# Patient Record
Sex: Female | Born: 1970 | Race: White | Hispanic: No | Marital: Single | Smoking: Current every day smoker
Health system: Southern US, Community
[De-identification: ages and names within clinical notes are randomized; demographics above are authoritative.]

## PROBLEM LIST (undated history)

## (undated) DIAGNOSIS — R454 Irritability and anger: Secondary | ICD-10-CM

## (undated) DIAGNOSIS — I351 Nonrheumatic aortic (valve) insufficiency: Secondary | ICD-10-CM

## (undated) DIAGNOSIS — K589 Irritable bowel syndrome without diarrhea: Secondary | ICD-10-CM

## (undated) DIAGNOSIS — E785 Hyperlipidemia, unspecified: Secondary | ICD-10-CM

## (undated) DIAGNOSIS — N760 Acute vaginitis: Secondary | ICD-10-CM

## (undated) DIAGNOSIS — D250 Submucous leiomyoma of uterus: Principal | ICD-10-CM

## (undated) DIAGNOSIS — F329 Major depressive disorder, single episode, unspecified: Secondary | ICD-10-CM

## (undated) DIAGNOSIS — D229 Melanocytic nevi, unspecified: Secondary | ICD-10-CM

## (undated) DIAGNOSIS — R519 Headache, unspecified: Secondary | ICD-10-CM

## (undated) DIAGNOSIS — R569 Unspecified convulsions: Secondary | ICD-10-CM

## (undated) DIAGNOSIS — F419 Anxiety disorder, unspecified: Secondary | ICD-10-CM

## (undated) DIAGNOSIS — F32A Depression, unspecified: Secondary | ICD-10-CM

## (undated) DIAGNOSIS — Z9289 Personal history of other medical treatment: Secondary | ICD-10-CM

## (undated) DIAGNOSIS — M542 Cervicalgia: Secondary | ICD-10-CM

## (undated) DIAGNOSIS — B9689 Other specified bacterial agents as the cause of diseases classified elsewhere: Secondary | ICD-10-CM

## (undated) DIAGNOSIS — Z9889 Other specified postprocedural states: Secondary | ICD-10-CM

## (undated) DIAGNOSIS — R110 Nausea: Secondary | ICD-10-CM

## (undated) DIAGNOSIS — K219 Gastro-esophageal reflux disease without esophagitis: Secondary | ICD-10-CM

## (undated) DIAGNOSIS — K529 Noninfective gastroenteritis and colitis, unspecified: Secondary | ICD-10-CM

## (undated) DIAGNOSIS — Z87442 Personal history of urinary calculi: Secondary | ICD-10-CM

## (undated) DIAGNOSIS — G8929 Other chronic pain: Secondary | ICD-10-CM

## (undated) DIAGNOSIS — R1031 Right lower quadrant pain: Principal | ICD-10-CM

## (undated) DIAGNOSIS — R197 Diarrhea, unspecified: Secondary | ICD-10-CM

## (undated) DIAGNOSIS — M549 Dorsalgia, unspecified: Secondary | ICD-10-CM

## (undated) DIAGNOSIS — R635 Abnormal weight gain: Secondary | ICD-10-CM

## (undated) DIAGNOSIS — R51 Headache: Secondary | ICD-10-CM

## (undated) DIAGNOSIS — J45909 Unspecified asthma, uncomplicated: Secondary | ICD-10-CM

## (undated) DIAGNOSIS — M1990 Unspecified osteoarthritis, unspecified site: Secondary | ICD-10-CM

## (undated) DIAGNOSIS — N898 Other specified noninflammatory disorders of vagina: Secondary | ICD-10-CM

## (undated) HISTORY — DX: Cervicalgia: M54.2

## (undated) HISTORY — DX: Gastro-esophageal reflux disease without esophagitis: K21.9

## (undated) HISTORY — DX: Acute vaginitis: N76.0

## (undated) HISTORY — DX: Dorsalgia, unspecified: M54.9

## (undated) HISTORY — PX: TUBAL LIGATION: SHX77

## (undated) HISTORY — DX: Other specified bacterial agents as the cause of diseases classified elsewhere: B96.89

## (undated) HISTORY — DX: Major depressive disorder, single episode, unspecified: F32.9

## (undated) HISTORY — DX: Abnormal weight gain: R63.5

## (undated) HISTORY — DX: Anxiety disorder, unspecified: F41.9

## (undated) HISTORY — DX: Hyperlipidemia, unspecified: E78.5

## (undated) HISTORY — DX: Irritability and anger: R45.4

## (undated) HISTORY — PX: CARDIAC CATHETERIZATION: SHX172

## (undated) HISTORY — DX: Melanocytic nevi, unspecified: D22.9

## (undated) HISTORY — DX: Diarrhea, unspecified: R19.7

## (undated) HISTORY — PX: BREAST ENHANCEMENT SURGERY: SHX7

## (undated) HISTORY — DX: Right lower quadrant pain: R10.31

## (undated) HISTORY — DX: Personal history of other medical treatment: Z92.89

## (undated) HISTORY — DX: Nausea: R11.0

## (undated) HISTORY — PX: KNEE SURGERY: SHX244

## (undated) HISTORY — DX: Unspecified osteoarthritis, unspecified site: M19.90

## (undated) HISTORY — DX: Submucous leiomyoma of uterus: D25.0

## (undated) HISTORY — DX: Noninfective gastroenteritis and colitis, unspecified: K52.9

## (undated) HISTORY — DX: Depression, unspecified: F32.A

## (undated) HISTORY — DX: Irritable bowel syndrome, unspecified: K58.9

## (undated) HISTORY — DX: Other specified noninflammatory disorders of vagina: N89.8

---

## 2001-03-13 ENCOUNTER — Other Ambulatory Visit: Admission: RE | Admit: 2001-03-13 | Discharge: 2001-03-13 | Payer: Self-pay

## 2003-11-04 ENCOUNTER — Emergency Department (HOSPITAL_COMMUNITY): Admission: EM | Admit: 2003-11-04 | Discharge: 2003-11-04 | Payer: Self-pay | Admitting: Emergency Medicine

## 2003-12-04 ENCOUNTER — Emergency Department (HOSPITAL_COMMUNITY): Admission: EM | Admit: 2003-12-04 | Discharge: 2003-12-04 | Payer: Self-pay | Admitting: Emergency Medicine

## 2004-04-23 ENCOUNTER — Emergency Department (HOSPITAL_COMMUNITY): Admission: EM | Admit: 2004-04-23 | Discharge: 2004-04-23 | Payer: Self-pay | Admitting: Emergency Medicine

## 2004-12-05 DIAGNOSIS — Z9889 Other specified postprocedural states: Secondary | ICD-10-CM

## 2004-12-05 DIAGNOSIS — I351 Nonrheumatic aortic (valve) insufficiency: Secondary | ICD-10-CM

## 2004-12-05 HISTORY — DX: Nonrheumatic aortic (valve) insufficiency: I35.1

## 2004-12-05 HISTORY — DX: Other specified postprocedural states: Z98.890

## 2005-05-23 ENCOUNTER — Emergency Department (HOSPITAL_COMMUNITY): Admission: EM | Admit: 2005-05-23 | Discharge: 2005-05-23 | Payer: Self-pay | Admitting: Emergency Medicine

## 2005-07-26 ENCOUNTER — Ambulatory Visit (HOSPITAL_COMMUNITY): Admission: RE | Admit: 2005-07-26 | Discharge: 2005-07-26 | Payer: Self-pay | Admitting: Preventative Medicine

## 2005-08-29 ENCOUNTER — Observation Stay (HOSPITAL_COMMUNITY): Admission: EM | Admit: 2005-08-29 | Discharge: 2005-08-30 | Payer: Self-pay | Admitting: Emergency Medicine

## 2005-09-12 ENCOUNTER — Ambulatory Visit: Payer: Self-pay | Admitting: Family Medicine

## 2005-09-28 ENCOUNTER — Ambulatory Visit (HOSPITAL_COMMUNITY): Admission: RE | Admit: 2005-09-28 | Discharge: 2005-09-28 | Payer: Self-pay | Admitting: *Deleted

## 2005-09-30 ENCOUNTER — Ambulatory Visit: Payer: Self-pay | Admitting: Family Medicine

## 2005-12-02 ENCOUNTER — Ambulatory Visit: Payer: Self-pay | Admitting: Family Medicine

## 2006-01-01 ENCOUNTER — Emergency Department (HOSPITAL_COMMUNITY): Admission: EM | Admit: 2006-01-01 | Discharge: 2006-01-01 | Payer: Self-pay | Admitting: Emergency Medicine

## 2006-01-11 ENCOUNTER — Ambulatory Visit (HOSPITAL_COMMUNITY): Admission: AD | Admit: 2006-01-11 | Discharge: 2006-01-12 | Payer: Self-pay | Admitting: Obstetrics and Gynecology

## 2006-02-26 ENCOUNTER — Ambulatory Visit (HOSPITAL_COMMUNITY): Admission: AD | Admit: 2006-02-26 | Discharge: 2006-02-26 | Payer: Self-pay | Admitting: Obstetrics and Gynecology

## 2006-05-17 ENCOUNTER — Ambulatory Visit (HOSPITAL_COMMUNITY): Admission: AD | Admit: 2006-05-17 | Discharge: 2006-05-17 | Payer: Self-pay | Admitting: Obstetrics and Gynecology

## 2006-06-06 ENCOUNTER — Ambulatory Visit (HOSPITAL_COMMUNITY): Admission: RE | Admit: 2006-06-06 | Discharge: 2006-06-06 | Payer: Self-pay | Admitting: Obstetrics and Gynecology

## 2006-06-30 ENCOUNTER — Observation Stay (HOSPITAL_COMMUNITY): Admission: AD | Admit: 2006-06-30 | Discharge: 2006-07-01 | Payer: Self-pay | Admitting: Obstetrics and Gynecology

## 2006-07-04 ENCOUNTER — Ambulatory Visit (HOSPITAL_COMMUNITY): Admission: AD | Admit: 2006-07-04 | Discharge: 2006-07-04 | Payer: Self-pay | Admitting: Obstetrics and Gynecology

## 2006-07-07 ENCOUNTER — Ambulatory Visit (HOSPITAL_COMMUNITY): Admission: AD | Admit: 2006-07-07 | Discharge: 2006-07-07 | Payer: Self-pay | Admitting: Obstetrics and Gynecology

## 2006-07-27 ENCOUNTER — Inpatient Hospital Stay (HOSPITAL_COMMUNITY): Admission: RE | Admit: 2006-07-27 | Discharge: 2006-07-29 | Payer: Self-pay | Admitting: Obstetrics and Gynecology

## 2007-04-23 ENCOUNTER — Ambulatory Visit: Payer: Self-pay | Admitting: Family Medicine

## 2007-07-31 ENCOUNTER — Ambulatory Visit (HOSPITAL_COMMUNITY): Admission: RE | Admit: 2007-07-31 | Discharge: 2007-07-31 | Payer: Self-pay | Admitting: Obstetrics and Gynecology

## 2007-08-23 ENCOUNTER — Ambulatory Visit (HOSPITAL_COMMUNITY): Admission: RE | Admit: 2007-08-23 | Discharge: 2007-08-23 | Payer: Self-pay | Admitting: Obstetrics and Gynecology

## 2007-10-24 ENCOUNTER — Emergency Department (HOSPITAL_COMMUNITY): Admission: EM | Admit: 2007-10-24 | Discharge: 2007-10-24 | Payer: Self-pay | Admitting: Emergency Medicine

## 2008-07-05 HISTORY — PX: TRANSTHORACIC ECHOCARDIOGRAM: SHX275

## 2008-07-15 DIAGNOSIS — Z9289 Personal history of other medical treatment: Secondary | ICD-10-CM

## 2008-07-15 HISTORY — DX: Personal history of other medical treatment: Z92.89

## 2009-04-08 ENCOUNTER — Other Ambulatory Visit: Admission: RE | Admit: 2009-04-08 | Discharge: 2009-04-08 | Payer: Self-pay | Admitting: Obstetrics and Gynecology

## 2009-05-06 ENCOUNTER — Ambulatory Visit (HOSPITAL_COMMUNITY): Admission: RE | Admit: 2009-05-06 | Discharge: 2009-05-06 | Payer: Self-pay | Admitting: Family Medicine

## 2009-06-05 ENCOUNTER — Ambulatory Visit (HOSPITAL_BASED_OUTPATIENT_CLINIC_OR_DEPARTMENT_OTHER): Admission: RE | Admit: 2009-06-05 | Discharge: 2009-06-05 | Payer: Self-pay | Admitting: Urology

## 2009-06-05 ENCOUNTER — Encounter (INDEPENDENT_AMBULATORY_CARE_PROVIDER_SITE_OTHER): Payer: Self-pay | Admitting: Urology

## 2011-03-05 ENCOUNTER — Emergency Department (HOSPITAL_COMMUNITY): Payer: Medicaid Other

## 2011-03-05 ENCOUNTER — Encounter (HOSPITAL_COMMUNITY): Payer: Self-pay | Admitting: Radiology

## 2011-03-05 ENCOUNTER — Emergency Department (HOSPITAL_COMMUNITY)
Admission: EM | Admit: 2011-03-05 | Discharge: 2011-03-06 | Disposition: A | Discharge status: Home / Self Care | Payer: Medicaid Other | Attending: Emergency Medicine | Admitting: Emergency Medicine

## 2011-03-05 DIAGNOSIS — N83209 Unspecified ovarian cyst, unspecified side: Secondary | ICD-10-CM | POA: Insufficient documentation

## 2011-03-05 DIAGNOSIS — K625 Hemorrhage of anus and rectum: Secondary | ICD-10-CM | POA: Insufficient documentation

## 2011-03-05 DIAGNOSIS — K219 Gastro-esophageal reflux disease without esophagitis: Secondary | ICD-10-CM | POA: Insufficient documentation

## 2011-03-05 DIAGNOSIS — R110 Nausea: Secondary | ICD-10-CM | POA: Insufficient documentation

## 2011-03-05 DIAGNOSIS — Z79899 Other long term (current) drug therapy: Secondary | ICD-10-CM | POA: Insufficient documentation

## 2011-03-05 DIAGNOSIS — R109 Unspecified abdominal pain: Secondary | ICD-10-CM | POA: Insufficient documentation

## 2011-03-05 LAB — PROTIME-INR: Prothrombin Time: 13.9 seconds (ref 11.6–15.2)

## 2011-03-05 LAB — URINALYSIS, ROUTINE W REFLEX MICROSCOPIC
Bilirubin Urine: NEGATIVE
Nitrite: NEGATIVE
Specific Gravity, Urine: 1.017 (ref 1.005–1.030)
pH: 6 (ref 5.0–8.0)

## 2011-03-05 LAB — CBC
HCT: 34.4 % — ABNORMAL LOW (ref 36.0–46.0)
MCH: 32.2 pg (ref 26.0–34.0)
MCHC: 34.3 g/dL (ref 30.0–36.0)
MCV: 93.7 fL (ref 78.0–100.0)
Platelets: 275 10*3/uL (ref 150–400)
RBC: 3.67 MIL/uL — ABNORMAL LOW (ref 3.87–5.11)
RDW: 12.6 % (ref 11.5–15.5)
WBC: 13.5 10*3/uL — ABNORMAL HIGH (ref 4.0–10.5)

## 2011-03-05 LAB — DIFFERENTIAL
Basophils Relative: 0 % (ref 0–1)
Lymphocytes Relative: 31 % (ref 12–46)
Lymphs Abs: 4.1 10*3/uL — ABNORMAL HIGH (ref 0.7–4.0)
Monocytes Absolute: 0.9 10*3/uL (ref 0.1–1.0)
Monocytes Relative: 7 % (ref 3–12)
Neutrophils Relative %: 60 % (ref 43–77)

## 2011-03-05 LAB — BASIC METABOLIC PANEL
BUN: 8 mg/dL (ref 6–23)
CO2: 21 mEq/L (ref 19–32)
Calcium: 8.3 mg/dL — ABNORMAL LOW (ref 8.4–10.5)
Creatinine, Ser: 0.75 mg/dL (ref 0.4–1.2)
GFR calc non Af Amer: >60 mL/min (ref >60)
Glucose, Bld: 92 mg/dL (ref 70–99)
Sodium: 134 mEq/L — ABNORMAL LOW (ref 135–145)

## 2011-03-05 LAB — HEPATIC FUNCTION PANEL
Indirect Bilirubin: 0.3 mg/dL (ref 0.3–0.9)
Total Bilirubin: 0.5 mg/dL (ref 0.3–1.2)

## 2011-03-05 LAB — APTT: aPTT: 29 seconds (ref 24–37)

## 2011-03-05 MED ORDER — IOHEXOL 300 MG/ML  SOLN
80.0000 mL | Freq: Once | INTRAMUSCULAR | Status: AC | PRN
  Administered 2011-03-05: 80 mL via INTRAVENOUS

## 2011-03-07 ENCOUNTER — Emergency Department (HOSPITAL_COMMUNITY)
Admission: EM | Admit: 2011-03-07 | Discharge: 2011-03-07 | Disposition: A | Discharge status: Home / Self Care | Payer: Medicaid Other | Attending: Emergency Medicine | Admitting: Emergency Medicine

## 2011-03-07 ENCOUNTER — Emergency Department (HOSPITAL_COMMUNITY): Payer: Medicaid Other

## 2011-03-07 DIAGNOSIS — K219 Gastro-esophageal reflux disease without esophagitis: Secondary | ICD-10-CM | POA: Insufficient documentation

## 2011-03-07 DIAGNOSIS — Z79899 Other long term (current) drug therapy: Secondary | ICD-10-CM | POA: Insufficient documentation

## 2011-03-07 DIAGNOSIS — R143 Flatulence: Secondary | ICD-10-CM | POA: Insufficient documentation

## 2011-03-07 DIAGNOSIS — R142 Eructation: Secondary | ICD-10-CM | POA: Insufficient documentation

## 2011-03-07 DIAGNOSIS — R141 Gas pain: Secondary | ICD-10-CM | POA: Insufficient documentation

## 2011-03-07 DIAGNOSIS — K602 Anal fissure, unspecified: Secondary | ICD-10-CM | POA: Insufficient documentation

## 2011-03-07 DIAGNOSIS — K625 Hemorrhage of anus and rectum: Secondary | ICD-10-CM | POA: Insufficient documentation

## 2011-03-07 DIAGNOSIS — R1032 Left lower quadrant pain: Secondary | ICD-10-CM | POA: Insufficient documentation

## 2011-03-07 DIAGNOSIS — F411 Generalized anxiety disorder: Secondary | ICD-10-CM | POA: Insufficient documentation

## 2011-03-07 DIAGNOSIS — K921 Melena: Secondary | ICD-10-CM | POA: Insufficient documentation

## 2011-03-07 DIAGNOSIS — D649 Anemia, unspecified: Secondary | ICD-10-CM | POA: Insufficient documentation

## 2011-03-07 DIAGNOSIS — K644 Residual hemorrhoidal skin tags: Secondary | ICD-10-CM | POA: Insufficient documentation

## 2011-03-07 LAB — CBC
HCT: 35.2 % — ABNORMAL LOW (ref 36.0–46.0)
Hemoglobin: 11.8 g/dL — ABNORMAL LOW (ref 12.0–15.0)
MCH: 32.1 pg (ref 26.0–34.0)
MCHC: 33.5 g/dL (ref 30.0–36.0)
MCV: 95.7 fL (ref 78.0–100.0)
RBC: 3.68 MIL/uL — ABNORMAL LOW (ref 3.87–5.11)

## 2011-03-07 LAB — COMPREHENSIVE METABOLIC PANEL
ALT: 17 U/L (ref 0–35)
Albumin: 3.4 g/dL — ABNORMAL LOW (ref 3.5–5.2)
Alkaline Phosphatase: 51 U/L (ref 39–117)
Calcium: 8.4 mg/dL (ref 8.4–10.5)
GFR calc Af Amer: >60 mL/min (ref >60)
Glucose, Bld: 100 mg/dL — ABNORMAL HIGH (ref 70–99)
Potassium: 4.4 mEq/L (ref 3.5–5.1)
Sodium: 140 mEq/L (ref 135–145)
Total Protein: 6.2 g/dL (ref 6.0–8.3)

## 2011-03-07 LAB — APTT: aPTT: 28 seconds (ref 24–37)

## 2011-03-07 LAB — DIFFERENTIAL
Basophils Relative: 0 % (ref 0–1)
Lymphs Abs: 3.5 10*3/uL (ref 0.7–4.0)
Monocytes Absolute: 0.8 10*3/uL (ref 0.1–1.0)
Monocytes Relative: 7 % (ref 3–12)
Neutro Abs: 7.4 10*3/uL (ref 1.7–7.7)
Neutrophils Relative %: 59 % (ref 43–77)

## 2011-03-07 LAB — PROTIME-INR
INR: 0.89 (ref 0.00–1.49)
Prothrombin Time: 12.2 seconds (ref 11.6–15.2)

## 2011-03-07 LAB — OCCULT BLOOD, POC DEVICE: Fecal Occult Bld: NEGATIVE

## 2011-03-07 LAB — TYPE AND SCREEN: ABO/RH(D): O POS

## 2011-03-22 ENCOUNTER — Ambulatory Visit: Payer: Medicaid Other | Admitting: Physical Therapy

## 2011-03-29 ENCOUNTER — Ambulatory Visit: Payer: Medicaid Other | Attending: Neurosurgery | Admitting: Physical Therapy

## 2011-03-29 DIAGNOSIS — R5381 Other malaise: Secondary | ICD-10-CM | POA: Insufficient documentation

## 2011-03-29 DIAGNOSIS — M545 Low back pain, unspecified: Secondary | ICD-10-CM | POA: Insufficient documentation

## 2011-03-29 DIAGNOSIS — IMO0001 Reserved for inherently not codable concepts without codable children: Secondary | ICD-10-CM | POA: Insufficient documentation

## 2011-03-29 DIAGNOSIS — R293 Abnormal posture: Secondary | ICD-10-CM | POA: Insufficient documentation

## 2011-03-31 ENCOUNTER — Ambulatory Visit: Payer: Medicaid Other | Admitting: Physical Therapy

## 2011-04-14 ENCOUNTER — Ambulatory Visit: Payer: Medicaid Other | Attending: Neurosurgery | Admitting: Physical Therapy

## 2011-04-14 DIAGNOSIS — M545 Low back pain, unspecified: Secondary | ICD-10-CM | POA: Insufficient documentation

## 2011-04-14 DIAGNOSIS — IMO0001 Reserved for inherently not codable concepts without codable children: Secondary | ICD-10-CM | POA: Insufficient documentation

## 2011-04-14 DIAGNOSIS — R293 Abnormal posture: Secondary | ICD-10-CM | POA: Insufficient documentation

## 2011-04-14 DIAGNOSIS — R5381 Other malaise: Secondary | ICD-10-CM | POA: Insufficient documentation

## 2011-04-19 ENCOUNTER — Ambulatory Visit: Payer: Medicaid Other | Admitting: Physical Therapy

## 2011-04-19 NOTE — Op Note (Signed)
Julie [name], Julie [name]               ACCOUNT NO.:  [id number]   MEDICAL RECORD NO.:  [id number]          PATIENT TYPE:  AMB   LOCATION:  DAY                           FACILITY:  [facility]   PHYSICIAN:  Tilda Burrow, M.D. DATE OF BIRTH:  09-May-1971   DATE OF PROCEDURE:  08/23/2007  DATE OF DISCHARGE:                               OPERATIVE REPORT   PROCEDURE:  Laparoscopic tubal sterilization with Falope rings.   SURGEON:  Tilda Burrow, M.D.   ASSISTANTAmie Critchley, CST.   ANESTHESIA:  General.   COMPLICATIONS:  None.   FINDINGS:  Thin fibrinous material around each tube and ovary suggestive  of old inflammatory process, not currently active.  Retraction of the  uterus on the left side from old surgical scars from her C-section.  Otherwise normal.   DETAILS OF PROCEDURE:  The patient was taken to the operating room,  prepped and draped in the usual standard fashion with legs in low  lithotomy leg supports after general anesthesia was introduced without  difficulty.  The bladder was in-and-out catheterized and Hulka tenaculum  attached to the cervix for uterine manipulation.  An infraumbilical,  vertical, 1-cm skin incision was made as well as a transverse suprapubic  1-cm incision.  A Veress needle was used to achieve pneumoperitoneum  through the umbilical incision while being careful to orient the needle  toward the pelvis while elevating the abdominal wall by manual  elevation.  Water droplet test was used to confirm intraperitoneal  placement.   Pneumoperitoneum was achieved easily under 8-to-10 mm of intra-abdominal  pressure; and the laparoscopic trocar was introduced, a 5-mm blunt  tipped trocar, under direct visualization using the video camera.  Peritoneal cavity was entered without difficulty.  Inspection of the  anterior surfaces of the abdominal contents showed no evidence of injury  or bleeding.  Attention was directed to the pelvis.  Findings were as  described above.   Attention was first directed to the left fallopian tube which was  elevated, identified to its fimbriated end and grasped in its midportion  with Falope ring applier.  Falope ring applied and then the tube  infiltrated with Marcaine solution 0.25% using a 22-gauge spinal needle  percutaneously applied.   Attention was then directed to the right fallopian tube where a similar  procedure was performed.  Photo documentation of the ring placements was  performed; 120 cc of saline was instilled into the abdomen; deflation of  CO2 performed; instruments removed and subcuticular 4-0 Dexon closure of  skin incisions performed.  The rest of the surgical instruments were  removed; Steri-Strips placed.  The patient allowed to awaken and go to  recovery room in standard fashion.      Tilda Burrow, M.D.  Electronically Signed     JVF/MEDQ  D:  08/23/2007  T:  08/23/2007  Job:  [id number]

## 2011-04-19 NOTE — Op Note (Signed)
[name], Julie               ACCOUNT NO.:  [id number]   MEDICAL RECORD NO.:  [id number]          PATIENT TYPE:  AMB   LOCATION:  NESC                         FACILITY:  [facility]   PHYSICIAN:  Sigmund I. Patsi Sears, M.D.DATE OF BIRTH:  1971/09/14   DATE OF PROCEDURE:  06/05/2009  DATE OF DISCHARGE:                               OPERATIVE REPORT   PREOPERATIVE DIAGNOSIS:  IC (interstitial cystitis).   POSTOPERATIVE DIAGNOSIS:  IC (interstitial cystitis).   PROCEDURE:  Cystourethroscopy, hydrodistention of bladder (950 mL), cold  cup bladder biopsy with cauterization of biopsy sites and installation  of Pyridium/Marcaine in the bladder with injection of Marcaine/Kenalog  in the subtrigonal space.   SURGEON:  Sigmund I. Patsi Sears, M.D.   ANESTHESIA:  General LMA, plus IV Toradol, plus B and O suppository.   PREPARATION:  After appropriate preanesthesia the patient was brought to  the operating room and placed on the operating room in the dorsal supine  position where general LMA anesthesia was induced.  She was then  replaced in dorsal lithotomy position when the pubis was prepped with  Betadine solution and draped in the usual fashion.   REVIEW OF HISTORY:  Julie [name] is a 40 year old female, referred for  evaluation of continued right lower quadrant pain despite negative  evaluation.  She has had pain since 2008.  In the past, she has had 2 to  3 mm stone with mild hydronephrosis and right ovarian cyst, but recent  CT shows no stone and minimal cyst.  There is no hydronephrosis noted.  She complains of urinary urgency, occasional frequency.  The patient has  a daughter with Crohn's disease and she herself has irritable bowel  syndrome (? Crohn's).  She is now for bladder evaluation for IC.   PROCEDURE IN DETAIL:  Cystourethroscopy shows a 950 mL bladder.  I do  not see ulcerations within the bladder.  Photo documentation was  accomplished.  Cold cup bladder biopsies were taken  and the biopsy sites  are cauterized.  Marcaine/ Pyridium solution was inserted in the  bladder, and Marcaine/Kenalog solution was injected into the subtrigonal  space.  The patient was given IV Toradol, B and O suppository, awakened  and taken to the recovery room in good condition.      Sigmund I. Patsi Sears, M.D.  Electronically Signed     SIT/MEDQ  D:  06/05/2009  T:  06/05/2009  Job:  [id number]

## 2011-04-19 NOTE — H&P (Signed)
Julie [name], Julie [name]               ACCOUNT NO.:  [id number]   MEDICAL RECORD NO.:  [id number]          PATIENT TYPE:  AMB   LOCATION:  DAY                           FACILITY:  [facility]   PHYSICIAN:  Tilda Burrow, M.D. DATE OF BIRTH:  [birth year]   DATE OF ADMISSION:  07/31/2007  DATE OF DISCHARGE:  08/26/2008LH                              HISTORY & PHYSICAL   ADMISSION DIAGNOSIS:  Desire for elective permanent sterilization.   HISTORY OF PRESENT ILLNESS:  This 40 year old female gravida 6, para 4,  status post C-section 2007, desires permanent sterilization.  She signed  Medicaid tubal sterilization forms in July, and returns for scheduling  of procedure.  It was previously scheduled and had to be rescheduled due  to patient's family concerns.  She  could not _keep scheduled surgery  date due to  __  the illness of her 40-year-old.  She was admitted for  __same-__ day surgery for laparoscopic tubal sterilization which has  been reviewed and questions answered.  Technical failure rate, technical  aspects of the procedure have been reviewed, and success rate of 99 out  of 100 reviewed.   PAST MEDICAL HISTORY:  Notable for occasional irregular heart rate.  She  has allergies to PENICILLIN which is a childhood allergy.  She has  bilateral breast complaints.  She has had D&C and C-section x2.   SOCIAL HISTORY:  Cigarettes one pack per day.  Alcohol denied.   PHYSICAL EXAMINATION:  GENERAL:  Exam shows a slim, petite athletic  Caucasian female.  VITAL SIGNS:  Weight 120, height 5 feet even.  Blood pressure 110/60,  pulse 70s.  HEENT:  Pupils equal, round and reactive.  NECK:  Supple.  Trachea is midline.  CHEST: Clear to auscultation.  ABDOMEN:  Nontender with well-healed surgical scar.  EXTERNAL GENITALIA: Multiparous.   PLAN:  LPS, fallopian rings, August 23, 2007.      Tilda Burrow, M.D.  Electronically Signed     JVF/MEDQ  D:  08/16/2007  T:  08/17/2007  Job:   [id number]

## 2011-04-22 NOTE — H&P (Signed)
NAMESRI, CLEGG               ACCOUNT NO.:  [id number]   MEDICAL RECORD NO.:  [id number]          PATIENT TYPE:  INP   LOCATION:  A206                          FACILITY:  [facility]   PHYSICIAN:  Calvert Cantor, M.D.     DATE OF BIRTH:  06/15/1971   DATE OF ADMISSION:  08/29/2005  DATE OF DISCHARGE:  LH                                HISTORY & PHYSICAL   ADMISSION DIAGNOSIS:  Chest pain and shortness of breath.   HISTORY OF PRESENT ILLNESS:  This is a 40 year old white female with a past  medical history of anxiety disorder and GERD who is complaining of pain in  the left side of her chest and shortness of breath on exertion.  In  addition, she states that she has had a cough for the past two days and this  morning she had an episode of hemoptysis.  The patient states that her chest  pain has been continuous since this morning.  It is an ache which is about  8/10.  It is not producible with pressure.  It does not radiate to her arms  or her shoulders and it is not associated with any diaphoresis or  palpitations.  There are no aggravating or relieving factors.  In addition,  she states she becomes mildly short of breath when she gets up and walks  around.  She does not feel short of breath at rest.  Her cough again has  been for the past two days.  She has not been coughing up any sputum.  It is  worse at night sometimes and she has never had any other episodes of  hemoptysis in the past.   REVIEW OF SYSTEMS:  Negative for fever, sore throat, orthopnea, paroxysmal  nocturnal dyspnea, abdominal pain, dysuria, diarrhea, night sweats,  weakness, weight loss or poor appetite, in fact the patient states that she  has a very good appetite.   PAST MEDICAL HISTORY:  1.  Gastroesophageal reflux disease.  2.  Anxiety.   CURRENT MEDICATIONS:  1.  Prevacid one tab daily.  2.  BuSpar 15 mg b.i.d., sometimes t.i.d.   ALLERGIES:  PENICILLIN HAS CAUSED HER TO PASS OUT.   SOCIAL HISTORY:  She  smokes one pack per day since she was 40 years old.  She drinks 5-6 beers on the weekend.  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  She has three children who are alive and healthy.   PAST SURGICAL HISTORY:  She has had breast implants.   FAMILY HISTORY:  Her mother is alive with no significant medical disease.  Her father is also alive with a history of diverticulitis.   PHYSICAL EXAMINATION:  VITAL SIGNS:  Temperature 97.4, blood pressure  117/60, pulse 60, respiratory rate 18.  Pulse oximetry is 100% on two liters  of O2.  HEENT:  Atraumatic, normocephalic.  Pupils equal, round, and reactive to  light.  Oral mucosa is moist.  There is no erythema.  There are no lesions.  NECK:  Supple.  No lymphadenopathy.  HEART:  Regular rate and rhythm.  No murmurs.  LUNGS:  Clear bilaterally.  ABDOMEN:  Soft, nontender, nondistended, bowel sounds are positive.  EXTREMITIES:  Show no clubbing, cyanosis, or edema.   LABORATORY DATA:  WBC count is 11.1, hemoglobin 12.8, hematocrit 37.3, MCV  92.9, platelets 368.  CK-MB is less than 1.0, troponin's are less than 0.05,  myoglobin is 31.8.  Sodium 133, potassium 3.6, chloride 104, bicarbonate 24,  glucose 98, BUN 5, creatinine 0.8, AST 26, ALT 18, alkaline phosphatase 64,  total bilirubin 1.3, total protein 6.7, calcium 8.8.   Chest x-ray:  Shows no cardiopulmonary process.  CT of the chest to rule out PE done in the ER: no evidence of a PE of any  other acute thoracic process.  There is a tiny nodular density in the  superior aspect of the left major fissure which is nonspecific.  There are  also minimal post inflammatory changes of the upper lobes.   EKG is showing normal sinus rhythm at 60 beats per minute.  There are no ST  or T-wave changes.  There are no Q-waves.   SUMMARY:  This is a 40 year old white female with a history of shortness of  breath and chest pain.  Cardiac enzymes will be done to rule out an MI.  Most likely  this is secondary to anxiety.  The patient will be placed on her  home medications.  In addition she can have Xanax p.r.n.   The patient has a history of hemoptysis.  Currently she does not appear to  have a PE, pneumonia or any masses in her lungs.  She seems to have post  inflammatory changes of the upper lobes.  Although she does not complain of  any fevers or weight loss, I will place a PPD to rule out TB.  I have told  the patient that if the hemoptysis continues she will probably need further  workup as an outpatient.      Calvert Cantor, M.D.  Electronically Signed     SR/MEDQ  D:  08/30/2005  T:  08/30/2005  Job:  [id number]

## 2011-04-22 NOTE — Consult Note (Signed)
[name], [name] NO.:  [id number]   MEDICAL RECORD NO.:  [id number]          PATIENT TYPE:  OIB   LOCATION:  LDR2                          FACILITY:  [facility]   PHYSICIAN:  Lazaro Arms, M.D.   DATE OF BIRTH:  18-Oct-1971   DATE OF CONSULTATION:  DATE OF DISCHARGE:  07/01/2006                                   CONSULTATION   HISTORY OF PRESENT ILLNESS:  Julie [name] is a 40 year old white female, gravida 6,  para 3, abortus 2, estimated date of delivery of August 11, 2006,  currently at [redacted] weeks gestation, who has had an unremarkable pregnancy.  Of  note, she does have a history of a 40 to 50% ejection fraction with mild  aortic insufficiency back in 2004, felt to be of no significant clinical  consequence except for prophylaxis during delivery.  She has a previous  cesarean section and we will proceed with a repeat cesarean section.  She  came into labor and delivery complaining of regular uterine contractions.  She mostly had uterine irritability with occasional stronger contractions.  Her cervix was basically long, thick, and closed from the office, and she  has been observed now for 14 to 15 hours with no change in the cervix.  She  had one dose of subcutaneous terbutaline, and she had significant __________  response to that, and then subsequently has had nothing else.  She had  Ambien for sleep.  This morning, I am going to give her a 2.5 mg of  terbutaline and send her home, and an as needed terbutaline of 2.5 mg, note  to be no more frequent than six hours.  She is given other instructions and  precautions to return, and she will be seen in the office per routine.  She  had a reactive NST.      Lazaro Arms, M.D.  Electronically Signed     LHE/MEDQ  D:  07/01/2006  T:  07/01/2006  Job:  [id number]

## 2011-04-22 NOTE — Procedures (Signed)
NAMEMALISSA, Julie [name]               ACCOUNT NO.:  [id number]   MEDICAL RECORD NO.:  [id number]          PATIENT TYPE:  OBV   LOCATION:  A206                          FACILITY:  [facility]   PHYSICIAN:  Dani Gobble, MD       DATE OF BIRTH:  October 14, 1971   DATE OF PROCEDURE:  08/30/2005  DATE OF DISCHARGE:  08/30/2005                                  ECHOCARDIOGRAM   REFERRING:  Dr. Butler Denmark and Dr. Domingo Sep.   INDICATIONS:  Chest pain.   Technical quality of this study is somewhat limited secondary to patient  body habitus and breast implants.   The aorta is measured within normal limits at 2.3 cm.   The left atrium is also within normal limits measured at 3.1 cm. The patient  appeared in sinus rhythm during this procedure. No obvious clots or masses  were appreciated.   The interventricular septum and posterior wall were within normal limits of  thickness at 1.0 cm for each.   The aortic valve was not well visualized but appeared to be grossly  structurally normal with normal leaflet excursion. In one view only (April  5), there was a subtle suggestion of either trivial eccentric jet of  __________ versus a small shunt. This was not well visualized. Doppler  interrogation of the aortic valve was within normal limits.   The mitral valve appears structurally normal. No mitral valve prolapse is  noted. Mild mitral regurgitation was noted. Doppler interrogation of the  mitral valve was within normal limits.   Pulmonic valve was not well visualized, but mild pulmonic insufficiency was  noted.   Tricuspid valve appeared grossly structurally normal with trivial tricuspid  regurgitation noted.   Left ventricle is normal in size. Overall left systolic function appeared to  be mildly diminished with an estimated ejection fraction of 45-50%. There  appeared to be mild global hypokinesis, although at times the anterior  septal wall appeared to be more hypokinetic than the remaining walls;  this  was not appreciated in all views.   There was no evidence for diastolic dysfunction on the inflow signal.   The right atrium and right ventricle normal in size and right ventricle  systolic function is normal.   IMPRESSION:  1.  Technically limited study secondary to patient body habitus and the      presence of breast implants.  2.  The aortic valve was not well visualized but appeared to be grossly      structurally normal.  3.  In one view only (apical five), there appeared to be a trivial eccentric      jet of aortic insufficiency, but the possibility that this was an aortic      fistula or shunt cannot be entirely excluded on this study.  4.  Mild mitral regurgitation.  5.  Trivial tricuspid regurgitation.  6.  Mild mild pulmonic insufficiency.  7.  Normal left ventricular size with mildly diminished ejection fraction      estimated 45-50%. There was mild global hypokinesis, although at times      the anterior septal wall  appeared to be more hypokinetic than the      remaining walls.  8.  Consider transesophageal echocardiogram if clinically indicated.           ______________________________  Dani Gobble, MD     AB/MEDQ  D:  08/30/2005  T:  08/31/2005  Job:  [id number]

## 2011-04-22 NOTE — Cardiovascular Report (Signed)
NAMESHANIKIA, KERNODLE NO.:  [id number]   MEDICAL RECORD NO.:  [id number]          PATIENT TYPE:  OIB   LOCATION:  2899                         FACILITY:  [facility]   PHYSICIAN:  Darlin Priestly, MD  DATE OF BIRTH:  1971/11/23   DATE OF PROCEDURE:  09/28/2005  DATE OF DISCHARGE:  09/28/2005                              CARDIAC CATHETERIZATION   PROCEDURE:  1.  Left heart catheterization.  2.  Coronary angiography.  3.  Left ventriculogram.  4.  Ascending aortography.   ATTENDING PHYSICIAN:  Darlin Priestly, M.D.   COMPLICATIONS:  None.   INDICATIONS FOR PROCEDURE:  Ms. Bixler is a 40 year old female patient of  Dr. Early Chars, Dr. Wende Crease, and Dr. Domingo Sep with a history of anxiety disorder,  history of gastroesophageal reflux disease, and ongoing chest pain.  She did  have a Cardiolite scan revealing increased good uptake as well as anterior  wall attenuation secondary to breast implants, but no significant ischemia.  She also had an echocardiogram revealing mild depressed EF with probable  aortic insufficiency.  Because of her ongoing symptoms, she is now referred  for cardiac catheterization to rule out significant CAD.   DESCRIPTION OF PROCEDURE:  After giving informed written consent, the  patient was brought to the cardiac cath lab were the right and left groin  were shaved, prepped and draped in a sterile fashion.  ECG monitoring was  established.  Using modified Seldinger technique, a #6 French arterial  sheath was inserted into the right femoral artery.  A 6 French diagnostic  catheter was used to perform diagnostic angiography.   The left main is a large vessel with no significant disease.   The LAD is a large vessel coursing to the apex and gives rise two diagonal  branches.  The LAD has no significant disease.  The first and second  diagonals are medium size vessels with no significant disease.   The left circumflex courses to the AV groove and  gives rise to one obtuse  marginal branch.  The circumflex has no significant disease.   The first obtuse marginal is a medium vessel that bifurcates distally with  no significant disease.   The right coronary artery is a large vessel and dominant and gives rise to a  PDA as well as posterolateral branch.  There is mild spasm noted in the  proximal portion of the RCA but no significant disease.  The PDA and  posterolateral branch have no significant disease.   Left ventriculogram reveals mild to moderate depressed EF of 35-40% with  global hypokinesis.   The ascending aortography reveals normal ascending aorta, though she does  appear to have mild to moderate aortic insufficiency.   HEMODYNAMICS:  Systemic arterial pressure 102/68, LV systemic pressure 98/3,  LVEDP 7.   CONCLUSION:  1.  No significant CAD.  2.  Mild to moderately depressed LV systolic function.  3.  Mild to moderate aortic insufficiency.      Darlin Priestly, MD  Electronically Signed     RHM/MEDQ  D:  09/28/2005  T:  09/28/2005  Job:  [id number]   cc:   Dorthula Rue. Early Chars, MD  Fax: [phone]   Laverle Hobby, M.D.  [address]  [place], [place] [address]   Dani Gobble, MD  Fax: [phone]

## 2011-04-22 NOTE — H&P (Signed)
Julie [name], Julie [name]               ACCOUNT NO.:  [id number]   MEDICAL RECORD NO.:  [id number]          PATIENT TYPE:  AMB   LOCATION:  DAY                           FACILITY:  [facility]   PHYSICIAN:  Tilda Burrow, M.D. DATE OF BIRTH:  12/27/1970   DATE OF ADMISSION:  07/27/2006  DATE OF DISCHARGE:  LH                                HISTORY & PHYSICAL   ADMISSION DIAGNOSES:  1. Pregnancy, [redacted] weeks gestation, repeat cesarean section, not for trial      of labor.  2. History of mild tricuspid regurgitation, for IV antibiotic prophylaxis      at surgery.   HISTORY OF PRESENT ILLNESS:  This pleasant 40 year old female, gravida 6,  para 3, AB 2, with three living children, who is admitted at this time for a  repeat cesarean section.  Julie [name] has been followed through the pregnancy with  lots of discomfort and pelvic pressure, tolerating poorly by the patient.  She was originally scheduled for surgery on July 31, 2006 but due to the  patient's emphatic concerns over discomfort, pressure, and family, she is  going to be going into labor at any point.  She has been moved up to [redacted]  weeks gestation.  She understands that not waiting until 39 weeks increases,  essentially doubling the small risk of the baby not breathing well at  delivery.  She acknowledges this, with this having been explained in detail  but emphatically wishes to proceed as soon as possible for cesarean  delivery.  The pregnancy course has been notable for past medical history of  GERD, anxiety and panic disorder.  She has had some chest discomfort which  has been evaluated in the past as well as during the pregnancy.  In October,  2006, she had angiography, revealing mild-to-moderate aortic insufficiency  with an ejection fraction at 35-40% with what was termed global  hypokinesis.  Six months later, echo was scheduled, at which time she was  pregnant.  This evaluation did not include an echocardiogram.  The plans are  for  echo to be repeated six months or so after delivery.  She was not  perceived to be a cardiac risk during the pregnancy.  She will receive IV  antibiotic prophylaxis.   PAST MEDICAL HISTORY:  Positive for irregular heartbeat, previous, as  evaluated and seen in the HPI.  Marland Kitchen   PAST SURGICAL HISTORY:  D&C, C-section, breast implants bilaterally.   ALLERGIES:  PENICILLIN.   SOCIAL HISTORY:  Single.  Works at [employer].   Physical exam shows a healthy Caucasian female, tolerating labor adequately.   PRENATAL LABS:  Blood type O+.  Rubella immune.  Hemoglobin stable.  Hepatitis, HIV, RPR, GC and Chlamydia all negative.  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  No current lesions noted.   PLAN:  Repeat cesarean section on July 27, 2006.  Will use antibiotic  prophylaxis after clamping of the cord.      Tilda Burrow, M.D.  Electronically Signed     JVF/MEDQ  D:  07/26/2006  T:  07/26/2006  Job:  [id number]  cc:   Francoise Schaumann. Milford Cage DO, FAAP  Fax: [phone]

## 2011-04-22 NOTE — Discharge Summary (Signed)
NAMEGWYNETH, Julie [name]               ACCOUNT NO.:  [id number]   MEDICAL RECORD NO.:  [id number]          PATIENT TYPE:  OIB   LOCATION:  A428                          FACILITY:  [facility]   PHYSICIAN:  Tilda Burrow, M.D. DATE OF BIRTH:  1971/08/23   DATE OF ADMISSION:  01/11/2006  DATE OF DISCHARGE:  02/08/2007LH                                 DISCHARGE SUMMARY   ADMISSION DIAGNOSES:  Pregnancy 9 weeks, 5 days gestation, right upper  quadrant pain x2 days.  Rule out appendicitis.   DISCHARGE DIAGNOSES:  Pregnancy 9 weeks, 5 days, stable.  No evidence of  appendicitis.  Possible mesenteric adenitis versus gastroenteritis.   PROCEDURES:  Consult with Elpidio Anis January 12, 2006.   DISCHARGE INSTRUCTIONS:  1.  Full liquids x24 hours.  2.  Tylenol #3 p.r.n. pain.  3.  Return call to [facility] for fever, return of abdominal pain,      change in status.   HOSPITAL SUMMARY:  This 40 year old female, gravida 6, para 3, at 9 weeks 5  days, admitted after being evaluated in the office with right lower quadrant  pain, nausea, loose stools but without diarrhea.  She had some right lower  quadrant pain, possibly representing McBurney's point.   HOSPITAL COURSE:  The patient was admitted with laboratory data initially  including white count 13,600, 66 neutrophils, 23 lymphs, hemoglobin 12,  hematocrit 34.  Ultrasound was performed which showed no ultrasound evidence  of appendicitis.  MRI was ordered by Dr. Katrinka [name] after consultation and  discussion with Julie [name] and Dr. Despina Hidden.  MRI was interpreted as  negative for any suggestion of appendicitis.  The patient was tolerating  liquids, requesting to go home by the evening of January 12, 2006, and was  discharged at that time.  Follow up four days [facility].      Tilda Burrow, M.D.  Electronically Signed    JVF/MEDQ  D:  01/12/2006  T:  01/13/2006  Job:  [id number]   cc:   Julie Dress. Julie [name], M.D.  Fax: [phone]   [facility]

## 2011-04-22 NOTE — Group Therapy Note (Signed)
Julie [name], Julie [name]               ACCOUNT NO.:  [id number]   MEDICAL RECORD NO.:  [id number]          PATIENT TYPE:  OIB   LOCATION:  A415                          FACILITY:  [facility]   PHYSICIAN:  Richardean Canal, M.D.  DATE OF BIRTH:  1971/10/22   DATE OF PROCEDURE:  07/07/2006  DATE OF DISCHARGE:  07/07/2006                                   PROGRESS NOTE   This patient was sent to labor and deliver from the office at [redacted] weeks  gestation with uterine contractions.  The patient has a history of a prior  cesarean section.  The patient was placed on a monitor in labor and delivery  where she was noted to have a reactive NST.  Contractions were occurring  ever 1.5 minutes.  Procardia 20 mg orally was prescribed and the  contractions abated.  The patient was seen following this and found to be  comfortable and ready for discharge home.   DIAGNOSES:  1.  Pregnancy 34 weeks.  2.  History of prior cesarean section.  3.  False labor.   DISPOSITION:  1.  Discharged home.  2.  Procardia 10 mg, number 28, to be taken 1 p.r.n. contractions not to      exceed four per day.           ______________________________  Richardean Canal, M.D.     RW/MEDQ  D:  07/07/2006  T:  07/08/2006  Job:  [id number]

## 2011-04-22 NOTE — Group Therapy Note (Signed)
NAMEIRINE, HEMINGER               ACCOUNT NO.:  [id number]   MEDICAL RECORD NO.:  [id number]          PATIENT TYPE:  OIB   LOCATION:  A415                          FACILITY:  [facility]   PHYSICIAN:  Tilda Burrow, M.D. DATE OF BIRTH:  1971/06/16   DATE OF PROCEDURE:  07/04/2006  DATE OF DISCHARGE:  07/04/2006                                   PROGRESS NOTE   Arlesia rested quite well after her morphine injection and quit having  contractions.  Her cervix is unchanged.  She has been observed for about six  hours now and is being discharged home.      Jacklyn Shell, C.N.M.      Tilda Burrow, M.D.  Electronically Signed    FC/MEDQ  D:  07/04/2006  T:  07/04/2006  Job:  [id number]   cc:   [facility]

## 2011-04-22 NOTE — Op Note (Signed)
Julie [name], Julie [name]               ACCOUNT NO.:  [id number]   MEDICAL RECORD NO.:  [id number]          PATIENT TYPE:  INP   LOCATION:  A410                          FACILITY:  [facility]   PHYSICIAN:  Tilda Burrow, M.D. DATE OF BIRTH:  1971-08-28   DATE OF PROCEDURE:  DATE OF DISCHARGE:                                 OPERATIVE REPORT   PREOPERATIVE DIAGNOSIS:  Pregnancy at 69 weeks' gestation, for repeat  cesarean section, not for trial of labor.   POSTOPERATIVE DIAGNOSIS:  Pregnancy at 58 weeks' gestation, for repeat  cesarean section, not for trial of labor.   PROCEDURE:  Repeat low transverse cervical cesarean section.   SURGEON:  Tilda Burrow, M.D.   ASSISTANT:  None.   ANESTHESIA:  Spinal.   COMPLICATIONS:  None.   FINDINGS:  Healthy female infant, Apgars 9/9.   ESTIMATED BLOOD LOSS:  750 mL.   DETAILS OF PROCEDURE:  The patient was taken to the operating room, prepped  and draped.  Lower abdominal incision was repeated with excision of old  cicatrix.  Bladder flap was developed on the lower uterine segment without  difficulty using sterile technique.  A transverse uterine incision was made  via knife opening of the lower uterine segment, index finger traction  transversely to extend the incision and then the fetal vertex rotated into  the incision and delivered with fundal pressure.  Cord bloods samples were  obtained after the cord was clamped and the infant placed in the care of Dr.  Ara Kussmaul; see his notes for further details on the infant.  Cord blood  gases are documented elsewhere.   The placenta delivered intact, Schultze presentation, and then the patient  had the uterine irrigation with saline solution, closure of the uterine  cavity with running-locking 0 chromic in a single layer, 2-0 chromic closure  of the bladder flap.  The abdomen was irrigated with saline solution.  Anterior peritoneum closed with 2-0 chromic, fascia  closed with 0 Vicryl,  subcutaneous tissue were reapproximated with  interrupted 2-0 plain and staple closure of the skin completed the  procedure.   ESTIMATED BLOOD LOSS:  750      Tilda Burrow, M.D.  Electronically Signed     JVF/MEDQ  D:  07/28/2006  T:  07/28/2006  Job:  [id number]   cc:   [facility]   Francoise Schaumann. Milford Cage DO, FAAP  Fax: [phone]

## 2011-04-22 NOTE — H&P (Signed)
NAMEDIVINITY, KYLER NO.:  [id number]   MEDICAL RECORD NO.:  [id number]          PATIENT TYPE:  OIB   LOCATION:  A415                          FACILITY:  [facility]   PHYSICIAN:  Richardean Canal, M.D.  DATE OF BIRTH:  07/04/1971   DATE OF ADMISSION:  07/07/2006  DATE OF DISCHARGE:  08/03/2007LH                                HISTORY & PHYSICAL   This is a 40 year old white female gravida 7, para 3, C-section 1, with  consensus EDC August 11, 2006.  Admitted at [redacted] weeks gestation with  preterm contractions.   On admission, the patient was placed on the monitor and noted to be having  irregular uterine contractions.  The fetal heart showed good variability and  was a reactive NST.  There were no decelerations.   The patient was examined, and further history taken.  The patient stated  that terbutaline gave her an unpleasant reaction, and it was agreed that she  would not receive terbutaline.  The patient received Procardia 20 mg p.o.  and contractions subsided with one dose of Procardia.  The patient was  discharged home in satisfactory condition.   FINAL DIAGNOSIS:  Intrauterine gestation, 35 weeks, history of prior  cesarean section.  False labor.   DISPOSITION:  Discharge home.  Procardia 10 mg #20, to be taken one as  needed, not to exceed four per day.  Follow up with prenatal care with  [facility].                                            ______________________________  Richardean Canal, M.D.     RW/MEDQ  D:  07/09/2006  T:  07/09/2006  Job:  [id number]

## 2011-04-22 NOTE — Group Therapy Note (Signed)
[name], [name]               ACCOUNT NO.:  [id number]   MEDICAL RECORD NO.:  [id number]          PATIENT TYPE:  OIB   LOCATION:  A415                          FACILITY:  [facility]   PHYSICIAN:  Lazaro Arms, M.D.   DATE OF BIRTH:  02/09/1971   DATE OF PROCEDURE:  07/04/2006  DATE OF DISCHARGE:                                   PROGRESS NOTE   Julie [name] is 34 weeks 4 days gestation who came to my office today complaining  of increased contractions.  She was seen this weekend for contractions and  received Brethine to which she responded to; however, she does have a  history of anxiety and some heart issues where her ejection fraction is 45-  50% so the side effect of increased heart rate and anxiety was just too much  for her to handle.  Her cervix is essentially unchanged, 1 cm, 50% effaced,  -1 station.  She is having contractions that are palpable about every two to  four minutes or so.  Fetal heart rate is reactive without decelerations.  At  this point we will try conservative measures to quiet her uterus.  She just  received an IM injection of 14 mg of morphine sulfate.  If this does not  quiet her down will try some IV hydration.      Julie [name], C.N.M.      Lazaro Arms, M.D.  Electronically Signed    FC/MEDQ  D:  07/04/2006  T:  07/04/2006  Job:  [id number]   cc:   [facility]

## 2011-04-22 NOTE — Consult Note (Signed)
[name], [name]               ACCOUNT NO.:  [id number]   MEDICAL RECORD NO.:  [id number]          PATIENT TYPE:  OIB   LOCATION:  LDR3                          FACILITY:  [facility]   PHYSICIAN:  Tilda Burrow, M.D. DATE OF BIRTH:  04/28/1971   DATE OF CONSULTATION:  02/26/2005  DATE OF DISCHARGE:  02/26/2006                                   CONSULTATION   CHIEF COMPLAINT:  Lower abdominal pain at [redacted] weeks gestation.   HISTORY OF PRESENT ILLNESS:  This 40 year old female G4 P3 at [redacted] weeks  gestation presents with lower abdominal discomfort without bleeding or  gastric fluid.  The patient is concerned with the suprapubic discomfort with  no regular contractions noted.  She has had no recent trauma or injury.  The  pregnancy course has been notable for slightly increased Down's syndrome  risk with scheduled referral this week to [facility] for a targeted  ultrasound for Down's Syndrome Risk Increase.  The patient denies nausea or  vomiting, diarrhea.   PHYSICAL EXAM:  Abdominal exam shows vague discomfort over the uterine area  with no guarding noted, upper abdomen is completely nontender.  Internal  exam shows a firm cervix as consistent with a prior conization of the  cervix.  There is no cervical dilation.  The uterus is mildly sensitive to  touch 1+ out 1/4, plus tender with bladder specifically less tender than the  uterus.   IMPRESSION:  Nonspecific discomforts of pregnancy.  No evidence of acute  problem.  Check CBC with white count 14,700, 69 neutrophils noted.  Urinalysis performed completely negative for any signs of infection.   PLAN:  Symptomatic analgesics with Vicodin 5/500 x20 tablets, refill x1  written.  Follow up p.r.n. our office.      Tilda Burrow, M.D.  Electronically Signed     JVF/MEDQ  D:  02/26/2006  T:  03/01/2006  Job:  [id number]

## 2011-04-22 NOTE — H&P (Signed)
Julie [name], Julie [name]               ACCOUNT NO.:  [id number]   MEDICAL RECORD NO.:  [id number]          PATIENT TYPE:  OIB   LOCATION:  A418                          FACILITY:  [facility]   PHYSICIAN:  Tilda Burrow, M.D. DATE OF BIRTH:  January 20, 1971   DATE OF ADMISSION:  01/11/2006  DATE OF DISCHARGE:  LH                                HISTORY & PHYSICAL   HISTORY OF PRESENT ILLNESS:  Julie [name] is a 40 year old, gravida 6, para 3, who  is 9 weeks and 5 days gestation who presents to the office with lower right  quadrant pain yesterday.  She states that she was nauseated all day, could  not eat anything and had loose stools but no diarrhea stools.  On exam, she  does have some right lower quadrant tenderness which is too high for ovarian  pain.   PAST MEDICAL HISTORY:  Positive for occasional arrhythmia.   PAST SURGICAL HISTORY:  1.  Breast implants.  2.  Dilatation and curettage.  3.  Cesarean section.   ALLERGIES:  PENICILLIN.   PRENATAL COURSE:  Uneventful up to this point.   PHYSICAL EXAMINATION:  VITAL SIGNS: Stable.  ABDOMEN:  She does have some rebound tenderness and with standing on her  tiptoes and relaxing back down, she does have pain in the right lower  quadrant.   LABORATORY DATA:  A CBC with differential was obtained stat.  Will get an  ultrasound to rule out appendicitis and Hep-Lock her to manage. Give her IV  narcotics to manage her pain and discomfort and consult Dr. Michaelle Copas office  in regards to evaluation of this patient.      Zerita Boers, Lanier Clam      Tilda Burrow, M.D.  Electronically Signed    DL/MEDQ  D:  98/10/9146  T:  01/11/2006  Job:  [id number]   cc:   Dirk Dress. Katrinka Blazing, M.D.  Fax: [phone]

## 2011-04-22 NOTE — Discharge Summary (Signed)
NAMECRYSTEN, KAMAN               ACCOUNT NO.:  [id number]   MEDICAL RECORD NO.:  [id number]          PATIENT TYPE:  INP   LOCATION:  A410                          FACILITY:  [facility]   PHYSICIAN:  Lazaro Arms, M.D.   DATE OF BIRTH:  05/12/1971   DATE OF ADMISSION:  07/27/2006  DATE OF DISCHARGE:  08/25/2007LH                                 DISCHARGE SUMMARY   DISCHARGE DIAGNOSES:  1. Status post repeat cesarean section.  2. Unremarkable postoperative course.   PROCEDURE:  Repeat cesarean section.   Please refer to the history and physical on the antepartum chart for details  of admission to the hospital.   HOSPITAL COURSE:  The patient is admitted postoperatively.  She tolerated  clear liquids and a regular diet.  She voided without symptoms, was  extensively ambulatory going in to smoke several times a day.  Her  postoperative hemoglobin and hematocrit was 9.2 and 27.0 which were stable.  She remained afebrile throughout the postoperative course.   The incision was clean, dry and intact.  She was discharged to home on the  morning of postoperative day two in good stable condition to follow up in  the office on the 29th to have her staples removed.  She was discharged to  home on Tylox #40 and Motrin #20.  We will give her instruction precautions  for contacting the office prior to her scheduled visit.      Lazaro Arms, M.D.  Electronically Signed     LHE/MEDQ  D:  07/29/2006  T:  07/29/2006  Job:  [id number]

## 2011-04-22 NOTE — Group Therapy Note (Signed)
NAMETERESHA, HANKS NO.:  [id number]   MEDICAL RECORD NO.:  [id number]          PATIENT TYPE:  OIB   LOCATION:  LDR2                          FACILITY:  [facility]   PHYSICIAN:  Tilda Burrow, M.D. DATE OF BIRTH:  [birth year]   DATE OF PROCEDURE:  DATE OF DISCHARGE:  07/01/2006                                   PROGRESS NOTE   Julie [name] called the birthing center at approximately 1500.  She had been  observed overnight for preterm contractions without cervical change.  She  was 34 weeks 1 day gestation.  She was given a prescription for Brethine 2.5  mg to take p.o. for contractions.  She states that she simply cannot take  the medicine as the side-effect of the jitteriness and heart rate increase  is just too much for her as she already suffers from anxiety.  I advised her  to try other measures for which to keep her uterus quiet such as hydration,  warm bath and rest, however, to follow up with labor and delivery if she  starts having contractions that are more frequent or stronger in character.      Julie [name], C.N.M.      Tilda Burrow, M.D.  Electronically Signed    FC/MEDQ  D:  07/01/2006  T:  07/01/2006  Job:  [id number]   cc:   [facility]

## 2011-05-10 ENCOUNTER — Ambulatory Visit (INDEPENDENT_AMBULATORY_CARE_PROVIDER_SITE_OTHER): Payer: Medicaid Other | Admitting: Internal Medicine

## 2011-05-10 DIAGNOSIS — R197 Diarrhea, unspecified: Secondary | ICD-10-CM

## 2011-05-10 DIAGNOSIS — K625 Hemorrhage of anus and rectum: Secondary | ICD-10-CM

## 2011-05-20 ENCOUNTER — Encounter (INDEPENDENT_AMBULATORY_CARE_PROVIDER_SITE_OTHER): Payer: Medicaid Other | Admitting: Internal Medicine

## 2011-05-27 ENCOUNTER — Encounter (HOSPITAL_BASED_OUTPATIENT_CLINIC_OR_DEPARTMENT_OTHER): Payer: Medicaid Other | Admitting: Internal Medicine

## 2011-05-27 ENCOUNTER — Ambulatory Visit (HOSPITAL_COMMUNITY)
Admission: RE | Admit: 2011-05-27 | Discharge: 2011-05-27 | Disposition: A | Discharge status: Home / Self Care | Payer: Medicaid Other | Source: Ambulatory Visit | Attending: Internal Medicine | Admitting: Internal Medicine

## 2011-05-27 ENCOUNTER — Other Ambulatory Visit (INDEPENDENT_AMBULATORY_CARE_PROVIDER_SITE_OTHER): Payer: Self-pay | Admitting: Internal Medicine

## 2011-05-27 DIAGNOSIS — R197 Diarrhea, unspecified: Secondary | ICD-10-CM

## 2011-05-27 DIAGNOSIS — K644 Residual hemorrhoidal skin tags: Secondary | ICD-10-CM

## 2011-05-27 DIAGNOSIS — K625 Hemorrhage of anus and rectum: Secondary | ICD-10-CM

## 2011-05-27 DIAGNOSIS — D126 Benign neoplasm of colon, unspecified: Secondary | ICD-10-CM

## 2011-05-27 DIAGNOSIS — R109 Unspecified abdominal pain: Secondary | ICD-10-CM | POA: Insufficient documentation

## 2011-05-27 HISTORY — PX: COLONOSCOPY: SHX174

## 2011-06-05 NOTE — Op Note (Signed)
  NAMEJEANANN, BALINSKI               ACCOUNT NO.:  [id number]  MEDICAL RECORD NO.:  [id number]  LOCATION:  DAYP                          FACILITY:  [facility]  PHYSICIAN:  Lionel December, M.D.    DATE OF BIRTH:  June 22, 1971  DATE OF PROCEDURE:  05/27/2011 DATE OF DISCHARGE:                              OPERATIVE REPORT   PROCEDURE:  Colonoscopy with snare polypectomy.  INDICATION:  Julie [name] is a 40 year old Caucasian female who presents with diarrhea, abdominal cramps, and rectal bleeding for over 2 months.  She has been seen in emergency room on 2 occasions and her last hemoglobin in April 2012, was 11.8.  when I saw patient in our office on May 10, 2011, I gave her prescription for dicyclomine and she did not try this medicine.  She states she just forgot about it.  Procedures were reviewed with the patient.  Informed consent was obtained.  MEDS FOR CONSCIOUS SEDATION:  Demerol 25 mg IV, Versed 8 mg IV in divided dose, Benadryl 25 mg IV for itching at the site of IV.  FINDINGS:  Procedure performed in endoscopy suite.  The patient's vital signs and O2 sat were monitored during the procedure and remained stable.  The patient was placed in left lateral recumbent position and rectal examination performed.  No abnormality noted on external or digital exam.  Pentax videoscope was placed through rectum and advanced under vision into sigmoid colon and beyond.  Preparation was excellent. Scope was passed into cecum which was identified by appendiceal orifice, ileocecal valve.  GI was also examined for 10 cm, it was normal.  As the scope was withdrawn, colonic mucosa was carefully examined.  There was 6- 7 mm polyp at transverse colon which was snared and retrieved for sludge examination.  Rest of the mucosa was normal.  Random biopsies were taken from mucosa of sigmoid colon looking for microscopic colitis.  Rectal mucosa similarly was normal.  Scope was retroflexed to examine  anorectal junction and small hemorrhoids noted below the dentate line.  Endoscope was then withdrawn.  Withdrawal time was 11 minutes.  The patient tolerated the procedure well.  FINAL DIAGNOSES: 1. Normal terminal ileum.  No evidence of endoscopic colitis. 2. A 6-7 mm polyp snared from transverse colon. 3. External hemorrhoids. 4. Suspect she has irritable bowel syndrome and rectal bleeding     secondary to hemorrhoids.  RECOMMENDATIONS: 1. The patient advised to start dicyclomine at 10 mg for each meal. 2. High-fiber diet. 3. No aspirin for 1 week. 4. I will be contacting patient with results of biopsy and further     recommendations.          ______________________________ Lionel December, M.D.     NR/MEDQ  D:  05/27/2011  T:  05/27/2011  Job:  [id number]  cc:   [place], [place]  Electronically Signed by Lionel December M.D. on 06/05/2011 12:45:11 PM
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
## 2011-07-04 ENCOUNTER — Encounter (INDEPENDENT_AMBULATORY_CARE_PROVIDER_SITE_OTHER): Payer: Self-pay

## 2011-07-26 ENCOUNTER — Ambulatory Visit (INDEPENDENT_AMBULATORY_CARE_PROVIDER_SITE_OTHER): Payer: Medicaid Other | Admitting: Internal Medicine

## 2011-09-13 LAB — URINALYSIS, ROUTINE W REFLEX MICROSCOPIC
Bilirubin Urine: NEGATIVE
Ketones, ur: NEGATIVE
Protein, ur: NEGATIVE
Urobilinogen, UA: 0.2

## 2011-09-13 LAB — CBC
Hemoglobin: 13.6
MCV: 91.7
RBC: 4.42
WBC: 12.9 — ABNORMAL HIGH

## 2011-09-13 LAB — COMPREHENSIVE METABOLIC PANEL
ALT: 14
AST: 18
CO2: 25
Chloride: 102
Creatinine, Ser: 0.69
GFR calc Af Amer: >60
GFR calc non Af Amer: >60
Glucose, Bld: 89
Sodium: 134 — ABNORMAL LOW
Total Bilirubin: 0.9

## 2011-09-13 LAB — DIFFERENTIAL
Basophils Absolute: 0
Eosinophils Absolute: 0.3
Eosinophils Relative: 2
Neutrophils Relative %: 60

## 2011-09-13 LAB — LIPASE, BLOOD: Lipase: 19

## 2011-09-13 LAB — PREGNANCY, URINE: Preg Test, Ur: NEGATIVE

## 2011-09-15 LAB — CBC
HCT: 36.3
Hemoglobin: 12.4
MCHC: 34.3
MCV: 92.3
Platelets: 373
RBC: 3.93
RDW: 13.2
WBC: 11.1 — ABNORMAL HIGH

## 2011-09-15 LAB — HCG, QUANTITATIVE, PREGNANCY: hCG, Beta Chain, Quant, S: <2

## 2011-09-16 LAB — CBC
HCT: 36.7
Hemoglobin: 12.5
MCV: 93.4
RDW: 12.9

## 2011-09-16 LAB — HCG, QUANTITATIVE, PREGNANCY: hCG, Beta Chain, Quant, S: <2

## 2011-09-22 ENCOUNTER — Ambulatory Visit (INDEPENDENT_AMBULATORY_CARE_PROVIDER_SITE_OTHER): Payer: Medicaid Other | Admitting: Internal Medicine

## 2011-12-08 ENCOUNTER — Encounter (HOSPITAL_COMMUNITY): Payer: Self-pay

## 2011-12-08 ENCOUNTER — Emergency Department (HOSPITAL_COMMUNITY)
Admission: EM | Admit: 2011-12-08 | Discharge: 2011-12-08 | Disposition: A | Discharge status: Home / Self Care | Payer: Medicaid Other | Attending: Emergency Medicine | Admitting: Emergency Medicine

## 2011-12-08 DIAGNOSIS — Z79899 Other long term (current) drug therapy: Secondary | ICD-10-CM | POA: Insufficient documentation

## 2011-12-08 DIAGNOSIS — M545 Low back pain, unspecified: Secondary | ICD-10-CM | POA: Insufficient documentation

## 2011-12-08 DIAGNOSIS — M549 Dorsalgia, unspecified: Secondary | ICD-10-CM

## 2011-12-08 DIAGNOSIS — K219 Gastro-esophageal reflux disease without esophagitis: Secondary | ICD-10-CM | POA: Insufficient documentation

## 2011-12-08 DIAGNOSIS — R197 Diarrhea, unspecified: Secondary | ICD-10-CM | POA: Insufficient documentation

## 2011-12-08 DIAGNOSIS — F411 Generalized anxiety disorder: Secondary | ICD-10-CM | POA: Insufficient documentation

## 2011-12-08 DIAGNOSIS — M129 Arthropathy, unspecified: Secondary | ICD-10-CM | POA: Insufficient documentation

## 2011-12-08 MED ORDER — ONDANSETRON HCL 4 MG PO TABS
4.0000 mg | ORAL_TABLET | Freq: Once | ORAL | Status: AC
  Administered 2011-12-08: 4 mg via ORAL
  Filled 2011-12-08: qty 1

## 2011-12-08 MED ORDER — DEXAMETHASONE SODIUM PHOSPHATE 4 MG/ML IJ SOLN
8.0000 mg | Freq: Once | INTRAMUSCULAR | Status: AC
  Administered 2011-12-08: 8 mg via INTRAMUSCULAR
  Filled 2011-12-08: qty 2

## 2011-12-08 MED ORDER — METHOCARBAMOL 500 MG PO TABS: ORAL_TABLET | ORAL | Status: DC

## 2011-12-08 MED ORDER — METHOCARBAMOL 500 MG PO TABS
1000.0000 mg | ORAL_TABLET | Freq: Once | ORAL | Status: AC
  Administered 2011-12-08: 1000 mg via ORAL
  Filled 2011-12-08: qty 2

## 2011-12-08 MED ORDER — MORPHINE SULFATE 10 MG/ML IJ SOLN
8.0000 mg | Freq: Once | INTRAMUSCULAR | Status: AC
  Administered 2011-12-08: 8 mg via INTRAMUSCULAR
  Filled 2011-12-08: qty 1

## 2011-12-08 MED ORDER — DEXAMETHASONE 6 MG PO TABS: ORAL_TABLET | ORAL | Status: AC

## 2011-12-08 NOTE — ED Provider Notes (Signed)
History     CSN: [id number]  Arrival date & time 12/08/11  0959   None     Chief Complaint  Patient presents with  . Back Pain    (Consider location/radiation/quality/duration/timing/severity/associated sxs/prior treatment) Patient is a 41 y.o. female presenting with back pain. The history is provided by the patient.  Back Pain  This is a new problem. The current episode started 2 days ago. The problem has not changed since onset.Associated with: degenerative disc disease. The pain is present in the lumbar spine. The quality of the pain is described as shooting and aching. The pain is at a severity of 10/10. The pain is severe. The symptoms are aggravated by twisting and certain positions. The pain is the same all the time. Pertinent negatives include no chest pain, no abdominal pain, no bowel incontinence, no perianal numbness, no bladder incontinence, no dysuria and no weakness. She has tried NSAIDs and analgesics for the symptoms.    Past Medical History  Diagnosis Date  . GERD (gastroesophageal reflux disease)   . Anxiety   . Back pain   . Arthritis   . Chronic diarrhea   . Irritable bowel syndrome     Past Surgical History  Procedure Date  . Cesarean section   . Breast enhancement surgery   . Colonoscopy 05/27/2011    WITH SNARE    No family history on file.  History  Substance Use Topics  . Smoking status: Not on file  . Smokeless tobacco: Not on file  . Alcohol Use:     OB History    Grav Para Term Preterm Abortions TAB SAB Ect Mult Living                  Review of Systems  Constitutional: Negative for activity change.       All ROS Neg except as noted in HPI  HENT: Negative for nosebleeds and neck pain.   Eyes: Negative for photophobia and discharge.  Respiratory: Negative for cough, shortness of breath and wheezing.   Cardiovascular: Negative for chest pain and palpitations.  Gastrointestinal: Negative for abdominal pain, blood in stool and bowel  incontinence.  Genitourinary: Negative for bladder incontinence, dysuria, frequency and hematuria.  Musculoskeletal: Positive for back pain. Negative for arthralgias.  Skin: Negative.   Neurological: Negative for dizziness, seizures, speech difficulty and weakness.  Psychiatric/Behavioral: Negative for hallucinations and confusion.    Allergies  Flagyl and Penicillins  Home Medications   Current Outpatient Rx  Name Route Sig Dispense Refill  . ALPRAZOLAM 1 MG PO TABS Oral Take 1 mg by mouth 2 (two) times daily.      . ASPIRIN 81 MG PO TABS Oral Take 81 mg by mouth daily.      Marland Kitchen HYDROCODONE-ACETAMINOPHEN 5-500 MG PO CAPS Oral Take 1 capsule by mouth every 6 (six) hours as needed. For pain    . OMEPRAZOLE 40 MG PO CPDR Oral Take 40 mg by mouth daily.      Marland Kitchen SIMETHICONE 125 MG PO CHEW Oral Chew 125 mg by mouth every 6 (six) hours as needed.        BP 98/76  Pulse 107  Temp(Src) 98.2 F (36.8 C) (Oral)  Resp 16  Ht 5\' 1"  (1.549 m)  Wt 127 lb (57.607 kg)  BMI 24.00 kg/m2  SpO2 100%  LMP 11/28/2011  Physical Exam  Nursing note and vitals reviewed. Constitutional: She is oriented to person, place, and time. She appears well-developed and well-nourished.  Non-toxic appearance.  HENT:  Head: Normocephalic.  Right Ear: Tympanic membrane and external ear normal.  Left Ear: Tympanic membrane and external ear normal.  Eyes: EOM and lids are normal. Pupils are equal, round, and reactive to light.  Neck: Normal range of motion. Neck supple. Carotid bruit is not present.  Cardiovascular: Normal rate, regular rhythm, normal heart sounds, intact distal pulses and normal pulses.   Pulmonary/Chest: Breath sounds normal. No respiratory distress.  Abdominal: Soft. Bowel sounds are normal. There is no tenderness. There is no guarding.  Musculoskeletal: Normal range of motion.       Pain to the lower lumbar area to palpation an to attempted ROM  Lymphadenopathy:       Head (right side): No  submandibular adenopathy present.       Head (left side): No submandibular adenopathy present.    She has no cervical adenopathy.  Neurological: She is alert and oriented to person, place, and time. She has normal strength. No cranial nerve deficit or sensory deficit.  Skin: Skin is warm and dry.  Psychiatric: She has a normal mood and affect. Her speech is normal.    ED Course  Procedures (including critical care time)  Labs Reviewed - No data to display No results found.   Dx: Low back pain   MDM  I have reviewed nursing notes, vital signs, and all appropriate lab and imaging results for this patient.  Patient has history of chronic back pain. Patient has history of degenerative disc disease and arthritis related to the spine area. Her examination is negative for acute neurological deficit. The plan at this time is to add Decadron and Robaxin to the Lortab that the patient is currently taking. Patient is to see her primary physician and or specialist for continued management of his ongoing problem.     Kathie Dike, [place] 12/10/11 [phone]

## 2011-12-08 NOTE — ED Notes (Signed)
C/o lower back pain for 2 days. Denies injury. Ambulated to triage without difficulty.

## 2011-12-11 NOTE — ED Provider Notes (Signed)
Medical screening examination/treatment/procedure(s) were performed by non-physician practitioner and as supervising physician I was immediately available for consultation/collaboration.   Jasminemarie Sherrard L Kaori Jumper, MD 12/11/11 0741 

## 2012-07-24 ENCOUNTER — Encounter (HOSPITAL_COMMUNITY): Payer: Self-pay | Admitting: Emergency Medicine

## 2012-07-24 ENCOUNTER — Emergency Department (HOSPITAL_COMMUNITY)
Admission: EM | Admit: 2012-07-24 | Discharge: 2012-07-24 | Disposition: A | Discharge status: Home / Self Care | Payer: Medicaid Other | Attending: Emergency Medicine | Admitting: Emergency Medicine

## 2012-07-24 ENCOUNTER — Emergency Department (HOSPITAL_COMMUNITY): Payer: Medicaid Other

## 2012-07-24 DIAGNOSIS — R079 Chest pain, unspecified: Secondary | ICD-10-CM | POA: Insufficient documentation

## 2012-07-24 DIAGNOSIS — R51 Headache: Secondary | ICD-10-CM | POA: Insufficient documentation

## 2012-07-24 DIAGNOSIS — R5383 Other fatigue: Secondary | ICD-10-CM | POA: Insufficient documentation

## 2012-07-24 DIAGNOSIS — F411 Generalized anxiety disorder: Secondary | ICD-10-CM | POA: Insufficient documentation

## 2012-07-24 DIAGNOSIS — R5381 Other malaise: Secondary | ICD-10-CM | POA: Insufficient documentation

## 2012-07-24 DIAGNOSIS — R0602 Shortness of breath: Secondary | ICD-10-CM

## 2012-07-24 LAB — CBC
HCT: 37 % (ref 36.0–46.0)
Hemoglobin: 12.4 g/dL (ref 12.0–15.0)
MCH: 33.8 pg (ref 26.0–34.0)
MCHC: 33.5 g/dL (ref 30.0–36.0)
MCV: 100.8 fL — ABNORMAL HIGH (ref 78.0–100.0)
Platelets: 276 10*3/uL (ref 150–400)
RBC: 3.67 MIL/uL — ABNORMAL LOW (ref 3.87–5.11)
RDW: 12.9 % (ref 11.5–15.5)
WBC: 12.5 10*3/uL — ABNORMAL HIGH (ref 4.0–10.5)

## 2012-07-24 LAB — URINALYSIS, ROUTINE W REFLEX MICROSCOPIC
Leukocytes, UA: NEGATIVE
Nitrite: NEGATIVE
Specific Gravity, Urine: 1.006 (ref 1.005–1.030)
Urobilinogen, UA: 0.2 mg/dL (ref 0.0–1.0)
pH: 6.5 (ref 5.0–8.0)

## 2012-07-24 LAB — BASIC METABOLIC PANEL
BUN: 6 mg/dL (ref 6–23)
CO2: 21 mEq/L (ref 19–32)
Calcium: 9.2 mg/dL (ref 8.4–10.5)
Chloride: 105 mEq/L (ref 96–112)
Creatinine, Ser: 0.55 mg/dL (ref 0.50–1.10)
GFR calc Af Amer: >90 mL/min (ref >90)
GFR calc non Af Amer: >90 mL/min (ref >90)
Glucose, Bld: 98 mg/dL (ref 70–99)
Potassium: 3.3 mEq/L — ABNORMAL LOW (ref 3.5–5.1)
Sodium: 137 mEq/L (ref 135–145)

## 2012-07-24 LAB — POCT I-STAT TROPONIN I
Troponin i, poc: 0 ng/mL (ref 0.00–0.08)
Troponin i, poc: 0 ng/mL (ref 0.00–0.08)

## 2012-07-24 LAB — PRO B NATRIURETIC PEPTIDE: Pro B Natriuretic peptide (BNP): 137.8 pg/mL — ABNORMAL HIGH (ref 0–125)

## 2012-07-24 LAB — URINE MICROSCOPIC-ADD ON

## 2012-07-24 MED ORDER — LORAZEPAM 2 MG/ML IJ SOLN
1.0000 mg | Freq: Once | INTRAMUSCULAR | Status: AC
  Administered 2012-07-24: 1 mg via INTRAVENOUS
  Filled 2012-07-24: qty 1

## 2012-07-24 MED ORDER — SODIUM CHLORIDE 0.9 % IV SOLN
Freq: Once | INTRAVENOUS | Status: AC
  Administered 2012-07-24: 1000 mL via INTRAVENOUS

## 2012-07-24 MED ORDER — ALBUTEROL SULFATE (5 MG/ML) 0.5% IN NEBU
5.0000 mg | INHALATION_SOLUTION | Freq: Once | RESPIRATORY_TRACT | Status: AC
  Administered 2012-07-24: 5 mg via RESPIRATORY_TRACT
  Filled 2012-07-24: qty 1

## 2012-07-24 MED ORDER — MORPHINE SULFATE 4 MG/ML IJ SOLN
4.0000 mg | Freq: Once | INTRAMUSCULAR | Status: AC
  Administered 2012-07-24: 4 mg via INTRAVENOUS
  Filled 2012-07-24: qty 1

## 2012-07-24 MED ORDER — ONDANSETRON HCL 4 MG/2ML IJ SOLN
4.0000 mg | INTRAMUSCULAR | Status: AC
  Administered 2012-07-24: 4 mg via INTRAVENOUS
  Filled 2012-07-24: qty 2

## 2012-07-24 MED ORDER — POTASSIUM CHLORIDE CRYS ER 20 MEQ PO TBCR
40.0000 meq | EXTENDED_RELEASE_TABLET | Freq: Once | ORAL | Status: AC
  Administered 2012-07-24: 40 meq via ORAL
  Filled 2012-07-24: qty 2

## 2012-07-24 NOTE — ED Notes (Signed)
Pt c/o generalized CP and SOB starting this am; pt sts some pain in left arm

## 2012-07-24 NOTE — ED Provider Notes (Signed)
  EKG personally reviewed by myself.   EKG:  Rhythm: normal sinus Vent. rate 95 BPM PR interval 150 ms QRS duration 66 ms QT/QTc 356/447 ms Axis: normal ST segments: nonspecific ST changes anteriorly Comparison: None  Female with chest pain and shortness of breath. On exam mildy anxious. CP not reproducible. RRR w/o murmur. Lungs clear. Abd soft and NT. Lower extremities symmetric as compared to each other. No calf tenderness. Negative Homan's. No palpable cords.Atypical for for ACS given its constant duration for several hours. Normal troponin x2 with second more than 6 hours after the onset of symptoms. Doubt PE. CXR clear. Feel safe for Dc with outpt fu.  Raeford Razor, MD 07/29/12 1705

## 2012-07-24 NOTE — ED Provider Notes (Signed)
History     CSN: [id number]  Arrival date & time 07/24/12  1133   First MD Initiated Contact with Patient 07/24/12 1536      Chief Complaint  Patient presents with  . Chest Pain  . Shortness of Breath    (Consider location/radiation/quality/duration/timing/severity/associated sxs/prior treatment) The history is provided by the patient, a relative and medical records.   Julie [name] is a 41 y.o. female presents to the emergency department complaining of chest pain and shortness of breath.  The onset of the symptoms was  abrupt starting 8 hours ago.  The patient has associated swelling of the hands, feet, face and abd that resolved prior to hospital arrived;Marland Kitchen  The symptoms have been  persistent, gradually worsened.  nothing makes the symptoms worse and nothing makes symptoms better.  The patient denies nausea, vomiting, neck pain.  Patient states she awoke this morning with her feet, hands, face and abdomen very swollen along with chest pain or shortness of breath. Patient has a history of leaky valves and arrhythmia for which she has seen a cardiologist in the past. She called her cardiologist this morning the cardiologist recommended that she be seen here in the emergency room. Symptoms began around 6:15 this morning she presented to the emergency room around noon.  She states all of the swelling have resolved by that time but she continued to have chest pain shortness of breath. Chest states increasing fatigue for the last 3 weeks with multiple naps during the day which is unusual for her.  She has a generalized, severe headache at this time.  Her chest pain is rated at a 5/10, it is described as sharp, she states that it comes in waves with the shortness of breath, and it does not radiate.  The patient has medical history significant for:   Past Medical History  Diagnosis Date  . GERD (gastroesophageal reflux disease)   . Anxiety   . Back pain   . Arthritis   . Chronic diarrhea   .  Irritable bowel syndrome     Past Surgical History  Procedure Date  . Cesarean section   . Breast enhancement surgery   . Colonoscopy 05/27/2011    WITH SNARE    History reviewed. No pertinent family history.  History  Substance Use Topics  . Smoking status: Not on file  . Smokeless tobacco: Not on file  . Alcohol Use:     OB History    Grav Para Term Preterm Abortions TAB SAB Ect Mult Living                  Review of Systems  Constitutional: Positive for fatigue (x3 weeks). Negative for fever, diaphoresis, appetite change and unexpected weight change.  HENT: Negative for mouth sores.   Eyes: Negative for visual disturbance.  Respiratory: Positive for shortness of breath. Negative for cough, chest tightness and wheezing.   Cardiovascular: Positive for chest pain and leg swelling (resolved).  Gastrointestinal: Negative for nausea, vomiting, abdominal pain, diarrhea and constipation.  Genitourinary: Negative for dysuria, urgency, frequency and hematuria.  Musculoskeletal: Negative for back pain.  Skin: Negative for rash.  Neurological: Positive for headaches. Negative for syncope, speech difficulty, weakness and light-headedness.  Hematological: Does not bruise/bleed easily.  Psychiatric/Behavioral: Negative for disturbed wake/sleep cycle. The patient is nervous/anxious.     Allergies  Flagyl and Penicillins  Home Medications   Current Outpatient Rx  Name Route Sig Dispense Refill  . ALPRAZOLAM 1 MG PO TABS  Oral Take 1 mg by mouth 2 (two) times daily.      . ASPIRIN 81 MG PO TABS Oral Take 81 mg by mouth daily.      Marland Kitchen CETIRIZINE HCL 10 MG PO TABS Oral Take 10 mg by mouth daily.    Marland Kitchen HYDROCODONE-ACETAMINOPHEN 5-500 MG PO CAPS Oral Take 1 capsule by mouth every 6 (six) hours as needed. For pain    . OMEPRAZOLE 40 MG PO CPDR Oral Take 40 mg by mouth daily.      Marland Kitchen ALIGN 4 MG PO CAPS Oral Take 1 capsule by mouth daily.      BP 109/71  Pulse 69  Temp 98.5 F (36.9  C) (Oral)  Resp 16  SpO2 100%  LMP 06/18/2012  Physical Exam  Nursing note and vitals reviewed. Constitutional: She appears well-developed and well-nourished. No distress.  HENT:  Head: Normocephalic and atraumatic.  Mouth/Throat: Oropharynx is clear and moist. No oropharyngeal exudate.  Eyes: Conjunctivae are normal. Pupils are equal, round, and reactive to light. No scleral icterus.  Neck: Normal range of motion. Neck supple.  Cardiovascular: Normal rate, regular rhythm, normal heart sounds and intact distal pulses.  Exam reveals no gallop and no friction rub.   No murmur heard. Pulmonary/Chest: Effort normal and breath sounds normal. No respiratory distress. She has no wheezes.       Decreased air movement, inspiratory volume.  Abdominal: Soft. Bowel sounds are normal. She exhibits no mass. There is no tenderness. There is no rebound and no guarding.  Musculoskeletal: Normal range of motion. She exhibits no edema.       No edema noted in the hands feet abdomen or face at this time.  Lymphadenopathy:    She has no cervical adenopathy.  Neurological: She is alert.       Speech is clear and goal oriented Moves extremities without ataxia  Skin: Skin is warm and dry. No rash noted. She is not diaphoretic.  Psychiatric: She has a normal mood and affect.    ED Course  Procedures (including critical care time)  Labs Reviewed  CBC - Abnormal; Notable for the following:    WBC 12.5 (*)     RBC 3.67 (*)     MCV 100.8 (*)     All other components within normal limits  BASIC METABOLIC PANEL - Abnormal; Notable for the following:    Potassium 3.3 (*)     All other components within normal limits  PRO B NATRIURETIC PEPTIDE - Abnormal; Notable for the following:    Pro B Natriuretic peptide (BNP) 137.8 (*)     All other components within normal limits  URINALYSIS, ROUTINE W REFLEX MICROSCOPIC - Abnormal; Notable for the following:    Hgb urine dipstick TRACE (*)     All other  components within normal limits  URINE MICROSCOPIC-ADD ON - Abnormal; Notable for the following:    Squamous Epithelial / LPF FEW (*)     All other components within normal limits  POCT I-STAT TROPONIN I  POCT I-STAT TROPONIN I   Dg Chest 2 View  07/24/2012  *RADIOLOGY REPORT*  Clinical Data: Shortness of breath  CHEST - 2 VIEW  Comparison: August 29, 2005  Findings: The cardiac silhouette, mediastinum, pulmonary vasculature are within normal limits.  Both lungs are clear. There is no acute bony abnormality.  IMPRESSION: There is no evidence of acute cardiac or pulmonary process.   Original Report Authenticated By: Brandon Melnick, M.D.  RN states the patient is becoming agitated, demanding more medication stating she's not been treated well.  She is also demanding her night Xanax and food.  5:50 PM   Date: 07/24/2012  Rate: 95  Rhythm: normal sinus rhythm  QRS Axis: normal  Intervals: normal  ST/T Wave abnormalities: nonspecific ST changes  Conduction Disutrbances:none  Narrative Interpretation: Sinus rhythm, no old EKG to compare.  Old EKG Reviewed: none available    1. Chest pain   2. Shortness of breath       MDM  Arloa CELE MOTE because of chest pain and shortness of breath times almost 8 hours.  UA is unremarkable for urinary tract infection. BMP with hypokalemia at 3.3 I will replace orally. Troponin at 0 CBC with an elevated white count of 12.5 but this is baseline for the patient.  Chest x-ray without evidence of acute cardiac or pulmonary process.  I have ordered pain control for the patient as well as maintenance fluids.  I also reordered a troponin. Patient is a smoker and on exam is not wheezing but is not moving much air I will give albuterol treatment and reassess her breathing at that time.  She has seen [facility] in the past, but does not have a current cardiologist. She is breathing better after the breathing treatment.  No evidence of a DVT.  She is low  risk for PE on the Well's criteria.  She is PERC negative.  I think it is unlikely she has a PE.  Her pain is controlled after the morphine. I've given 1 mg of Ativan to help control her agitation. Repeat troponin is negative. Patient is stable, vitals are stable. There no concerning EKG changes in conjunction with a negative chest x-ray and atypical chest pain.  Currently the patient is safe to return home tonight and followup with her cardiologist tomorrow.  I have also discussed reasons to return immediately to the ER.  Patient states understanding.    1. Medications: Usual home medications 2. Treatment: Rest, hydration 3. Follow Up: With primary care physician and cardiologist as soon as possible.         Dahlia Client Meshell Abdulaziz, PA-C 07/24/12 1754

## 2012-07-29 NOTE — ED Provider Notes (Signed)
Medical screening examination/treatment/procedure(s) were conducted as a shared visit with non-physician practitioner(s) and myself.  I personally evaluated the patient during the encounter.  Please see completed note for this encounter.  Raeford Razor, MD 07/29/12 [phone]

## 2012-08-16 ENCOUNTER — Ambulatory Visit (INDEPENDENT_AMBULATORY_CARE_PROVIDER_SITE_OTHER): Payer: Medicaid Other | Admitting: Internal Medicine

## 2012-08-16 ENCOUNTER — Encounter (INDEPENDENT_AMBULATORY_CARE_PROVIDER_SITE_OTHER): Payer: Self-pay | Admitting: Internal Medicine

## 2012-08-16 VITALS — BP 90/58 | HR 76 | Temp 98.1°F | Ht 61.7 in | Wt 134.5 lb

## 2012-08-16 DIAGNOSIS — M1990 Unspecified osteoarthritis, unspecified site: Secondary | ICD-10-CM | POA: Insufficient documentation

## 2012-08-16 DIAGNOSIS — K219 Gastro-esophageal reflux disease without esophagitis: Secondary | ICD-10-CM | POA: Insufficient documentation

## 2012-08-16 DIAGNOSIS — K589 Irritable bowel syndrome without diarrhea: Secondary | ICD-10-CM | POA: Insufficient documentation

## 2012-08-16 MED ORDER — DICYCLOMINE HCL 10 MG PO CAPS: 10.0000 mg | ORAL_CAPSULE | Freq: Three times a day (TID) | ORAL | Status: DC

## 2012-08-16 NOTE — Progress Notes (Signed)
Subjective:     Patient ID: Julie [name], female   DOB: 05/05/1971, 41 y.o.   MRN: [id number]  HPIPresents today with c/o of lower abdominal pain. She says the abdominal pain has been occurring for years. Sometimes when she has a BM, she will break out in a sweat. She usually has a BM about three times in the am and 3 in the evening. Stool are usually usually loose. She feels bloated. Appetite is good. No weight loss.  Her last colonoscopy was in 2012: FINAL DIAGNOSES:  1. Normal terminal ileum. No evidence of endoscopic colitis.  2. A 6-7 mm polyp snared from transverse colon.  3. External hemorrhoids.  4. Suspect she has irritable bowel syndrome and rectal bleeding  secondary to hemorrhoids. Biopsy: Tubular adenoma. No high grade dysplasia.   Review of Systemssee hpi Current Outpatient Prescriptions  Medication Sig Dispense Refill  . ALPRAZolam (XANAX) 1 MG tablet Take 1 mg by mouth 2 (two) times daily.        Marland Kitchen aspirin 81 MG tablet Take 81 mg by mouth daily.        . cetirizine (ZYRTEC) 10 MG tablet Take 10 mg by mouth daily.      Marland Kitchen omeprazole (PRILOSEC) 40 MG capsule Take 40 mg by mouth daily.        . Probiotic Product (ALIGN) 4 MG CAPS Take 1 capsule by mouth daily.      Marland Kitchen dicyclomine (BENTYL) 10 MG capsule Take 1 capsule (10 mg total) by mouth 3 (three) times daily.  90 capsule  1  . hydrocodone-acetaminophen (LORCET-HD) 5-500 MG per capsule Take 1 capsule by mouth every 6 (six) hours as needed. For pain       Past Medical History  Diagnosis Date  . GERD (gastroesophageal reflux disease)   . Anxiety   . Back pain   . Arthritis   . Chronic diarrhea   . Irritable bowel syndrome    Past Surgical History  Procedure Date  . Cesarean section   . Breast enhancement surgery   . Colonoscopy 05/27/2011    WITH SNARE   History   Social History  . Marital Status: Single    Spouse Name: N/A    Number of Children: N/A  . Years of Education: N/A   Occupational History  .  Not on file.   Social History Main Topics  . Smoking status: Current Every Day Smoker  . Smokeless tobacco: Not on file  . Alcohol Use: Yes     occasionally  . Drug Use: No  . Sexually Active: Not on file   Other Topics Concern  . Not on file   Social History Narrative  . No narrative on file   Family Status  Relation Status Death Age  . Mother Alive     good health  . Father Alive     Diverticulitis  . Sister Alive     good health  . Brother Alive     good health   Allergies  Allergen Reactions  . Flagyl (Metronidazole Hcl)   . Penicillins         Objective:   Physical Exam Filed Vitals:   08/16/12 1044  Height: 5' 1.7" (1.567 m)  Weight: 134 lb 8 oz (61.009 kg)   Alert and oriented. Skin warm and dry. Oral mucosa is moist.   . Sclera anicteric, conjunctivae is pink. Thyroid not enlarged. No cervical lymphadenopathy. Lungs clear. Heart regular rate and rhythm.  Abdomen is  soft. Bowel sounds are positive. No hepatomegaly. No abdominal masses felt. No tenderness.  No edema to lower extremities.       Assessment:     Probably IBS given her symptoms. No new symptoms. Colonoscopy in 2012 revealed one polp (tubular adenoma.)   Plan:  Dicyclomine 10mg  TID # 90. 2 refills. OV in 2 months. PR in 2 weeks.

## 2012-08-16 NOTE — Patient Instructions (Addendum)
Dicyclomine 10mg  TID. #90.  OV 2 months. PR in 2 weeks.

## 2012-10-16 ENCOUNTER — Ambulatory Visit (INDEPENDENT_AMBULATORY_CARE_PROVIDER_SITE_OTHER): Payer: Medicaid Other | Admitting: Internal Medicine

## 2013-08-19 ENCOUNTER — Emergency Department (HOSPITAL_COMMUNITY): Payer: Medicaid Other

## 2013-08-19 ENCOUNTER — Emergency Department (HOSPITAL_COMMUNITY)
Admission: EM | Admit: 2013-08-19 | Discharge: 2013-08-19 | Disposition: A | Discharge status: Home / Self Care | Payer: Medicaid Other | Attending: Emergency Medicine | Admitting: Emergency Medicine

## 2013-08-19 ENCOUNTER — Encounter (HOSPITAL_COMMUNITY): Payer: Self-pay | Admitting: *Deleted

## 2013-08-19 DIAGNOSIS — F411 Generalized anxiety disorder: Secondary | ICD-10-CM | POA: Insufficient documentation

## 2013-08-19 DIAGNOSIS — Z880 Allergy status to penicillin: Secondary | ICD-10-CM | POA: Insufficient documentation

## 2013-08-19 DIAGNOSIS — F17200 Nicotine dependence, unspecified, uncomplicated: Secondary | ICD-10-CM | POA: Insufficient documentation

## 2013-08-19 DIAGNOSIS — Z792 Long term (current) use of antibiotics: Secondary | ICD-10-CM | POA: Insufficient documentation

## 2013-08-19 DIAGNOSIS — Z7982 Long term (current) use of aspirin: Secondary | ICD-10-CM | POA: Insufficient documentation

## 2013-08-19 DIAGNOSIS — J40 Bronchitis, not specified as acute or chronic: Secondary | ICD-10-CM

## 2013-08-19 DIAGNOSIS — Z79899 Other long term (current) drug therapy: Secondary | ICD-10-CM | POA: Insufficient documentation

## 2013-08-19 DIAGNOSIS — M129 Arthropathy, unspecified: Secondary | ICD-10-CM | POA: Insufficient documentation

## 2013-08-19 DIAGNOSIS — J9801 Acute bronchospasm: Secondary | ICD-10-CM | POA: Insufficient documentation

## 2013-08-19 DIAGNOSIS — K219 Gastro-esophageal reflux disease without esophagitis: Secondary | ICD-10-CM | POA: Insufficient documentation

## 2013-08-19 LAB — CBC WITH DIFFERENTIAL/PLATELET
Basophils Absolute: 0 10*3/uL (ref 0.0–0.1)
Eosinophils Absolute: 0.2 10*3/uL (ref 0.0–0.7)
Eosinophils Relative: 2 % (ref 0–5)
HCT: 40.3 % (ref 36.0–46.0)
Lymphocytes Relative: 35 % (ref 12–46)
MCH: 33.3 pg (ref 26.0–34.0)
MCHC: 33.7 g/dL (ref 30.0–36.0)
MCV: 98.8 fL (ref 78.0–100.0)
Monocytes Absolute: 0.8 10*3/uL (ref 0.1–1.0)
RDW: 12.6 % (ref 11.5–15.5)
WBC: 9.5 10*3/uL (ref 4.0–10.5)

## 2013-08-19 LAB — COMPREHENSIVE METABOLIC PANEL
ALT: 30 U/L (ref 0–35)
AST: 33 U/L (ref 0–37)
Albumin: 3.8 g/dL (ref 3.5–5.2)
Alkaline Phosphatase: 62 U/L (ref 39–117)
Potassium: 3.8 mEq/L (ref 3.5–5.1)
Sodium: 135 mEq/L (ref 135–145)
Total Protein: 7.2 g/dL (ref 6.0–8.3)

## 2013-08-19 MED ORDER — OXYCODONE-ACETAMINOPHEN 5-325 MG PO TABS: 1.0000 | ORAL_TABLET | Freq: Four times a day (QID) | ORAL | Status: DC | PRN

## 2013-08-19 MED ORDER — ALBUTEROL SULFATE (5 MG/ML) 0.5% IN NEBU: 5.0000 mg | INHALATION_SOLUTION | Freq: Once | RESPIRATORY_TRACT | Status: DC

## 2013-08-19 MED ORDER — HYDROCODONE-ACETAMINOPHEN 5-325 MG PO TABS
1.0000 | ORAL_TABLET | Freq: Once | ORAL | Status: AC
  Administered 2013-08-19: 1 via ORAL
  Filled 2013-08-19: qty 1

## 2013-08-19 MED ORDER — AMOXICILLIN 500 MG PO CAPS: 500.0000 mg | ORAL_CAPSULE | Freq: Three times a day (TID) | ORAL | Status: DC

## 2013-08-19 MED ORDER — ALBUTEROL SULFATE (5 MG/ML) 0.5% IN NEBU
5.0000 mg | INHALATION_SOLUTION | Freq: Once | RESPIRATORY_TRACT | Status: AC
  Administered 2013-08-19: 5 mg via RESPIRATORY_TRACT
  Filled 2013-08-19: qty 1

## 2013-08-19 MED ORDER — HYDROCODONE-ACETAMINOPHEN 5-325 MG PO TABS: 1.0000 | ORAL_TABLET | Freq: Once | ORAL | Status: DC

## 2013-08-19 MED ORDER — OXYCODONE-ACETAMINOPHEN 5-325 MG PO TABS
1.0000 | ORAL_TABLET | Freq: Once | ORAL | Status: AC
  Administered 2013-08-19: 1 via ORAL

## 2013-08-19 MED ORDER — OXYCODONE-ACETAMINOPHEN 5-325 MG PO TABS
ORAL_TABLET | ORAL | Status: AC
  Administered 2013-08-19: 1 via ORAL
  Filled 2013-08-19: qty 1

## 2013-08-19 MED ORDER — AZITHROMYCIN 250 MG PO TABS: ORAL_TABLET | ORAL | Status: DC

## 2013-08-19 MED ORDER — ALBUTEROL SULFATE HFA 108 (90 BASE) MCG/ACT IN AERS: 1.0000 | INHALATION_SPRAY | Freq: Four times a day (QID) | RESPIRATORY_TRACT | Status: DC | PRN

## 2013-08-19 NOTE — ED Notes (Signed)
Chest pain, with sob for 30 min , onset when cooking breakfast.  No N/V

## 2013-08-19 NOTE — ED Provider Notes (Signed)
CSN: [id number]     Arrival date & time 08/19/13  1202 History   First MD Initiated Contact with Patient 08/19/13 1323     Chief Complaint  Patient presents with  . Chest Pain   (Consider location/radiation/quality/duration/timing/severity/associated sxs/prior Treatment) Patient is a 42 y.o. female presenting with chest pain. The history is provided by the patient (the pt complains of a cough with some chest pain).  Chest Pain Pain location:  R chest Pain quality: aching   Pain radiates to:  Does not radiate Pain radiates to the back: no   Pain severity:  Moderate Timing:  Intermittent Progression:  Unchanged Context: movement   Associated symptoms: cough   Associated symptoms: no abdominal pain, no back pain, no fatigue and no headache     Past Medical History  Diagnosis Date  . GERD (gastroesophageal reflux disease)   . Anxiety   . Back pain   . Arthritis   . Chronic diarrhea   . Irritable bowel syndrome    Past Surgical History  Procedure Laterality Date  . Cesarean section    . Breast enhancement surgery    . Colonoscopy  05/27/2011    WITH SNARE  . Tubal ligation    . Heart valve problme     History reviewed. No pertinent family history. History  Substance Use Topics  . Smoking status: Current Every Day Smoker  . Smokeless tobacco: Not on file  . Alcohol Use: Yes     Comment: occasionally   OB History   Grav Para Term Preterm Abortions TAB SAB Ect Mult Living                 Review of Systems  Constitutional: Negative for appetite change and fatigue.  HENT: Negative for congestion, sinus pressure and ear discharge.   Eyes: Negative for discharge.  Respiratory: Positive for cough.   Cardiovascular: Positive for chest pain.  Gastrointestinal: Negative for abdominal pain and diarrhea.  Genitourinary: Negative for frequency and hematuria.  Musculoskeletal: Negative for back pain.  Skin: Negative for rash.  Neurological: Negative for seizures and  headaches.  Psychiatric/Behavioral: Negative for hallucinations.    Allergies  Flagyl and Penicillins  Home Medications   Current Outpatient Rx  Name  Route  Sig  Dispense  Refill  . ALPRAZolam (XANAX) 1 MG tablet   Oral   Take 1 mg by mouth 3 (three) times daily.          Marland Kitchen aspirin 81 MG tablet   Oral   Take 81 mg by mouth daily.           . Multiple Vitamin (MULTIVITAMIN WITH MINERALS) TABS tablet   Oral   Take 1 tablet by mouth daily.         Marland Kitchen omeprazole (PRILOSEC) 40 MG capsule   Oral   Take 40 mg by mouth daily.           Marland Kitchen albuterol (PROVENTIL HFA;VENTOLIN HFA) 108 (90 BASE) MCG/ACT inhaler   Inhalation   Inhale 1-2 puffs into the lungs every 6 (six) hours as needed for wheezing.   1 Inhaler   0   . amoxicillin (AMOXIL) 500 MG capsule   Oral   Take 1 capsule (500 mg total) by mouth 3 (three) times daily.   21 capsule   0   . oxyCODONE-acetaminophen (PERCOCET/ROXICET) 5-325 MG per tablet   Oral   Take 1 tablet by mouth every 6 (six) hours as needed for pain.   12  tablet   0    BP 132/88  Pulse 68  Temp(Src) 98 F (36.7 C) (Oral)  Resp 19  Ht 5' 1.75" (1.568 m)  Wt 119 lb (53.978 kg)  BMI 21.95 kg/m2  SpO2 97%  LMP 08/12/2013 Physical Exam  Constitutional: She is oriented to person, place, and time. She appears well-developed.  HENT:  Head: Normocephalic.  Eyes: Conjunctivae and EOM are normal. No scleral icterus.  Neck: Neck supple. No thyromegaly present.  Cardiovascular: Normal rate and regular rhythm.  Exam reveals no gallop and no friction rub.   No murmur heard. Pulmonary/Chest: No stridor. She has no wheezes. She has no rales. She exhibits no tenderness.  Abdominal: She exhibits no distension. There is no tenderness. There is no rebound.  Musculoskeletal: Normal range of motion. She exhibits no edema.  Lymphadenopathy:    She has no cervical adenopathy.  Neurological: She is oriented to person, place, and time. Coordination  normal.  Skin: No rash noted. No erythema.  Psychiatric: She has a normal mood and affect. Her behavior is normal.    ED Course  Procedures (including critical care time) Labs Review Labs Reviewed  CBC WITH DIFFERENTIAL  COMPREHENSIVE METABOLIC PANEL  TROPONIN I   Imaging Review Dg Chest 2 View  08/19/2013   CLINICAL DATA:  Chest pain, shortness of breast.  EXAM: CHEST  2 VIEW  COMPARISON:  07/24/2012  FINDINGS: The heart size and mediastinal contours are within normal limits. Both lungs are clear. The visualized skeletal structures are unremarkable.  IMPRESSION: No active cardiopulmonary disease.   Electronically Signed   By: Charlett Nose M.D.   On: 08/19/2013 12:49    MDM   1. Bronchitis   2. Bronchospasm       Benny Lennert, MD 08/19/13 [id number]

## 2013-10-04 ENCOUNTER — Emergency Department (HOSPITAL_COMMUNITY): Payer: Medicaid Other

## 2013-10-04 ENCOUNTER — Encounter (HOSPITAL_COMMUNITY): Payer: Self-pay | Admitting: Emergency Medicine

## 2013-10-04 ENCOUNTER — Emergency Department (HOSPITAL_COMMUNITY)
Admission: EM | Admit: 2013-10-04 | Discharge: 2013-10-04 | Disposition: A | Discharge status: Home / Self Care | Payer: Medicaid Other | Attending: Emergency Medicine | Admitting: Emergency Medicine

## 2013-10-04 DIAGNOSIS — F411 Generalized anxiety disorder: Secondary | ICD-10-CM | POA: Insufficient documentation

## 2013-10-04 DIAGNOSIS — Z79899 Other long term (current) drug therapy: Secondary | ICD-10-CM | POA: Insufficient documentation

## 2013-10-04 DIAGNOSIS — M129 Arthropathy, unspecified: Secondary | ICD-10-CM | POA: Insufficient documentation

## 2013-10-04 DIAGNOSIS — F17200 Nicotine dependence, unspecified, uncomplicated: Secondary | ICD-10-CM | POA: Insufficient documentation

## 2013-10-04 DIAGNOSIS — K921 Melena: Secondary | ICD-10-CM | POA: Insufficient documentation

## 2013-10-04 DIAGNOSIS — K219 Gastro-esophageal reflux disease without esophagitis: Secondary | ICD-10-CM | POA: Insufficient documentation

## 2013-10-04 DIAGNOSIS — R112 Nausea with vomiting, unspecified: Secondary | ICD-10-CM

## 2013-10-04 DIAGNOSIS — Z792 Long term (current) use of antibiotics: Secondary | ICD-10-CM | POA: Insufficient documentation

## 2013-10-04 DIAGNOSIS — Z860100 Personal history of colon polyps, unspecified: Secondary | ICD-10-CM | POA: Insufficient documentation

## 2013-10-04 DIAGNOSIS — Z7982 Long term (current) use of aspirin: Secondary | ICD-10-CM | POA: Insufficient documentation

## 2013-10-04 DIAGNOSIS — Z880 Allergy status to penicillin: Secondary | ICD-10-CM | POA: Insufficient documentation

## 2013-10-04 DIAGNOSIS — Z3202 Encounter for pregnancy test, result negative: Secondary | ICD-10-CM | POA: Insufficient documentation

## 2013-10-04 DIAGNOSIS — R109 Unspecified abdominal pain: Secondary | ICD-10-CM

## 2013-10-04 LAB — HEPATIC FUNCTION PANEL
ALT: 17 U/L (ref 0–35)
Albumin: 3.5 g/dL (ref 3.5–5.2)
Bilirubin, Direct: 0.1 mg/dL (ref 0.0–0.3)
Indirect Bilirubin: 0.3 mg/dL (ref 0.3–0.9)
Total Protein: 6.8 g/dL (ref 6.0–8.3)

## 2013-10-04 LAB — URINALYSIS, ROUTINE W REFLEX MICROSCOPIC
Glucose, UA: NEGATIVE mg/dL
Leukocytes, UA: NEGATIVE
Protein, ur: NEGATIVE mg/dL
Specific Gravity, Urine: 1.015 (ref 1.005–1.030)
Urobilinogen, UA: 0.2 mg/dL (ref 0.0–1.0)

## 2013-10-04 LAB — CBC WITH DIFFERENTIAL/PLATELET
Basophils Relative: 0 % (ref 0–1)
Eosinophils Absolute: 0.2 10*3/uL (ref 0.0–0.7)
HCT: 37 % (ref 36.0–46.0)
Hemoglobin: 12.3 g/dL (ref 12.0–15.0)
MCH: 33.1 pg (ref 26.0–34.0)
MCHC: 33.2 g/dL (ref 30.0–36.0)
Monocytes Absolute: 0.9 10*3/uL (ref 0.1–1.0)
Monocytes Relative: 9 % (ref 3–12)

## 2013-10-04 LAB — BASIC METABOLIC PANEL
CO2: 30 mEq/L (ref 19–32)
Calcium: 9 mg/dL (ref 8.4–10.5)
Potassium: 3.5 mEq/L (ref 3.5–5.1)
Sodium: 136 mEq/L (ref 135–145)

## 2013-10-04 LAB — URINE MICROSCOPIC-ADD ON

## 2013-10-04 LAB — LIPASE, BLOOD: Lipase: 19 U/L (ref 11–59)

## 2013-10-04 LAB — PROTIME-INR
INR: 1.03 (ref 0.00–1.49)
Prothrombin Time: 13.3 seconds (ref 11.6–15.2)

## 2013-10-04 LAB — TYPE AND SCREEN
ABO/RH(D): O POS
Antibody Screen: NEGATIVE

## 2013-10-04 LAB — PREGNANCY, URINE: Preg Test, Ur: NEGATIVE

## 2013-10-04 MED ORDER — HYDROMORPHONE HCL PF 1 MG/ML IJ SOLN
1.0000 mg | Freq: Once | INTRAMUSCULAR | Status: AC
  Administered 2013-10-04: 1 mg via INTRAVENOUS
  Filled 2013-10-04: qty 1

## 2013-10-04 MED ORDER — ONDANSETRON 8 MG PO TBDP: 8.0000 mg | ORAL_TABLET | Freq: Three times a day (TID) | ORAL | Status: DC | PRN

## 2013-10-04 MED ORDER — SODIUM CHLORIDE 0.9 % IV SOLN
1000.0000 mL | Freq: Once | INTRAVENOUS | Status: AC
  Administered 2013-10-04: 1000 mL via INTRAVENOUS

## 2013-10-04 MED ORDER — IOHEXOL 300 MG/ML  SOLN
100.0000 mL | Freq: Once | INTRAMUSCULAR | Status: AC | PRN
  Administered 2013-10-04: 100 mL via INTRAVENOUS

## 2013-10-04 MED ORDER — SODIUM CHLORIDE 0.9 % IV SOLN
1000.0000 mL | INTRAVENOUS | Status: DC
  Administered 2013-10-04: 1000 mL via INTRAVENOUS

## 2013-10-04 MED ORDER — ONDANSETRON HCL 4 MG/2ML IJ SOLN
4.0000 mg | Freq: Once | INTRAMUSCULAR | Status: AC
  Administered 2013-10-04: 4 mg via INTRAVENOUS

## 2013-10-04 MED ORDER — PANTOPRAZOLE SODIUM 20 MG PO TBEC: 40.0000 mg | DELAYED_RELEASE_TABLET | Freq: Every day | ORAL | Status: DC

## 2013-10-04 MED ORDER — SODIUM CHLORIDE 0.9 % IV SOLN
80.0000 mg | Freq: Once | INTRAVENOUS | Status: AC
  Administered 2013-10-04: 80 mg via INTRAVENOUS
  Filled 2013-10-04: qty 80

## 2013-10-04 MED ORDER — IOHEXOL 300 MG/ML  SOLN
50.0000 mL | Freq: Once | INTRAMUSCULAR | Status: AC | PRN
  Administered 2013-10-04: 50 mL via ORAL

## 2013-10-04 MED ORDER — ONDANSETRON HCL 4 MG/2ML IJ SOLN
4.0000 mg | Freq: Once | INTRAMUSCULAR | Status: DC
  Filled 2013-10-04: qty 2

## 2013-10-04 NOTE — ED Provider Notes (Signed)
CSN: [id number]     Arrival date & time 10/04/13  1058 History  This chart was scribed for Hilario Quarry, MD by Bennett Scrape, ED Scribe. This patient was seen in room APA05/APA05 and the patient's care was started at 11:40 AM.     Chief Complaint  Patient presents with  . Emesis  . Abdominal Pain  . Rectal Bleeding    Patient is a 42 y.o. female presenting with abdominal pain. The history is provided by the patient. No language interpreter was used.  Abdominal Pain Pain location:  LLQ Pain quality: sharp   Pain radiates to:  RLQ Pain severity:  Moderate Duration:  3 days Timing:  Constant Progression:  Unchanged Relieved by:  Nothing Worsened by:  Nothing tried Ineffective treatments:  None tried Associated symptoms: hematochezia and vomiting   Associated symptoms: no diarrhea, no hematemesis, no vaginal bleeding and no vaginal discharge   Risk factors: no alcohol abuse     HPI Comments: MAIGEN MOZINGO is a 42 y.o. female who presents to the Emergency Department complaining of LLQ abdominal pain described as sharp for the past 3 days with the development of 5 to 6 episodes of emesis and 4 episodes of hematochezia today. She states that the vomit, which has been clear to yellow, and the BMs with bright red blood occur at the same time. First episode was at 4 AM this morning. Last episode was at 7 AM this morning. She also reported lightheadedness since the onset. She has been evlauated by a GI specialist in ED 2 years ago for the same that showed polyps on a colonoscopy. She states that she did not have insurance at the time and could not f/u. She has pain medication prescribed for her back which she has been taking with no improvement. She denies any urinary symptoms or vaginal symptoms. She denies any prior diverticulitis episodes or any prior abdominal surgeries. LNMP was 09/01/13.  She is currently taking ASA for 'my heart' but is unable to elaborate. She reports occasional  alcohol use.  PCP is Dr. Lysbeth Galas  Past Medical History  Diagnosis Date  . GERD (gastroesophageal reflux disease)   . Anxiety   . Back pain   . Arthritis   . Chronic diarrhea   . Irritable bowel syndrome    Past Surgical History  Procedure Laterality Date  . Cesarean section    . Breast enhancement surgery    . Colonoscopy  05/27/2011    WITH SNARE  . Tubal ligation    . Heart valve problme     No family history on file. History  Substance Use Topics  . Smoking status: Current Every Day Smoker    Types: Cigarettes  . Smokeless tobacco: Not on file  . Alcohol Use: Yes     Comment: occasionally   No OB history provided.  Review of Systems  Gastrointestinal: Positive for vomiting, abdominal pain and hematochezia. Negative for diarrhea and hematemesis.  Genitourinary: Negative for vaginal bleeding and vaginal discharge.  All other systems reviewed and are negative.    Allergies  Flagyl and Penicillins  Home Medications   Current Outpatient Rx  Name  Route  Sig  Dispense  Refill  . albuterol (PROVENTIL HFA;VENTOLIN HFA) 108 (90 BASE) MCG/ACT inhaler   Inhalation   Inhale 1-2 puffs into the lungs every 6 (six) hours as needed for wheezing.   1 Inhaler   0   . ALPRAZolam (XANAX) 1 MG tablet   Oral  Take 1 mg by mouth 3 (three) times daily.          Marland Kitchen amoxicillin (AMOXIL) 500 MG capsule   Oral   Take 1 capsule (500 mg total) by mouth 3 (three) times daily.   21 capsule   0   . aspirin 81 MG tablet   Oral   Take 81 mg by mouth daily.           Marland Kitchen azithromycin (ZITHROMAX Z-PAK) 250 MG tablet      2 po day one, then 1 daily x 4 days   5 tablet   0   . Multiple Vitamin (MULTIVITAMIN WITH MINERALS) TABS tablet   Oral   Take 1 tablet by mouth daily.         Marland Kitchen omeprazole (PRILOSEC) 40 MG capsule   Oral   Take 40 mg by mouth daily.           Marland Kitchen oxyCODONE-acetaminophen (PERCOCET/ROXICET) 5-325 MG per tablet   Oral   Take 1 tablet by mouth every 6  (six) hours as needed for pain.   12 tablet   0   . oxyCODONE-acetaminophen (PERCOCET/ROXICET) 5-325 MG per tablet   Oral   Take 1 tablet by mouth every 6 (six) hours as needed for pain.   20 tablet   0    Triage Vitals: BP 128/72  Pulse 80  Temp(Src) 98.5 F (36.9 C) (Oral)  Resp 16  Ht 5\' 1"  (1.549 m)  Wt 120 lb (54.432 kg)  BMI 22.69 kg/m2  SpO2 100%  LMP 09/01/2013  Physical Exam  Nursing note and vitals reviewed. Constitutional: She is oriented to person, place, and time. She appears well-developed and well-nourished. No distress.  HENT:  Head: Normocephalic and atraumatic.  Eyes: EOM are normal.  Neck: Neck supple. No tracheal deviation present.  Cardiovascular: Normal rate and regular rhythm.   Pulmonary/Chest: Effort normal and breath sounds normal. No respiratory distress.  Abdominal: Soft. There is tenderness (LLQ). There is no rebound and no guarding.  Genitourinary:  No stool in the rectal vault, no gross blood.   Musculoskeletal: Normal range of motion.  Neurological: She is alert and oriented to person, place, and time.  Skin: Skin is warm and dry.  Psychiatric: She has a normal mood and affect. Her behavior is normal.    ED Course  Procedures (including critical care time)  DIAGNOSTIC STUDIES: Oxygen Saturation is 100% on room air, normal by my interpretation.    COORDINATION OF CARE: 11:47 AM-Discussed treatment plan which includes CT of abdomen, CBC panel, BMP and UA with pt at bedside and pt agreed to plan.   Labs Review Labs Reviewed  CBC WITH DIFFERENTIAL  BASIC METABOLIC PANEL  URINALYSIS, ROUTINE W REFLEX MICROSCOPIC   Imaging Review No results found.  EKG Interpretation   None       MDM  No diagnosis found. Results for orders placed during the hospital encounter of 10/04/13  CBC WITH DIFFERENTIAL      Result Value Range   WBC 10.6 (*) 4.0 - 10.5 K/uL   RBC 3.72 (*) 3.87 - 5.11 MIL/uL   Hemoglobin 12.3  12.0 - 15.0 g/dL    HCT 16.1  09.6 - 04.5 %   MCV 99.5  78.0 - 100.0 fL   MCH 33.1  26.0 - 34.0 pg   MCHC 33.2  30.0 - 36.0 g/dL   RDW 40.9  81.1 - 91.4 %   Platelets 311  150 - 400  K/uL   Neutrophils Relative % 58  43 - 77 %   Neutro Abs 6.1  1.7 - 7.7 K/uL   Lymphocytes Relative 32  12 - 46 %   Lymphs Abs 3.4  0.7 - 4.0 K/uL   Monocytes Relative 9  3 - 12 %   Monocytes Absolute 0.9  0.1 - 1.0 K/uL   Eosinophils Relative 2  0 - 5 %   Eosinophils Absolute 0.2  0.0 - 0.7 K/uL   Basophils Relative 0  0 - 1 %   Basophils Absolute 0.0  0.0 - 0.1 K/uL  BASIC METABOLIC PANEL      Result Value Range   Sodium 136  135 - 145 mEq/L   Potassium 3.5  3.5 - 5.1 mEq/L   Chloride 98  96 - 112 mEq/L   CO2 30  19 - 32 mEq/L   Glucose, Bld 114 (*) 70 - 99 mg/dL   BUN 7  6 - 23 mg/dL   Creatinine, Ser 7.82  0.50 - 1.10 mg/dL   Calcium 9.0  8.4 - 95.6 mg/dL   GFR calc non Af Amer >90  >90 mL/min   GFR calc Af Amer >90  >90 mL/min  URINALYSIS, ROUTINE W REFLEX MICROSCOPIC      Result Value Range   Color, Urine STRAW (*) YELLOW   APPearance CLEAR  CLEAR   Specific Gravity, Urine 1.015  1.005 - 1.030   pH 7.0  5.0 - 8.0   Glucose, UA NEGATIVE  NEGATIVE mg/dL   Hgb urine dipstick SMALL (*) NEGATIVE   Bilirubin Urine NEGATIVE  NEGATIVE   Ketones, ur NEGATIVE  NEGATIVE mg/dL   Protein, ur NEGATIVE  NEGATIVE mg/dL   Urobilinogen, UA 0.2  0.0 - 1.0 mg/dL   Nitrite NEGATIVE  NEGATIVE   Leukocytes, UA NEGATIVE  NEGATIVE  HEPATIC FUNCTION PANEL      Result Value Range   Total Protein 6.8  6.0 - 8.3 g/dL   Albumin 3.5  3.5 - 5.2 g/dL   AST 18  0 - 37 U/L   ALT 17  0 - 35 U/L   Alkaline Phosphatase 58  39 - 117 U/L   Total Bilirubin 0.4  0.3 - 1.2 mg/dL   Bilirubin, Direct 0.1  0.0 - 0.3 mg/dL   Indirect Bilirubin 0.3  0.3 - 0.9 mg/dL  LIPASE, BLOOD      Result Value Range   Lipase 19  11 - 59 U/L  PREGNANCY, URINE      Result Value Range   Preg Test, Ur NEGATIVE  NEGATIVE  URINE MICROSCOPIC-ADD ON      Result  Value Range   Squamous Epithelial / LPF FEW (*) RARE   WBC, UA 3-6  <3 WBC/hpf   RBC / HPF 3-6  <3 RBC/hpf   Bacteria, UA FEW (*) RARE  TYPE AND SCREEN      Result Value Range   ABO/RH(D) O POS     Antibody Screen NEG     Sample Expiration 10/07/2013     Ct Abdomen Pelvis W Contrast  10/04/2013   CLINICAL DATA:  Abdominal pain. Rectal bleeding.  EXAM: CT ABDOMEN AND PELVIS WITH CONTRAST  TECHNIQUE: Multidetector CT imaging of the abdomen and pelvis was performed using the standard protocol following bolus administration of intravenous contrast.  CONTRAST:  50mL OMNIPAQUE IOHEXOL 300 MG/ML SOLN, OMNIPAQUE IOHEXOL 300 MG/ML SOLN  COMPARISON:  03/05/2011  FINDINGS: Vaginal and rectal prolapse.  No extra luminal  bowel inflammatory process or free intraperitoneal air. No bowel containing hernia.  Periportal edema. Etiology indeterminate. This can be seen with aggressive hydration or hepatitis. This may explain appearance of the minimal amount of fluid in the gallbladder fossa. No gallstones. If gallbladder abnormality were of concern ultrasound may be considered.  No worrisome focal hepatic, splenic, pancreatic, renal or adrenal lesion.  Mild calcification of the lower abdominal aorta consistent with advanced atherosclerosis for the patient's age.  Prominent uterus may reflect underlying fibroid. No adnexal mass identified.  Urinary bladder is once again noted to be dilated as are ureters but without evidence of hydronephrosis. There may be a component of bladder outlet obstruction.  No bony destructive lesion.  No adenopathy.  Lung bases clear.  Breast prosthesis partially visualized.  IMPRESSION: Vaginal and rectal prolapse.  No extra luminal bowel inflammatory process or free intraperitoneal air.  Periportal edema. Etiology indeterminate. This can be seen with aggressive hydration or hepatitis. This may explain appearance of the minimal amount of fluid in the gallbladder fossa. No gallstones. If  gallbladder abnormality were of concern ultrasound may be considered.  Mild calcification of the lower abdominal aorta consistent with advanced atherosclerosis for the patient's age.  Prominent uterus may reflect underlying fibroid.  Urinary bladder is once again noted to be dilated as are ureters but without evidence of hydronephrosis. There may be a component of bladder outlet obstruction.   Electronically Signed   By: Bridgett Larsson M.D.   On: 10/04/2013 14:26  Patient with nausea, vomiting , diarrhea for several days with some lower quadrant ttp. CT without acute abnormality.  Djiscussed with patient periportal edema.  She will be given zofran and placed on clear liquids as I do not want her to take acetaminophen and she is advised.  She will be placed on protonix.  She is given gi referral and advised recheck if worse at any time especially bleeding, fever, or worsening. Pain.    I personally performed the services described in this documentation, which was scribed in my presence. The recorded information has been reviewed and considered.    Hilario Quarry, MD 10/04/13 [phone]

## 2013-10-04 NOTE — ED Notes (Signed)
Pt states lower abdominal pain x 3 days. Vomiting since 0100 and states four episodes of pure blood with BM. Also states headache and pressure pain to rectum.

## 2013-10-04 NOTE — ED Notes (Signed)
POC occult blood completed with a negative result.

## 2013-10-04 NOTE — ED Notes (Signed)
Pt states contrast is making in her sick. EDP aware .Order for Zofran

## 2013-10-07 LAB — URINE CULTURE: Colony Count: >=100000

## 2013-10-08 NOTE — ED Notes (Signed)
+   Urine No treatment needed per [facility].

## 2013-11-07 ENCOUNTER — Ambulatory Visit (INDEPENDENT_AMBULATORY_CARE_PROVIDER_SITE_OTHER): Payer: Self-pay | Admitting: Internal Medicine

## 2013-11-11 ENCOUNTER — Ambulatory Visit (INDEPENDENT_AMBULATORY_CARE_PROVIDER_SITE_OTHER): Payer: Self-pay | Admitting: Internal Medicine

## 2013-11-12 ENCOUNTER — Encounter (INDEPENDENT_AMBULATORY_CARE_PROVIDER_SITE_OTHER): Payer: Self-pay | Admitting: Internal Medicine

## 2013-11-12 ENCOUNTER — Telehealth (INDEPENDENT_AMBULATORY_CARE_PROVIDER_SITE_OTHER): Payer: Self-pay | Admitting: Internal Medicine

## 2013-11-12 ENCOUNTER — Ambulatory Visit (INDEPENDENT_AMBULATORY_CARE_PROVIDER_SITE_OTHER): Payer: Medicaid Other | Admitting: Internal Medicine

## 2013-11-12 VITALS — BP 104/62 | HR 80 | Temp 98.5°F | Ht 61.75 in | Wt 129.8 lb

## 2013-11-12 DIAGNOSIS — K649 Unspecified hemorrhoids: Secondary | ICD-10-CM | POA: Insufficient documentation

## 2013-11-12 DIAGNOSIS — K589 Irritable bowel syndrome without diarrhea: Secondary | ICD-10-CM

## 2013-11-12 DIAGNOSIS — R110 Nausea: Secondary | ICD-10-CM

## 2013-11-12 DIAGNOSIS — K625 Hemorrhage of anus and rectum: Secondary | ICD-10-CM

## 2013-11-12 MED ORDER — DICYCLOMINE HCL 10 MG PO CAPS: 10.0000 mg | ORAL_CAPSULE | Freq: Three times a day (TID) | ORAL | Status: DC

## 2013-11-12 NOTE — Progress Notes (Signed)
Subjective:     Patient ID: Julie [name], female   DOB: Sep 27, 1971, 42 y.o.   MRN: [id number]  HPI Present today with c/o of nausea. She also says she is having rectal bleeding. She had rectal bleeding in November x 3 episodes. She says she had no diarrhea with the rectal bleeding. She has a hx of IBS,                                She has some nausea.  She tells me sometimes her hemorrhoids are painful at times. She is having 6-10 times a day sometimes. Her stools will be loose. She will have abdominal cramps with the diarrhea. There is no abdominal pain now.  Appetite is good. She has lost about 4 pounds since her last visit in 2013.  No recent antibiotics. She underwent a colonoscopy in 2012 for diarrhea, abdominal cramps, and rectal bleeding.  She was given an Rx for dicyclomine but per Dr. Patty Sermons notes, did not have this filled.  05/27/2011 Colonoscopy with polypectomy: FINAL DIAGNOSES:  1. Normal terminal ileum. No evidence of endoscopic colitis.  2. A 6-7 mm polyp snared from transverse colon.  3. External hemorrhoids.  4. Suspect she has irritable bowel syndrome and rectal bleeding  secondary to hemorrhoids. Biopsy: Tubular adenoma. Noh high grade dysplasia.  Next colonoscopy in 5 yrs.  10/04/2013 CT abdomen/pelvis with CM: IMPRESSION:  Vaginal and rectal prolapse.  No extra luminal bowel inflammatory process or free intraperitoneal  air.  Periportal edema. Etiology indeterminate. This can be seen with  aggressive hydration or hepatitis. This may explain appearance of  the minimal amount of fluid in the gallbladder fossa. No gallstones.  If gallbladder abnormality were of concern ultrasound may be  considered.  Mild calcification of the lower abdominal aorta consistent with  advanced atherosclerosis for the patient's age.  CBC    Component Value Date/Time   WBC 10.6* 10/04/2013 1139   RBC 3.72* 10/04/2013 1139   HGB 12.3 10/04/2013 1139   HCT 37.0 10/04/2013 1139   PLT 311 10/04/2013 1139   MCV 99.5 10/04/2013 1139   MCH 33.1 10/04/2013 1139   MCHC 33.2 10/04/2013 1139   RDW 12.8 10/04/2013 1139   LYMPHSABS 3.4 10/04/2013 1139   MONOABS 0.9 10/04/2013 1139   EOSABS 0.2 10/04/2013 1139   BASOSABS 0.0 10/04/2013 1139    CMP     Component Value Date/Time   NA 136 10/04/2013 1139   K 3.5 10/04/2013 1139   CL 98 10/04/2013 1139   CO2 30 10/04/2013 1139   GLUCOSE 114* 10/04/2013 1139   BUN 7 10/04/2013 1139   CREATININE 0.62 10/04/2013 1139   CALCIUM 9.0 10/04/2013 1139   PROT 6.8 10/04/2013 1200   ALBUMIN 3.5 10/04/2013 1200   AST 18 10/04/2013 1200   ALT 17 10/04/2013 1200   ALKPHOS 58 10/04/2013 1200   BILITOT 0.4 10/04/2013 1200   GFRNONAA >90 10/04/2013 1139   GFRAA >90 10/04/2013 1139    Urinalysis    Component Value Date/Time   COLORURINE STRAW* 10/04/2013 1233   APPEARANCEUR CLEAR 10/04/2013 1233   LABSPEC 1.015 10/04/2013 1233   PHURINE 7.0 10/04/2013 1233   GLUCOSEU NEGATIVE 10/04/2013 1233   HGBUR SMALL* 10/04/2013 1233   BILIRUBINUR NEGATIVE 10/04/2013 1233   KETONESUR NEGATIVE 10/04/2013 1233   PROTEINUR NEGATIVE 10/04/2013 1233   UROBILINOGEN 0.2 10/04/2013 1233   NITRITE NEGATIVE 10/04/2013 1233  LEUKOCYTESUR NEGATIVE 10/04/2013 1233        Review of Systems Current Outpatient Prescriptions  Medication Sig Dispense Refill  . albuterol (PROVENTIL HFA;VENTOLIN HFA) 108 (90 BASE) MCG/ACT inhaler Inhale 1-2 puffs into the lungs every 6 (six) hours as needed for wheezing.  1 Inhaler  0  . ALPRAZolam (XANAX) 1 MG tablet Take 1 mg by mouth 3 (three) times daily.       Marland Kitchen HYDROcodone-acetaminophen (NORCO/VICODIN) 5-325 MG per tablet Take 1 tablet by mouth 3 (three) times daily as needed for pain.      . Multiple Vitamin (MULTIVITAMIN WITH MINERALS) TABS tablet Take 1 tablet by mouth daily.      . pantoprazole (PROTONIX) 20 MG tablet Take 2 tablets (40 mg total) by mouth daily.  30 tablet  0   No current  facility-administered medications for this visit.   Past Medical History  Diagnosis Date  . GERD (gastroesophageal reflux disease)   . Anxiety   . Back pain   . Arthritis   . Chronic diarrhea   . Irritable bowel syndrome    Past Surgical History  Procedure Laterality Date  . Cesarean section    . Breast enhancement surgery    . Colonoscopy  05/27/2011    WITH SNARE  . Tubal ligation    . Heart valve problme     Allergies  Allergen Reactions  . Flagyl [Metronidazole Hcl] Nausea And Vomiting  . Penicillins Other (See Comments)    Caused patient to pass out.         Objective:   Physical Exam Filed Vitals:   11/12/13 1100  BP: 104/62  Pulse: 80  Temp: 98.5 F (36.9 C)  Height: 5' 1.75" (1.568 m)  Weight: 129 lb 12.8 oz (58.877 kg)  Alert and oriented. Skin warm and dry. Oral mucosa is moist.   . Sclera anicteric, conjunctivae is pink. Thyroid not enlarged. No cervical lymphadenopathy. Lungs clear. Heart regular rate and rhythm.  Abdomen is soft. Bowel sounds are positive. No hepatomegaly. No abdominal masses felt. No tenderness.  No edema to lower extremities. Stool brown and guaiac negative.     Assessment:   Diarrhea which I suspect is her IBS. She has cramping with the diarrhea. Rectal bleeding which I suspect is from her hemorrhoids.  She also has some nausea. I would like to rule out GB disease.      Plan:     Rx for Dicyclomine 10mg  TID. OV in 2 months. Am going to get an [place] on her.

## 2013-11-12 NOTE — Addendum Note (Signed)
Addended by: Len Blalock on: 11/12/2013 12:02 PM   Modules accepted: Orders

## 2013-11-12 NOTE — Patient Instructions (Signed)
Rx Dicyclomine 10mg  TID. OV in 2 months

## 2013-11-12 NOTE — Telephone Encounter (Signed)
Message left on her cell phone.

## 2013-11-13 ENCOUNTER — Encounter: Payer: Self-pay | Admitting: *Deleted

## 2013-11-13 ENCOUNTER — Encounter: Payer: Self-pay | Admitting: Internal Medicine

## 2013-11-13 ENCOUNTER — Ambulatory Visit (HOSPITAL_COMMUNITY): Payer: Medicaid Other

## 2013-11-14 ENCOUNTER — Ambulatory Visit: Payer: Self-pay | Admitting: Internal Medicine

## 2013-11-14 ENCOUNTER — Ambulatory Visit (INDEPENDENT_AMBULATORY_CARE_PROVIDER_SITE_OTHER): Payer: Self-pay | Admitting: Internal Medicine

## 2013-11-15 ENCOUNTER — Ambulatory Visit (HOSPITAL_COMMUNITY)
Admission: RE | Admit: 2013-11-15 | Discharge: 2013-11-15 | Disposition: A | Discharge status: Home / Self Care | Payer: Medicaid Other | Source: Ambulatory Visit | Attending: Internal Medicine | Admitting: Internal Medicine

## 2013-11-15 DIAGNOSIS — R1011 Right upper quadrant pain: Secondary | ICD-10-CM | POA: Insufficient documentation

## 2013-11-15 DIAGNOSIS — R110 Nausea: Secondary | ICD-10-CM | POA: Insufficient documentation

## 2013-12-12 ENCOUNTER — Ambulatory Visit: Payer: Self-pay | Admitting: Internal Medicine

## 2013-12-26 ENCOUNTER — Ambulatory Visit (INDEPENDENT_AMBULATORY_CARE_PROVIDER_SITE_OTHER): Payer: Medicaid Other | Admitting: Internal Medicine

## 2013-12-26 ENCOUNTER — Encounter: Payer: Self-pay | Admitting: Internal Medicine

## 2013-12-26 VITALS — BP 110/78 | HR 82 | Ht 61.0 in | Wt 132.7 lb

## 2013-12-26 DIAGNOSIS — F411 Generalized anxiety disorder: Secondary | ICD-10-CM

## 2013-12-26 DIAGNOSIS — R002 Palpitations: Secondary | ICD-10-CM | POA: Insufficient documentation

## 2013-12-26 DIAGNOSIS — F419 Anxiety disorder, unspecified: Secondary | ICD-10-CM | POA: Insufficient documentation

## 2013-12-26 DIAGNOSIS — R079 Chest pain, unspecified: Secondary | ICD-10-CM

## 2013-12-26 DIAGNOSIS — R0789 Other chest pain: Secondary | ICD-10-CM

## 2013-12-26 MED ORDER — PROPRANOLOL HCL ER 60 MG PO CP24: 60.0000 mg | ORAL_CAPSULE | Freq: Every day | ORAL | Status: DC
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
## 2013-12-26 NOTE — Patient Instructions (Signed)
Your physician has requested that you have an exercise tolerance test. For further information please visit [email or web address]. Please also follow instruction sheet, as given.  Your physician has recommended you make the following change in your medication: START taking propranolol 60mg  once daily. This has been sent to your pharmacy.   Your physician recommends that you schedule a follow-up appointment in 2-3 weeks, after your test.

## 2014-01-13 ENCOUNTER — Ambulatory Visit (INDEPENDENT_AMBULATORY_CARE_PROVIDER_SITE_OTHER): Payer: Medicaid Other | Admitting: Internal Medicine

## 2014-01-13 ENCOUNTER — Encounter (INDEPENDENT_AMBULATORY_CARE_PROVIDER_SITE_OTHER): Payer: Self-pay | Admitting: Internal Medicine

## 2014-01-13 VITALS — BP 100/60 | HR 84 | Temp 98.0°F | Ht 62.0 in | Wt 136.6 lb

## 2014-01-13 DIAGNOSIS — K589 Irritable bowel syndrome without diarrhea: Secondary | ICD-10-CM

## 2014-01-13 DIAGNOSIS — R197 Diarrhea, unspecified: Secondary | ICD-10-CM

## 2014-01-13 MED ORDER — HYOSCYAMINE SULFATE ER 0.375 MG PO TB12: 0.3750 mg | ORAL_TABLET | Freq: Every day | ORAL | Status: DC

## 2014-01-13 NOTE — Patient Instructions (Signed)
Levbid or hyoscyamine half to one tablet by mouth before breakfast daily. Physician will contact you with results of blood work. Notify if left upper quadrant abdominal pain gets worse
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
## 2014-01-14 ENCOUNTER — Telehealth (INDEPENDENT_AMBULATORY_CARE_PROVIDER_SITE_OTHER): Payer: Self-pay | Admitting: *Deleted

## 2014-01-14 ENCOUNTER — Other Ambulatory Visit (INDEPENDENT_AMBULATORY_CARE_PROVIDER_SITE_OTHER): Payer: Self-pay | Admitting: Internal Medicine

## 2014-01-14 NOTE — Telephone Encounter (Signed)
Patient called the office and gave Julie [name] the message. Copied are the notes for patient's office visit 01/13/14. Plan:  Discontinue dicyclomine.  Levbid half to one tablet by mouth q. A.m.  Celiac antibiotic panel.  Call if abdominal pain gets worse.  Office visit in 3 months. We will wait to hear from the patient what her [employer] will cover. Dr.Rehman will be made aware.

## 2014-01-14 NOTE — Telephone Encounter (Signed)
Hilaria was not able to get her lab work done yesterday because she didn't have her insurance card. Kalia is planning to get them drawn today. Please let  Dr. Laural Golden know that she was up last night with left side abd pain and swellening. Also the medication that was prescribed to her, [employer] will not cover.  Called [place] back and asked if she would please get a list of medications that [employer] will cover in the same family from her pharmacy. If they would please fax the list would be great.

## 2014-01-15 NOTE — Telephone Encounter (Signed)
We are waiting for the patient to provide [place] a list of what the Insurance will cover.

## 2014-01-21 LAB — OTHER SOLSTAS TEST: Miscellaneous Test: 79742

## 2014-01-22 ENCOUNTER — Other Ambulatory Visit (INDEPENDENT_AMBULATORY_CARE_PROVIDER_SITE_OTHER): Payer: Self-pay | Admitting: *Deleted

## 2014-01-22 ENCOUNTER — Encounter (INDEPENDENT_AMBULATORY_CARE_PROVIDER_SITE_OTHER): Payer: Self-pay | Admitting: *Deleted

## 2014-01-22 DIAGNOSIS — R894 Abnormal immunological findings in specimens from other organs, systems and tissues: Secondary | ICD-10-CM

## 2014-01-22 DIAGNOSIS — R110 Nausea: Secondary | ICD-10-CM

## 2014-01-22 DIAGNOSIS — R197 Diarrhea, unspecified: Secondary | ICD-10-CM

## 2014-01-23 ENCOUNTER — Inpatient Hospital Stay (HOSPITAL_COMMUNITY): Admission: RE | Admit: 2014-01-23 | Payer: Medicaid Other | Source: Ambulatory Visit

## 2014-01-23 LAB — GLIA (IGA/G) + TTG IGA
GLIADIN IGG: 41.5 U/mL — AB (ref <20)
TISSUE TRANSGLUTAMINASE AB, IGA: 56.6 U/mL — AB (ref <20)

## 2014-01-24 ENCOUNTER — Encounter (HOSPITAL_COMMUNITY): Payer: Self-pay | Admitting: Pharmacy Technician

## 2014-02-03 ENCOUNTER — Ambulatory Visit: Payer: Medicaid Other | Admitting: Internal Medicine

## 2014-02-04 ENCOUNTER — Ambulatory Visit (HOSPITAL_COMMUNITY)
Admission: RE | Admit: 2014-02-04 | Discharge: 2014-02-04 | Disposition: A | Discharge status: Home / Self Care | Payer: Medicaid Other | Source: Ambulatory Visit | Attending: Cardiovascular Disease | Admitting: Cardiovascular Disease

## 2014-02-04 DIAGNOSIS — R079 Chest pain, unspecified: Secondary | ICD-10-CM | POA: Insufficient documentation

## 2014-02-04 DIAGNOSIS — R0789 Other chest pain: Secondary | ICD-10-CM

## 2014-02-05 ENCOUNTER — Encounter: Payer: Self-pay | Admitting: Internal Medicine

## 2014-02-05 NOTE — Telephone Encounter (Signed)
Spoke with Julie [name] and she was told by [facility] they would send a list to our office on what her insurance would cover. Asked patient to have them send it again because I have not seen it come through.

## 2014-02-06 ENCOUNTER — Encounter (HOSPITAL_COMMUNITY): Payer: Self-pay | Admitting: Emergency Medicine

## 2014-02-06 ENCOUNTER — Other Ambulatory Visit: Payer: Self-pay

## 2014-02-06 ENCOUNTER — Emergency Department (HOSPITAL_COMMUNITY): Payer: Medicaid Other

## 2014-02-06 ENCOUNTER — Telehealth: Payer: Self-pay | Admitting: *Deleted

## 2014-02-06 ENCOUNTER — Encounter (HOSPITAL_COMMUNITY): Admission: RE | Payer: Self-pay | Source: Ambulatory Visit

## 2014-02-06 ENCOUNTER — Observation Stay (HOSPITAL_COMMUNITY)
Admission: EM | Admit: 2014-02-06 | Discharge: 2014-02-07 | Disposition: A | Discharge status: Home / Self Care | Payer: Medicaid Other | Attending: Cardiology | Admitting: Cardiology

## 2014-02-06 ENCOUNTER — Ambulatory Visit (HOSPITAL_COMMUNITY): Admission: RE | Admit: 2014-02-06 | Payer: Medicaid Other | Source: Ambulatory Visit | Admitting: Internal Medicine

## 2014-02-06 ENCOUNTER — Telehealth: Payer: Self-pay | Admitting: Internal Medicine

## 2014-02-06 ENCOUNTER — Observation Stay (HOSPITAL_COMMUNITY): Payer: Medicaid Other

## 2014-02-06 DIAGNOSIS — R079 Chest pain, unspecified: Secondary | ICD-10-CM

## 2014-02-06 DIAGNOSIS — N6489 Other specified disorders of breast: Secondary | ICD-10-CM | POA: Insufficient documentation

## 2014-02-06 DIAGNOSIS — Z720 Tobacco use: Secondary | ICD-10-CM

## 2014-02-06 DIAGNOSIS — K219 Gastro-esophageal reflux disease without esophagitis: Secondary | ICD-10-CM | POA: Diagnosis present

## 2014-02-06 DIAGNOSIS — R51 Headache: Secondary | ICD-10-CM | POA: Insufficient documentation

## 2014-02-06 DIAGNOSIS — M1990 Unspecified osteoarthritis, unspecified site: Secondary | ICD-10-CM | POA: Diagnosis present

## 2014-02-06 DIAGNOSIS — F17200 Nicotine dependence, unspecified, uncomplicated: Secondary | ICD-10-CM | POA: Insufficient documentation

## 2014-02-06 DIAGNOSIS — R0602 Shortness of breath: Secondary | ICD-10-CM

## 2014-02-06 DIAGNOSIS — Z789 Other specified health status: Secondary | ICD-10-CM

## 2014-02-06 DIAGNOSIS — J9819 Other pulmonary collapse: Secondary | ICD-10-CM | POA: Insufficient documentation

## 2014-02-06 DIAGNOSIS — F411 Generalized anxiety disorder: Secondary | ICD-10-CM | POA: Insufficient documentation

## 2014-02-06 DIAGNOSIS — R197 Diarrhea, unspecified: Secondary | ICD-10-CM | POA: Insufficient documentation

## 2014-02-06 DIAGNOSIS — R072 Precordial pain: Principal | ICD-10-CM | POA: Diagnosis present

## 2014-02-06 DIAGNOSIS — F419 Anxiety disorder, unspecified: Secondary | ICD-10-CM | POA: Diagnosis present

## 2014-02-06 DIAGNOSIS — K589 Irritable bowel syndrome without diarrhea: Secondary | ICD-10-CM | POA: Insufficient documentation

## 2014-02-06 DIAGNOSIS — R002 Palpitations: Secondary | ICD-10-CM

## 2014-02-06 DIAGNOSIS — M129 Arthropathy, unspecified: Secondary | ICD-10-CM | POA: Insufficient documentation

## 2014-02-06 DIAGNOSIS — M549 Dorsalgia, unspecified: Secondary | ICD-10-CM | POA: Insufficient documentation

## 2014-02-06 HISTORY — DX: Nonrheumatic aortic (valve) insufficiency: I35.1

## 2014-02-06 LAB — BASIC METABOLIC PANEL
BUN: 7 mg/dL (ref 6–23)
CHLORIDE: 99 meq/L (ref 96–112)
CO2: 24 mEq/L (ref 19–32)
Calcium: 9.5 mg/dL (ref 8.4–10.5)
Creatinine, Ser: 0.6 mg/dL (ref 0.50–1.10)
GFR calc Af Amer: >90 mL/min (ref >90)
GFR calc non Af Amer: >90 mL/min (ref >90)
GLUCOSE: 98 mg/dL (ref 70–99)
POTASSIUM: 3.3 meq/L — AB (ref 3.7–5.3)
Sodium: 137 mEq/L (ref 137–147)

## 2014-02-06 LAB — TROPONIN I

## 2014-02-06 LAB — CBC
HEMATOCRIT: 40.5 % (ref 36.0–46.0)
Hemoglobin: 14 g/dL (ref 12.0–15.0)
MCH: 33.7 pg (ref 26.0–34.0)
MCHC: 34.6 g/dL (ref 30.0–36.0)
MCV: 97.4 fL (ref 78.0–100.0)
Platelets: 290 10*3/uL (ref 150–400)
RBC: 4.16 MIL/uL (ref 3.87–5.11)
RDW: 12.4 % (ref 11.5–15.5)
WBC: 12.3 10*3/uL — AB (ref 4.0–10.5)

## 2014-02-06 LAB — I-STAT TROPONIN, ED
TROPONIN I, POC: 0 ng/mL (ref 0.00–0.08)
Troponin i, poc: 0 ng/mL (ref 0.00–0.08)

## 2014-02-06 LAB — MRSA PCR SCREENING: MRSA by PCR: NEGATIVE

## 2014-02-06 LAB — PRO B NATRIURETIC PEPTIDE: PRO B NATRI PEPTIDE: 31.8 pg/mL (ref 0–125)

## 2014-02-06 SURGERY — EGD (ESOPHAGOGASTRODUODENOSCOPY)
Anesthesia: Moderate Sedation

## 2014-02-06 MED ORDER — IOHEXOL 350 MG/ML SOLN
100.0000 mL | Freq: Once | INTRAVENOUS | Status: AC | PRN
  Administered 2014-02-06: 100 mL via INTRAVENOUS

## 2014-02-06 MED ORDER — ASPIRIN 81 MG PO CHEW
324.0000 mg | CHEWABLE_TABLET | Freq: Once | ORAL | Status: AC
  Administered 2014-02-06: 324 mg via ORAL
  Filled 2014-02-06: qty 4

## 2014-02-06 MED ORDER — PANTOPRAZOLE SODIUM 40 MG PO TBEC
40.0000 mg | DELAYED_RELEASE_TABLET | Freq: Every day | ORAL | Status: DC
  Administered 2014-02-06 – 2014-02-07 (×2): 40 mg via ORAL
  Filled 2014-02-06 (×2): qty 1

## 2014-02-06 MED ORDER — ADULT MULTIVITAMIN W/MINERALS CH
1.0000 | ORAL_TABLET | Freq: Every day | ORAL | Status: DC
  Administered 2014-02-06 – 2014-02-07 (×2): 1 via ORAL
  Filled 2014-02-06 (×2): qty 1

## 2014-02-06 MED ORDER — HYDROCODONE-ACETAMINOPHEN 7.5-325 MG PO TABS
1.0000 | ORAL_TABLET | Freq: Four times a day (QID) | ORAL | Status: DC | PRN
  Administered 2014-02-06 – 2014-02-07 (×2): 1 via ORAL
  Filled 2014-02-06 (×2): qty 1

## 2014-02-06 MED ORDER — HYDROMORPHONE HCL PF 1 MG/ML IJ SOLN
1.0000 mg | Freq: Once | INTRAMUSCULAR | Status: AC
  Administered 2014-02-06: 1 mg via INTRAVENOUS
  Filled 2014-02-06: qty 1

## 2014-02-06 MED ORDER — ALBUTEROL SULFATE HFA 108 (90 BASE) MCG/ACT IN AERS: 1.0000 | INHALATION_SPRAY | Freq: Four times a day (QID) | RESPIRATORY_TRACT | Status: DC | PRN

## 2014-02-06 MED ORDER — ALPRAZOLAM 0.5 MG PO TABS
1.0000 mg | ORAL_TABLET | Freq: Three times a day (TID) | ORAL | Status: DC
  Administered 2014-02-06 – 2014-02-07 (×3): 1 mg via ORAL
  Filled 2014-02-06 (×3): qty 2

## 2014-02-06 MED ORDER — ZOLPIDEM TARTRATE 5 MG PO TABS: 5.0000 mg | ORAL_TABLET | Freq: Every evening | ORAL | Status: DC | PRN

## 2014-02-06 MED ORDER — HYDROMORPHONE HCL PF 1 MG/ML IJ SOLN
0.5000 mg | INTRAMUSCULAR | Status: DC | PRN
  Administered 2014-02-06 – 2014-02-07 (×4): 0.5 mg via INTRAVENOUS
  Filled 2014-02-06 (×4): qty 1

## 2014-02-06 MED ORDER — ALBUTEROL SULFATE (2.5 MG/3ML) 0.083% IN NEBU: 2.5000 mg | INHALATION_SOLUTION | Freq: Four times a day (QID) | RESPIRATORY_TRACT | Status: DC | PRN

## 2014-02-06 MED ORDER — PROPRANOLOL HCL ER 60 MG PO CP24
60.0000 mg | ORAL_CAPSULE | Freq: Every day | ORAL | Status: DC
  Administered 2014-02-06: 60 mg via ORAL
  Filled 2014-02-06 (×3): qty 1

## 2014-02-06 MED ORDER — NITROGLYCERIN 0.4 MG SL SUBL
SUBLINGUAL_TABLET | SUBLINGUAL | Status: AC
  Administered 2014-02-06: 0.4 mg
  Filled 2014-02-06: qty 1

## 2014-02-06 MED ORDER — MORPHINE SULFATE 4 MG/ML IJ SOLN
4.0000 mg | Freq: Once | INTRAMUSCULAR | Status: AC
  Administered 2014-02-06: 4 mg via INTRAVENOUS
  Filled 2014-02-06: qty 1

## 2014-02-06 MED ORDER — ONDANSETRON HCL 4 MG/2ML IJ SOLN
4.0000 mg | Freq: Once | INTRAMUSCULAR | Status: AC
  Administered 2014-02-06: 4 mg via INTRAVENOUS
  Filled 2014-02-06: qty 2

## 2014-02-06 MED ORDER — ENOXAPARIN SODIUM 40 MG/0.4ML ~~LOC~~ SOLN
40.0000 mg | SUBCUTANEOUS | Status: DC
Start: 2014-02-06 — End: 2014-02-07
  Administered 2014-02-06: 40 mg via SUBCUTANEOUS
  Filled 2014-02-06 (×2): qty 0.4

## 2014-02-06 MED ORDER — OXYCODONE-ACETAMINOPHEN 5-325 MG PO TABS
1.0000 | ORAL_TABLET | Freq: Once | ORAL | Status: AC
  Administered 2014-02-06: 1 via ORAL
  Filled 2014-02-06: qty 1

## 2014-02-06 MED ORDER — MORPHINE SULFATE 2 MG/ML IJ SOLN
2.0000 mg | INTRAMUSCULAR | Status: DC | PRN
  Administered 2014-02-06 – 2014-02-07 (×2): 2 mg via INTRAVENOUS
  Filled 2014-02-06 (×2): qty 1

## 2014-02-06 MED ORDER — ONDANSETRON HCL 4 MG/2ML IJ SOLN
4.0000 mg | Freq: Four times a day (QID) | INTRAMUSCULAR | Status: DC | PRN
  Administered 2014-02-07: 4 mg via INTRAVENOUS
  Filled 2014-02-06: qty 2

## 2014-02-06 MED ORDER — ACETAMINOPHEN 325 MG PO TABS: 650.0000 mg | ORAL_TABLET | ORAL | Status: DC | PRN

## 2014-02-06 NOTE — Telephone Encounter (Signed)
Will need Lexiscan - GXT was non-diagnostic. Poor performance is concerning for ischemia. Would recommend postponing biopsy until we know stress results.  Dr. Debara Pickett

## 2014-02-06 NOTE — H&P (Signed)
History and Physical   Patient ID: Julie [name] MRN: [id number], DOB/AGE: 45-Jun-1972 45 y.o. Date of Encounter: 02/06/2014  Primary Physician: Sherrie Mustache, MD Primary Cardiologist: Hilty  Chief Complaint:  Chest pain  HPI: Julie [name] is a 43 y.o. female with no history of CAD. She has been seen by Dr. Mathis Bud in the past and had a heart cath in 2009 by Dr. Tami Ribas.   She was evaluated in the ER September 2014 for chest pressure, diagnosed with bronchitis. She gets brief, sharp chest pain on a regular basis.  She saw Dr. Debara Pickett in January and a GXT was scheduled for 03/05. She did the stress [name] but did not reach target HR. She developed chest pressure, severe and SOB. These symptoms lasted all day. Yesterday, she did not feel well, but was able to get out and walk around some. Today, she developed chest pressure just after waking. She was to get a GI [name] today, including a biopsy, but called the office and cancelled the [name], coming to the ER as we requested. In the ER, she has received ASA, Dilaudid 1 mg, morphine 4 mg, Zofran 4 mg, Percocet 5/325 mg. She took no medications for the pain prior to admission. She states she is out of her chronic pain medications at home and has been referred to the pain clinic but has not seen them yet. Currently the chest pain is a 9/10. It has been a 10/10.   Past Medical History  Diagnosis Date  . GERD (gastroesophageal reflux disease)   . Anxiety   . Back pain   . Arthritis   . Chronic diarrhea   . Irritable bowel syndrome   . History of nuclear stress [name] 07/15/2008    lexiscan; mild-mod perfusion defect in mid anteroseptal, apical anterior, apical septal regions (attenuation artifiact), prominent gut uptake activity in infero-apical region; post-stress EF 64%; EKG negative for ischemia; patient experienced CP during [name]    Surgical History:  Past Surgical History  Procedure Laterality Date  . Cesarean section    .  Breast enhancement surgery    . Colonoscopy  05/27/2011    snare polypectomy   . Tubal ligation    . Heart valve problme    . Cardiac catheterization  1025//2006    no significant CAD, mild-mod depressed LV systolic function, EF 43-15%, mild-mod AI (Dr. Gerrie Nordmann)   . Transthoracic echocardiogram  03/85    LV systolic function is low-normal; RV is normal in size & function; mild MR, mild TR     I have reviewed the patient's current medications. Prior to Admission medications   Medication Sig Start Date End Date Taking? Authorizing Provider  albuterol (PROVENTIL HFA;VENTOLIN HFA) 108 (90 BASE) MCG/ACT inhaler Inhale 1-2 puffs into the lungs every 6 (six) hours as needed for wheezing. 08/19/13   Maudry Diego, MD  ALPRAZolam Duanne Moron) 1 MG tablet Take 1 mg by mouth 3 (three) times daily.     Historical Provider, MD  HYDROcodone-acetaminophen (NORCO) 7.5-325 MG per tablet Take 1 tablet by mouth every 6 (six) hours as needed for moderate pain.    Historical Provider, MD  Multiple Vitamin (MULTIVITAMIN WITH MINERALS) TABS tablet Take 1 tablet by mouth daily.    Historical Provider, MD  pantoprazole (PROTONIX) 40 MG tablet Take 40 mg by mouth daily.    Historical Provider, MD  propranolol ER (INDERAL LA) 60 MG 24 hr capsule Take 1 capsule (60 mg total) by mouth  daily. 12/26/13   Chrystie NoseKenneth C. Hilty, MD  pseudoephedrine (SUDAFED) 30 MG tablet Take 30 mg by mouth every 4 (four) hours as needed for congestion.    Historical Provider, MD   Scheduled Meds:  Continuous Infusions: PRN Meds:.  Allergies:  Allergies  Allergen Reactions  . Flagyl [Metronidazole Hcl] Nausea And Vomiting  . Penicillins Other (See Comments)    Caused patient to pass out.     History   Social History  . Marital Status: Single    Spouse Name: N/A    Number of Children: 4  . Years of Education: 9   Occupational History  .     Social History Main Topics  . Smoking status: Current Every Day Smoker -- 1.00  packs/day for 28 years    Types: Cigarettes  . Smokeless tobacco: Never Used     Comment: smoking since age 43 - down to 3-4 cigarettes daily (12/26/13)  . Alcohol Use: Yes     Comment: occasionally  . Drug Use: Yes    Special: Marijuana     Comment: occasionally   . Sexual Activity: Not on file   Other Topics Concern  . Not on file   Social History Narrative  . No narrative on file     Family Status  Relation Status Death Age  . Mother Alive     good health  . Father Alive     Diverticulitis  . Sister Alive     good health  . Brother Alive     good health    Review of Systems:   Full 14-point review of systems otherwise negative except as noted above.  Physical Exam: Blood pressure 103/67, pulse 65, temperature 98 F (36.7 C), temperature source Oral, resp. rate 15, last menstrual period 01/23/2014, SpO2 99.00%. General: Well developed, well nourished,female in some distress. Head: Normocephalic, atraumatic, sclera non-icteric, no xanthomas, nares are without discharge. Dentition: poor Neck: No carotid bruits. JVD not elevated. No thyromegally Lungs: Good expansion bilaterally. without wheezes or rhonchi.  Heart: Regular rate and rhythm with S1 S2.  No S3 or S4.  No murmur, no rubs, or gallops appreciated. Abdomen: Soft, non-tender, non-distended with normoactive bowel sounds. No hepatomegaly. No rebound/guarding. No obvious abdominal masses. Msk:  Strength and tone appear normal for age. No joint deformities or effusions, no spine or costo-vertebral angle tenderness. Extremities: No clubbing or cyanosis. No edema.  Distal pedal pulses are 2+ in 4 extrem Neuro: Alert and oriented X 3. Moves all extremities spontaneously. No focal deficits noted. Psych:  Responds to questions appropriately with a slightly anxious affect. Skin: No rashes or lesions noted  Labs:   Lab Results  Component Value Date   WBC 12.3* 02/06/2014   HGB 14.0 02/06/2014   HCT 40.5 02/06/2014   MCV  97.4 02/06/2014   PLT 290 02/06/2014     Recent Labs Lab 02/06/14 1238  NA 137  K 3.3*  CL 99  CO2 24  BUN 7  CREATININE 0.60  CALCIUM 9.5  GLUCOSE 98    Recent Labs  02/06/14 1252  TROPIPOC 0.00   Pro B Natriuretic peptide (BNP)  Date/Time Value Ref Range Status  02/06/2014  4:35 PM 31.8  0 - 125 pg/mL Final  07/24/2012 11:56 AM 137.8* 0 - 125 pg/mL Final    Radiology/Studies: Dg Chest 2 View 02/06/2014   CLINICAL DATA:  Shortness of breath  EXAM: CHEST  2 VIEW  COMPARISON:  CT ABD - PELV W/ CM  dated 10/04/2013; DG CHEST 2 VIEW dated 08/19/2013  FINDINGS: The heart size and mediastinal contours are within normal limits. Both lungs are clear. The visualized skeletal structures are unremarkable.  IMPRESSION: No active cardiopulmonary disease.   Electronically Signed   By: Kathreen Devoid   On: 02/06/2014 13:30   Ct Angio Chest Pe W/cm &/or Wo Cm 02/06/2014   CLINICAL DATA:  Chest pain and shortness of breath. Status post stress [name].  EXAM: CT ANGIOGRAPHY CHEST WITH CONTRAST  TECHNIQUE: Multidetector CT imaging of the chest was performed using the standard protocol during bolus administration of intravenous contrast. Multiplanar CT image reconstructions and MIPs were obtained to evaluate the vascular anatomy.  CONTRAST:  131mL OMNIPAQUE IOHEXOL 350 MG/ML SOLN  COMPARISON:  Chest x-ray from the same day.  FINDINGS: The heart size is normal. Bilateral breast implants are noted. Asymmetric left-sided breast tissue is stable. And no significant mediastinal or axillary adenopathy is present.  Pulmonary arterial opacification is satisfactory. No focal filling defects are evident.  The lung windows demonstrate mild dependent atelectasis bilaterally. No focal nodule, mass, or airspace disease is present. The bone windows and are unremarkable.  Review of the MIP images confirms the above findings.  IMPRESSION: 1. No evidence for pulmonary embolus. 2. Mild dependent atelectasis in the lungs bilaterally. No  other focal airspace disease is present. 3. Asymmetric breast tissue adjacent to the prostheses is stable. Please correlate with mammography.   Electronically Signed   By: Lawrence Santiago M.D.   On: 02/06/2014 17:57   Cardiac Cath: 09/28/2005 Left ventriculogram reveals mild to moderate depressed EF of 35-40% with  global hypokinesis.  The ascending aortography reveals normal ascending aorta, though she does  appear to have mild to moderate aortic insufficiency.  HEMODYNAMICS: Systemic arterial pressure 102/68, LV systemic pressure 98/3,  LVEDP 7.  CONCLUSION:  1. No significant CAD.  2. Mild to moderately depressed LV systolic function.  3. Mild to moderate aortic insufficiency.  Echo: 2009 Mild MR, mild TR, global HK, low-normal LV function  ECG: SR Vent. rate 90 BPM PR interval 146 ms QRS duration 66 ms QT/QTc 362/442 ms P-R-T axes 52 90 65  ASSESSMENT AND PLAN:  Chest pain - admit, continue to cycle enzymes, DVT lovenox, pain control meds. Change to heparin and nitrates if enzymes abnormal.  Headache - Pt is concerned because grandmother died of an aneurysm. Has been having them, worse for the last few days. Will ck CT, avoid nitrates, prn rx.  Otherwise, continue home Rx. Pt was advised we will continue home rx while in-hospital but not write prescriptions for these meds.  Jonetta Speak, PA-C 02/06/2014 7:16 PM Beeper [phone]    Patient seen and discussed with PA Barrett. Chest pain is fairly atypical however she had a recent GXT with exertional chest pain and requires further evaluation. She has several other somatic complaints including multiple areas of pain involving her chest, stomach, head, and legs. She is focused that we control her pain overnight.  Will admit overnight and cycle EKG and cardiac enzymes. Obtain lexican in AM. She describes progressive headaches and family history of cerebral mass, will obtain non-contrast CT head.  Carlyle Dolly  MD  Carlyle Dolly MD

## 2014-02-06 NOTE — ED Notes (Signed)
Per pt sts that she had a stress test earlier in the week and was unable to finish due to her heart rate increasing and SOB. sts hasn't been feeling well and was suppose to have a procedure today but unable. Doctor sent her here. sts some dizziness and weakness.

## 2014-02-06 NOTE — ED Provider Notes (Signed)
CSN: [id number]     Arrival date & time 02/06/14  1215 History   First MD Initiated Contact with Patient 02/06/14 1608     Chief Complaint  Patient presents with  . Shortness of Breath     (Consider location/radiation/quality/duration/timing/severity/associated sxs/prior Treatment) HPI Comments: Patient presents to the ER for evaluation of chest pain. Patient reports left-sided chest pain with difficulty breathing. Pain is continuous. Patient reports that she has the "heart condition". She had a stress test earlier in the week, but could only go for approximately 2 minutes and then has to stop because of discomfort and elevated heart rate. She says that she was supposed to have a GI procedure today, but this did not happen because her doctor intervened and said that she needed her heart looked closer. She was told to come to the ER. In addition to the continuous moderate to severe chest pain, patient feels very weak and dizzy.  Patient is a 43 y.o. female presenting with shortness of breath.  Shortness of Breath Associated symptoms: chest pain     Past Medical History  Diagnosis Date  . GERD (gastroesophageal reflux disease)   . Anxiety   . Back pain   . Arthritis   . Chronic diarrhea   . Irritable bowel syndrome   . History of nuclear stress test 07/15/2008    lexiscan; mild-mod perfusion defect in mid anteroseptal, apical anterior, apical septal regions (attenuation artifiact), prominent gut uptake activity in infero-apical region; post-stress EF 64%; EKG negative for ischemia; patient experienced CP during test   Past Surgical History  Procedure Laterality Date  . Cesarean section    . Breast enhancement surgery    . Colonoscopy  05/27/2011    snare polypectomy   . Tubal ligation    . Heart valve problme    . Cardiac catheterization  1025//2006    no significant CAD, mild-mod depressed LV systolic function, mild-mod AI (Dr. Gerrie Nordmann)   . Transthoracic echocardiogram  08/6788     LV systolic function is low-normal; RV is normal in size & function; mild MR, mild TR   History reviewed. No pertinent family history. History  Substance Use Topics  . Smoking status: Current Every Day Smoker -- 1.00 packs/day for 28 years    Types: Cigarettes  . Smokeless tobacco: Not on file     Comment: smoking since age 51 - down to 3-4 cigarettes daily (12/26/13)  . Alcohol Use: Yes     Comment: occasionally   OB History   Grav Para Term Preterm Abortions TAB SAB Ect Mult Living                 Review of Systems  Respiratory: Positive for shortness of breath.   Cardiovascular: Positive for chest pain.  Neurological: Positive for weakness.  All other systems reviewed and are negative.      Allergies  Flagyl and Penicillins  Home Medications   Current Outpatient Rx  Name  Route  Sig  Dispense  Refill  . albuterol (PROVENTIL HFA;VENTOLIN HFA) 108 (90 BASE) MCG/ACT inhaler   Inhalation   Inhale 1-2 puffs into the lungs every 6 (six) hours as needed for wheezing.   1 Inhaler   0   . ALPRAZolam (XANAX) 1 MG tablet   Oral   Take 1 mg by mouth 3 (three) times daily.          Marland Kitchen HYDROcodone-acetaminophen (NORCO) 7.5-325 MG per tablet   Oral   Take 1 tablet  by mouth every 6 (six) hours as needed for moderate pain.         . Multiple Vitamin (MULTIVITAMIN WITH MINERALS) TABS tablet   Oral   Take 1 tablet by mouth daily.         . pantoprazole (PROTONIX) 40 MG tablet   Oral   Take 40 mg by mouth daily.         . propranolol ER (INDERAL LA) 60 MG 24 hr capsule   Oral   Take 1 capsule (60 mg total) by mouth daily.   30 capsule   6   . pseudoephedrine (SUDAFED) 30 MG tablet   Oral   Take 30 mg by mouth every 4 (four) hours as needed for congestion.          BP 103/67  Pulse 65  Temp(Src) 98 F (36.7 C) (Oral)  Resp 15  SpO2 99%  LMP 01/23/2014 Physical Exam  Constitutional: She is oriented to person, place, and time. She appears  well-developed and well-nourished. No distress.  HENT:  Head: Normocephalic and atraumatic.  Right Ear: Hearing normal.  Left Ear: Hearing normal.  Nose: Nose normal.  Mouth/Throat: Oropharynx is clear and moist and mucous membranes are normal.  Eyes: Conjunctivae and EOM are normal. Pupils are equal, round, and reactive to light.  Neck: Normal range of motion. Neck supple.  Cardiovascular: Regular rhythm, S1 normal and S2 normal.  Exam reveals no gallop and no friction rub.   No murmur heard. Pulmonary/Chest: Effort normal and breath sounds normal. No respiratory distress. She exhibits no tenderness.  Abdominal: Soft. Normal appearance and bowel sounds are normal. There is no hepatosplenomegaly. There is no tenderness. There is no rebound, no guarding, no tenderness at McBurney's point and negative Murphy's sign. No hernia.  Musculoskeletal: Normal range of motion.  Neurological: She is alert and oriented to person, place, and time. She has normal strength. No cranial nerve deficit or sensory deficit. Coordination normal. GCS eye subscore is 4. GCS verbal subscore is 5. GCS motor subscore is 6.  Skin: Skin is warm, dry and intact. No rash noted. No cyanosis.  Psychiatric: She has a normal mood and affect. Her speech is normal and behavior is normal. Thought content normal.    ED Course  Procedures (including critical care time) Labs Review Labs Reviewed  CBC - Abnormal; Notable for the following:    WBC 12.3 (*)    All other components within normal limits  BASIC METABOLIC PANEL - Abnormal; Notable for the following:    Potassium 3.3 (*)    All other components within normal limits  PRO B NATRIURETIC PEPTIDE  I-STAT TROPOININ, ED   Imaging Review Dg Chest 2 View  02/06/2014   CLINICAL DATA:  Shortness of breath  EXAM: CHEST  2 VIEW  COMPARISON:  CT ABD - PELV W/ CM dated 10/04/2013; DG CHEST 2 VIEW dated 08/19/2013  FINDINGS: The heart size and mediastinal contours are within normal  limits. Both lungs are clear. The visualized skeletal structures are unremarkable.  IMPRESSION: No active cardiopulmonary disease.   Electronically Signed   By: Kathreen Devoid   On: 02/06/2014 13:30   Ct Angio Chest Pe W/cm &/or Wo Cm  02/06/2014   CLINICAL DATA:  Chest pain and shortness of breath. Status post stress test.  EXAM: CT ANGIOGRAPHY CHEST WITH CONTRAST  TECHNIQUE: Multidetector CT imaging of the chest was performed using the standard protocol during bolus administration of intravenous contrast. Multiplanar CT image reconstructions  and MIPs were obtained to evaluate the vascular anatomy.  CONTRAST:  144mL OMNIPAQUE IOHEXOL 350 MG/ML SOLN  COMPARISON:  Chest x-ray from the same day.  FINDINGS: The heart size is normal. Bilateral breast implants are noted. Asymmetric left-sided breast tissue is stable. And no significant mediastinal or axillary adenopathy is present.  Pulmonary arterial opacification is satisfactory. No focal filling defects are evident.  The lung windows demonstrate mild dependent atelectasis bilaterally. No focal nodule, mass, or airspace disease is present. The bone windows and are unremarkable.  Review of the MIP images confirms the above findings.  IMPRESSION: 1. No evidence for pulmonary embolus. 2. Mild dependent atelectasis in the lungs bilaterally. No other focal airspace disease is present. 3. Asymmetric breast tissue adjacent to the prostheses is stable. Please correlate with mammography.   Electronically Signed   By: Lawrence Santiago M.D.   On: 02/06/2014 17:57     EKG Interpretation None      MDM   Final diagnoses:  None   Patient presents to the ER for evaluation of chest pain. Patient evaluated by [facility] for chest pain earlier in the week. Patient reports that she had to stop the exam only a couple minutes into her because of pain and severe shortness of breath. Record indicates that the patient got to a peak heart rate of 141. No report is available in the  computer.  Patient continuing to complain of left-sided pain. It is somewhat atypical. It is constant and present at rest. She has associated shortness of breath. Her EKG is unchanged from previous. Troponin was negative. She has a negative BNP. Chest x-ray was unremarkable. CT scan of the chest was performed to further evaluate for the possibility of PE. No PE was seen.  Because it is unclear what the significance of her stress test earlier in the week was, precipitous pain is still not known. Cardiology was therefore consulted and will evaluate the patient and make recommendations for inpatient versus outpatient management.      Orpah Greek, MD 02/06/14 [phone]

## 2014-02-06 NOTE — ED Notes (Signed)
Pt complaining of worsening chest pain. Repeat EKG done and shown to EDP.

## 2014-02-06 NOTE — Telephone Encounter (Signed)
Returned call and pt verified x 2.  Pt stated she was calling b/c she knew she did bad on her stress test and she hadn't heard anything.  Pt informed result not back yet and reviewed by MD.  Asked pt if SOB worse than at last OV.  Pt stated it has been x ~ 1 week.  Pt informed she can come in for evaluation and pt declined.  Stated she is supposed to have a surgical biopsy on her intestines today at 12:30 pm and didn't know if she should tell them she did bad on the stress test.  Pt informed results have not be reviewed by MD to determine if they are bad or not.  Pt informed Dr. Debara Pickett will be notified since SOB is worse and he did mention this in his last note.  Pt verbalized understanding and agreed w/ plan.  Message forwarded to Dr. Debara Pickett for further instructions.  Of note, pt sounded congested and w/o any labored breathing while on the phone.

## 2014-02-06 NOTE — ED Notes (Signed)
Nurse first rounds, explained delay, ice chips to patient. Introduced self. Bilateral breath sounds clear. Pt is alert, oriented and in no distress at this time. Irritated about wait times

## 2014-02-06 NOTE — ED Notes (Signed)
Attempted report x1. 

## 2014-02-06 NOTE — Telephone Encounter (Signed)
Patient was advised to go to [facility] ED this AM by Luetta Nutting, RN. Patient is calling into office, from ED, c/o chest pain/SOB, stating that she needs O2 and she has been waiting 2 hours. She states that an EKG was done and she was told it was normal. RN informed patient that she is in the right place to be treated appropriately for her symptoms and that should her symptoms worsen, she should communicate this with the ED staff. Patient tearful during conversation. RN advised patient that she is in right place and should stay in ED if not feeling well.

## 2014-02-06 NOTE — Progress Notes (Signed)
Patient complained of mid chest pain rated 7/10, treated with 1 sublingual nitroglycerin with no relief and then with dilaudid.  Spoke with MD.  Will continue to reassess and monitor patient.

## 2014-02-06 NOTE — ED Notes (Signed)
Cardiology At bedside 

## 2014-02-06 NOTE — Telephone Encounter (Signed)
Returned call and informed pt per instructions by MD.  Pt informed Dr. Debara Pickett does not recommend her having the surgical procedure w/o having the Lexiscan results as it is risky.  Pt verbalized understanding and understands Scheduling will contact her to set up appt.

## 2014-02-06 NOTE — Telephone Encounter (Signed)
Pt called back.  Stated she has been SOB since having stress test and wanted to know what to do.  Pt informed Dr. Debara Pickett has ordered another test to evaluate cause of SOB.  Pt stated she was told if she still had SOB to go to the hospital.  Pt would like to go to ER.  Pt informed she can to go ER or local Urgent Care for evaluation if she does not want to come into the office.  Pt stated she wanted to go to ER and declined appt in office.

## 2014-02-06 NOTE — Telephone Encounter (Signed)
Pt is scheduled for a biopsy today,had a stress test on Tuesday.She did not do well with it and have been SOB every since.Does she need to tell them about this? Please call asap,procedure is around 12:30 or 1 today.

## 2014-02-07 ENCOUNTER — Observation Stay (HOSPITAL_COMMUNITY): Payer: Medicaid Other

## 2014-02-07 ENCOUNTER — Ambulatory Visit (HOSPITAL_COMMUNITY): Payer: Medicaid Other

## 2014-02-07 ENCOUNTER — Other Ambulatory Visit: Payer: Self-pay

## 2014-02-07 DIAGNOSIS — R002 Palpitations: Secondary | ICD-10-CM

## 2014-02-07 DIAGNOSIS — F17200 Nicotine dependence, unspecified, uncomplicated: Secondary | ICD-10-CM

## 2014-02-07 DIAGNOSIS — K219 Gastro-esophageal reflux disease without esophagitis: Secondary | ICD-10-CM

## 2014-02-07 DIAGNOSIS — F411 Generalized anxiety disorder: Secondary | ICD-10-CM

## 2014-02-07 DIAGNOSIS — K589 Irritable bowel syndrome without diarrhea: Secondary | ICD-10-CM

## 2014-02-07 DIAGNOSIS — Z720 Tobacco use: Secondary | ICD-10-CM

## 2014-02-07 DIAGNOSIS — R079 Chest pain, unspecified: Secondary | ICD-10-CM

## 2014-02-07 LAB — TROPONIN I: Troponin I: <0.3 ng/mL (ref <0.30)

## 2014-02-07 MED ORDER — TECHNETIUM TC 99M SESTAMIBI - CARDIOLITE
30.0000 | Freq: Once | INTRAVENOUS | Status: AC | PRN
  Administered 2014-02-07: 30 via INTRAVENOUS

## 2014-02-07 MED ORDER — REGADENOSON 0.4 MG/5ML IV SOLN
0.4000 mg | Freq: Once | INTRAVENOUS | Status: AC
  Administered 2014-02-07: 0.4 mg via INTRAVENOUS
  Filled 2014-02-07: qty 5

## 2014-02-07 MED ORDER — TECHNETIUM TC 99M SESTAMIBI GENERIC - CARDIOLITE
10.0000 | Freq: Once | INTRAVENOUS | Status: AC | PRN
  Administered 2014-02-07: 10 via INTRAVENOUS

## 2014-02-07 MED ORDER — REGADENOSON 0.4 MG/5ML IV SOLN
INTRAVENOUS | Status: AC
  Filled 2014-02-07: qty 5

## 2014-02-07 MED ORDER — SODIUM CHLORIDE 0.9 % IV BOLUS (SEPSIS)
250.0000 mL | Freq: Once | INTRAVENOUS | Status: AC
  Administered 2014-02-07: 250 mL via INTRAVENOUS

## 2014-02-07 NOTE — Discharge Summary (Signed)
Discharge Summary   Patient ID: Julie [name],  MRN: [id number], DOB/AGE: Dec 14, 1970 43 y.o.  Admit date: 02/06/2014 Discharge date: 02/07/2014  Primary Care Provider: Sherrie Mustache Primary Cardiologist: C. Hilty, MD   Discharge Diagnoses Principal Problem:   Precordial pain  **status post lexiscan cardiolite this admission revealing diffuse decreased attenuation w/o obvious ischemia.  Active Problems:   Anxiety   GERD (gastroesophageal reflux disease)   Arthritis  Allergies Allergies  Allergen Reactions  . Flagyl [Metronidazole Hcl] Nausea And Vomiting  . Penicillins Other (See Comments)    Caused patient to pass out.    Procedures  CT Angio of the Chest with Contrast  3.5.2015  IMPRESSION: 1. No evidence for pulmonary embolus. 2. Mild dependent atelectasis in the lungs bilaterally. No other focal airspace disease is present. 3. Asymmetric breast tissue adjacent to the prostheses is stable. Please correlate with mammography. _____________   CT Head 3.5.2015  IMPRESSION: Negative CT of the head _____________   Carlton Adam Cardiolite 3.6.2015  IMPRESSION: Technically limited study due to bilateral breast implants. I suspect the diffuse decreased attenuation on stress images in the anterior wall and septum are technical. _____________    History of Present Illness  43 y/o female with a prior history of chest pain s/p prior catheterization in 2009 revealing normal coronary arteries and low normal LV function.  It was felt at the time that she may have had either transient coronary vasospasm or perhaps a Takotsubo Cardiomyopathy.  She was recently seen in cardiology clinic secondary to recurrent sharp chest pain, dyspnea on exertion, and palpitations.  She was scheduled for an ETT, which was carried out on 3/3 however she was not able to achieve her target HR and the test was stopped secondary to fatigue, dyspnea, and chest pain.  On 3/5, she was scheduled to  be seen by GI but cancelled the appointment and instead presented to the [facility] ED due to persistent chest pain.  In the ED, ECG was w/o acute changes and initial troponin was normal.  CTA of the chest was performed and was negative for PE.  Head CT also was negative.  She was observed for further evaluation.  Hospital Course  Patient ruled out for MI.  She continued to report constant chest discomfort that was worse with deep breathing.  Despite prolonged symptoms there was no objective evidence of ischemia.  She underwent lexiscan cardiolite stress testing this morning that was a technically limited study due to bilateral breast implants but no obvious ischemia was noted.  As a result, she will be discharged home today in good condition.  Discharge Vitals Blood pressure 102/68, pulse 69, temperature 97.7 F (36.5 C), temperature source Oral, resp. rate 12, height 5\' 2"  (1.575 m), weight 128 lb 15.5 oz (58.5 kg), last menstrual period 01/23/2014, SpO2 99.00%.  Filed Weights   02/06/14 2145  Weight: 128 lb 15.5 oz (58.5 kg)    Labs  CBC  Recent Labs  02/06/14 1238  WBC 12.3*  HGB 14.0  HCT 40.5  MCV 97.4  PLT 998   Basic Metabolic Panel  Recent Labs  02/06/14 1238  NA 137  K 3.3*  CL 99  CO2 24  GLUCOSE 98  BUN 7  CREATININE 0.60  CALCIUM 9.5   Cardiac Enzymes  Recent Labs  02/06/14 1938 02/07/14 0705  TROPONINI <0.30 <0.30   Disposition  Pt is being discharged home today in good condition.  Follow-up Plans & Appointments      Follow-up  Information   Follow up with Sherrie Mustache, MD. (1-2 wks.)    Specialty:  Family Medicine   Contact information:   [address] [place] [address] [phone]      Discharge Medications    Medication List    STOP taking these medications       propranolol ER 60 MG 24 hr capsule  Commonly known as:  INDERAL LA      TAKE these medications       albuterol 108 (90 BASE) MCG/ACT inhaler  Commonly  known as:  PROVENTIL HFA;VENTOLIN HFA  Inhale 1-2 puffs into the lungs every 6 (six) hours as needed for wheezing.     ALPRAZolam 1 MG tablet  Commonly known as:  XANAX  Take 1 mg by mouth 3 (three) times daily.     HYDROcodone-acetaminophen 7.5-325 MG per tablet  Commonly known as:  NORCO  Take 1 tablet by mouth every 6 (six) hours as needed for moderate pain.     multivitamin with minerals Tabs tablet  Take 1 tablet by mouth daily.     pantoprazole 40 MG tablet  Commonly known as:  PROTONIX  Take 40 mg by mouth daily.     pseudoephedrine 30 MG tablet  Commonly known as:  SUDAFED  Take 30 mg by mouth every 4 (four) hours as needed for congestion.     Vortioxetine HBr 10 MG Tabs  Take 10 mg by mouth daily. Using samples from MD while awaiting PA at [facility]       Outstanding Labs/Studies  None  Duration of Discharge Encounter   Greater than 30 minutes including physician time.  Signed, Murray Hodgkins NP 02/07/2014, 4:46 PM

## 2014-02-07 NOTE — Progress Notes (Signed)
UR completed 

## 2014-02-07 NOTE — Progress Notes (Signed)
Discharge instructions and follow-up appointment discussed with patient.   Patient stated if MD can give her a prescription for pain, (Hydrocodone).   Mr. Julie Hefty, NP was paged and informed him of patient's request.  NP called back and was informed of patient's request.  Informed patient that NP can not give her a pain med prescription.  Patient stated, "that's okay.  I can get it from my other doctor."  Patient is discharge to home with boyfriend.  She prefer to walk to the main entrance with her boyfriend.

## 2014-02-07 NOTE — Progress Notes (Addendum)
INITIAL NUTRITION ASSESSMENT  DOCUMENTATION CODES Per approved criteria  -Non-severe (moderate) malnutrition in the context of acute illness or injury   INTERVENTION: Advance diet as medically appropriate, add supplements when/as able RD to follow for nutrition care plan  NUTRITION DIAGNOSIS: Inadequate oral intake related to inability to eat as evidenced by NPO status  Goal: Pt to meet >/= 90% of their estimated nutrition needs   Monitor:  PO diet advancement & intake, weight, labs, I/O's  Reason for Assessment: Malnutrition Screening Tool Report  43 y.o. female  Admitting Dx: chest pain  ASSESSMENT: 43 y.o. female with no history of CAD. She has been seen by Dr. Mathis Bud in the past and had a heart cath in 2009 by Dr. Tami Ribas. She was evaluated in ER September 2014 for chest pressure, diagnosed with bronchitis. She gets brief, sharp chest pain on a regular basis.   She saw Dr. Debara Pickett in January and a GXT was scheduled for 03/05. She did the stress test but did not reach target HR. She developed chest pressure, severe and SOB. These symptoms lasted all day. Yesterday, she did not feel well, but was able to get out and walk around some. Today, she developed chest pressure just after waking. She was to get a GI test today, including a biopsy, but called the office and cancelled the test, coming to the ER as we requested.   In the ER, she has received ASA, Dilaudid 1 mg, morphine 4 mg, Zofran 4 mg, Percocet 5/325 mg. She took no medications for the pain prior to admission. She states she is out of her chronic pain medications at home and has been referred to the pain clinic but has not seen them yet. Currently the chest pain is a 9/10. It has been a 10/10.   Patient currently NPO for stress test; reports her appetite has been decreased for the past 3 weeks; pt reports being under a lot of stress recently; usually consumes 2 meals per day; reports a 9 lb weight loss in < 1 month due to  her "stomach disease;" noted pt with hx of IBS & diarrhea; per weight readings, pt has had a 5% weight loss since 01/13/14 -- significant for time frame; would benefit from addition of supplements when/as able.  Patient meets criteria for moderate (non-severe) malnutrition in the context of acute illness or injury as evidenced by < 75% intake of estimated energy requirement for > 7 days and 5% weight loss x 1 month.  Height: Ht Readings from Last 1 Encounters:  02/06/14 5\' 2"  (1.575 m)    Weight: Wt Readings from Last 1 Encounters:  02/06/14 128 lb 15.5 oz (58.5 kg)    Ideal Body Weight: 110 lb  % Ideal Body Weight: 116%  Wt Readings from Last 10 Encounters:  02/06/14 128 lb 15.5 oz (58.5 kg)  01/13/14 136 lb 9.6 oz (61.961 kg)  12/26/13 132 lb 11.2 oz (60.192 kg)  11/12/13 129 lb 12.8 oz (58.877 kg)  10/04/13 120 lb (54.432 kg)  08/19/13 119 lb (53.978 kg)  08/16/12 134 lb 8 oz (61.009 kg)  12/08/11 127 lb (57.607 kg)    Usual Body Weight: 136 lb  % Usual Body Weight: 94%  BMI:  Body mass index is 23.58 kg/(m^2).  Estimated Nutritional Needs: Kcal: 1600-1800 Protein: 80-90 gm Fluid: 1.6-1.8 L  Skin: Intact  Diet Order: NPO  EDUCATION NEEDS: -No education needs identified at this time  Labs:   Recent Labs Lab 02/06/14 1238  NA 137  K 3.3*  CL 99  CO2 24  BUN 7  CREATININE 0.60  CALCIUM 9.5  GLUCOSE 98    Scheduled Meds: . ALPRAZolam  1 mg Oral TID  . enoxaparin (LOVENOX) injection  40 mg Subcutaneous Q24H  . multivitamin with minerals  1 tablet Oral Daily  . pantoprazole  40 mg Oral Daily  . propranolol ER  60 mg Oral Daily    Continuous Infusions:   Past Medical History  Diagnosis Date  . GERD (gastroesophageal reflux disease)   . Anxiety   . Back pain   . Arthritis   . Chronic diarrhea   . Irritable bowel syndrome   . History of nuclear stress test 07/15/2008    lexiscan; mild-mod perfusion defect in mid anteroseptal, apical anterior,  apical septal regions (attenuation artifiact), prominent gut uptake activity in infero-apical region; post-stress EF 64%; EKG negative for ischemia; patient experienced CP during test  . Aortic insufficiency 2009    Noted at heart cath    Past Surgical History  Procedure Laterality Date  . Cesarean section    . Breast enhancement surgery    . Colonoscopy  05/27/2011    snare polypectomy   . Tubal ligation    . Cardiac catheterization  1025//2006    no significant CAD, mild-mod depressed LV systolic function, EF 27-51%, mild-mod AI (Dr. Gerrie Nordmann)   . Transthoracic echocardiogram  06/16    LV systolic function is low-normal; RV is normal in size & function; mild MR, mild TR    Arthur Holms, RD, LDN Pager #: [phone] After-Hours Pager #: [phone]

## 2014-02-07 NOTE — Progress Notes (Signed)
Patient Name: Julie [name] Date of Encounter: 02/07/2014   Principal Problem:   Precordial pain Active Problems:   Anxiety   GERD (gastroesophageal reflux disease)   Arthritis   SUBJECTIVE  She has had ongoing chest pain that is worse with deep breathing since 11 AM yesterday. For cardiolite today.  CURRENT MEDS . ALPRAZolam  1 mg Oral TID  . enoxaparin (LOVENOX) injection  40 mg Subcutaneous Q24H  . multivitamin with minerals  1 tablet Oral Daily  . pantoprazole  40 mg Oral Daily  . propranolol ER  60 mg Oral Daily   OBJECTIVE  Filed Vitals:   02/06/14 2145 02/06/14 2230 02/07/14 0429 02/07/14 0757  BP:  114/66 85/63   Pulse:  65 69 65  Temp: 98.4 F (36.9 C)  98 F (36.7 C) 98.1 F (36.7 C)  TempSrc: Oral  Oral Oral  Resp:  12 12 14   Height: 5\' 2"  (1.575 m)     Weight: 128 lb 15.5 oz (58.5 kg)     SpO2:  99% 96%    No intake or output data in the 24 hours ending 02/07/14 1048 Filed Weights   02/06/14 2145  Weight: 128 lb 15.5 oz (58.5 kg)    PHYSICAL EXAM  General: Pleasant, NAD. Neuro: Alert and oriented X 3. Moves all extremities spontaneously. Psych: Flat affect. HEENT:  Normal  Neck: Supple without bruits or JVD. Lungs:  Resp regular and unlabored, CTA. Heart: RRR no s3, s4, or murmurs. Abdomen: Soft, non-tender, non-distended, BS + x 4.  Extremities: No clubbing, cyanosis or edema. DP/PT/Radials 2+ and equal bilaterally.  Accessory Clinical Findings  CBC  Recent Labs  02/06/14 1238  WBC 12.3*  HGB 14.0  HCT 40.5  MCV 97.4  PLT 536   Basic Metabolic Panel  Recent Labs  02/06/14 1238  NA 137  K 3.3*  CL 99  CO2 24  GLUCOSE 98  BUN 7  CREATININE 0.60  CALCIUM 9.5   Cardiac Enzymes  Recent Labs  02/06/14 1938 02/07/14 0705  TROPONINI <0.30 <0.30   TELE  Seen in nuc med.  Radiology/Studies  Dg Chest 2 View  02/06/2014   CLINICAL DATA:  Shortness of breath  EXAM: CHEST  2 VIEW IMPRESSION: No active cardiopulmonary  disease.   Electronically Signed   By: Kathreen Devoid   On: 02/06/2014 13:30   Ct Head Wo Contrast  02/06/2014   CLINICAL DATA:  Headaches.  EXAM: CT HEAD WITHOUT CONTRAST    IMPRESSION: Negative CT of the head   Electronically Signed   By: Lawrence Santiago M.D.   On: 02/06/2014 21:29   Ct Angio Chest Pe W/cm &/or Wo Cm  02/06/2014   CLINICAL DATA:  Chest pain and shortness of breath. Status post stress test.  EXAM: CT ANGIOGRAPHY CHEST WITH CONTRAST   IMPRESSION: 1. No evidence for pulmonary embolus. 2. Mild dependent atelectasis in the lungs bilaterally. No other focal airspace disease is present. 3. Asymmetric breast tissue adjacent to the prostheses is stable. Please correlate with mammography.   Electronically Signed   By: Lawrence Santiago M.D.   On: 02/06/2014 17:57    ASSESSMENT AND PLAN  1.  Precordial Pain:  Active pain for the past 24 hrs.  Worse with deep breathing.  Despite prolonged Ss, troponin negative.  For lexiscan cardiolite today. She previously had an ETT (no nuc) on 3/3 but failed to achieve target.  Cont PPI.  2.  GERD:  Cont PPI.  3.  Relative hypotn:  Hydrate this morning.   Signed, Murray Hodgkins NP  Personally seen and examined. Agree with above.  Await NUC results.  If low risk ok for DC.  Candee Furbish, MD

## 2014-02-07 NOTE — Discharge Instructions (Signed)
***  PLEASE REMEMBER TO BRING ALL OF YOUR MEDICATIONS TO EACH OF YOUR FOLLOW-UP OFFICE VISITS.  

## 2014-02-07 NOTE — Discharge Summary (Signed)
Personally seen and examined, agree with above. Reassuring nuclear stress test. Chronic pain syndrome.

## 2014-02-20 ENCOUNTER — Other Ambulatory Visit (INDEPENDENT_AMBULATORY_CARE_PROVIDER_SITE_OTHER): Payer: Self-pay | Admitting: *Deleted

## 2014-02-20 ENCOUNTER — Encounter (HOSPITAL_COMMUNITY): Payer: Medicaid Other

## 2014-02-20 DIAGNOSIS — K219 Gastro-esophageal reflux disease without esophagitis: Secondary | ICD-10-CM

## 2014-02-20 DIAGNOSIS — R110 Nausea: Secondary | ICD-10-CM

## 2014-02-24 ENCOUNTER — Encounter: Payer: Self-pay | Admitting: Internal Medicine

## 2014-02-24 ENCOUNTER — Ambulatory Visit (INDEPENDENT_AMBULATORY_CARE_PROVIDER_SITE_OTHER): Payer: Medicaid Other | Admitting: Internal Medicine

## 2014-02-24 VITALS — BP 122/74 | HR 74 | Ht 62.0 in | Wt 131.1 lb

## 2014-02-24 DIAGNOSIS — K589 Irritable bowel syndrome without diarrhea: Secondary | ICD-10-CM

## 2014-02-24 DIAGNOSIS — F411 Generalized anxiety disorder: Secondary | ICD-10-CM

## 2014-02-24 DIAGNOSIS — R0789 Other chest pain: Secondary | ICD-10-CM

## 2014-02-24 DIAGNOSIS — F419 Anxiety disorder, unspecified: Secondary | ICD-10-CM

## 2014-02-24 DIAGNOSIS — K219 Gastro-esophageal reflux disease without esophagitis: Secondary | ICD-10-CM

## 2014-02-24 NOTE — Patient Instructions (Signed)
Your physician recommends that you schedule a follow-up appointment as needed  
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
## 2014-02-25 ENCOUNTER — Encounter (HOSPITAL_COMMUNITY)
Admission: RE | Disposition: A | Discharge status: Home / Self Care | Payer: Self-pay | Source: Ambulatory Visit | Attending: Internal Medicine

## 2014-02-25 ENCOUNTER — Ambulatory Visit (HOSPITAL_COMMUNITY)
Admission: RE | Admit: 2014-02-25 | Discharge: 2014-02-25 | Disposition: A | Discharge status: Home / Self Care | Payer: Medicaid Other | Source: Ambulatory Visit | Attending: Internal Medicine | Admitting: Internal Medicine

## 2014-02-25 ENCOUNTER — Encounter (HOSPITAL_COMMUNITY): Payer: Self-pay | Admitting: *Deleted

## 2014-02-25 DIAGNOSIS — K319 Disease of stomach and duodenum, unspecified: Secondary | ICD-10-CM

## 2014-02-25 DIAGNOSIS — R110 Nausea: Secondary | ICD-10-CM

## 2014-02-25 DIAGNOSIS — R894 Abnormal immunological findings in specimens from other organs, systems and tissues: Secondary | ICD-10-CM

## 2014-02-25 DIAGNOSIS — R197 Diarrhea, unspecified: Secondary | ICD-10-CM | POA: Insufficient documentation

## 2014-02-25 DIAGNOSIS — K449 Diaphragmatic hernia without obstruction or gangrene: Secondary | ICD-10-CM

## 2014-02-25 DIAGNOSIS — F411 Generalized anxiety disorder: Secondary | ICD-10-CM | POA: Insufficient documentation

## 2014-02-25 DIAGNOSIS — K219 Gastro-esophageal reflux disease without esophagitis: Secondary | ICD-10-CM

## 2014-02-25 DIAGNOSIS — Z79899 Other long term (current) drug therapy: Secondary | ICD-10-CM | POA: Insufficient documentation

## 2014-02-25 HISTORY — PX: BIOPSY: SHX5522

## 2014-02-25 HISTORY — PX: ESOPHAGOGASTRODUODENOSCOPY: SHX5428

## 2014-02-25 SURGERY — EGD (ESOPHAGOGASTRODUODENOSCOPY)
Anesthesia: Moderate Sedation

## 2014-02-25 MED ORDER — SODIUM CHLORIDE 0.9 % IV SOLN
INTRAVENOUS | Status: DC
  Administered 2014-02-25: 07:00:00 via INTRAVENOUS

## 2014-02-25 MED ORDER — PROMETHAZINE HCL 25 MG/ML IJ SOLN
INTRAMUSCULAR | Status: AC
  Filled 2014-02-25: qty 1

## 2014-02-25 MED ORDER — MIDAZOLAM HCL 5 MG/5ML IJ SOLN
INTRAMUSCULAR | Status: AC
  Filled 2014-02-25: qty 15

## 2014-02-25 MED ORDER — MEPERIDINE HCL 50 MG/ML IJ SOLN
INTRAMUSCULAR | Status: AC
  Filled 2014-02-25: qty 1

## 2014-02-25 MED ORDER — MIDAZOLAM HCL 5 MG/5ML IJ SOLN
INTRAMUSCULAR | Status: AC
  Filled 2014-02-25: qty 5

## 2014-02-25 MED ORDER — PROMETHAZINE HCL 25 MG/ML IJ SOLN
INTRAMUSCULAR | Status: DC | PRN
  Administered 2014-02-25: 6.25 mg via INTRAVENOUS

## 2014-02-25 MED ORDER — STERILE WATER FOR IRRIGATION IR SOLN
Status: DC | PRN
  Administered 2014-02-25: 08:00:00

## 2014-02-25 MED ORDER — SODIUM CHLORIDE 0.9 % IJ SOLN
INTRAMUSCULAR | Status: AC
  Filled 2014-02-25: qty 10

## 2014-02-25 MED ORDER — MEPERIDINE HCL 50 MG/ML IJ SOLN
INTRAMUSCULAR | Status: DC | PRN
  Administered 2014-02-25 (×2): 25 mg via INTRAVENOUS

## 2014-02-25 MED ORDER — MIDAZOLAM HCL 5 MG/5ML IJ SOLN
INTRAMUSCULAR | Status: DC | PRN
  Administered 2014-02-25: 2 mg via INTRAVENOUS
  Administered 2014-02-25 (×3): 3 mg via INTRAVENOUS
  Administered 2014-02-25: 2 mg via INTRAVENOUS
  Administered 2014-02-25: 3 mg via INTRAVENOUS
  Administered 2014-02-25: 1 mg via INTRAVENOUS
  Administered 2014-02-25: 3 mg via INTRAVENOUS

## 2014-02-25 MED ORDER — BUTAMBEN-TETRACAINE-BENZOCAINE 2-2-14 % EX AERO
INHALATION_SPRAY | CUTANEOUS | Status: DC | PRN
  Administered 2014-02-25: 2 via TOPICAL

## 2014-02-25 NOTE — H&P (Signed)
Julie [name] is an 43 y.o. female.   Chief Complaint: Patient is  here for EGD and duodenal biopsy. HPI: Patient is 43 year old Caucasian female who has diarrhea abdominal cramps bloating. She has not responded well to therapy for IBS. She had colonoscopy in June 2012 and biopsy from sigmoid colon was negative for microscopic colitis. She was screened for celiac disease and serology is positive. She is therefore undergoing EGD. She's been experiencing intermittent chest pain and was evaluated by cardiologist and workup was negative. Today she is complaining of headache. Family history is negative for celiac disease.  Past Medical History  Diagnosis Date  . GERD (gastroesophageal reflux disease)   . Anxiety   . Back pain   . Arthritis   . Chronic diarrhea   . Irritable bowel syndrome   . History of nuclear stress test 07/15/2008    lexiscan; mild-mod perfusion defect in mid anteroseptal, apical anterior, apical septal regions (attenuation artifiact), prominent gut uptake activity in infero-apical region; post-stress EF 64%; EKG negative for ischemia; patient experienced CP during test  . Aortic insufficiency 2009    Noted at heart cath    Past Surgical History  Procedure Laterality Date  . Cesarean section    . Breast enhancement surgery    . Colonoscopy  05/27/2011    snare polypectomy   . Tubal ligation    . Cardiac catheterization  1025//2006    no significant CAD, mild-mod depressed LV systolic function, EF 97-67%, mild-mod AI (Dr. Gerrie Nordmann)   . Transthoracic echocardiogram  02/4192    LV systolic function is low-normal; RV is normal in size & function; mild MR, mild TR    History reviewed. No pertinent family history. Social History:  reports that she has been smoking Cigarettes.  She has a 28 pack-year smoking history. She has never used smokeless tobacco. She reports that she drinks alcohol. She reports that she uses illicit drugs (Marijuana).  Allergies:  Allergies   Allergen Reactions  . Flagyl [Metronidazole Hcl] Nausea And Vomiting  . Penicillins Other (See Comments)    Caused patient to pass out.     Medications Prior to Admission  Medication Sig Dispense Refill  . ALPRAZolam (XANAX) 1 MG tablet Take 1 mg by mouth 3 (three) times daily.       . Multiple Vitamin (MULTIVITAMIN WITH MINERALS) TABS tablet Take 1 tablet by mouth daily.      . pantoprazole (PROTONIX) 40 MG tablet Take 40 mg by mouth daily.      . pseudoephedrine (SUDAFED) 30 MG tablet Take 30 mg by mouth every 4 (four) hours as needed for congestion.      Marland Kitchen albuterol (PROVENTIL HFA;VENTOLIN HFA) 108 (90 BASE) MCG/ACT inhaler Inhale 1-2 puffs into the lungs every 6 (six) hours as needed for wheezing.  1 Inhaler  0    No results found for this or any previous visit (from the past 48 hour(s)). No results found.  ROS  Blood pressure 114/72, pulse 84, temperature 97.8 F (36.6 C), temperature source Oral, resp. rate 21, height 5\' 2"  (1.575 m), weight 131 lb (59.421 kg), last menstrual period 02/19/2014, SpO2 97.00%. Physical Exam  Constitutional: She appears well-developed and well-nourished.  HENT:  Mouth/Throat: Oropharynx is clear and moist.  Eyes: Conjunctivae are normal. No scleral icterus.  Neck: No thyromegaly present.  Cardiovascular: Normal rate, regular rhythm and normal heart sounds.   No murmur heard. Respiratory: Effort normal and breath sounds normal.  GI: Soft. She exhibits no  distension and no mass. Tenderness:  mild epigastric tenderness.  Musculoskeletal: She exhibits no edema.  Lymphadenopathy:    She has no cervical adenopathy.  Neurological: She is alert.  Skin: Skin is warm and dry.     Assessment/Plan Diarrhea unresponsive to therapy for IBS. Positive serology for celiac disease(elevated tissue transglutaminase antibody IgA antigliadin antibody IgG). EGD with duodenal biopsy.   Julie [name] U 02/25/2014, 7:32 AM

## 2014-02-25 NOTE — Op Note (Signed)
EGD PROCEDURE REPORT  PATIENT:  Julie [name]  MR#:  [id number] Birthdate:  07-Nov-1971, 43 y.o., female Endoscopist:  Dr. Rogene Houston, MD Referred By:  Dr. 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 She was screened for celiac disease. Tissue trueness glutaminase antibody IgA and gliadin antibody IgG are both elevated. She is therefore undergoing EGD with duodenal biopsy. She also has chronic GERD and maintained on PPI.            Informed Consent:  The risks, benefits, alternatives & imponderables which include, but are not limited to, bleeding, infection, perforation, drug reaction and potential missed lesion have been reviewed.  The potential for biopsy, lesion removal, esophageal dilation, etc. have also been discussed.  Questions have been answered.  All parties agreeable.  Please see history & physical in medical record for more information.  Medications:  Demerol 50 mg IV Versed 20 mg IV Promethazine 6.25 mg IV and diluted form. Cetacaine spray topically for oropharyngeal anesthesia  Description of procedure:  The endoscope was introduced through the mouth and advanced to the second portion of the duodenum without difficulty or limitations. The mucosal surfaces were surveyed very carefully during advancement of the scope and upon withdrawal.  Findings:  Esophagus:  Mucosa of the esophagus was normal. The GE junction was unremarkable. GEJ:  36 cm Hiatus:  38 cm Stomach:  Small amount of food debris noted in the stomach. It distended very well with insufflation. Folds in the proximal stomach were normal. Examination of mucosa at body, antrum, pyloric channel, angularis fundus and cardia was normal. Duodenum:  Bulbar and post bulbar mucosa were unremarkable.  Therapeutic/Diagnostic Maneuvers Performed:  Biopsy taken  from post bulbar and bulbar mucosa and submitted together.  Complications:  None  Impression: Small sliding-type hernia without evidence of esophagitis. Small amount of food debris in the stomach with patent pylorus and no evidence of peptic ulcer disease. No endoscopic changes of celiac disease noted involving bulbar and post bulbar mucosa. Biopsy taken.  Recommendations:  Standard instructions given. I will be contacting patient with results of biopsy and further recommendations.  Julie [name]  02/25/2014  8:08 AM  CC: Dr. Sherrie Mustache, MD & Dr. Rayne Du ref. provider found

## 2014-02-25 NOTE — Discharge Instructions (Signed)
Resume usual medications and diet. No driving for 24 hours. Physician will contact you with biopsy results. Esophagogastroduodenoscopy Esophagogastroduodenoscopy (EGD) is a procedure to examine the lining of the esophagus, stomach, and first part of the small intestine (duodenum). A long, flexible, lighted tube with a camera attached (endoscope) is inserted down the throat to view these organs. This procedure is done to detect problems or abnormalities, such as inflammation, bleeding, ulcers, or growths, in order to treat them. The procedure lasts about 5 20 minutes. It is usually an outpatient procedure, but it may need to be performed in emergency cases in the hospital. LET YOUR CAREGIVER KNOW ABOUT:   Allergies to food or medicine.  All medicines you are taking, including vitamins, herbs, eyedrops, and over-the-counter medicines and creams.  Use of steroids (by mouth or creams).  Previous problems you or members of your family have had with the use of anesthetics.  Any blood disorders you have.  Previous surgeries you have had.  Other health problems you have.  Possibility of pregnancy, if this applies. RISKS AND COMPLICATIONS  Generally, EGD is a safe procedure. However, as with any procedure, complications can occur. Possible complications include:  Infection.  Bleeding.  Tearing (perforation) of the esophagus, stomach, or duodenum.  Difficulty breathing or not being able to breath.  Excessive sweating.  Spasms of the larynx.  Slowed heartbeat.  Low blood pressure. BEFORE THE PROCEDURE  Do not eat or drink anything for 6 8 hours before the procedure or as directed by your caregiver.  Ask your caregiver about changing or stopping your regular medicines.  If you wear dentures, be prepared to remove them before the procedure.  Arrange for someone to drive you home after the procedure. PROCEDURE   A vein will be accessed to give medicines and fluids. A medicine to  relax you (sedative) and a pain reliever will be given through that access into the vein.  A numbing medicine (local anesthetic) may be sprayed on your throat for comfort and to stop you from gagging or coughing.  A mouth guard may be placed in your mouth to protect your teeth and to keep you from biting on the endoscope.  You will be asked to lie on your left side.  The endoscope is inserted down your throat and into the esophagus, stomach, and duodenum.  Air is put through the endoscope to allow your caregiver to view the lining of your esophagus clearly.  The esophagus, stomach, and duodenum is then examined. During the exam, your caregiver may:  Remove tissue to be examined under a microscope (biopsy) for inflammation, infection, or other medical problems.  Remove growths.  Remove objects (foreign bodies) that are stuck.  Treat any bleeding with medicines or other devices that stop tissues from bleeding (hot cauters, clipping devices).  Widen (dilate) or stretch narrowed areas of the esophagus and stomach.  The endoscope will then be withdrawn. AFTER THE PROCEDURE  You will be taken to a recovery area to be monitored. You will be able to go home once you are stable and alert.  Do not eat or drink anything until the local anesthetic and numbing medicines have worn off. You may choke.  It is normal to feel bloated, have pain with swallowing, or have a sore throat for a short time. This will wear off.  Your caregiver should be able to discuss his or her findings with you. It will take longer to discuss the test results if any biopsies were taken.  Document Released: 03/24/2005 Document Revised: 11/07/2012 Document Reviewed: 10/24/2012 [facility] Patient Information 2014 [place], [place].

## 2014-03-04 ENCOUNTER — Encounter (HOSPITAL_COMMUNITY): Payer: Self-pay | Admitting: Internal Medicine

## 2014-03-05 ENCOUNTER — Encounter (INDEPENDENT_AMBULATORY_CARE_PROVIDER_SITE_OTHER): Payer: Self-pay | Admitting: *Deleted

## 2014-04-01 DIAGNOSIS — R002 Palpitations: Secondary | ICD-10-CM | POA: Insufficient documentation

## 2014-04-01 DIAGNOSIS — G43909 Migraine, unspecified, not intractable, without status migrainosus: Secondary | ICD-10-CM | POA: Insufficient documentation

## 2014-04-01 DIAGNOSIS — M5136 Other intervertebral disc degeneration, lumbar region: Secondary | ICD-10-CM | POA: Insufficient documentation

## 2014-04-09 ENCOUNTER — Telehealth (HOSPITAL_COMMUNITY): Payer: Self-pay

## 2014-04-21 ENCOUNTER — Ambulatory Visit (INDEPENDENT_AMBULATORY_CARE_PROVIDER_SITE_OTHER): Payer: Medicaid Other | Admitting: Internal Medicine

## 2014-04-30 ENCOUNTER — Ambulatory Visit (INDEPENDENT_AMBULATORY_CARE_PROVIDER_SITE_OTHER): Payer: Medicaid Other | Admitting: Internal Medicine

## 2014-05-27 ENCOUNTER — Ambulatory Visit (HOSPITAL_COMMUNITY): Payer: Self-pay | Admitting: Psychology

## 2015-03-17 ENCOUNTER — Telehealth (HOSPITAL_COMMUNITY): Payer: Self-pay | Admitting: *Deleted

## 2015-03-17 NOTE — Telephone Encounter (Signed)
pt called at 12:08pm 03-17-15 and left message for office to call her back due to wanting to sch an appt. called pt on number provided on voicemail and lmtcb. number provided

## 2015-03-24 ENCOUNTER — Ambulatory Visit (INDEPENDENT_AMBULATORY_CARE_PROVIDER_SITE_OTHER): Payer: Self-pay | Admitting: Internal Medicine

## 2015-05-06 ENCOUNTER — Encounter (INDEPENDENT_AMBULATORY_CARE_PROVIDER_SITE_OTHER): Payer: Self-pay | Admitting: *Deleted

## 2015-05-12 ENCOUNTER — Ambulatory Visit: Payer: Self-pay | Admitting: Adult Health

## 2015-05-13 ENCOUNTER — Ambulatory Visit (INDEPENDENT_AMBULATORY_CARE_PROVIDER_SITE_OTHER): Payer: Medicaid Other | Admitting: Adult Health

## 2015-05-14 ENCOUNTER — Ambulatory Visit: Payer: Medicaid Other | Admitting: Adult Health

## 2015-05-14 ENCOUNTER — Encounter: Payer: Self-pay | Admitting: Adult Health

## 2015-05-20 ENCOUNTER — Ambulatory Visit: Payer: Medicaid Other | Admitting: Adult Health

## 2015-05-28 ENCOUNTER — Encounter: Payer: Self-pay | Admitting: Adult Health

## 2015-05-28 ENCOUNTER — Ambulatory Visit (INDEPENDENT_AMBULATORY_CARE_PROVIDER_SITE_OTHER): Payer: Medicaid Other | Admitting: Adult Health

## 2015-05-28 VITALS — BP 120/70 | HR 80 | Temp 98.3°F | Ht 62.0 in | Wt 118.5 lb

## 2015-05-28 DIAGNOSIS — B9689 Other specified bacterial agents as the cause of diseases classified elsewhere: Secondary | ICD-10-CM

## 2015-05-28 DIAGNOSIS — R110 Nausea: Secondary | ICD-10-CM | POA: Diagnosis not present

## 2015-05-28 DIAGNOSIS — N760 Acute vaginitis: Secondary | ICD-10-CM | POA: Diagnosis not present

## 2015-05-28 DIAGNOSIS — A499 Bacterial infection, unspecified: Secondary | ICD-10-CM | POA: Diagnosis not present

## 2015-05-28 DIAGNOSIS — N898 Other specified noninflammatory disorders of vagina: Secondary | ICD-10-CM | POA: Insufficient documentation

## 2015-05-28 DIAGNOSIS — Z3202 Encounter for pregnancy test, result negative: Secondary | ICD-10-CM | POA: Diagnosis not present

## 2015-05-28 DIAGNOSIS — R1031 Right lower quadrant pain: Secondary | ICD-10-CM | POA: Diagnosis not present

## 2015-05-28 DIAGNOSIS — R197 Diarrhea, unspecified: Secondary | ICD-10-CM | POA: Diagnosis not present

## 2015-05-28 HISTORY — DX: Other specified bacterial agents as the cause of diseases classified elsewhere: B96.89

## 2015-05-28 HISTORY — DX: Nausea: R11.0

## 2015-05-28 HISTORY — DX: Other specified noninflammatory disorders of vagina: N89.8

## 2015-05-28 HISTORY — DX: Right lower quadrant pain: R10.31

## 2015-05-28 HISTORY — DX: Diarrhea, unspecified: R19.7

## 2015-05-28 LAB — POCT URINALYSIS DIPSTICK
Blood, UA: NEGATIVE
Glucose, UA: NEGATIVE
LEUKOCYTES UA: NEGATIVE
NITRITE UA: NEGATIVE
Protein, UA: NEGATIVE

## 2015-05-28 LAB — POCT WET PREP (WET MOUNT)
CLUE CELLS WET PREP WHIFF POC: POSITIVE
WBC, Wet Prep HPF POC: POSITIVE

## 2015-05-28 LAB — POCT URINE PREGNANCY: PREG TEST UR: NEGATIVE

## 2015-05-28 MED ORDER — ONDANSETRON HCL 8 MG PO TABS: 8.0000 mg | ORAL_TABLET | Freq: Three times a day (TID) | ORAL | Status: DC | PRN

## 2015-05-28 MED ORDER — CLINDAMYCIN HCL 300 MG PO CAPS: 300.0000 mg | ORAL_CAPSULE | Freq: Four times a day (QID) | ORAL | Status: DC

## 2015-05-28 MED ORDER — AZITHROMYCIN 500 MG PO TABS: ORAL_TABLET | ORAL | Status: DC

## 2015-05-28 MED ORDER — HYDROCODONE-ACETAMINOPHEN 5-325 MG PO TABS: 1.0000 | ORAL_TABLET | Freq: Four times a day (QID) | ORAL | Status: DC | PRN

## 2015-05-28 MED ORDER — CIPROFLOXACIN HCL 500 MG PO TABS: 500.0000 mg | ORAL_TABLET | Freq: Two times a day (BID) | ORAL | Status: DC

## 2015-05-28 NOTE — Progress Notes (Signed)
Subjective:     Patient ID: Julie [name], female   DOB: 07/05/1971, 44 y.o.   MRN: [id number]  HPI Julie [name] is a 44 year old white female in complaining of RLQ pain, on and off for a week, worse in last 3 days.Has nausea and diarrhea, did have vomiting but it stopped, no known fever, had sex a month ago for first time in a while and it was painful,she has had her tubes tied.Has had some swelling in feet and hands, none today.  Review of Systems  Patient denies any headaches, hearing loss, fatigue, blurred vision, shortness of breath, chest pain. No joint pain or mood swings.See HPI for positives.  Reviewed past medical,surgical, social and family history. Reviewed medications and allergies.     Objective:   Physical Exam BP 120/70 mmHg  Pulse 80  Temp(Src) 98.3 F (36.8 C)  Ht 5' 2" (1.575 m)  Wt 118 lb 8 oz (53.751 kg)  BMI 21.67 kg/m2  LMP 04/24/2015 (Approximate)UPT negative, urine dipstick negative, Skin warm and dry.Pelvic: external genitalia is normal in appearance no lesions, vagina: white discharge with odor,urethra has no lesions or masses noted, cervix:smooth and bulbous, uterus: normal size, shape and contour, and tender, no masses felt, adnexa: no masses, RLQ tenderness noted.No rebound tenderness noted. Bladder is mildly tender and no masses felt.Abdomen soft, non tender, no HSM noted.+BS, Wet prep: + for clue cells and +WBCs. GC/CHL obtained. Discussed will get labs and US and give antibiotics, could be PID, appendix or viral infection, pt aware if pain increases or fever go to ER, discussed with Dr Eure.Face time about 30  Minutes.    Assessment:     RLQ pain Nausea Vaginal discharge BV  Diarrhea     Plan:    Rx zofran 8 mg #20 take 1 every 8 hours prn nausea, take before meds Rx norco 5-325 mg #30 take 1 every 6 hours prn pain no refills Rx cipro 500 mg #20 take 1 bid x 10 days Rx clindamycin 300 mg 1 qid x 10 days #40 Rx azithromycin 500 mg #2 take 2 po now   Check CBC,CMP, ESR and GC/CHL, will talk in am when labs back Return in 4 days for gyn US and see me If pain increases or has fever go to ER Review handout on pelvic pain      

## 2015-05-28 NOTE — Patient Instructions (Signed)
Go to ER if pain increases or has fever Return in 4 days for [place] and see me Will talk in am Pelvic Pain Female pelvic pain can be caused by many different things and start from a variety of places. Pelvic pain refers to pain that is located in the lower half of the abdomen and between your hips. The pain may occur over a short period of time (acute) or may be reoccurring (chronic). The cause of pelvic pain may be related to disorders affecting the female reproductive organs (gynecologic), but it may also be related to the bladder, kidney stones, an intestinal complication, or muscle or skeletal problems. Getting help right away for pelvic pain is important, especially if there has been severe, sharp, or a sudden onset of unusual pain. It is also important to get help right away because some types of pelvic pain can be life threatening.  CAUSES  Below are only some of the causes of pelvic pain. The causes of pelvic pain can be in one of several categories.   Gynecologic.  Pelvic inflammatory disease.  Sexually transmitted infection.  Ovarian cyst or a twisted ovarian ligament (ovarian torsion).  Uterine lining that grows outside the uterus (endometriosis).  Fibroids, cysts, or tumors.  Ovulation.  Pregnancy.  Pregnancy that occurs outside the uterus (ectopic pregnancy).  Miscarriage.  Labor.  Abruption of the placenta or ruptured uterus.  Infection.  Uterine infection (endometritis).  Bladder infection.  Diverticulitis.  Miscarriage related to a uterine infection (septic abortion).  Bladder.  Inflammation of the bladder (cystitis).  Kidney stone(s).  Gastrointestinal.  Constipation.  Diverticulitis.  Neurologic.  Trauma.  Feeling pelvic pain because of mental or emotional causes (psychosomatic).  Cancers of the bowel or pelvis. EVALUATION  Your caregiver will want to take a careful history of your concerns. This includes recent changes in your health, a  careful gynecologic history of your periods (menses), and a sexual history. Obtaining your family history and medical history is also important. Your caregiver may suggest a pelvic exam. A pelvic exam will help identify the location and severity of the pain. It also helps in the evaluation of which organ system may be involved. In order to identify the cause of the pelvic pain and be properly treated, your caregiver may order tests. These tests may include:   A pregnancy test.  Pelvic ultrasonography.  An X-ray exam of the abdomen.  A urinalysis or evaluation of vaginal discharge.  Blood tests. HOME CARE INSTRUCTIONS   Only take over-the-counter or prescription medicines for pain, discomfort, or fever as directed by your caregiver.   Rest as directed by your caregiver.   Eat a balanced diet.   Drink enough fluids to make your urine clear or pale yellow, or as directed.   Avoid sexual intercourse if it causes pain.   Apply warm or cold compresses to the lower abdomen depending on which one helps the pain.   Avoid stressful situations.   Keep a journal of your pelvic pain. Write down when it started, where the pain is located, and if there are things that seem to be associated with the pain, such as food or your menstrual cycle.  Follow up with your caregiver as directed.  SEEK MEDICAL CARE IF:  Your medicine does not help your pain.  You have abnormal vaginal discharge. SEEK IMMEDIATE MEDICAL CARE IF:   You have heavy bleeding from the vagina.   Your pelvic pain increases.   You feel light-headed or faint.  You have chills.   You have pain with urination or blood in your urine.   You have uncontrolled diarrhea or vomiting.   You have a fever or persistent symptoms for more than 3 days.  You have a fever and your symptoms suddenly get worse.   You are being physically or sexually abused.  MAKE SURE YOU:  Understand these instructions.  Will  watch your condition.  Will get help if you are not doing well or get worse. Document Released: 10/18/2004 Document Revised: 04/07/2014 Document Reviewed: 03/12/2012 [facility] Patient Information 2015 [place], [place]. This information is not intended to replace advice given to you by your health care provider. Make sure you discuss any questions you have with your health care provider.

## 2015-05-29 ENCOUNTER — Telehealth: Payer: Self-pay | Admitting: Adult Health

## 2015-05-29 LAB — GC/CHLAMYDIA PROBE AMP
CHLAMYDIA, DNA PROBE: NEGATIVE
Neisseria gonorrhoeae by PCR: NEGATIVE

## 2015-05-29 NOTE — Telephone Encounter (Signed)
Pt aware GC/CHL negative, did not get labs done yesterday, will try to go today, taking antibiotics, told her its important to get labs

## 2015-05-30 LAB — SEDIMENTATION RATE: SED RATE: 10 mm/h (ref 0–32)

## 2015-05-30 LAB — COMPREHENSIVE METABOLIC PANEL
ALBUMIN: 3.9 g/dL (ref 3.5–5.5)
ALK PHOS: 72 IU/L (ref 39–117)
ALT: 17 IU/L (ref 0–32)
AST: 19 IU/L (ref 0–40)
Albumin/Globulin Ratio: 1.5 (ref 1.1–2.5)
BUN/Creatinine Ratio: 8 — ABNORMAL LOW (ref 9–23)
BUN: 5 mg/dL — ABNORMAL LOW (ref 6–24)
Bilirubin Total: 0.2 mg/dL (ref 0.0–1.2)
CO2: 24 mmol/L (ref 18–29)
CREATININE: 0.65 mg/dL (ref 0.57–1.00)
Calcium: 9 mg/dL (ref 8.7–10.2)
Chloride: 100 mmol/L (ref 97–108)
GFR calc non Af Amer: 107 mL/min/{1.73_m2} (ref >59)
GFR, EST AFRICAN AMERICAN: 123 mL/min/{1.73_m2} (ref >59)
GLUCOSE: 60 mg/dL — AB (ref 65–99)
Globulin, Total: 2.6 g/dL (ref 1.5–4.5)
Potassium: 3.9 mmol/L (ref 3.5–5.2)
SODIUM: 139 mmol/L (ref 134–144)
Total Protein: 6.5 g/dL (ref 6.0–8.5)

## 2015-05-30 LAB — CBC
HEMOGLOBIN: 11.5 g/dL (ref 11.1–15.9)
Hematocrit: 34.3 % (ref 34.0–46.6)
MCH: 31.9 pg (ref 26.6–33.0)
MCHC: 33.5 g/dL (ref 31.5–35.7)
MCV: 95 fL (ref 79–97)
Platelets: 368 10*3/uL (ref 150–379)
RBC: 3.6 x10E6/uL — ABNORMAL LOW (ref 3.77–5.28)
RDW: 12.8 % (ref 12.3–15.4)
WBC: 11.8 10*3/uL — ABNORMAL HIGH (ref 3.4–10.8)

## 2015-06-01 ENCOUNTER — Ambulatory Visit (INDEPENDENT_AMBULATORY_CARE_PROVIDER_SITE_OTHER): Payer: Medicaid Other | Admitting: Adult Health

## 2015-06-01 ENCOUNTER — Encounter: Payer: Self-pay | Admitting: Adult Health

## 2015-06-01 ENCOUNTER — Ambulatory Visit (INDEPENDENT_AMBULATORY_CARE_PROVIDER_SITE_OTHER): Payer: Medicaid Other

## 2015-06-01 VITALS — BP 90/60 | HR 76 | Ht 62.0 in | Wt 119.0 lb

## 2015-06-01 DIAGNOSIS — R1031 Right lower quadrant pain: Secondary | ICD-10-CM | POA: Diagnosis not present

## 2015-06-01 DIAGNOSIS — R197 Diarrhea, unspecified: Secondary | ICD-10-CM | POA: Diagnosis not present

## 2015-06-01 DIAGNOSIS — R110 Nausea: Secondary | ICD-10-CM

## 2015-06-01 MED ORDER — HYDROCODONE-ACETAMINOPHEN 10-325 MG PO TABS: 1.0000 | ORAL_TABLET | Freq: Four times a day (QID) | ORAL | Status: DC | PRN

## 2015-06-01 NOTE — Progress Notes (Signed)
Subjective:     Patient ID: Julie [name], female   DOB: 12-20-1970, 44 y.o.   MRN: [id number]  HPI Julie [name] is a 44 year old white female back in follow up of RLQ pain,nausea and diarrhea, was seen 6/23, and placed on antibiotics.She says has to take 2 pain pills to help at all, if takes meds nausea better.Had low grad temp the weekend.  Review of Systems Patient denies any headaches, hearing loss, fatigue, blurred vision, shortness of breath, chest pain,  problems with  urination, or intercourse. No joint pain or mood swings.See HPI for positives.  Reviewed past medical,surgical, social and family history. Reviewed medications and allergies.     Objective:   Physical Exam BP 90/60 mmHg  Pulse 76  Ht '5\' 2"'  (1.575 m)  Wt 119 lb (53.978 kg)  BMI 21.76 kg/m2  LMP 04/24/2015 (Approximate)Skin warm and dry, abdomen soft, has good bowel sounds in all 4 quadrants, still tender RLQ when palpated, ?rebound, she said US probe hurt, US showed:Uterus 8.7 x 6.4 x 4.6 cm, wnl  Endometrium 6.5 mm, symmetrical, wnl  Right ovary 2.31 x 1.43 x 3.24 cm, wnl  Left ovary 3.0 x 2.43 x 1.4 cm, wnl    Technician Comments:  US PELVIS TA/TV: normal anteverted uterus,EEC 6.5MM,normal ov's bilat,ov's appear to be mobile,bilat pelvic pain during ultrasound,small amount of cul de sac fluid   Since [place] normal and still has pain and nausea and diarrhea will get CT of abdomen and pelvis with contrast.Labs showed ESR 10, WBC 11.8 and GC/CHL negative.     Assessment:     RLQ pain Nausea Diarrhea     Plan:     Keep taking antibiotics Rx norco 10-325 mg #30 take 1 every 6 hours prn pain no refills Will get CT abd/pelvis with contrast 6/28 at 3 pm at [place] to precert, will talk when results back and determine F/U then.

## 2015-06-01 NOTE — Progress Notes (Addendum)
US PELVIS TA/TV: normal anteverted uterus,EEC 6.5MM,normal ov's bilat,ov's appear to be mobile,bilat pelvic pain during ultrasound,small amount of cul de sac fluid

## 2015-06-01 NOTE — Patient Instructions (Signed)
Get Ct 6/28 at 3 pm at [facility] Will talk when CT results back

## 2015-06-02 ENCOUNTER — Encounter (HOSPITAL_COMMUNITY): Payer: Self-pay

## 2015-06-02 ENCOUNTER — Ambulatory Visit (HOSPITAL_COMMUNITY)
Admission: RE | Admit: 2015-06-02 | Discharge: 2015-06-02 | Disposition: A | Discharge status: Home / Self Care | Payer: Medicaid Other | Source: Ambulatory Visit | Attending: Adult Health | Admitting: Adult Health

## 2015-06-02 ENCOUNTER — Telehealth: Payer: Self-pay | Admitting: Adult Health

## 2015-06-02 DIAGNOSIS — R110 Nausea: Secondary | ICD-10-CM

## 2015-06-02 DIAGNOSIS — R112 Nausea with vomiting, unspecified: Secondary | ICD-10-CM | POA: Diagnosis not present

## 2015-06-02 DIAGNOSIS — R1031 Right lower quadrant pain: Secondary | ICD-10-CM

## 2015-06-02 DIAGNOSIS — R197 Diarrhea, unspecified: Secondary | ICD-10-CM

## 2015-06-02 MED ORDER — IOHEXOL 300 MG/ML  SOLN
100.0000 mL | Freq: Once | INTRAMUSCULAR | Status: AC | PRN
  Administered 2015-06-02: 100 mL via INTRAVENOUS

## 2015-06-02 NOTE — Telephone Encounter (Signed)
LEft message CT normal

## 2015-06-04 ENCOUNTER — Encounter (HOSPITAL_COMMUNITY): Payer: Self-pay | Admitting: Emergency Medicine

## 2015-06-04 ENCOUNTER — Emergency Department (HOSPITAL_COMMUNITY): Payer: Medicaid Other

## 2015-06-04 ENCOUNTER — Emergency Department (HOSPITAL_COMMUNITY)
Admission: EM | Admit: 2015-06-04 | Discharge: 2015-06-04 | Disposition: A | Discharge status: Home / Self Care | Payer: Medicaid Other | Attending: Emergency Medicine | Admitting: Emergency Medicine

## 2015-06-04 DIAGNOSIS — Z79899 Other long term (current) drug therapy: Secondary | ICD-10-CM | POA: Diagnosis not present

## 2015-06-04 DIAGNOSIS — M1990 Unspecified osteoarthritis, unspecified site: Secondary | ICD-10-CM | POA: Diagnosis not present

## 2015-06-04 DIAGNOSIS — K219 Gastro-esophageal reflux disease without esophagitis: Secondary | ICD-10-CM | POA: Diagnosis not present

## 2015-06-04 DIAGNOSIS — Z792 Long term (current) use of antibiotics: Secondary | ICD-10-CM | POA: Diagnosis not present

## 2015-06-04 DIAGNOSIS — R2240 Localized swelling, mass and lump, unspecified lower limb: Secondary | ICD-10-CM | POA: Insufficient documentation

## 2015-06-04 DIAGNOSIS — F329 Major depressive disorder, single episode, unspecified: Secondary | ICD-10-CM | POA: Insufficient documentation

## 2015-06-04 DIAGNOSIS — Z720 Tobacco use: Secondary | ICD-10-CM | POA: Insufficient documentation

## 2015-06-04 DIAGNOSIS — R21 Rash and other nonspecific skin eruption: Secondary | ICD-10-CM | POA: Diagnosis not present

## 2015-06-04 DIAGNOSIS — Z3202 Encounter for pregnancy test, result negative: Secondary | ICD-10-CM | POA: Insufficient documentation

## 2015-06-04 DIAGNOSIS — F419 Anxiety disorder, unspecified: Secondary | ICD-10-CM | POA: Insufficient documentation

## 2015-06-04 DIAGNOSIS — Z880 Allergy status to penicillin: Secondary | ICD-10-CM | POA: Insufficient documentation

## 2015-06-04 DIAGNOSIS — Z8742 Personal history of other diseases of the female genital tract: Secondary | ICD-10-CM | POA: Diagnosis not present

## 2015-06-04 DIAGNOSIS — R0789 Other chest pain: Secondary | ICD-10-CM | POA: Insufficient documentation

## 2015-06-04 DIAGNOSIS — Z8679 Personal history of other diseases of the circulatory system: Secondary | ICD-10-CM | POA: Insufficient documentation

## 2015-06-04 DIAGNOSIS — M791 Myalgia, unspecified site: Secondary | ICD-10-CM

## 2015-06-04 DIAGNOSIS — R079 Chest pain, unspecified: Secondary | ICD-10-CM | POA: Diagnosis present

## 2015-06-04 LAB — COMPREHENSIVE METABOLIC PANEL
ALK PHOS: 58 U/L (ref 38–126)
ALT: 14 U/L (ref 14–54)
ANION GAP: 6 (ref 5–15)
AST: 18 U/L (ref 15–41)
Albumin: 3.8 g/dL (ref 3.5–5.0)
BILIRUBIN TOTAL: 0.6 mg/dL (ref 0.3–1.2)
BUN: 6 mg/dL (ref 6–20)
CO2: 29 mmol/L (ref 22–32)
Calcium: 8.4 mg/dL — ABNORMAL LOW (ref 8.9–10.3)
Chloride: 101 mmol/L (ref 101–111)
Creatinine, Ser: 0.7 mg/dL (ref 0.44–1.00)
GFR calc Af Amer: >60 mL/min (ref >60)
GFR calc non Af Amer: >60 mL/min (ref >60)
Glucose, Bld: 91 mg/dL (ref 65–99)
Potassium: 4.4 mmol/L (ref 3.5–5.1)
Sodium: 136 mmol/L (ref 135–145)
Total Protein: 6.6 g/dL (ref 6.5–8.1)

## 2015-06-04 LAB — CBC WITH DIFFERENTIAL/PLATELET
Basophils Absolute: 0 10*3/uL (ref 0.0–0.1)
Basophils Relative: 0 % (ref 0–1)
EOS PCT: 4 % (ref 0–5)
Eosinophils Absolute: 0.5 10*3/uL (ref 0.0–0.7)
HEMATOCRIT: 34.8 % — AB (ref 36.0–46.0)
Hemoglobin: 11.3 g/dL — ABNORMAL LOW (ref 12.0–15.0)
LYMPHS PCT: 24 % (ref 12–46)
Lymphs Abs: 2.8 10*3/uL (ref 0.7–4.0)
MCH: 32.7 pg (ref 26.0–34.0)
MCHC: 32.5 g/dL (ref 30.0–36.0)
MCV: 100.6 fL — ABNORMAL HIGH (ref 78.0–100.0)
MONOS PCT: 7 % (ref 3–12)
Monocytes Absolute: 0.8 10*3/uL (ref 0.1–1.0)
NEUTROS PCT: 65 % (ref 43–77)
Neutro Abs: 7.4 10*3/uL (ref 1.7–7.7)
PLATELETS: 304 10*3/uL (ref 150–400)
RBC: 3.46 MIL/uL — ABNORMAL LOW (ref 3.87–5.11)
RDW: 12.1 % (ref 11.5–15.5)
WBC: 11.4 10*3/uL — ABNORMAL HIGH (ref 4.0–10.5)

## 2015-06-04 LAB — URINALYSIS, ROUTINE W REFLEX MICROSCOPIC
Bilirubin Urine: NEGATIVE
Glucose, UA: NEGATIVE mg/dL
Ketones, ur: NEGATIVE mg/dL
Nitrite: NEGATIVE
Protein, ur: NEGATIVE mg/dL
Specific Gravity, Urine: <1.005 — ABNORMAL LOW (ref 1.005–1.030)
Urobilinogen, UA: 0.2 mg/dL (ref 0.0–1.0)
pH: 6 (ref 5.0–8.0)

## 2015-06-04 LAB — TROPONIN I

## 2015-06-04 LAB — URINE MICROSCOPIC-ADD ON

## 2015-06-04 LAB — RAPID URINE DRUG SCREEN, HOSP PERFORMED
Amphetamines: NOT DETECTED
BENZODIAZEPINES: NOT DETECTED
Barbiturates: NOT DETECTED
COCAINE: NOT DETECTED
Opiates: POSITIVE — AB
Tetrahydrocannabinol: POSITIVE — AB

## 2015-06-04 LAB — PREGNANCY, URINE: Preg Test, Ur: NEGATIVE

## 2015-06-04 LAB — POC OCCULT BLOOD, ED: Fecal Occult Bld: NEGATIVE

## 2015-06-04 LAB — D-DIMER, QUANTITATIVE: D-Dimer, Quant: 0.35 ug/mL-FEU (ref 0.00–0.48)

## 2015-06-04 LAB — BRAIN NATRIURETIC PEPTIDE: B Natriuretic Peptide: 53 pg/mL (ref 0.0–100.0)

## 2015-06-04 MED ORDER — KETOROLAC TROMETHAMINE 60 MG/2ML IM SOLN
60.0000 mg | Freq: Once | INTRAMUSCULAR | Status: AC
  Administered 2015-06-04: 60 mg via INTRAMUSCULAR
  Filled 2015-06-04: qty 2

## 2015-06-04 MED ORDER — KETOROLAC TROMETHAMINE 30 MG/ML IJ SOLN
30.0000 mg | Freq: Once | INTRAMUSCULAR | Status: DC
  Filled 2015-06-04: qty 1

## 2015-06-04 MED ORDER — ONDANSETRON HCL 4 MG/2ML IJ SOLN
INTRAMUSCULAR | Status: AC
  Filled 2015-06-04: qty 2

## 2015-06-04 MED ORDER — ONDANSETRON HCL 4 MG/2ML IJ SOLN: 4.0000 mg | Freq: Once | INTRAMUSCULAR | Status: DC

## 2015-06-04 MED ORDER — ONDANSETRON HCL 4 MG/2ML IJ SOLN
4.0000 mg | Freq: Once | INTRAMUSCULAR | Status: AC
  Administered 2015-06-04: 4 mg via INTRAVENOUS

## 2015-06-04 MED ORDER — SODIUM CHLORIDE 0.9 % IV BOLUS (SEPSIS)
1000.0000 mL | Freq: Once | INTRAVENOUS | Status: AC
  Administered 2015-06-04: 1000 mL via INTRAVENOUS

## 2015-06-04 NOTE — ED Notes (Signed)
Swelling to hands, feet and legs for last 7 days.  Seen at OB/GYN on Monday.  Had CT scan of abdomen on Monday.  Was given 3 antibiotics and pt did not take medication and did not call to inform Anderson [place] at OB/GYN.  Was being treat for vaginal infection and upper GI infection. When breathing in and out, "chest hurt.  Rates pain 8/10.  Pt is smoker.  Cough last week greenish/gold secretion.

## 2015-06-04 NOTE — ED Notes (Signed)
Pt made aware to return if symptoms worsen or if any life threatening symptoms occur.   

## 2015-06-04 NOTE — Discharge Instructions (Signed)
Chest Pain (Nonspecific) °There is no evidence of heart attack or blood clot in the lung. Follow up with your doctor. Return to the ED if you develop new or worsening symptoms. °It is often hard to give a specific diagnosis for the cause of chest pain. There is always a chance that your pain could be related to something serious, such as a heart attack or a blood clot in the lungs. You need to follow up with your health care provider for further evaluation. °CAUSES  °· Heartburn. °· Pneumonia or bronchitis. °· Anxiety or stress. °· Inflammation around your heart (pericarditis) or lung (pleuritis or pleurisy). °· A blood clot in the lung. °· A collapsed lung (pneumothorax). It can develop suddenly on its own (spontaneous pneumothorax) or from trauma to the chest. °· Shingles infection (herpes zoster virus). °The chest wall is composed of bones, muscles, and cartilage. Any of these can be the source of the pain. °· The bones can be bruised by injury. °· The muscles or cartilage can be strained by coughing or overwork. °· The cartilage can be affected by inflammation and become sore (costochondritis). °DIAGNOSIS  °Lab tests or other studies may be needed to find the cause of your pain. Your health care provider may have you take a test called an ambulatory electrocardiogram (ECG). An ECG records your heartbeat patterns over a 24-hour period. You may also have other tests, such as: °· Transthoracic echocardiogram (TTE). During echocardiography, sound waves are used to evaluate how blood flows through your heart. °· Transesophageal echocardiogram (TEE). °· Cardiac monitoring. This allows your health care provider to monitor your heart rate and rhythm in real time. °· Holter monitor. This is a portable device that records your heartbeat and can help diagnose heart arrhythmias. It allows your health care provider to track your heart activity for several days, if needed. °· Stress tests by exercise or by giving medicine  that makes the heart beat faster. °TREATMENT  °· Treatment depends on what may be causing your chest pain. Treatment may include: °¨ Acid blockers for heartburn. °¨ Anti-inflammatory medicine. °¨ Pain medicine for inflammatory conditions. °¨ Antibiotics if an infection is present. °· You may be advised to change lifestyle habits. This includes stopping smoking and avoiding alcohol, caffeine, and chocolate. °· You may be advised to keep your head raised (elevated) when sleeping. This reduces the chance of acid going backward from your stomach into your esophagus. °Most of the time, nonspecific chest pain will improve within 2-3 days with rest and mild pain medicine.  °HOME CARE INSTRUCTIONS  °· If antibiotics were prescribed, take them as directed. Finish them even if you start to feel better. °· For the next few days, avoid physical activities that bring on chest pain. Continue physical activities as directed. °· Do not use any tobacco products, including cigarettes, chewing tobacco, or electronic cigarettes. °· Avoid drinking alcohol. °· Only take medicine as directed by your health care provider. °· Follow your health care provider's suggestions for further testing if your chest pain does not go away. °· Keep any follow-up appointments you made. If you do not go to an appointment, you could develop lasting (chronic) problems with pain. If there is any problem keeping an appointment, call to reschedule. °SEEK MEDICAL CARE IF:  °· Your chest pain does not go away, even after treatment. °· You have a rash with blisters on your chest. °· You have a fever. °SEEK IMMEDIATE MEDICAL CARE IF:  °· You have increased   chest pain or pain that spreads to your arm, neck, jaw, back, or abdomen. °· You have shortness of breath. °· You have an increasing cough, or you cough up blood. °· You have severe back or abdominal pain. °· You feel nauseous or vomit. °· You have severe weakness. °· You faint. °· You have chills. °This is an  emergency. Do not wait to see if the pain will go away. Get medical help at once. Call your local emergency services (911 in [place] Do not drive yourself to the hospital. °MAKE SURE YOU:  °· Understand these instructions. °· Will watch your condition. °· Will get help right away if you are not doing well or get worse. °Document Released: 08/31/2005 Document Revised: 11/26/2013 Document Reviewed: 06/26/2008 °[employer]® Patient Information ©2015 [employer]. This information is not intended to replace advice given to you by your health care provider. Make sure you discuss any questions you have with your health care provider. ° °

## 2015-06-04 NOTE — ED Notes (Signed)
Pt states that she usually has low bp.

## 2015-06-04 NOTE — ED Provider Notes (Signed)
CSN: [id number]     Arrival date & time 06/04/15  1217 History   First MD Initiated Contact with Patient 06/04/15 1259     Chief Complaint  Patient presents with  . Rash  . Leg Swelling  . Chest Pain     (Consider location/radiation/quality/duration/timing/severity/associated sxs/prior Treatment) HPI Comments: Patient with multiple complaints. States she has swelling to her hands, feet and legs for the past several days. She also reports a "hives" and rash which is not visible. Today she developed some central chest pain that comes and goes lasting for a few seconds at a time associated with shortness of breath. This pain does not radiate. She endorses a cough of green sputum. No fever. She is a smoker. She has chest pain with inspiration. She is not on any birth control. She denies any history of heart or lung problems. She denies any pain with urination. She's had nausea without vomiting. No change in bowel habits. No headache. She recently saw her gynecologist who performed a CT as well as ultrasound of her stomach for abdominal pain which were both unremarkable. She was treated with clindamycin for apparent bacterial vaginosis. She denies any syncope. She denies any vomiting or fever. She denies any urinary symptoms. Denies any new medications or new exposures.  Patient is a 44 y.o. female presenting with chest pain. The history is provided by the patient.  Chest Pain Associated symptoms: dizziness   Associated symptoms: no abdominal pain, no back pain, no cough, no fever, no headache, no nausea, no shortness of breath, not vomiting and no weakness     Past Medical History  Diagnosis Date  . GERD (gastroesophageal reflux disease)   . Anxiety   . Back pain   . Arthritis   . Chronic diarrhea   . Irritable bowel syndrome   . History of nuclear stress test 07/15/2008    lexiscan; mild-mod perfusion defect in mid anteroseptal, apical anterior, apical septal regions (attenuation artifiact),  prominent gut uptake activity in infero-apical region; post-stress EF 64%; EKG negative for ischemia; patient experienced CP during test  . Aortic insufficiency 2009    Noted at heart cath  . Depression   . Anger   . RLQ abdominal pain 05/28/2015  . Nausea 05/28/2015  . Vaginal discharge 05/28/2015  . BV (bacterial vaginosis) 05/28/2015  . Diarrhea 05/28/2015   Past Surgical History  Procedure Laterality Date  . Cesarean section    . Breast enhancement surgery    . Colonoscopy  05/27/2011    snare polypectomy   . Tubal ligation    . Cardiac catheterization  1025//2006    no significant CAD, mild-mod depressed LV systolic function, EF 51-88%, mild-mod AI (Dr. Gerrie Nordmann)   . Transthoracic echocardiogram  03/1659    LV systolic function is low-normal; RV is normal in size & function; mild MR, mild TR  . Esophagogastroduodenoscopy N/A 02/25/2014    Procedure: ESOPHAGOGASTRODUODENOSCOPY (EGD);  Surgeon: Rogene Houston, MD;  Location: [facility];  Service: Endoscopy;  Laterality: N/A;  730  . Esophageal biopsy N/A 02/25/2014    Procedure: BIOPSY;  Surgeon: Rogene Houston, MD;  Location: [facility];  Service: Endoscopy;  Laterality: N/A;  . Knee surgery Right    Family History  Problem Relation Age of Onset  . Other Mother     heart problems  . Other Brother     neck and back surgery  . Other Maternal Grandmother     brain tumor  .  Leukemia Maternal Grandfather    History  Substance Use Topics  . Smoking status: Current Every Day Smoker -- 0.50 packs/day for 28 years    Types: Cigarettes  . Smokeless tobacco: Never Used     Comment: smoking since age 62 - down to 3-4 cigarettes daily (12/26/13)  . Alcohol Use: No     Comment: occasionally - beer; not now   OB History    Gravida Para Term Preterm AB TAB SAB Ectopic Multiple Living   6 4   2  2   4      Review of Systems  Constitutional: Negative for fever, activity change and appetite change.  HENT: Negative for  congestion and rhinorrhea.   Eyes: Negative for visual disturbance.  Respiratory: Negative for cough and shortness of breath.   Cardiovascular: Positive for chest pain and leg swelling.  Gastrointestinal: Negative for nausea, vomiting and abdominal pain.  Genitourinary: Negative for dysuria, hematuria, vaginal bleeding and vaginal discharge.  Musculoskeletal: Negative for myalgias, back pain, arthralgias and neck pain.  Skin: Positive for rash.  Neurological: Positive for dizziness and light-headedness. Negative for weakness and headaches.   A complete 10 system review of systems was obtained and all systems are negative except as noted in the HPI and PMH.     Allergies  Flagyl and Penicillins  Home Medications   Prior to Admission medications   Medication Sig Start Date End Date Taking? Authorizing Provider  albuterol (PROVENTIL HFA;VENTOLIN HFA) 108 (90 BASE) MCG/ACT inhaler Inhale 1-2 puffs into the lungs every 6 (six) hours as needed for wheezing. 08/19/13  Yes Milton Ferguson, MD  ALPRAZolam Duanne Moron) 1 MG tablet Take 1 mg by mouth 3 (three) times daily as needed.    Yes Historical Provider, MD  cetirizine (ZYRTEC) 10 MG tablet Take 10 mg by mouth 2 (two) times daily as needed for allergies.  02/19/15  Yes Historical Provider, MD  HYDROcodone-acetaminophen (NORCO) 10-325 MG per tablet Take 1 tablet by mouth every 6 (six) hours as needed. Patient taking differently: Take 1 tablet by mouth every 6 (six) hours as needed for moderate pain.  06/01/15  Yes Estill Dooms, NP  ondansetron (ZOFRAN) 8 MG tablet Take 1 tablet (8 mg total) by mouth every 8 (eight) hours as needed for nausea or vomiting. 05/28/15  Yes Estill Dooms, NP  oxyCODONE-acetaminophen (PERCOCET) 10-325 MG per tablet Take 1 tablet by mouth every 6 (six) hours as needed for pain.   Yes Historical Provider, MD  pantoprazole (PROTONIX) 40 MG tablet Take 40 mg by mouth daily.   Yes Historical Provider, MD  Vilazodone HCl  (VIIBRYD) 20 MG TABS TAKE 1 TABLET ONCE DAILY. 04/20/15  Yes Historical Provider, MD  ciprofloxacin (CIPRO) 500 MG tablet Take 1 tablet (500 mg total) by mouth 2 (two) times daily. 05/28/15   Estill Dooms, NP  clindamycin (CLEOCIN) 300 MG capsule Take 1 capsule (300 mg total) by mouth 4 (four) times daily. 05/28/15   Estill Dooms, NP   BP 104/67 mmHg  Pulse 78  Temp(Src) 98.1 F (36.7 C) (Oral)  Resp 12  Ht 5\' 2"  (1.575 m)  Wt 116 lb (52.617 kg)  BMI 21.21 kg/m2  SpO2 100%  LMP 04/24/2015 (Approximate) Physical Exam  Constitutional: She is oriented to person, place, and time. She appears well-developed and well-nourished. No distress.  HENT:  Head: Normocephalic and atraumatic.  Mouth/Throat: Oropharynx is clear and moist. No oropharyngeal exudate.  Eyes: Conjunctivae and EOM are normal. Pupils  are equal, round, and reactive to light. Right eye exhibits no discharge. Left eye exhibits no discharge.  Neck: Normal range of motion. Neck supple.  No meningismus.  Cardiovascular: Normal rate, regular rhythm, normal heart sounds and intact distal pulses.   No murmur heard. Pulmonary/Chest: Effort normal and breath sounds normal. No respiratory distress. She exhibits no tenderness.  Abdominal: Soft. There is tenderness. There is no rebound and no guarding.  Diffuse tenderness, no guarding or rebound  Musculoskeletal: Normal range of motion. She exhibits no edema or tenderness.  No appreciable rash. No appreciable lower extremity swelling. Intact distal pulses  Neurological: She is alert and oriented to person, place, and time. No cranial nerve deficit. She exhibits normal muscle tone. Coordination normal.  No ataxia on finger to nose bilaterally. No pronator drift. 5/5 strength throughout. CN 2-12 intact. Negative Romberg. Equal grip strength. Sensation intact. Gait is normal.   Skin: Skin is warm.  Psychiatric: She has a normal mood and affect. Her behavior is normal.  Nursing  note and vitals reviewed.   ED Course  Procedures (including critical care time) Labs Review Labs Reviewed  URINALYSIS, ROUTINE W REFLEX MICROSCOPIC (NOT AT [facility]) - Abnormal; Notable for the following:    Specific Gravity, Urine <1.005 (*)    Hgb urine dipstick TRACE (*)    Leukocytes, UA TRACE (*)    All other components within normal limits  CBC WITH DIFFERENTIAL/PLATELET - Abnormal; Notable for the following:    WBC 11.4 (*)    RBC 3.46 (*)    Hemoglobin 11.3 (*)    HCT 34.8 (*)    MCV 100.6 (*)    All other components within normal limits  COMPREHENSIVE METABOLIC PANEL - Abnormal; Notable for the following:    Calcium 8.4 (*)    All other components within normal limits  URINE RAPID DRUG SCREEN, HOSP PERFORMED - Abnormal; Notable for the following:    Opiates POSITIVE (*)    Tetrahydrocannabinol POSITIVE (*)    All other components within normal limits  URINE MICROSCOPIC-ADD ON - Abnormal; Notable for the following:    Squamous Epithelial / LPF MANY (*)    All other components within normal limits  PREGNANCY, URINE  TROPONIN I  BRAIN NATRIURETIC PEPTIDE  D-DIMER, QUANTITATIVE (NOT AT [facility])  TROPONIN I  POC OCCULT BLOOD, ED    Imaging Review Dg Chest 2 View  06/04/2015   CLINICAL DATA:  Swelling to hands, feet and legs for last 7 days, productive cough, sob, pain with deep inspiration, left sided chest pain, headache, chills. History of GERD, IBS, Cardiac cath, aortic insufficiency, cardiac cath, smoker.  EXAM: CHEST  2 VIEW  COMPARISON:  02/06/2014  FINDINGS: The heart size and mediastinal contours are within normal limits. Both lungs are clear. No pleural effusion or pneumothorax. The bony thorax is intact.  IMPRESSION: No active cardiopulmonary disease.   Electronically Signed   By: Lajean Manes M.D.   On: 06/04/2015 14:28   Ct Head Wo Contrast  06/04/2015   CLINICAL DATA:  44 year old female with headache for weeks  EXAM: CT HEAD WITHOUT CONTRAST  TECHNIQUE:  Contiguous axial images were obtained from the base of the skull through the vertex without intravenous contrast.  COMPARISON:  Head CT dated 04/10/2015  FINDINGS: The ventricles and the sulci are appropriate in size for the patient's age. There is no intracranial hemorrhage. No midline shift or mass effect identified. The gray-white matter differentiation is preserved.  The visualized paranasal sinuses and  mastoid air cells are well aerated. The calvarium is intact.  IMPRESSION: No acute intracranial pathology.   Electronically Signed   By: Anner Crete M.D.   On: 06/04/2015 15:40     EKG Interpretation   Date/Time:  Thursday June 04 2015 12:50:20 EDT Ventricular Rate:  61 PR Interval:  150 QRS Duration: 79 QT Interval:  437 QTC Calculation: 440 R Axis:   88 Text Interpretation:  Sinus rhythm No significant change was found  Confirmed by Wyvonnia Dusky  MD, Avonell Lenig [phone]) on 06/04/2015 12:57:24 PM      MDM   Final diagnoses:  Myalgia  Atypical chest pain   patient with multiple complaints including rash, swelling to her hands and feet. Ongoing abdominal pain for the past several weeks. Recent negative CT and ultrasound.  There is no visible rash or swelling on exam. Patient also complains of chest pain has been intermittent since this morning. Had a reassuring stress test last year. Pain lasts for a few seconds at a time it is atypical for ACS. EKG is unchanged. Chest x-ray is negative. D-dimer is negative.  Labs appear to be at baseline. Urinalysis positive for opiates and THC. Hemoglobin slightly decreased. Hemoccult-negative. Blood pressure has improved to 114/77 after IV fluids. She is tolerating by mouth and ambulatory.  Low suspicion for acute process. Her labs appear to be at baseline. Normal kidney and liver function.  Troponin negative 2. No evidence of any liver or kidney function compromise. No appreciable rash or swelling on exam. Patient appears stable for outpatient  follow-up with her PCP. Return precautions discussed.    Ezequiel Essex, MD 06/04/15 [phone]

## 2015-08-27 ENCOUNTER — Ambulatory Visit (INDEPENDENT_AMBULATORY_CARE_PROVIDER_SITE_OTHER): Payer: Medicaid Other | Admitting: Family Medicine

## 2015-08-27 ENCOUNTER — Ambulatory Visit (INDEPENDENT_AMBULATORY_CARE_PROVIDER_SITE_OTHER): Payer: Medicaid Other

## 2015-08-27 ENCOUNTER — Encounter: Payer: Self-pay | Admitting: Family Medicine

## 2015-08-27 VITALS — BP 121/68 | HR 69 | Temp 98.1°F | Ht 62.0 in | Wt 118.0 lb

## 2015-08-27 DIAGNOSIS — M545 Low back pain, unspecified: Secondary | ICD-10-CM

## 2015-08-27 DIAGNOSIS — R55 Syncope and collapse: Secondary | ICD-10-CM | POA: Diagnosis not present

## 2015-08-27 DIAGNOSIS — F411 Generalized anxiety disorder: Secondary | ICD-10-CM | POA: Diagnosis not present

## 2015-08-27 MED ORDER — HYDROXYZINE PAMOATE 50 MG PO CAPS: 50.0000 mg | ORAL_CAPSULE | Freq: Four times a day (QID) | ORAL | Status: DC | PRN

## 2015-08-27 NOTE — Patient Instructions (Signed)
Back Pain, Adult Low back pain is very common. About 1 in 5 people have back pain.The cause of low back pain is rarely dangerous. The pain often gets better over time.About half of people with a sudden onset of back pain feel better in just 2 weeks. About 8 in 10 people feel better by 6 weeks.  CAUSES Some common causes of back pain include:  Strain of the muscles or ligaments supporting the spine.  Wear and tear (degeneration) of the spinal discs.  Arthritis.  Direct injury to the back. DIAGNOSIS Most of the time, the direct cause of low back pain is not known.However, back pain can be treated effectively even when the exact cause of the pain is unknown.Answering your caregiver's questions about your overall health and symptoms is one of the most accurate ways to make sure the cause of your pain is not dangerous. If your caregiver needs more information, he or she may order lab work or imaging tests (X-rays or MRIs).However, even if imaging tests show changes in your back, this usually does not require surgery. HOME CARE INSTRUCTIONS For many people, back pain returns.Since low back pain is rarely dangerous, it is often a condition that people can learn to manageon their own.   Remain active. It is stressful on the back to sit or stand in one place. Do not sit, drive, or stand in one place for more than 30 minutes at a time. Take short walks on level surfaces as soon as pain allows.Try to increase the length of time you walk each day.  Do not stay in bed.Resting more than 1 or 2 days can delay your recovery.  Do not avoid exercise or work.Your body is made to move.It is not dangerous to be active, even though your back may hurt.Your back will likely heal faster if you return to being active before your pain is gone.  Pay attention to your body when you bend and lift. Many people have less discomfortwhen lifting if they bend their knees, keep the load close to their bodies,and  avoid twisting. Often, the most comfortable positions are those that put less stress on your recovering back.  Find a comfortable position to sleep. Use a firm mattress and lie on your side with your knees slightly bent. If you lie on your back, put a pillow under your knees.  Only take over-the-counter or prescription medicines as directed by your caregiver. Over-the-counter medicines to reduce pain and inflammation are often the most helpful.Your caregiver may prescribe muscle relaxant drugs.These medicines help dull your pain so you can more quickly return to your normal activities and healthy exercise.  Put ice on the injured area.  Put ice in a plastic bag.  Place a towel between your skin and the bag.  Leave the ice on for 15-20 minutes, 03-04 times a day for the first 2 to 3 days. After that, ice and heat may be alternated to reduce pain and spasms.  Ask your caregiver about trying back exercises and gentle massage. This may be of some benefit.  Avoid feeling anxious or stressed.Stress increases muscle tension and can worsen back pain.It is important to recognize when you are anxious or stressed and learn ways to manage it.Exercise is a great option. SEEK MEDICAL CARE IF:  You have pain that is not relieved with rest or medicine.  You have pain that does not improve in 1 week.  You have new symptoms.  You are generally not feeling well. SEEK   IMMEDIATE MEDICAL CARE IF:   You have pain that radiates from your back into your legs.  You develop new bowel or bladder control problems.  You have unusual weakness or numbness in your arms or legs.  You develop nausea or vomiting.  You develop abdominal pain.  You feel faint. Document Released: 11/21/2005 Document Revised: 05/22/2012 Document Reviewed: 03/25/2014 [employer] Patient Information 2015 [employer]. This information is not intended to replace advice given to you by your health care provider. Make sure you  discuss any questions you have with your health care provider.  

## 2015-08-27 NOTE — Assessment & Plan Note (Signed)
She has had anxiety for some time and has weaned herself down off of Xanax and would like to try something with less side effects and less addicting. Discussed possible medications and she would like to try Atarax or hydroxyzine for now. We'll follow up in 1 month on this issue.

## 2015-08-27 NOTE — Progress Notes (Signed)
BP 121/68 mmHg  Pulse 69  Temp(Src) 98.1 F (36.7 C) (Oral)  Ht 5\' 2"  (1.575 m)  Wt 118 lb (53.524 kg)  BMI 21.58 kg/m2  LMP 08/26/2015   Subjective:    Patient ID: Julie [name], female    DOB: [birth year], 44 y.o.   MRN: [id number]  HPI: Julie [name] is a 44 y.o. female presenting on 08/27/2015 for Establish Care; Back Pain; and Ear Pain   HPI Low back pain Patient has had midline lower back pain for many years and has been on hydrocodone and other medications to help with this. Most recently she is on 10 mg of hydrocodone. She has not had any imaging or any workup for this for a while. Most recently a couple days ago she fell and feels like she injured her back and it has been worse pain than she has had previously. She denies any radiation down into her legs it is just more midline lower back pain.  Syncope Patient presents having had an episode of syncope about 2 days ago and 3 months ago. She denies any lightheadedness or dizziness previous or any prodromal symptoms. She denies any chest pain or palpitations associated with that. She does admit to having a lot of stress and pain previous and also admits to having a lot of psychological issues and feels like her anxiety may have contributed. She denies any numbness or weakness.  Anxiety Patient also complains of anxiety that she has had along with depression from a lot of her life. She has been on multiple medications before including most recently Xanax. She does not like Xanax and the way that it fills and has self weaning herself down by taking half and then quarter of pills. She does not want to take that anymore and would like to try something differently and is also started to see behavioral physician at this time.  Relevant past medical, surgical, family and social history reviewed and updated as indicated. Interim medical history since our last visit reviewed. Allergies and medications reviewed and updated.  Review of  Systems  Constitutional: Negative for fever and chills.  HENT: Negative for congestion, ear discharge and ear pain.   Eyes: Negative for redness and visual disturbance.  Respiratory: Negative for chest tightness and shortness of breath.   Cardiovascular: Negative for chest pain and leg swelling.  Genitourinary: Negative for dysuria and difficulty urinating.  Musculoskeletal: Positive for back pain. Negative for gait problem.  Skin: Negative for rash.  Neurological: Positive for dizziness and syncope. Negative for tremors, seizures, facial asymmetry, speech difficulty, weakness, light-headedness, numbness and headaches.  Psychiatric/Behavioral: Positive for dysphoric mood and agitation. Negative for suicidal ideas, behavioral problems and sleep disturbance. The patient is nervous/anxious.   All other systems reviewed and are negative.   Per HPI unless specifically indicated above     Medication List       This list is accurate as of: 08/27/15  2:31 PM.  Always use your most recent med list.               albuterol 108 (90 BASE) MCG/ACT inhaler  Commonly known as:  PROVENTIL HFA;VENTOLIN HFA  Inhale 1-2 puffs into the lungs every 6 (six) hours as needed for wheezing.     ALPRAZolam 1 MG tablet  Commonly known as:  XANAX  Take 0.5 mg by mouth daily.     cetirizine 10 MG tablet  Commonly known as:  ZYRTEC  Take 10 mg by  mouth 2 (two) times daily as needed for allergies.     citalopram 20 MG tablet  Commonly known as:  CELEXA  Take 20 mg by mouth daily.     HYDROcodone-acetaminophen 10-325 MG per tablet  Commonly known as:  NORCO  Take 1 tablet by mouth every 6 (six) hours as needed.     ondansetron 4 MG tablet  Commonly known as:  ZOFRAN  Take 4 mg by mouth every 8 (eight) hours as needed for nausea or vomiting.     pantoprazole 40 MG tablet  Commonly known as:  PROTONIX  Take 40 mg by mouth daily.     VIIBRYD 20 MG Tabs  Generic drug:  Vilazodone HCl  TAKE 1  TABLET ONCE DAILY.           Objective:    BP 121/68 mmHg  Pulse 69  Temp(Src) 98.1 F (36.7 C) (Oral)  Ht 5\' 2"  (1.575 m)  Wt 118 lb (53.524 kg)  BMI 21.58 kg/m2  LMP 08/26/2015  Wt Readings from Last 3 Encounters:  08/27/15 118 lb (53.524 kg)  06/04/15 116 lb (52.617 kg)  06/01/15 119 lb (53.978 kg)    Physical Exam  Constitutional: She is oriented to person, place, and time. She appears well-developed and well-nourished. No distress.  Eyes: Conjunctivae and EOM are normal. Pupils are equal, round, and reactive to light.  Neck: Neck supple. No thyromegaly present.  Cardiovascular: Normal rate, regular rhythm, normal heart sounds and intact distal pulses.   No murmur heard. Pulmonary/Chest: Effort normal and breath sounds normal. No respiratory distress. She has no wheezes.  Musculoskeletal: Normal range of motion. She exhibits tenderness. She exhibits no edema.       Lumbar back: She exhibits tenderness (mild tenderness to palpation, Negative straight leg raise). She exhibits normal range of motion, no swelling, no edema, no deformity and normal pulse.  Lymphadenopathy:    She has no cervical adenopathy.  Neurological: She is alert and oriented to person, place, and time. Coordination normal.  Skin: Skin is warm and dry. No rash noted. She is not diaphoretic.  Psychiatric: Her speech is normal and behavior is normal. Judgment and thought content normal. Her mood appears anxious. Her affect is not blunt, not labile and not inappropriate. She exhibits a depressed mood.  Vitals reviewed.   Results for orders placed or performed during the hospital encounter of 06/04/15  Urinalysis, Routine w reflex microscopic (not at [facility])  Result Value Ref Range   Color, Urine YELLOW YELLOW   APPearance CLEAR CLEAR   Specific Gravity, Urine <1.005 (L) 1.005 - 1.030   pH 6.0 5.0 - 8.0   Glucose, UA NEGATIVE NEGATIVE mg/dL   Hgb urine dipstick TRACE (A) NEGATIVE   Bilirubin Urine  NEGATIVE NEGATIVE   Ketones, ur NEGATIVE NEGATIVE mg/dL   Protein, ur NEGATIVE NEGATIVE mg/dL   Urobilinogen, UA 0.2 0.0 - 1.0 mg/dL   Nitrite NEGATIVE NEGATIVE   Leukocytes, UA TRACE (A) NEGATIVE  Pregnancy, urine  Result Value Ref Range   Preg Test, Ur NEGATIVE NEGATIVE  CBC with Differential/Platelet  Result Value Ref Range   WBC 11.4 (H) 4.0 - 10.5 K/uL   RBC 3.46 (L) 3.87 - 5.11 MIL/uL   Hemoglobin 11.3 (L) 12.0 - 15.0 g/dL   HCT 34.8 (L) 36.0 - 46.0 %   MCV 100.6 (H) 78.0 - 100.0 fL   MCH 32.7 26.0 - 34.0 pg   MCHC 32.5 30.0 - 36.0 g/dL   RDW 12.1  11.5 - 15.5 %   Platelets 304 150 - 400 K/uL   Neutrophils Relative % 65 43 - 77 %   Neutro Abs 7.4 1.7 - 7.7 K/uL   Lymphocytes Relative 24 12 - 46 %   Lymphs Abs 2.8 0.7 - 4.0 K/uL   Monocytes Relative 7 3 - 12 %   Monocytes Absolute 0.8 0.1 - 1.0 K/uL   Eosinophils Relative 4 0 - 5 %   Eosinophils Absolute 0.5 0.0 - 0.7 K/uL   Basophils Relative 0 0 - 1 %   Basophils Absolute 0.0 0.0 - 0.1 K/uL  Comprehensive metabolic panel  Result Value Ref Range   Sodium 136 135 - 145 mmol/L   Potassium 4.4 3.5 - 5.1 mmol/L   Chloride 101 101 - 111 mmol/L   CO2 29 22 - 32 mmol/L   Glucose, Bld 91 65 - 99 mg/dL   BUN 6 6 - 20 mg/dL   Creatinine, Ser 0.70 0.44 - 1.00 mg/dL   Calcium 8.4 (L) 8.9 - 10.3 mg/dL   Total Protein 6.6 6.5 - 8.1 g/dL   Albumin 3.8 3.5 - 5.0 g/dL   AST 18 15 - 41 U/L   ALT 14 14 - 54 U/L   Alkaline Phosphatase 58 38 - 126 U/L   Total Bilirubin 0.6 0.3 - 1.2 mg/dL   GFR calc non Af Amer >60 >60 mL/min   GFR calc Af Amer >60 >60 mL/min   Anion gap 6 5 - 15  Troponin I  Result Value Ref Range   Troponin I <0.03 <0.031 ng/mL  Brain natriuretic peptide  Result Value Ref Range   B Natriuretic Peptide 53.0 0.0 - 100.0 pg/mL  D-dimer, quantitative (not at [facility])  Result Value Ref Range   D-Dimer, Quant 0.35 0.00 - 0.48 ug/mL-FEU  Urine rapid drug screen (hosp performed)  Result Value Ref Range   Opiates  POSITIVE (A) NONE DETECTED   Cocaine NONE DETECTED NONE DETECTED   Benzodiazepines NONE DETECTED NONE DETECTED   Amphetamines NONE DETECTED NONE DETECTED   Tetrahydrocannabinol POSITIVE (A) NONE DETECTED   Barbiturates NONE DETECTED NONE DETECTED  Urine microscopic-add on  Result Value Ref Range   Squamous Epithelial / LPF MANY (A) RARE   WBC, UA 3-6 <3 WBC/hpf   RBC / HPF 0-2 <3 RBC/hpf   Bacteria, UA RARE RARE  Troponin I  Result Value Ref Range   Troponin I <0.03 <0.031 ng/mL  POC occult blood, ED  Result Value Ref Range   Fecal Occult Bld NEGATIVE NEGATIVE   Lumbar x-ray: Preliminary read by Dr. Warrick Parisian does not see any major abnormalities of the spine will await final read by radiologist.    Assessment & Plan:   Problem List Items Addressed This Visit      Other   Generalized anxiety disorder    She has had anxiety for some time and has weaned herself down off of Xanax and would like to try something with less side effects and less addicting. Discussed possible medications and she would like to try Atarax or hydroxyzine for now. We'll follow up in 1 month on this issue.      Relevant Medications   hydrOXYzine (VISTARIL) 50 MG capsule    Other Visit Diagnoses    Syncope, unspecified syncope type    -  Primary    pt had an EKG 1 month ago that was normal. likely psychogenic and stress related based on history.    Midline low back  pain without sciatica        Relevant Orders    DG Lumbar Spine 2-3 Views (Completed)    Ambulatory referral to Orthopedic Surgery        Follow up plan: Return in about 4 weeks (around 09/24/2015), or if symptoms worsen or fail to improve, for WWE and PAP.  Caryl Pina, MD [facility] 08/27/2015, 2:31 PM

## 2015-08-31 ENCOUNTER — Telehealth: Payer: Self-pay | Admitting: Family Medicine

## 2015-08-31 NOTE — Telephone Encounter (Signed)
Sounds good I hope everything goes well with the surgery, thanks for keeping [place] in touch. Caryl Pina, MD [facility] 08/31/2015, 11:58 AM

## 2015-08-31 NOTE — Telephone Encounter (Signed)
Patient aware.

## 2015-08-31 NOTE — Telephone Encounter (Signed)
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 She see's the oral surgeon tomorrow.

## 2015-09-01 ENCOUNTER — Other Ambulatory Visit: Payer: Self-pay | Admitting: Family Medicine

## 2015-09-01 NOTE — Telephone Encounter (Signed)
Spoke with pt, explained that Dr. Warrick Parisian was not here and would address in the am. Pt misses her oral surgeon appt, not ortho, due to pain

## 2015-09-02 ENCOUNTER — Telehealth: Payer: Self-pay | Admitting: Family Medicine

## 2015-09-02 NOTE — Telephone Encounter (Signed)
Per dr Warrick Parisian, patient aware he will not refill pain meds. Informed patient again

## 2015-09-02 NOTE — Telephone Encounter (Signed)
Patient was informed earlier this afternoon by Wandalee Ferdinand

## 2015-09-02 NOTE — Telephone Encounter (Signed)
Patient was informed by Wandalee Ferdinand that pain medication would not be prescribed

## 2015-09-02 NOTE — Telephone Encounter (Signed)
Discussed doing an injection for her and she refused, will not prescribe pain meds and I discussed this with her at her last visit. Caryl Pina, MD [facility] 09/02/2015, 2:54 PM

## 2015-09-02 NOTE — Telephone Encounter (Signed)
Same responses to the other we are not refilling her pain meds but are more than willing to do an oral injection if needed. Caryl Pina, MD [facility] 09/02/2015, 2:59 PM

## 2015-09-02 NOTE — Telephone Encounter (Signed)
I agree the patient needs to get into or so. We are not going to prescribe pain meds at this time, she just needs to get to the bottom of what is causing her pain. Caryl Pina, MD [facility] 09/02/2015, 3:25 PM

## 2015-09-07 ENCOUNTER — Other Ambulatory Visit: Payer: Self-pay | Admitting: Adult Health

## 2015-09-07 ENCOUNTER — Telehealth: Payer: Self-pay | Admitting: Family Medicine

## 2015-09-08 NOTE — Telephone Encounter (Signed)
Stp and advised Vistaril was sent to [facility] in [place] 9/22. Pt voiced understanding and will call the pharmacy.

## 2015-09-09 ENCOUNTER — Ambulatory Visit (INDEPENDENT_AMBULATORY_CARE_PROVIDER_SITE_OTHER): Payer: Self-pay | Admitting: Internal Medicine

## 2015-09-11 ENCOUNTER — Encounter: Payer: Self-pay | Admitting: Family Medicine

## 2015-09-11 ENCOUNTER — Ambulatory Visit (INDEPENDENT_AMBULATORY_CARE_PROVIDER_SITE_OTHER): Payer: Medicaid Other | Admitting: Family Medicine

## 2015-09-11 VITALS — BP 124/75 | HR 75 | Temp 98.4°F | Ht 62.0 in | Wt 121.6 lb

## 2015-09-11 DIAGNOSIS — I498 Other specified cardiac arrhythmias: Secondary | ICD-10-CM | POA: Diagnosis not present

## 2015-09-11 DIAGNOSIS — F411 Generalized anxiety disorder: Secondary | ICD-10-CM | POA: Diagnosis not present

## 2015-09-11 MED ORDER — BUSPIRONE HCL 15 MG PO TABS: 15.0000 mg | ORAL_TABLET | Freq: Three times a day (TID) | ORAL | Status: DC

## 2015-09-11 NOTE — Progress Notes (Signed)
BP 124/75 mmHg  Pulse 75  Temp(Src) 98.4 F (36.9 C) (Oral)  Ht 5\' 2"  (1.575 m)  Wt 121 lb 9.6 oz (55.157 kg)  BMI 22.24 kg/m2  LMP 08/26/2015   Subjective:    Patient ID: Julie [name], female    DOB: 06/17/1971, 44 y.o.   MRN: [id number]  HPI: Julie [name] is a 44 y.o. female presenting on 09/11/2015 for ER followup   HPI Chest pain and heart flutter Patient presents today because she was in the emergency department at [facility] with chest pain anxiety and rapid racing and fluttering heart rate. She denies any other symptoms currently and we do have some paperwork from them that says some type of flutter and that they gave her medication but does not state which medication and she does not know either. She never picked it up yet. She is also complaining of her anxiety today and that that was the cause of her flutter or was made worse by her flutter and says she needs her Xanax or Ativan. She sees psychiatry for management of anxiety and they did not deem it appropriate to give her benzodiazepines because of her history of drug abuse.  Relevant past medical, surgical, family and social history reviewed and updated as indicated. Interim medical history since our last visit reviewed. Allergies and medications reviewed and updated.  Review of Systems  Constitutional: Negative for fever and chills.  HENT: Negative for congestion, ear discharge and ear pain.   Eyes: Negative for redness and visual disturbance.  Respiratory: Negative for chest tightness and shortness of breath.   Cardiovascular: Positive for chest pain (Resolved today) and palpitations (resolved today). Negative for leg swelling.  Genitourinary: Negative for dysuria and difficulty urinating.  Musculoskeletal: Negative for back pain and gait problem.  Skin: Negative for rash.  Neurological: Negative for light-headedness and headaches.  Psychiatric/Behavioral: Negative for behavioral problems and agitation.  All  other systems reviewed and are negative.   Per HPI unless specifically indicated above     Medication List       This list is accurate as of: 09/11/15  5:12 PM.  Always use your most recent med list.               albuterol 108 (90 BASE) MCG/ACT inhaler  Commonly known as:  PROVENTIL HFA;VENTOLIN HFA  Inhale 1-2 puffs into the lungs every 6 (six) hours as needed for wheezing.     busPIRone 15 MG tablet  Commonly known as:  BUSPAR  Take 1 tablet (15 mg total) by mouth 3 (three) times daily.     cetirizine 10 MG tablet  Commonly known as:  ZYRTEC  Take 10 mg by mouth 2 (two) times daily as needed for allergies.     citalopram 20 MG tablet  Commonly known as:  CELEXA  Take 20 mg by mouth daily.     hydrOXYzine 50 MG capsule  Commonly known as:  VISTARIL  Take 1-2 capsules (50-100 mg total) by mouth 4 (four) times daily as needed for itching.     ondansetron 8 MG tablet  Commonly known as:  ZOFRAN  TAKE 1 TABLET BY MOUTH EVERY 8 HOURS AS NEEDED FOR NAUSEA OR VOMITING     pantoprazole 40 MG tablet  Commonly known as:  PROTONIX  Take 40 mg by mouth daily.           Objective:    BP 124/75 mmHg  Pulse 75  Temp(Src) 98.4 F (36.9  C) (Oral)  Ht 5\' 2"  (1.575 m)  Wt 121 lb 9.6 oz (55.157 kg)  BMI 22.24 kg/m2  LMP 08/26/2015  Wt Readings from Last 3 Encounters:  09/11/15 121 lb 9.6 oz (55.157 kg)  08/27/15 118 lb (53.524 kg)  06/04/15 116 lb (52.617 kg)    Physical Exam  Constitutional: She is oriented to person, place, and time. She appears well-developed and well-nourished. No distress.  Eyes: Conjunctivae and EOM are normal. Pupils are equal, round, and reactive to light.  Neck: Neck supple. No thyromegaly present.  Cardiovascular: Normal rate, regular rhythm, normal heart sounds and intact distal pulses.   No murmur heard. Pulmonary/Chest: Effort normal and breath sounds normal. No respiratory distress. She has no wheezes.  Musculoskeletal: Normal range of  motion. She exhibits no edema or tenderness.  Lymphadenopathy:    She has no cervical adenopathy.  Neurological: She is alert and oriented to person, place, and time. Coordination normal.  Skin: Skin is warm and dry. No rash noted. She is not diaphoretic.  Psychiatric: She has a normal mood and affect. Her behavior is normal.  Vitals reviewed.   Results for orders placed or performed during the hospital encounter of 06/04/15  Urinalysis, Routine w reflex microscopic (not at [facility])  Result Value Ref Range   Color, Urine YELLOW YELLOW   APPearance CLEAR CLEAR   Specific Gravity, Urine <1.005 (L) 1.005 - 1.030   pH 6.0 5.0 - 8.0   Glucose, UA NEGATIVE NEGATIVE mg/dL   Hgb urine dipstick TRACE (A) NEGATIVE   Bilirubin Urine NEGATIVE NEGATIVE   Ketones, ur NEGATIVE NEGATIVE mg/dL   Protein, ur NEGATIVE NEGATIVE mg/dL   Urobilinogen, UA 0.2 0.0 - 1.0 mg/dL   Nitrite NEGATIVE NEGATIVE   Leukocytes, UA TRACE (A) NEGATIVE  Pregnancy, urine  Result Value Ref Range   Preg Test, Ur NEGATIVE NEGATIVE  CBC with Differential/Platelet  Result Value Ref Range   WBC 11.4 (H) 4.0 - 10.5 K/uL   RBC 3.46 (L) 3.87 - 5.11 MIL/uL   Hemoglobin 11.3 (L) 12.0 - 15.0 g/dL   HCT 34.8 (L) 36.0 - 46.0 %   MCV 100.6 (H) 78.0 - 100.0 fL   MCH 32.7 26.0 - 34.0 pg   MCHC 32.5 30.0 - 36.0 g/dL   RDW 12.1 11.5 - 15.5 %   Platelets 304 150 - 400 K/uL   Neutrophils Relative % 65 43 - 77 %   Neutro Abs 7.4 1.7 - 7.7 K/uL   Lymphocytes Relative 24 12 - 46 %   Lymphs Abs 2.8 0.7 - 4.0 K/uL   Monocytes Relative 7 3 - 12 %   Monocytes Absolute 0.8 0.1 - 1.0 K/uL   Eosinophils Relative 4 0 - 5 %   Eosinophils Absolute 0.5 0.0 - 0.7 K/uL   Basophils Relative 0 0 - 1 %   Basophils Absolute 0.0 0.0 - 0.1 K/uL  Comprehensive metabolic panel  Result Value Ref Range   Sodium 136 135 - 145 mmol/L   Potassium 4.4 3.5 - 5.1 mmol/L   Chloride 101 101 - 111 mmol/L   CO2 29 22 - 32 mmol/L   Glucose, Bld 91 65 - 99 mg/dL    BUN 6 6 - 20 mg/dL   Creatinine, Ser 0.70 0.44 - 1.00 mg/dL   Calcium 8.4 (L) 8.9 - 10.3 mg/dL   Total Protein 6.6 6.5 - 8.1 g/dL   Albumin 3.8 3.5 - 5.0 g/dL   AST 18 15 - 41 U/L  ALT 14 14 - 54 U/L   Alkaline Phosphatase 58 38 - 126 U/L   Total Bilirubin 0.6 0.3 - 1.2 mg/dL   GFR calc non Af Amer >60 >60 mL/min   GFR calc Af Amer >60 >60 mL/min   Anion gap 6 5 - 15  Troponin I  Result Value Ref Range   Troponin I <0.03 <0.031 ng/mL  Brain natriuretic peptide  Result Value Ref Range   B Natriuretic Peptide 53.0 0.0 - 100.0 pg/mL  D-dimer, quantitative (not at [facility])  Result Value Ref Range   D-Dimer, Quant 0.35 0.00 - 0.48 ug/mL-FEU  Urine rapid drug screen (hosp performed)  Result Value Ref Range   Opiates POSITIVE (A) NONE DETECTED   Cocaine NONE DETECTED NONE DETECTED   Benzodiazepines NONE DETECTED NONE DETECTED   Amphetamines NONE DETECTED NONE DETECTED   Tetrahydrocannabinol POSITIVE (A) NONE DETECTED   Barbiturates NONE DETECTED NONE DETECTED  Urine microscopic-add on  Result Value Ref Range   Squamous Epithelial / LPF MANY (A) RARE   WBC, UA 3-6 <3 WBC/hpf   RBC / HPF 0-2 <3 RBC/hpf   Bacteria, UA RARE RARE  Troponin I  Result Value Ref Range   Troponin I <0.03 <0.031 ng/mL  POC occult blood, ED  Result Value Ref Range   Fecal Occult Bld NEGATIVE NEGATIVE      Assessment & Plan:   Problem List Items Addressed This Visit      Other   Generalized anxiety disorder - Primary    Patient has generalized anxiety that is being managed by psychiatry. She comes in here because she had an ER visit a couple days ago where she went in because of anxiety panic attacks and chest pains. She was ruled out of chest pain but her heart rate was really high and they diagnosed her with some kind of periodic flutter by from the notes that we got from them it's difficult to tell what type exactly. She denies any fluttering today but does say it happens occasionally. She said they  sent her medication but she does not know what it is because she has not picked it up from the pharmacy and it is not in their notes that we got faxed over. We are petitioning for more thorough notes from the ER to see if we can better ascertain what they diagnosed her with.       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        Follow up plan: Return in about 4 weeks (around 10/09/2015), or if symptoms worsen or fail to improve.  Caryl Pina, MD [facility] 09/11/2015, 5:12 PM

## 2015-09-14 NOTE — Assessment & Plan Note (Signed)
Patient has generalized anxiety that is being managed by psychiatry. She comes in here because she had an ER visit a couple days ago where she went in because of anxiety panic attacks and chest pains. She was ruled out of chest pain but her heart rate was really high and they diagnosed her with some kind of periodic flutter by from the notes that we got from them it's difficult to tell what type exactly. She denies any fluttering today but does say it happens occasionally. She said they sent her medication but she does not know what it is because she has not picked it up from the pharmacy and it is not in their notes that we got faxed over. We are petitioning for more thorough notes from the ER to see if we can better ascertain what they diagnosed her with.

## 2015-09-15 ENCOUNTER — Telehealth: Payer: Self-pay | Admitting: Family Medicine

## 2015-09-16 ENCOUNTER — Telehealth: Payer: Self-pay | Admitting: Internal Medicine

## 2015-09-16 NOTE — Telephone Encounter (Signed)
Spoke with patient. She went to [facility] a few days ago for fast heart rate. She states it had been going on a long time before she decided to go to ED, but her heart was beating "fast and hard" and was causing her "bed to shake". She began to have chest pain in the center of her chest and felt a like she had to take deep breaths. She said the ED did EKGs and CXR and told her she had atrial tachycardia and advised she see her cardiologist. She said yesterday her left arm and hand were numb from the elbow down and she wasn't feeling well.   Patient scheduled to see R. Barrett, PA on 10/18 @ 130pm.

## 2015-09-16 NOTE — Telephone Encounter (Signed)
Julie [name] is calling because she went to the ED a couple days agoa with her heart rate being high and is still not feeling very well , Please call   Thanks

## 2015-09-22 ENCOUNTER — Encounter: Payer: Self-pay | Admitting: Physician Assistant

## 2015-09-22 ENCOUNTER — Encounter (INDEPENDENT_AMBULATORY_CARE_PROVIDER_SITE_OTHER): Payer: Self-pay | Admitting: *Deleted

## 2015-09-22 NOTE — Progress Notes (Signed)
    Cardiology Office Note   Date:  09/22/2015   ID:  [name], DOB [birth year], MRN [id number]  PCP:  Worthy Rancher, MD  Cardiologist:  Dr Caldwell Nation, Suanne Marker, PA-C   No chief complaint on file.   History of Present Illness: Julie [name] is a 44 y.o. 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 Echo after that w/ low-nl EF, no AI. ?coronary spasm or Takotsubo CM. MV 02/2014 w/ technical issues, but felt OK. Hx anxiety, depression, tob use.

## 2015-09-24 ENCOUNTER — Ambulatory Visit: Payer: Medicaid Other | Admitting: Family Medicine

## 2015-09-29 ENCOUNTER — Encounter (INDEPENDENT_AMBULATORY_CARE_PROVIDER_SITE_OTHER): Payer: Self-pay | Admitting: Internal Medicine

## 2015-09-29 ENCOUNTER — Ambulatory Visit (INDEPENDENT_AMBULATORY_CARE_PROVIDER_SITE_OTHER): Payer: Medicaid Other | Admitting: Internal Medicine

## 2015-09-29 VITALS — BP 104/62 | HR 72 | Temp 98.1°F | Ht 62.0 in | Wt 121.4 lb

## 2015-09-29 DIAGNOSIS — K589 Irritable bowel syndrome without diarrhea: Secondary | ICD-10-CM | POA: Diagnosis not present

## 2015-09-29 MED ORDER — HYOSCYAMINE SULFATE 0.125 MG SL SUBL: 0.1250 mg | SUBLINGUAL_TABLET | SUBLINGUAL | Status: DC | PRN

## 2015-09-29 NOTE — Progress Notes (Signed)
Subjective:    Patient ID: Julie [name], female    DOB: 21-Jul-1971, 44 y.o.   MRN: [id number]  HPI Here today for f/u. Hx of chronic diarrhea and felt to have IBS.  She was last seen in February of 2015. She presents today with c/o epigastric pain and she says she has had some swelling. She she she has some constipation but she does have normal stools.  She usually has a BM every 2-3 days. No melena or BRRB. She is alternating between constipation/loose stool and normal stool. She sometimes has to take Tums at night for GERD. Takes Protonix in the AM.      02/25/2014 EGD  Indications: Patient is a 44 year old Caucasian female who has chronic diarrhea felt to be secondary to IBS unresponsible therapy. She was screened for celiac disease. Tissue trueness glutaminase antibody IgA and gliadin antibody IgG are both elevated. She is therefore undergoing EGD with duodenal biopsy. She also has chronic GERD and maintained on PPI. mpression: Small sliding-type hernia without evidence of esophagitis. Small amount of food debris in the stomach with patent pylorus and no evidence of peptic ulcer disease. No endoscopic changes of celiac disease noted involving bulbar and post bulbar mucosa. Biopsy taken.      05/27/2011 Colonoscopy with snare polypectomy.  INDICATION: Inell is a 44 year old Caucasian female who presents with diarrhea, abdominal cramps, and rectal bleeding for over 2 months. She has been seen in emergency room on 2 occasions and her last hemoglobin in April 2012, was 11.8. when I saw patient in our office on May 10, 2011, I gave her prescription for dicyclomine and she did not try this medicine. She states she just forgot about it. FINAL DIAGNOSES: 1. Normal terminal ileum. No evidence of endoscopic colitis. 2. A 6-7 mm polyp snared from transverse  colon. 3. External hemorrhoids. 4. Suspect she has irritable bowel syndrome and rectal bleeding  secondary to hemorrhoids.  Biopsy: Tubular adenoma. Review of Systems     Past Medical History  Diagnosis Date  . GERD (gastroesophageal reflux disease)   . Anxiety   . Back pain   . Arthritis   . Chronic diarrhea   . Irritable bowel syndrome   . History of nuclear stress test 07/15/2008    lexiscan; mild-mod perfusion defect in mid anteroseptal, apical anterior, apical septal regions (attenuation artifiact), prominent gut uptake activity in infero-apical region; post-stress EF 64%; EKG negative for ischemia; patient experienced CP during test  . Aortic insufficiency 2009    Noted at heart cath  . Depression   . Anger   . RLQ abdominal pain 05/28/2015  . Nausea 05/28/2015  . Vaginal discharge 05/28/2015  . BV (bacterial vaginosis) 05/28/2015  . Diarrhea 05/28/2015    Past Surgical History  Procedure Laterality Date  . Cesarean section    . Breast enhancement surgery    . Colonoscopy  05/27/2011    snare polypectomy   . Tubal ligation    . Cardiac catheterization  1025//2006    no significant CAD, mild-mod depressed LV systolic function, EF 56-31%, mild-mod AI (Dr. Gerrie Nordmann)   . Transthoracic echocardiogram  03/9701    LV systolic function is low-normal; RV is normal in size & function; mild MR, mild TR  . Esophagogastroduodenoscopy N/A 02/25/2014    Procedure: ESOPHAGOGASTRODUODENOSCOPY (EGD);  Surgeon: Rogene Houston, MD;  [facility];  Service: Endoscopy;  Laterality: N/A;  730  . Esophageal biopsy N/A 02/25/2014    Procedure: BIOPSY;  Surgeon: Rogene Houston, MD;  Location: [facility];  Service: Endoscopy;  Laterality: N/A;  . Knee surgery Right     Allergies  Allergen Reactions  . Flagyl [Metronidazole Hcl] Nausea And Vomiting  . Penicillins Other (See Comments)    Caused patient to pass out.     Current Outpatient Prescriptions on File Prior to  Visit  Medication Sig Dispense Refill  . albuterol (PROVENTIL HFA;VENTOLIN HFA) 108 (90 BASE) MCG/ACT inhaler Inhale 1-2 puffs into the lungs every 6 (six) hours as needed for wheezing. 1 Inhaler 0  . busPIRone (BUSPAR) 15 MG tablet Take 1 tablet (15 mg total) by mouth 3 (three) times daily. 90 tablet 3  . cetirizine (ZYRTEC) 10 MG tablet Take 10 mg by mouth 2 (two) times daily as needed for allergies.     . citalopram (CELEXA) 20 MG tablet Take 20 mg by mouth daily.    . hydrOXYzine (VISTARIL) 50 MG capsule Take 1-2 capsules (50-100 mg total) by mouth 4 (four) times daily as needed for itching. 120 capsule 1  . ondansetron (ZOFRAN) 8 MG tablet TAKE 1 TABLET BY MOUTH EVERY 8 HOURS AS NEEDED FOR NAUSEA OR VOMITING 20 tablet 0  . pantoprazole (PROTONIX) 40 MG tablet Take 40 mg by mouth daily.     No current facility-administered medications on file prior to visit.     Objective:   Physical Exam Blood pressure 104/62, pulse 72, temperature 98.1 F (36.7 C), height 5\' 2"  (1.575 m), weight 121 lb 6.4 oz (55.067 kg). Alert and oriented. Skin warm and dry. Oral mucosa is moist.   . Sclera anicteric, conjunctivae is pink. Thyroid not enlarged. No cervical lymphadenopathy. Lungs clear. Heart regular rate and rhythm.  Abdomen is soft. Bowel sounds are positive. No hepatomegaly. No abdominal masses felt. No tenderness.  No edema to lower extremities.          Assessment & Plan:  IBS. Rx for Levsin every 4 hrs as needed. OV in 1 year.

## 2015-09-29 NOTE — Patient Instructions (Signed)
Rx for Levsin every 4 hrs as needed. OV 1 year.

## 2015-10-01 ENCOUNTER — Ambulatory Visit: Payer: Medicaid Other | Admitting: Family Medicine

## 2015-10-02 ENCOUNTER — Encounter: Payer: Self-pay | Admitting: Family Medicine

## 2015-10-12 ENCOUNTER — Encounter: Payer: Self-pay | Admitting: Adult Health

## 2015-10-12 ENCOUNTER — Ambulatory Visit (INDEPENDENT_AMBULATORY_CARE_PROVIDER_SITE_OTHER): Payer: Medicaid Other | Admitting: Adult Health

## 2015-10-12 ENCOUNTER — Encounter: Payer: Self-pay | Admitting: Physician Assistant

## 2015-10-12 VITALS — BP 124/84 | HR 77 | Ht 62.0 in | Wt 121.0 lb

## 2015-10-12 DIAGNOSIS — R002 Palpitations: Secondary | ICD-10-CM | POA: Diagnosis not present

## 2015-10-12 DIAGNOSIS — R000 Tachycardia, unspecified: Secondary | ICD-10-CM | POA: Diagnosis not present

## 2015-10-12 LAB — CBC WITH DIFFERENTIAL/PLATELET
BASOS PCT: 0 % (ref 0–1)
Basophils Absolute: 0 10*3/uL (ref 0.0–0.1)
EOS ABS: 0.4 10*3/uL (ref 0.0–0.7)
Eosinophils Relative: 3 % (ref 0–5)
HCT: 36.4 % (ref 36.0–46.0)
Hemoglobin: 12.4 g/dL (ref 12.0–15.0)
Lymphocytes Relative: 29 % (ref 12–46)
Lymphs Abs: 3.7 10*3/uL (ref 0.7–4.0)
MCH: 31.5 pg (ref 26.0–34.0)
MCHC: 34.1 g/dL (ref 30.0–36.0)
MCV: 92.4 fL (ref 78.0–100.0)
MONO ABS: 1 10*3/uL (ref 0.1–1.0)
MONOS PCT: 8 % (ref 3–12)
MPV: 9 fL (ref 8.6–12.4)
Neutro Abs: 7.7 10*3/uL (ref 1.7–7.7)
Neutrophils Relative %: 60 % (ref 43–77)
PLATELETS: 411 10*3/uL — AB (ref 150–400)
RBC: 3.94 MIL/uL (ref 3.87–5.11)
RDW: 13.4 % (ref 11.5–15.5)
WBC: 12.9 10*3/uL — ABNORMAL HIGH (ref 4.0–10.5)

## 2015-10-12 MED ORDER — METOPROLOL TARTRATE 25 MG PO TABS: 12.5000 mg | ORAL_TABLET | Freq: Two times a day (BID) | ORAL | Status: DC

## 2015-10-12 NOTE — Patient Instructions (Addendum)
Medication Instructions:  Your physician has recommended you make the following change in your medication: Lopressor 12.5 mg Two Times Daily    Labwork: Your physician recommends that you return for lab work in: (TSH, CBC, BMET)    Testing/Procedures: Your physician has requested that you have an echocardiogram. Echocardiography is a painless test that uses sound waves to create images of your heart. It provides your doctor with information about the size and shape of your heart and how well your heart's chambers and valves are working. This procedure takes approximately one hour. There are no restrictions for this procedure.  Your physician has recommended that you wear an event monitor. Event monitors are medical devices that record the heart's electrical activity. Doctors most often [place] these monitors to diagnose arrhythmias. Arrhythmias are problems with the speed or rhythm of the heartbeat. The monitor is a small, portable device. You can wear one while you do your normal daily activities. This is usually used to diagnose what is causing palpitations/syncope (passing out).    Follow-Up:1 Month in [place]  Any Other Special Instructions Will Be Listed Below (If Applicable).     If you need a refill on your cardiac medications before your next appointment, please call your pharmacy.  Thank you for choosing [facility]!

## 2015-10-12 NOTE — Progress Notes (Signed)
    Cardiology Office Note   Date:  10/12/2015   ID:  AMRY CATHY, DOB Nov 21, 1971, MRN [id number]  PCP:  Worthy Rancher, MD  Cardiologist:  Dr [place] Nation, Suanne Marker, PA-C   No chief complaint on file.   History of Present Illness: Julie [name] is a 44 y.o. 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 Echo after that w/ low-nl EF, no AI. ?coronary spasm or Takotsubo CM. MV 02/2014 w/ technical issues, but felt OK. Hx anxiety, depression, tob use.

## 2015-10-12 NOTE — Progress Notes (Signed)
Cardiology Office Note   Date:  10/12/2015   ID:  [name], DOB [birth year], MRN [id number]  PCP:  Worthy Rancher, MD  Cardiologist:  To Be Established/ Jory Sims, NP   Chief Complaint  Patient presents with  . Tachycardia      History of Present Illness: Julie [name] is a 44 y.o. female who presents as an add-on to my clinic today who was originally scheduled in [place] but did not go there. She was in [place] 2 weeks ago with complaints of rapid HR-160 bpm per patient report. She was given IV fluids and a Rx for lopressor 12.5 mg BID to take PRN for rapid HR.     She was seen in the past by Dr. Liz Beach in 2009 and had cardiac cath with normal coronary arteries, echocardiogram with mild to moderate AI, and did have a cardiac monitor at that time,but no records of its results. She also states that she has chronic back pain and bilateral arm pain and had been put on a steroid taper the day she started having the rapid HR. She took the first 6 pills and has not taken since. I do not yet have records from [place].    She states that she has rapid HR mostly in the morning but sometimes in the evening at rest. She admits to excessive caffeine with coffee and tea and also smokes. She has only taken the lopressor twice since receiving the Rx. She has rapid HR almost daily, however, but states it usually goes away on its own. If it does not,she takes a lopressor tablet.   Past Medical History  Diagnosis Date  . GERD (gastroesophageal reflux disease)   . Anxiety   . Back pain   . Arthritis   . Chronic diarrhea   . Irritable bowel syndrome   . History of nuclear stress test 07/15/2008    lexiscan; mild-mod perfusion defect in mid anteroseptal, apical anterior, apical septal regions (attenuation artifiact), prominent gut uptake activity in infero-apical region; post-stress EF 64%; EKG negative for ischemia; patient experienced CP during test  . Aortic insufficiency 2009     Noted at heart cath  . Depression   . Anger   . RLQ abdominal pain 05/28/2015  . Nausea 05/28/2015  . Vaginal discharge 05/28/2015  . BV (bacterial vaginosis) 05/28/2015  . Diarrhea 05/28/2015    Past Surgical History  Procedure Laterality Date  . Cesarean section    . Breast enhancement surgery    . Colonoscopy  05/27/2011    snare polypectomy   . Tubal ligation    . Cardiac catheterization  1025//2006    no significant CAD, mild-mod depressed LV systolic function, EF 40-98%, mild-mod AI (Dr. Gerrie Nordmann)   . Transthoracic echocardiogram  12/1912    LV systolic function is low-normal; RV is normal in size & function; mild MR, mild TR  . Esophagogastroduodenoscopy N/A 02/25/2014    Procedure: ESOPHAGOGASTRODUODENOSCOPY (EGD);  Surgeon: Rogene Houston, MD;  [facility];  Service: Endoscopy;  Laterality: N/A;  730  . Esophageal biopsy N/A 02/25/2014    Procedure: BIOPSY;  Surgeon: Rogene Houston, MD;  [facility];  Service: Endoscopy;  Laterality: N/A;  . Knee surgery Right      Current Outpatient Prescriptions  Medication Sig Dispense Refill  . albuterol (PROVENTIL HFA;VENTOLIN HFA) 108 (90 BASE) MCG/ACT inhaler Inhale 1-2 puffs into the lungs every 6 (six) hours as needed for wheezing. 1 Inhaler  0  . busPIRone (BUSPAR) 15 MG tablet Take 1 tablet (15 mg total) by mouth 3 (three) times daily. 90 tablet 3  . cetirizine (ZYRTEC) 10 MG tablet Take 10 mg by mouth 2 (two) times daily as needed for allergies.     . citalopram (CELEXA) 20 MG tablet Take 20 mg by mouth daily.    . hydrOXYzine (VISTARIL) 50 MG capsule Take 1-2 capsules (50-100 mg total) by mouth 4 (four) times daily as needed for itching. 120 capsule 1  . hyoscyamine (LEVSIN SL) 0.125 MG SL tablet Place 1 tablet (0.125 mg total) under the tongue every 4 (four) hours as needed. 30 tablet 0  . ondansetron (ZOFRAN) 8 MG tablet TAKE 1 TABLET BY MOUTH EVERY 8 HOURS AS NEEDED FOR NAUSEA OR VOMITING 20 tablet  0  . pantoprazole (PROTONIX) 40 MG tablet Take 40 mg by mouth daily.    . traMADol (ULTRAM-ER) 100 MG 24 hr tablet Take 100 mg by mouth daily.    . metoprolol tartrate (LOPRESSOR) 25 MG tablet Take 12.5 mg by mouth 2 (two) times daily. As needed for palpitations was only given 10 days worth on 09/11/15     No current facility-administered medications for this visit.    Allergies:   Flagyl and Penicillins    Social History:  The patient  reports that she has been smoking Cigarettes.  She started smoking about 29 years ago. She has a 14 pack-year smoking history. She has never used smokeless tobacco. She reports that she does not drink alcohol or use illicit drugs.   Family History:  The patient's family history includes Leukemia in her maternal grandfather; Other in her brother, maternal grandmother, and mother.    ROS: All other systems are reviewed and negative. Unless otherwise mentioned in H&P    PHYSICAL EXAM: VS:  BP 124/84 mmHg  Pulse 77  Ht 5\' 2"  (1.575 m)  Wt 121 lb (54.885 kg)  BMI 22.13 kg/m2  SpO2 98%  LMP 09/29/2015 , BMI Body mass index is 22.13 kg/(m^2). GEN: Well nourished, well developed, in no acute distress HEENT: normal Neck: no JVD, carotid bruits, or masses Cardiac: RRR; no murmurs, rubs, or gallops,no edema  Respiratory:  clear to auscultation bilaterally, normal work of breathing GI: soft, nontender, nondistended, + BS MS: no deformity or atrophy Skin: warm and dry, no rash Neuro:  Strength and sensation are intact Psych: euthymic mood, full affect  Recent Labs: 06/04/2015: ALT 14; B Natriuretic Peptide 53.0; BUN 6; Creatinine, Ser 0.70; Hemoglobin 11.3*; Platelets 304; Potassium 4.4; Sodium 136    Lipid Panel No results found for: CHOL, TRIG, HDL, CHOLHDL, VLDL, LDLCALC, LDLDIRECT    Wt Readings from Last 3 Encounters:  10/12/15 121 lb (54.885 kg)  09/29/15 121 lb 6.4 oz (55.067 kg)  09/11/15 121 lb 9.6 oz (55.157 kg)    ASSESSMENT AND  PLAN:  1. Tachycardia: She was recently seen in [place] about 2 weeks ago for rapid HR. She was given lopressor 12.5 mg BID but is only taking prn. I will restart her on lopressor 12.5 mg BID daily. She will have an echocardiogram, and have a cardiac monitor placed. She has been counseled on decreasing caffeine intake. Will request records from [place].   2. Tobacco abuse: She has no plans to quit as this helps her anxiety.   3. Chronic pain: Takes oxycodone for this along with tramadol.      Current medicines are reviewed at length with the patient today.  Labs/ tests ordered today include: BMET, CBC, TSH, 2 week cardiac monitor and echocardiogram.      Disposition:   FU with one month with cardiologist to be established in [place] or in [place].  Signed, Jory Sims, NP  10/12/2015 4:14 PM    [facility] [address] [place],  [address] Phone: [phone]; Fax: [phone]

## 2015-10-12 NOTE — Progress Notes (Deleted)
[name]    DOB: June 12, 1971  Age: 44 y.o.  MR#: [id number]       PCP:  Fransisca Kaufmann Dettinger, MD      Insurance: Payor: MEDICAID [place] / Plan: MEDICAID [place] ACCESS / Product Type: *No Product type* /   CC:   No chief complaint on file.   VS Filed Vitals:   10/12/15 1547  BP: 124/84  Pulse: 77  Height: 5\' 2"  (1.575 m)  Weight: 121 lb (54.885 kg)  SpO2: 98%    Weights Current Weight  10/12/15 121 lb (54.885 kg)  09/29/15 121 lb 6.4 oz (55.067 kg)  09/11/15 121 lb 9.6 oz (55.157 kg)    Blood Pressure  BP Readings from Last 3 Encounters:  10/12/15 124/84  09/29/15 104/62  09/11/15 124/75     Admit date:  (Not on file) Last encounter with RMR:  Visit date not found   Allergy Flagyl and Penicillins  Current Outpatient Prescriptions  Medication Sig Dispense Refill  . albuterol (PROVENTIL HFA;VENTOLIN HFA) 108 (90 BASE) MCG/ACT inhaler Inhale 1-2 puffs into the lungs every 6 (six) hours as needed for wheezing. 1 Inhaler 0  . busPIRone (BUSPAR) 15 MG tablet Take 1 tablet (15 mg total) by mouth 3 (three) times daily. 90 tablet 3  . cetirizine (ZYRTEC) 10 MG tablet Take 10 mg by mouth 2 (two) times daily as needed for allergies.     . citalopram (CELEXA) 20 MG tablet Take 20 mg by mouth daily.    . hydrOXYzine (VISTARIL) 50 MG capsule Take 1-2 capsules (50-100 mg total) by mouth 4 (four) times daily as needed for itching. 120 capsule 1  . hyoscyamine (LEVSIN SL) 0.125 MG SL tablet Place 1 tablet (0.125 mg total) under the tongue every 4 (four) hours as needed. 30 tablet 0  . ondansetron (ZOFRAN) 8 MG tablet TAKE 1 TABLET BY MOUTH EVERY 8 HOURS AS NEEDED FOR NAUSEA OR VOMITING 20 tablet 0  . pantoprazole (PROTONIX) 40 MG tablet Take 40 mg by mouth daily.    . traMADol (ULTRAM-ER) 100 MG 24 hr tablet Take 100 mg by mouth daily.     No current facility-administered medications for this visit.    Discontinued Meds:   There are no discontinued medications.  Patient  Active Problem List   Diagnosis Date Noted  . Generalized anxiety disorder 08/27/2015  . Tobacco abuse 02/07/2014  . Palpitations 12/26/2013  . Rectal bleeding 11/12/2013  . Hemorrhoids 11/12/2013  . Nausea alone 11/12/2013  . IBS (irritable bowel syndrome) 08/16/2012  . GERD (gastroesophageal reflux disease) 08/16/2012  . Arthritis 08/16/2012    LABS    Component Value Date/Time   NA 136 06/04/2015 1325   NA 139 05/29/2015 1641   NA 137 02/06/2014 1238   NA 136 10/04/2013 1139   K 4.4 06/04/2015 1325   K 3.9 05/29/2015 1641   K 3.3* 02/06/2014 1238   CL 101 06/04/2015 1325   CL 100 05/29/2015 1641   CL 99 02/06/2014 1238   CO2 29 06/04/2015 1325   CO2 24 05/29/2015 1641   CO2 24 02/06/2014 1238   GLUCOSE 91 06/04/2015 1325   GLUCOSE 60* 05/29/2015 1641   GLUCOSE 98 02/06/2014 1238   GLUCOSE 114* 10/04/2013 1139   BUN 6 06/04/2015 1325   BUN 5* 05/29/2015 1641   BUN 7 02/06/2014 1238   BUN 7 10/04/2013 1139   CREATININE 0.70 06/04/2015 1325   CREATININE 0.65 05/29/2015 1641   CREATININE 0.60  02/06/2014 1238   CALCIUM 8.4* 06/04/2015 1325   CALCIUM 9.0 05/29/2015 1641   CALCIUM 9.5 02/06/2014 1238   GFRNONAA >60 06/04/2015 1325   GFRNONAA 107 05/29/2015 1641   GFRNONAA >90 02/06/2014 1238   GFRAA >60 06/04/2015 1325   GFRAA 123 05/29/2015 1641   GFRAA >90 02/06/2014 1238   CMP     Component Value Date/Time   NA 136 06/04/2015 1325   NA 139 05/29/2015 1641   K 4.4 06/04/2015 1325   CL 101 06/04/2015 1325   CO2 29 06/04/2015 1325   GLUCOSE 91 06/04/2015 1325   GLUCOSE 60* 05/29/2015 1641   BUN 6 06/04/2015 1325   BUN 5* 05/29/2015 1641   CREATININE 0.70 06/04/2015 1325   CALCIUM 8.4* 06/04/2015 1325   PROT 6.6 06/04/2015 1325   PROT 6.5 05/29/2015 1641   ALBUMIN 3.8 06/04/2015 1325   ALBUMIN 3.9 05/29/2015 1641   AST 18 06/04/2015 1325   ALT 14 06/04/2015 1325   ALKPHOS 58 06/04/2015 1325   BILITOT 0.6 06/04/2015 1325   BILITOT 0.2 05/29/2015 1641    GFRNONAA >60 06/04/2015 1325   GFRAA >60 06/04/2015 1325       Component Value Date/Time   WBC 11.4* 06/04/2015 1325   WBC 11.8* 05/29/2015 1641   WBC 12.3* 02/06/2014 1238   WBC 10.6* 10/04/2013 1139   HGB 11.3* 06/04/2015 1325   HGB 14.0 02/06/2014 1238   HGB 12.3 10/04/2013 1139   HCT 34.8* 06/04/2015 1325   HCT 34.3 05/29/2015 1641   HCT 40.5 02/06/2014 1238   HCT 37.0 10/04/2013 1139   MCV 100.6* 06/04/2015 1325   MCV 97.4 02/06/2014 1238   MCV 99.5 10/04/2013 1139    Lipid Panel  No results found for: CHOL, TRIG, HDL, CHOLHDL, VLDL, LDLCALC, LDLDIRECT  ABG No results found for: PHART, PCO2ART, PO2ART, HCO3, TCO2, ACIDBASEDEF, O2SAT   No results found for: TSH BNP (last 3 results)  Recent Labs  06/04/15 1325  BNP 53.0    ProBNP (last 3 results) No results for input(s): PROBNP in the last 8760 hours.  Cardiac Panel (last 3 results) No results for input(s): CKTOTAL, CKMB, TROPONINI, RELINDX in the last 72 hours.  Iron/TIBC/Ferritin/ %Sat No results found for: IRON, TIBC, FERRITIN, IRONPCTSAT   EKG Orders placed or performed during the hospital encounter of 06/04/15  . EKG 12-Lead  . EKG 12-Lead  . EKG     Prior Assessment and Plan Problem List as of 10/12/2015      Cardiovascular and Mediastinum   Hemorrhoids     Digestive   IBS (irritable bowel syndrome)   GERD (gastroesophageal reflux disease)   Rectal bleeding     Musculoskeletal and Integument   Arthritis     Other   Nausea alone   Palpitations   Tobacco abuse   Generalized anxiety disorder   Last Assessment & Plan 09/11/2015 Office Visit Written 09/14/2015  1:40 PM by Worthy Rancher, MD    Patient has generalized anxiety that is being managed by psychiatry. She comes in here because she had an ER visit a couple days ago where she went in because of anxiety panic attacks and chest pains. She was ruled out of chest pain but her heart rate was really high and they diagnosed her with  some kind of periodic flutter by from the notes that we got from them it's difficult to tell what type exactly. She denies any fluttering today but does say it happens occasionally. She  said they sent her medication but she does not know what it is because she has not picked it up from the pharmacy and it is not in their notes that we got faxed over. We are petitioning for more thorough notes from the ER to see if we can better ascertain what they diagnosed her with.          Imaging: No results found.

## 2015-10-13 LAB — BASIC METABOLIC PANEL
BUN: 6 mg/dL — ABNORMAL LOW (ref 7–25)
CALCIUM: 8.9 mg/dL (ref 8.6–10.2)
CO2: 27 mmol/L (ref 20–31)
CREATININE: 0.71 mg/dL (ref 0.50–1.10)
Chloride: 99 mmol/L (ref 98–110)
Glucose, Bld: 98 mg/dL (ref 65–99)
POTASSIUM: 4.2 mmol/L (ref 3.5–5.3)
SODIUM: 135 mmol/L (ref 135–146)

## 2015-10-13 LAB — TSH: TSH: 0.845 u[IU]/mL (ref 0.350–4.500)

## 2015-10-14 ENCOUNTER — Encounter: Payer: Self-pay | Admitting: Family Medicine

## 2015-10-14 ENCOUNTER — Encounter (INDEPENDENT_AMBULATORY_CARE_PROVIDER_SITE_OTHER): Payer: Medicaid Other

## 2015-10-14 ENCOUNTER — Ambulatory Visit (INDEPENDENT_AMBULATORY_CARE_PROVIDER_SITE_OTHER): Payer: Medicaid Other | Admitting: Family Medicine

## 2015-10-14 VITALS — BP 111/76 | HR 77 | Temp 98.4°F | Ht 62.0 in | Wt 123.0 lb

## 2015-10-14 DIAGNOSIS — R002 Palpitations: Secondary | ICD-10-CM

## 2015-10-14 DIAGNOSIS — G5603 Carpal tunnel syndrome, bilateral upper limbs: Secondary | ICD-10-CM

## 2015-10-14 DIAGNOSIS — M546 Pain in thoracic spine: Secondary | ICD-10-CM | POA: Diagnosis not present

## 2015-10-14 DIAGNOSIS — R000 Tachycardia, unspecified: Secondary | ICD-10-CM

## 2015-10-14 MED ORDER — BACLOFEN 10 MG PO TABS: 10.0000 mg | ORAL_TABLET | Freq: Three times a day (TID) | ORAL | Status: DC

## 2015-10-14 NOTE — Progress Notes (Signed)
BP 111/76 mmHg  Pulse 77  Temp(Src) 98.4 F (36.9 C) (Oral)  Ht _0  (1.575 m)  Wt 123 lb (55.792 kg)  BMI 22.49 kg/m2  LMP 09/29/2015   Subjective:    Patient ID: Julie [name], female    DOB: Sep 20, 1971, 44 y.o.   MRN: [id number]  HPI: Julie [name] is a 44 y.o. female presenting on 10/14/2015 for Back Pain; Heartburn; and Hand Pain   HPI Back pain  Patient has left thoracic back pain that she describes as a tight feeling on that side of her back. This new back pain arose within the past few days. She gets recurrent back pain and tightness. She says she cannot take muscle relaxants because they make her feel sick. She denies fevers or chills, she denies any pain shooting up or down from that spot. Pain is worse with lateral rotation of her upper back.  Bilateral carpal tunnel Patient has been having bilateral carpal tunnel. She saw Dr. case at [facility] or so in [place] and heated injections on both sides she feels like she has had minimal improvement from that. He also gave her a right-sided thumb spica splint but she feels like it is too large. She would also like one for the left side. The right side is worse in that she has had some more tingling and shooting pains up into her thumb and that side of her hand. They are possibly discussing surgery in the future for her.  Relevant past medical, surgical, family and social history reviewed and updated as indicated. Interim medical history since our last visit reviewed. Allergies and medications reviewed and updated.  Review of Systems  Constitutional: Negative for fever and chills.  HENT: Negative for congestion, ear discharge and ear pain.   Eyes: Negative for redness and visual disturbance.  Respiratory: Negative for chest tightness and shortness of breath.   Cardiovascular: Negative for chest pain and leg swelling.  Genitourinary: Negative for dysuria and difficulty urinating.  Musculoskeletal: Positive for back pain and  arthralgias (wrist). Negative for gait problem.  Skin: Negative for rash.  Neurological: Negative for light-headedness, numbness (Tingling but no numbness in right hand) and headaches.  Psychiatric/Behavioral: Negative for behavioral problems and agitation.  All other systems reviewed and are negative.   Per HPI unless specifically indicated above     Medication List       This list is accurate as of: 10/14/15  4:13 PM.  Always use your most recent med list.               albuterol 108 (90 BASE) MCG/ACT inhaler  Commonly known as:  PROVENTIL HFA;VENTOLIN HFA  Inhale 1-2 puffs into the lungs every 6 (six) hours as needed for wheezing.     baclofen 10 MG tablet  Commonly known as:  LIORESAL  Take 1 tablet (10 mg total) by mouth 3 (three) times daily.     busPIRone 15 MG tablet  Commonly known as:  BUSPAR  Take 1 tablet (15 mg total) by mouth 3 (three) times daily.     cetirizine 10 MG tablet  Commonly known as:  ZYRTEC  Take 10 mg by mouth 2 (two) times daily as needed for allergies.     citalopram 20 MG tablet  Commonly known as:  CELEXA  Take 20 mg by mouth daily.     hydrOXYzine 50 MG capsule  Commonly known as:  VISTARIL  Take 1-2 capsules (50-100 mg total) by mouth 4 (  four) times daily as needed for itching.     hyoscyamine 0.125 MG SL tablet  Commonly known as:  LEVSIN SL  Place 1 tablet (0.125 mg total) under the tongue every 4 (four) hours as needed.     metoprolol tartrate 25 MG tablet  Commonly known as:  LOPRESSOR  Take 0.5 tablets (12.5 mg total) by mouth 2 (two) times daily. As needed for palpitations was only given 10 days worth on 09/11/15     ondansetron 8 MG tablet  Commonly known as:  ZOFRAN  TAKE 1 TABLET BY MOUTH EVERY 8 HOURS AS NEEDED FOR NAUSEA OR VOMITING     pantoprazole 40 MG tablet  Commonly known as:  PROTONIX  Take 40 mg by mouth daily.     traMADol 100 MG 24 hr tablet  Commonly known as:  ULTRAM-ER  Take 100 mg by mouth daily.            Objective:    BP 111/76 mmHg  Pulse 77  Temp(Src) 98.4 F (36.9 C) (Oral)  Ht _0  (1.575 m)  Wt 123 lb (55.792 kg)  BMI 22.49 kg/m2  LMP 09/29/2015  Wt Readings from Last 3 Encounters:  10/14/15 123 lb (55.792 kg)  10/12/15 121 lb (54.885 kg)  09/29/15 121 lb 6.4 oz (55.067 kg)    Physical Exam  Constitutional: She is oriented to person, place, and time. She appears well-developed and well-nourished. No distress.  Eyes: Conjunctivae and EOM are normal. Pupils are equal, round, and reactive to light.  Cardiovascular: Normal rate, regular rhythm, normal heart sounds and intact distal pulses.   No murmur heard. Pulmonary/Chest: Effort normal and breath sounds normal. No respiratory distress. She has no wheezes.  Musculoskeletal: Normal range of motion. She exhibits no edema or tenderness.       Right wrist: She exhibits normal range of motion, no tenderness (Unable to elicit Tinel's or Phalen sign today.), no bony tenderness, no swelling and no effusion.       Left wrist: She exhibits normal range of motion (Unable to elicit Tinel's or Phalen sign today.), no tenderness, no bony tenderness and no swelling.       Right hand: She exhibits normal range of motion, no tenderness and normal capillary refill. Normal sensation noted. Normal strength noted.       Left hand: Normal sensation noted. Normal strength noted.  Neurological: She is alert and oriented to person, place, and time. Coordination normal.  Skin: Skin is warm and dry. No rash noted. She is not diaphoretic.  Psychiatric: She has a normal mood and affect. Her behavior is normal.  Nursing note and vitals reviewed.   Results for orders placed or performed in visit on 76/73/41  Basic Metabolic Panel (BMET)  Result Value Ref Range   Sodium 135 135 - 146 mmol/L   Potassium 4.2 3.5 - 5.3 mmol/L   Chloride 99 98 - 110 mmol/L   CO2 27 20 - 31 mmol/L   Glucose, Bld 98 65 - 99 mg/dL   BUN 6 (L) 7 - 25 mg/dL   Creat  0.71 0.50 - 1.10 mg/dL   Calcium 8.9 8.6 - 10.2 mg/dL  TSH  Result Value Ref Range   TSH 0.845 0.350 - 4.500 uIU/mL  CBC with Differential  Result Value Ref Range   WBC 12.9 (H) 4.0 - 10.5 K/uL   RBC 3.94 3.87 - 5.11 MIL/uL   Hemoglobin 12.4 12.0 - 15.0 g/dL   HCT 36.4 36.0 -  46.0 %   MCV 92.4 78.0 - 100.0 fL   MCH 31.5 26.0 - 34.0 pg   MCHC 34.1 30.0 - 36.0 g/dL   RDW 13.4 11.5 - 15.5 %   Platelets 411 (H) 150 - 400 K/uL   MPV 9.0 8.6 - 12.4 fL   Neutrophils Relative % 60 43 - 77 %   Neutro Abs 7.7 1.7 - 7.7 K/uL   Lymphocytes Relative 29 12 - 46 %   Lymphs Abs 3.7 0.7 - 4.0 K/uL   Monocytes Relative 8 3 - 12 %   Monocytes Absolute 1.0 0.1 - 1.0 K/uL   Eosinophils Relative 3 0 - 5 %   Eosinophils Absolute 0.4 0.0 - 0.7 K/uL   Basophils Relative 0 0 - 1 %   Basophils Absolute 0.0 0.0 - 0.1 K/uL   Smear Review Criteria for review not met       Assessment & Plan:   Problem List Items Addressed This Visit    None    Visit Diagnoses    Left-sided thoracic back pain    -  Primary    Relevant Medications    baclofen (LIORESAL) 10 MG tablet    Other Relevant Orders    Ambulatory referral to Orthopedic Surgery    Ambulatory referral to Orthopedic Surgery    Bilateral carpal tunnel syndrome        Relevant Medications    baclofen (LIORESAL) 10 MG tablet    Other Relevant Orders    Ambulatory referral to Orthopedic Surgery        Follow up plan: Return if symptoms worsen or fail to improve.  Caryl Pina, MD [facility] 10/14/2015, 4:13 PM

## 2015-10-15 ENCOUNTER — Ambulatory Visit (INDEPENDENT_AMBULATORY_CARE_PROVIDER_SITE_OTHER): Payer: Medicaid Other

## 2015-10-15 ENCOUNTER — Other Ambulatory Visit: Payer: Self-pay

## 2015-10-15 DIAGNOSIS — R000 Tachycardia, unspecified: Secondary | ICD-10-CM

## 2015-10-15 DIAGNOSIS — R002 Palpitations: Secondary | ICD-10-CM

## 2015-10-20 ENCOUNTER — Encounter (INDEPENDENT_AMBULATORY_CARE_PROVIDER_SITE_OTHER): Payer: Self-pay | Admitting: *Deleted

## 2015-10-28 ENCOUNTER — Other Ambulatory Visit: Payer: Self-pay

## 2015-11-02 ENCOUNTER — Telehealth: Payer: Self-pay | Admitting: Family Medicine

## 2015-11-02 NOTE — Telephone Encounter (Signed)
Pharmacy aware that we did not prescribe must have been orthopedic.

## 2015-11-03 ENCOUNTER — Telehealth: Payer: Self-pay | Admitting: Family Medicine

## 2015-11-03 DIAGNOSIS — G5603 Carpal tunnel syndrome, bilateral upper limbs: Secondary | ICD-10-CM

## 2015-11-03 NOTE — Telephone Encounter (Signed)
Do you remember writing a script for a thumb spica splint for this patient?  There is no record of it in her chart.  Patient's pharmacy states they received the prescription for a "thumb spica splint" but they have misplaced it.  Now her orthopaedic doctor's office has given her a splint but they are unable to charge for it because the original script has been misplaced.  Can you re-write another prescription for "thumb spica splint" with the diagnosis code on the prescription so that we may fax it to [facility] at [phone] and we will place a copy of it in the patient's chart.

## 2015-11-04 NOTE — Telephone Encounter (Signed)
Okay to rewrite prescription, wasn't written prescriptions that slight doesn't show up in the system normally. We are going to give her 1 for both hands. Diagnosis: Bilateral carpal tunnel syndrome Caryl Pina, MD [facility] 11/04/2015, 8:21 AM

## 2015-11-04 NOTE — Telephone Encounter (Signed)
Orders faxed

## 2015-11-05 ENCOUNTER — Encounter: Payer: Medicaid Other | Admitting: Family Medicine

## 2015-11-11 ENCOUNTER — Encounter: Payer: Medicaid Other | Admitting: Family Medicine

## 2015-11-12 ENCOUNTER — Encounter: Payer: Self-pay | Admitting: Family Medicine

## 2015-11-13 ENCOUNTER — Encounter (HOSPITAL_COMMUNITY): Payer: Self-pay | Admitting: Emergency Medicine

## 2015-11-13 ENCOUNTER — Emergency Department (HOSPITAL_COMMUNITY)
Admission: EM | Admit: 2015-11-13 | Discharge: 2015-11-13 | Disposition: A | Discharge status: Home / Self Care | Payer: Medicaid Other | Attending: Emergency Medicine | Admitting: Emergency Medicine

## 2015-11-13 DIAGNOSIS — F419 Anxiety disorder, unspecified: Secondary | ICD-10-CM | POA: Insufficient documentation

## 2015-11-13 DIAGNOSIS — K219 Gastro-esophageal reflux disease without esophagitis: Secondary | ICD-10-CM | POA: Diagnosis not present

## 2015-11-13 DIAGNOSIS — Z79899 Other long term (current) drug therapy: Secondary | ICD-10-CM | POA: Insufficient documentation

## 2015-11-13 DIAGNOSIS — F17210 Nicotine dependence, cigarettes, uncomplicated: Secondary | ICD-10-CM | POA: Diagnosis not present

## 2015-11-13 DIAGNOSIS — Z8742 Personal history of other diseases of the female genital tract: Secondary | ICD-10-CM | POA: Insufficient documentation

## 2015-11-13 DIAGNOSIS — Z880 Allergy status to penicillin: Secondary | ICD-10-CM | POA: Insufficient documentation

## 2015-11-13 DIAGNOSIS — Z8679 Personal history of other diseases of the circulatory system: Secondary | ICD-10-CM | POA: Insufficient documentation

## 2015-11-13 DIAGNOSIS — K589 Irritable bowel syndrome without diarrhea: Secondary | ICD-10-CM | POA: Diagnosis not present

## 2015-11-13 DIAGNOSIS — M1990 Unspecified osteoarthritis, unspecified site: Secondary | ICD-10-CM | POA: Diagnosis not present

## 2015-11-13 DIAGNOSIS — F329 Major depressive disorder, single episode, unspecified: Secondary | ICD-10-CM | POA: Insufficient documentation

## 2015-11-13 DIAGNOSIS — M545 Low back pain: Secondary | ICD-10-CM | POA: Diagnosis not present

## 2015-11-13 NOTE — Discharge Instructions (Signed)

## 2015-11-13 NOTE — ED Notes (Signed)
Pt c/o of chronic lower back pain that become increasingly worse that radiates to RT hip and down to ankle. Pt reports numbness and tingling to RLE. Pt states she is supposed to have an MRI, but is having issues with insurance. Pt states she has a disc that is sitting on a nerve per her MD.

## 2015-11-13 NOTE — ED Provider Notes (Signed)
CSN: [id number]     Arrival date & time 11/13/15  1331 History   First MD Initiated Contact with Patient 11/13/15 1458     Chief Complaint  Patient presents with  . Back Pain     (Consider location/radiation/quality/duration/timing/severity/associated sxs/prior Treatment) HPI   44 old female with back pain. Onset several months ago. Pain is in the right lower back. Worsening within the past 2 weeks. Radiates down into her right lower extremity. Pain at rest and exacerbated by movements. No urinary complaints. No fevers or chills. Denies any prior back surgeries. No blood thinners. Has been taking gabapentin, tramadol and ibuprofen with no significant relief. No focal loss of strength.  Past Medical History  Diagnosis Date  . GERD (gastroesophageal reflux disease)   . Anxiety   . Back pain   . Arthritis   . Chronic diarrhea   . Irritable bowel syndrome   . History of nuclear stress test 07/15/2008    lexiscan; mild-mod perfusion defect in mid anteroseptal, apical anterior, apical septal regions (attenuation artifiact), prominent gut uptake activity in infero-apical region; post-stress EF 64%; EKG negative for ischemia; patient experienced CP during test  . Aortic insufficiency 2009    Noted at heart cath  . Depression   . Anger   . RLQ abdominal pain 05/28/2015  . Nausea 05/28/2015  . Vaginal discharge 05/28/2015  . BV (bacterial vaginosis) 05/28/2015  . Diarrhea 05/28/2015   Past Surgical History  Procedure Laterality Date  . Cesarean section    . Breast enhancement surgery    . Colonoscopy  05/27/2011    snare polypectomy   . Tubal ligation    . Cardiac catheterization  1025//2006    no significant CAD, mild-mod depressed LV systolic function, EF 123456, mild-mod AI (Dr. Gerrie Nordmann)   . Transthoracic echocardiogram  99991111    LV systolic function is low-normal; RV is normal in size & function; mild MR, mild TR  . Esophagogastroduodenoscopy N/A 02/25/2014    Procedure:  ESOPHAGOGASTRODUODENOSCOPY (EGD);  Surgeon: Rogene Houston, MD;  Location: [facility];  Service: Endoscopy;  Laterality: N/A;  730  . Esophageal biopsy N/A 02/25/2014    Procedure: BIOPSY;  Surgeon: Rogene Houston, MD;  Location: [facility];  Service: Endoscopy;  Laterality: N/A;  . Knee surgery Right    Family History  Problem Relation Age of Onset  . Other Mother     heart problems  . Other Brother     neck and back surgery  . Other Maternal Grandmother     brain tumor  . Leukemia Maternal Grandfather    Social History  Substance Use Topics  . Smoking status: Current Every Day Smoker -- 0.50 packs/day for 28 years    Types: Cigarettes    Start date: 10/11/1986  . Smokeless tobacco: Never Used     Comment: smoking since age 50 - down to 3-4 cigarettes daily (12/26/13)  . Alcohol Use: No     Comment: occasionally - beer; not now   OB History    Gravida Para Term Preterm AB TAB SAB Ectopic Multiple Living   6 4   2  2   4      Review of Systems    Allergies  Flagyl and Penicillins  Home Medications   Prior to Admission medications   Medication Sig Start Date End Date Taking? Authorizing Provider  albuterol (PROVENTIL HFA;VENTOLIN HFA) 108 (90 BASE) MCG/ACT inhaler Inhale 1-2 puffs into the lungs every 6 (six)  hours as needed for wheezing. 08/19/13   Milton Ferguson, MD  baclofen (LIORESAL) 10 MG tablet Take 1 tablet (10 mg total) by mouth 3 (three) times daily. 10/14/15   Fransisca Kaufmann Dettinger, MD  busPIRone (BUSPAR) 15 MG tablet Take 1 tablet (15 mg total) by mouth 3 (three) times daily. 09/11/15   Fransisca Kaufmann Dettinger, MD  cetirizine (ZYRTEC) 10 MG tablet Take 10 mg by mouth 2 (two) times daily as needed for allergies.  02/19/15   Historical Provider, MD  citalopram (CELEXA) 20 MG tablet Take 20 mg by mouth daily.    Historical Provider, MD  hydrOXYzine (VISTARIL) 50 MG capsule Take 1-2 capsules (50-100 mg total) by mouth 4 (four) times daily as needed for itching. 08/27/15    Fransisca Kaufmann Dettinger, MD  hyoscyamine (LEVSIN SL) 0.125 MG SL tablet Place 1 tablet (0.125 mg total) under the tongue every 4 (four) hours as needed. 09/29/15   Butch Penny, NP  metoprolol tartrate (LOPRESSOR) 25 MG tablet Take 0.5 tablets (12.5 mg total) by mouth 2 (two) times daily. As needed for palpitations was only given 10 days worth on 09/11/15 Patient taking differently: Take 12.5 mg by mouth 2 (two) times daily. One tablet twice daily 10/12/15   Lendon Colonel, NP  ondansetron (ZOFRAN) 8 MG tablet TAKE 1 TABLET BY MOUTH EVERY 8 HOURS AS NEEDED FOR NAUSEA OR VOMITING 09/07/15   Estill Dooms, NP  pantoprazole (PROTONIX) 40 MG tablet Take 40 mg by mouth daily.    Historical Provider, MD  traMADol (ULTRAM-ER) 100 MG 24 hr tablet Take 100 mg by mouth daily.    Historical Provider, MD   BP 114/77 mmHg  Pulse 97  Temp(Src) 98.2 F (36.8 C) (Oral)  Resp 16  Ht 5\' 2"  (1.575 m)  Wt 128 lb (58.06 kg)  BMI 23.41 kg/m2  SpO2 100%  LMP 11/13/2015 Physical Exam  Constitutional: She appears well-developed and well-nourished. No distress.  HENT:  Head: Normocephalic and atraumatic.  Eyes: Conjunctivae are normal. Right eye exhibits no discharge. Left eye exhibits no discharge.  Neck: Neck supple.  Cardiovascular: Normal rate, regular rhythm and normal heart sounds.  Exam reveals no gallop and no friction rub.   No murmur heard. Pulmonary/Chest: Effort normal and breath sounds normal. No respiratory distress.  Abdominal: Soft. She exhibits no distension. There is no tenderness.  Musculoskeletal: She exhibits no edema or tenderness.  Mild tenderness to palpation paraspinally on the right in the lumbar region. No concerning skin changes. No midline spinal tenderness. She is 5 out of 5 bilateral lower extremities. Sensation is intact to light touch. Palpable DP pulses. Patellar reflexes 2+.   Neurological: She is alert.  Skin: Skin is warm and dry.  Psychiatric: She has a normal mood and  affect. Her behavior is normal. Thought content normal.  Nursing note and vitals reviewed.   ED Course  Procedures (including critical care time) Labs Review Labs Reviewed - No data to display  Imaging Review No results found. I have personally reviewed and evaluated these images and lab results as part of my medical decision-making.   EKG Interpretation None      MDM   Final diagnoses:  Right low back pain, with sciatica presence unspecified    Old female with lower back pain. Appears to be chronic in nature. Discussed treatment options. Patient is not interested in any medications that are narcotics. Explained to her that chronic pain management is not ideally done through the emergency  room. She was offered multiple nonnarcotic alternatives to treat her symptoms but she declined all of these. She has a nonfocal neurological examination. She reports that she is currently trying to arrange an MRI. She has outpatient follow-up with a back surgeon.    Virgel Manifold, MD 11/13/15 [phone]

## 2015-11-23 ENCOUNTER — Ambulatory Visit (INDEPENDENT_AMBULATORY_CARE_PROVIDER_SITE_OTHER): Payer: Medicaid Other | Admitting: Cardiovascular Disease

## 2015-11-23 ENCOUNTER — Encounter: Payer: Self-pay | Admitting: Cardiovascular Disease

## 2015-11-23 VITALS — BP 112/68 | HR 95 | Ht 62.0 in | Wt 130.0 lb

## 2015-11-23 DIAGNOSIS — R002 Palpitations: Secondary | ICD-10-CM | POA: Diagnosis not present

## 2015-11-23 DIAGNOSIS — I359 Nonrheumatic aortic valve disorder, unspecified: Secondary | ICD-10-CM | POA: Diagnosis not present

## 2015-11-23 NOTE — Progress Notes (Signed)
Patient ID: Julie [name], female   DOB: 01-23-1971, 44 y.o.   MRN: [id number]      SUBJECTIVE: The patient is a 44 year old woman with a history of palpitations who I am evaluating for the first time today. She saw K. Lawrence NP on 10/12/15 ordered an echocardiogram , event monitor, and had her start taking metoprolol 12.5 mg twice daily. Prior coronary angiography demonstrated normal coronary arteries. At that time her EF was 40% with global hypokinesis and mild to moderate aortic insufficiency. Subsequent echocardiography has not demonstrated aortic insufficiency. She has a long history of palpitations and atypical chest pain symptoms. Stress testing prior to coronary angiography demonstrated breast attenuation artifact. There was some suggestion that she may have had transient coronary artery spasm or perhaps a Takotsubo cardiomyopathy.  Echocardiogram on 10/15/15 demonstrated normal left ventricular systolic and diastolic function and regional wall motion, EF 55-60%, there is mild to moderate aortic annular calcification with no evidence of stenosis.  Event monitoring demonstrated sinus rhythm with no arrhythmias with symptoms corresponded to sinus rhythm.  Primary complaints relate to back pain with associated left arm numbness and bilateral leg pain and weakness. Seeing a specialist and recently had an MRI.   Review of Systems: As per "subjective", otherwise negative.  Allergies  Allergen Reactions  . Flagyl [Metronidazole Hcl] Nausea And Vomiting  . Penicillins Other (See Comments)    Caused patient to pass out.     Current Outpatient Prescriptions  Medication Sig Dispense Refill  . albuterol (PROVENTIL HFA;VENTOLIN HFA) 108 (90 BASE) MCG/ACT inhaler Inhale 1-2 puffs into the lungs every 6 (six) hours as needed for wheezing. 1 Inhaler 0  . baclofen (LIORESAL) 10 MG tablet Take 1 tablet (10 mg total) by mouth 3 (three) times daily. 30 each 0  . busPIRone (BUSPAR) 15 MG tablet Take  1 tablet (15 mg total) by mouth 3 (three) times daily. 90 tablet 3  . citalopram (CELEXA) 20 MG tablet Take 20 mg by mouth daily.    . hydrOXYzine (VISTARIL) 50 MG capsule Take 1-2 capsules (50-100 mg total) by mouth 4 (four) times daily as needed for itching. 120 capsule 1  . hyoscyamine (LEVSIN SL) 0.125 MG SL tablet Place 1 tablet (0.125 mg total) under the tongue every 4 (four) hours as needed. 30 tablet 0  . metoprolol tartrate (LOPRESSOR) 25 MG tablet Take 0.5 tablets (12.5 mg total) by mouth 2 (two) times daily. As needed for palpitations was only given 10 days worth on 09/11/15 (Patient taking differently: Take 12.5 mg by mouth 2 (two) times daily. One tablet twice daily) 30 tablet 6  . ondansetron (ZOFRAN) 8 MG tablet TAKE 1 TABLET BY MOUTH EVERY 8 HOURS AS NEEDED FOR NAUSEA OR VOMITING 20 tablet 0  . pantoprazole (PROTONIX) 40 MG tablet Take 40 mg by mouth daily.    . traMADol (ULTRAM-ER) 100 MG 24 hr tablet Take 100 mg by mouth daily.     No current facility-administered medications for this visit.    Past Medical History  Diagnosis Date  . GERD (gastroesophageal reflux disease)   . Anxiety   . Back pain   . Arthritis   . Chronic diarrhea   . Irritable bowel syndrome   . History of nuclear stress test 07/15/2008    lexiscan; mild-mod perfusion defect in mid anteroseptal, apical anterior, apical septal regions (attenuation artifiact), prominent gut uptake activity in infero-apical region; post-stress EF 64%; EKG negative for ischemia; patient experienced CP during test  . Aortic  insufficiency 2009    Noted at heart cath  . Depression   . Anger   . RLQ abdominal pain 05/28/2015  . Nausea 05/28/2015  . Vaginal discharge 05/28/2015  . BV (bacterial vaginosis) 05/28/2015  . Diarrhea 05/28/2015    Past Surgical History  Procedure Laterality Date  . Cesarean section    . Breast enhancement surgery    . Colonoscopy  05/27/2011    snare polypectomy   . Tubal ligation    . Cardiac  catheterization  1025//2006    no significant CAD, mild-mod depressed LV systolic function, EF 123456, mild-mod AI (Dr. Gerrie Nordmann)   . Transthoracic echocardiogram  99991111    LV systolic function is low-normal; RV is normal in size & function; mild MR, mild TR  . Esophagogastroduodenoscopy N/A 02/25/2014    Procedure: ESOPHAGOGASTRODUODENOSCOPY (EGD);  Surgeon: Rogene Houston, MD;  Location: [facility];  Service: Endoscopy;  Laterality: N/A;  730  . Esophageal biopsy N/A 02/25/2014    Procedure: BIOPSY;  Surgeon: Rogene Houston, MD;  Location: [facility];  Service: Endoscopy;  Laterality: N/A;  . Knee surgery Right     Social History   Social History  . Marital Status: Single    Spouse Name: N/A  . Number of Children: 4  . Years of Education: 9   Occupational History  .     Social History Main Topics  . Smoking status: Current Every Day Smoker -- 0.50 packs/day for 28 years    Types: Cigarettes    Start date: 10/11/1986  . Smokeless tobacco: Never Used     Comment: smoking since age 78 - down to 3-4 cigarettes daily (12/26/13)  . Alcohol Use: No     Comment: occasionally - beer; not now  . Drug Use: No     Comment: occasionally; none now  . Sexual Activity: Yes    Birth Control/ Protection: Surgical     Comment: tubal   Other Topics Concern  . Not on file   Social History Narrative     Filed Vitals:   11/23/15 1510  BP: 112/68  Pulse: 95  Height: 5\' 2"  (1.575 m)  Weight: 130 lb (58.968 kg)  SpO2: 98%    PHYSICAL EXAM General: NAD HEENT: Normal. Neck: No JVD, no thyromegaly. Lungs: Clear to auscultation bilaterally with normal respiratory effort. CV: Nondisplaced PMI.  Regular rate and rhythm, normal S1/S2, no S3/S4, no murmur. No pretibial or periankle edema.  No carotid bruit.    Abdomen: Soft, nontender, no distention.  Neurologic: Alert and oriented x 3.  Psych: Normal affect. Skin: Normal. Musculoskeletal: No gross deformities. Extremities: No  clubbing or cyanosis.   ECG: Most recent ECG reviewed.      ASSESSMENT AND PLAN: 1. Palpitations: No arrhythmias noted. No indication for metoprolol. Will d/c.  2. Aortic annular calcification: No evidence of valvular stenosis.  Dispo: f/u prn  Kate Sable, M.D., F.A.C.C.

## 2015-11-23 NOTE — Patient Instructions (Signed)
   Stop Metoprolol. Continue all other medications.   Follow up as needed.

## 2015-11-26 ENCOUNTER — Encounter: Payer: Medicaid Other | Admitting: Family Medicine

## 2015-12-04 ENCOUNTER — Encounter: Payer: Medicaid Other | Admitting: Family Medicine

## 2015-12-10 ENCOUNTER — Other Ambulatory Visit: Payer: Self-pay

## 2015-12-10 MED ORDER — PANTOPRAZOLE SODIUM 40 MG PO TBEC: 40.0000 mg | DELAYED_RELEASE_TABLET | Freq: Every day | ORAL | Status: DC

## 2015-12-24 ENCOUNTER — Encounter: Payer: Medicaid Other | Admitting: Family Medicine

## 2015-12-24 ENCOUNTER — Telehealth: Payer: Self-pay | Admitting: Family Medicine

## 2015-12-24 DIAGNOSIS — M6530 Trigger finger, unspecified finger: Secondary | ICD-10-CM

## 2015-12-24 NOTE — Telephone Encounter (Signed)
Patient aware referral has been placed.

## 2015-12-24 NOTE — Telephone Encounter (Signed)
Yes okay for referral, diagnosis recurrent trigger finger

## 2015-12-24 NOTE — Telephone Encounter (Signed)
Is it ok for referral  

## 2016-01-01 ENCOUNTER — Encounter: Payer: Medicaid Other | Admitting: Family Medicine

## 2016-01-01 HISTORY — PX: CARPAL TUNNEL RELEASE: SHX101

## 2016-01-06 ENCOUNTER — Other Ambulatory Visit: Payer: Medicaid Other | Admitting: Adult Health

## 2016-01-11 ENCOUNTER — Other Ambulatory Visit: Payer: Self-pay | Admitting: Family Medicine

## 2016-01-15 ENCOUNTER — Telehealth: Payer: Self-pay | Admitting: Family Medicine

## 2016-01-15 DIAGNOSIS — M549 Dorsalgia, unspecified: Secondary | ICD-10-CM

## 2016-01-15 DIAGNOSIS — M542 Cervicalgia: Secondary | ICD-10-CM

## 2016-01-18 ENCOUNTER — Ambulatory Visit (INDEPENDENT_AMBULATORY_CARE_PROVIDER_SITE_OTHER): Payer: Medicaid Other | Admitting: Adult Health

## 2016-01-18 ENCOUNTER — Other Ambulatory Visit (HOSPITAL_COMMUNITY)
Admission: RE | Admit: 2016-01-18 | Discharge: 2016-01-18 | Disposition: A | Discharge status: Home / Self Care | Payer: Medicaid Other | Source: Ambulatory Visit | Attending: Adult Health | Admitting: Adult Health

## 2016-01-18 ENCOUNTER — Encounter: Payer: Self-pay | Admitting: Adult Health

## 2016-01-18 VITALS — BP 102/70 | HR 88 | Ht 60.0 in | Wt 136.0 lb

## 2016-01-18 DIAGNOSIS — R635 Abnormal weight gain: Secondary | ICD-10-CM | POA: Insufficient documentation

## 2016-01-18 DIAGNOSIS — Z01419 Encounter for gynecological examination (general) (routine) without abnormal findings: Secondary | ICD-10-CM | POA: Diagnosis not present

## 2016-01-18 DIAGNOSIS — Z124 Encounter for screening for malignant neoplasm of cervix: Secondary | ICD-10-CM

## 2016-01-18 DIAGNOSIS — Z1151 Encounter for screening for human papillomavirus (HPV): Secondary | ICD-10-CM | POA: Insufficient documentation

## 2016-01-18 DIAGNOSIS — Z113 Encounter for screening for infections with a predominantly sexual mode of transmission: Secondary | ICD-10-CM | POA: Diagnosis present

## 2016-01-18 DIAGNOSIS — Z0000 Encounter for general adult medical examination without abnormal findings: Secondary | ICD-10-CM | POA: Diagnosis not present

## 2016-01-18 DIAGNOSIS — Z1212 Encounter for screening for malignant neoplasm of rectum: Secondary | ICD-10-CM

## 2016-01-18 DIAGNOSIS — D229 Melanocytic nevi, unspecified: Secondary | ICD-10-CM | POA: Insufficient documentation

## 2016-01-18 HISTORY — DX: Abnormal weight gain: R63.5

## 2016-01-18 HISTORY — DX: Melanocytic nevi, unspecified: D22.9

## 2016-01-18 LAB — HEMOCCULT GUIAC POC 1CARD (OFFICE): FECAL OCCULT BLD: NEGATIVE

## 2016-01-18 NOTE — Telephone Encounter (Signed)
Please address

## 2016-01-18 NOTE — Patient Instructions (Addendum)
Get mammogram now and yearly [phone] Physical in 1 year, pap in 3 if normal See dermatologist for mole removal Follow up with PCP

## 2016-01-18 NOTE — Telephone Encounter (Signed)
Okay to send referral. Caryl Pina, MD [facility] 01/18/2016, 10:54 AM

## 2016-01-18 NOTE — Progress Notes (Signed)
Patient ID: [name], female   DOB: January 21, 1971, 45 y.o.   MRN: [id number] History of Present Illness: Julie [name] is a 45 year old white female in for a well woman gyn exam and pap.She complains of weight gain, esp in stomach area.She has stomach pain and is on Levsin by Julie Castle NP at Julie [name] office. She says she has stopped xanax and alcohol. PCP is [facility] and she sees Julie [name].   Current Medications, Allergies, Past Medical History, Past Surgical History, Family History and Social History were reviewed in [employer] record.     Review of Systems: Patient denies any headaches, hearing loss, fatigue, blurred vision, shortness of breath, chest pain, problems with bowel movements, urination, or intercourse. No joint pain or mood swings.    Physical Exam:BP 102/70 mmHg  Pulse 88  Ht 5' (1.524 m)  Wt 136 lb (61.689 kg)  BMI 26.56 kg/m2  LMP 12/27/2015 General:  Well developed, well nourished, no acute distress Skin:  Warm and dry, has numerous moles on trunk and back, some look like AKs Neck:  Midline trachea, normal thyroid, good ROM, no lymphadenopathy Lungs; Clear to auscultation bilaterally Breast:  No dominant palpable mass, retraction, or nipple discharge, has breast implants and left seems encapsulated, Cardiovascular: Regular rate and rhythm Abdomen:  Soft, non tender, no hepatosplenomegaly Pelvic:  External genitalia is normal in appearance, no lesions.  The vagina is normal in appearance. Urethra has no lesions or masses. The cervix is bulbous. Pap with HPV and GC/CHL performed. Uterus is felt to be normal size, shape, and contour.  No adnexal masses or tenderness noted.Bladder is non tender, no masses felt. Rectal: Good sphincter tone, no polyps, or hemorrhoids felt.  Hemoccult negative. Extremities/musculoskeletal:  No swelling or varicosities noted, no clubbing or cyanosis Psych:  No mood changes, alert and cooperative,seems happy Try  to decrease cigarettes   Impression: Well woman gyn exam and pap Weight gain  Numerous moles    Plan: Get mammogram now(has never had one) and yearly Check CBC,CMP,TSH and lipids,A1c and vitamin D Physical in 1 year, pap in 3 if normal  Follow up with PCP for referral to dermatologist

## 2016-01-19 ENCOUNTER — Encounter: Payer: Self-pay | Admitting: Adult Health

## 2016-01-19 ENCOUNTER — Telehealth: Payer: Self-pay | Admitting: Adult Health

## 2016-01-19 DIAGNOSIS — E785 Hyperlipidemia, unspecified: Secondary | ICD-10-CM

## 2016-01-19 DIAGNOSIS — E559 Vitamin D deficiency, unspecified: Secondary | ICD-10-CM | POA: Insufficient documentation

## 2016-01-19 HISTORY — DX: Hyperlipidemia, unspecified: E78.5

## 2016-01-19 LAB — CBC
HEMOGLOBIN: 12.1 g/dL (ref 11.1–15.9)
Hematocrit: 36.2 % (ref 34.0–46.6)
MCH: 30.6 pg (ref 26.6–33.0)
MCHC: 33.4 g/dL (ref 31.5–35.7)
MCV: 92 fL (ref 79–97)
Platelets: 420 10*3/uL — ABNORMAL HIGH (ref 150–379)
RBC: 3.95 x10E6/uL (ref 3.77–5.28)
RDW: 12.9 % (ref 12.3–15.4)
WBC: 12.4 10*3/uL — ABNORMAL HIGH (ref 3.4–10.8)

## 2016-01-19 LAB — HEMOGLOBIN A1C
ESTIMATED AVERAGE GLUCOSE: 108 mg/dL
HEMOGLOBIN A1C: 5.4 % (ref 4.8–5.6)

## 2016-01-19 LAB — COMPREHENSIVE METABOLIC PANEL
ALBUMIN: 4.1 g/dL (ref 3.5–5.5)
ALT: 11 IU/L (ref 0–32)
AST: 20 IU/L (ref 0–40)
Albumin/Globulin Ratio: 1.5 (ref 1.1–2.5)
Alkaline Phosphatase: 82 IU/L (ref 39–117)
BILIRUBIN TOTAL: 0.3 mg/dL (ref 0.0–1.2)
BUN / CREAT RATIO: 7 — AB (ref 9–23)
BUN: 5 mg/dL — AB (ref 6–24)
CALCIUM: 9.1 mg/dL (ref 8.7–10.2)
CO2: 24 mmol/L (ref 18–29)
CREATININE: 0.71 mg/dL (ref 0.57–1.00)
Chloride: 99 mmol/L (ref 96–106)
GFR, EST AFRICAN AMERICAN: 117 mL/min/{1.73_m2} (ref >59)
GFR, EST NON AFRICAN AMERICAN: 102 mL/min/{1.73_m2} (ref >59)
GLUCOSE: 76 mg/dL (ref 65–99)
Globulin, Total: 2.8 g/dL (ref 1.5–4.5)
Potassium: 4.5 mmol/L (ref 3.5–5.2)
Sodium: 137 mmol/L (ref 134–144)
TOTAL PROTEIN: 6.9 g/dL (ref 6.0–8.5)

## 2016-01-19 LAB — VITAMIN D 25 HYDROXY (VIT D DEFICIENCY, FRACTURES): Vit D, 25-Hydroxy: 12.8 ng/mL — ABNORMAL LOW (ref 30.0–100.0)

## 2016-01-19 LAB — LIPID PANEL
CHOL/HDL RATIO: 5.7 ratio — AB (ref 0.0–4.4)
Cholesterol, Total: 166 mg/dL (ref 100–199)
HDL: 29 mg/dL — AB (ref >39)
LDL Calculated: 72 mg/dL (ref 0–99)
Triglycerides: 323 mg/dL — ABNORMAL HIGH (ref 0–149)
VLDL CHOLESTEROL CAL: 65 mg/dL — AB (ref 5–40)

## 2016-01-19 LAB — TSH: TSH: 0.719 u[IU]/mL (ref 0.450–4.500)

## 2016-01-19 MED ORDER — CHOLECALCIFEROL 125 MCG (5000 UT) PO CAPS: 5000.0000 [IU] | ORAL_CAPSULE | Freq: Every day | ORAL | Status: DC

## 2016-01-19 MED ORDER — PRAVASTATIN SODIUM 40 MG PO TABS: 40.0000 mg | ORAL_TABLET | Freq: Every day | ORAL | Status: DC

## 2016-01-19 NOTE — Telephone Encounter (Signed)
Pt aware of labs and vitamin D def and elevated trig, and decreased HDL, will Rx pravachol 40 mg 1 daily and add vitamin D3 5000 IU daily and recheck CMP, lipids and Vitamin D in 3 months, placed in recall, watch fats, she says she can't walk due to back

## 2016-01-20 ENCOUNTER — Encounter: Payer: Medicaid Other | Admitting: Family Medicine

## 2016-01-20 ENCOUNTER — Telehealth: Payer: Self-pay | Admitting: Adult Health

## 2016-01-20 LAB — CYTOLOGY - PAP

## 2016-01-20 NOTE — Telephone Encounter (Signed)
Pt called stating that Julie [name] wanted her to make another appointment to see her but pt does not remember when. She would like a call back from [place]

## 2016-01-20 NOTE — Telephone Encounter (Signed)
Referral placed and patient aware 

## 2016-01-20 NOTE — Telephone Encounter (Signed)
She is in recall for labs in 3 months

## 2016-01-21 ENCOUNTER — Encounter: Payer: Self-pay | Admitting: Family Medicine

## 2016-01-26 DIAGNOSIS — J3489 Other specified disorders of nose and nasal sinuses: Secondary | ICD-10-CM | POA: Insufficient documentation

## 2016-01-27 ENCOUNTER — Telehealth: Payer: Self-pay | Admitting: Adult Health

## 2016-01-27 NOTE — Telephone Encounter (Signed)
Has rash see PCP

## 2016-01-28 ENCOUNTER — Other Ambulatory Visit: Payer: Self-pay | Admitting: Otolaryngology

## 2016-01-28 DIAGNOSIS — R599 Enlarged lymph nodes, unspecified: Secondary | ICD-10-CM

## 2016-02-02 ENCOUNTER — Telehealth: Payer: Self-pay | Admitting: Family Medicine

## 2016-02-03 ENCOUNTER — Other Ambulatory Visit: Payer: Self-pay

## 2016-02-08 ENCOUNTER — Ambulatory Visit (HOSPITAL_COMMUNITY): Payer: Medicaid Other

## 2016-02-18 ENCOUNTER — Ambulatory Visit (HOSPITAL_COMMUNITY)
Admission: RE | Admit: 2016-02-18 | Discharge: 2016-02-18 | Disposition: A | Discharge status: Home / Self Care | Payer: Medicaid Other | Source: Ambulatory Visit | Attending: Otolaryngology | Admitting: Otolaryngology

## 2016-02-18 ENCOUNTER — Telehealth: Payer: Self-pay

## 2016-02-18 DIAGNOSIS — R918 Other nonspecific abnormal finding of lung field: Secondary | ICD-10-CM | POA: Diagnosis not present

## 2016-02-18 DIAGNOSIS — R590 Localized enlarged lymph nodes: Secondary | ICD-10-CM | POA: Insufficient documentation

## 2016-02-18 DIAGNOSIS — R599 Enlarged lymph nodes, unspecified: Secondary | ICD-10-CM

## 2016-02-18 DIAGNOSIS — M549 Dorsalgia, unspecified: Secondary | ICD-10-CM

## 2016-02-18 MED ORDER — IOHEXOL 300 MG/ML  SOLN
75.0000 mL | Freq: Once | INTRAMUSCULAR | Status: AC | PRN
  Administered 2016-02-18: 75 mL via INTRAVENOUS

## 2016-02-18 NOTE — Telephone Encounter (Signed)
Yes - ok to send referral

## 2016-02-18 NOTE — Telephone Encounter (Signed)
Detailed message left for patient that referral has been placed.

## 2016-02-18 NOTE — Telephone Encounter (Signed)
Patient needs referral to [facility], sees new doctor in [place] this month, but will take a while before they are medicaid certified.  Needs [place] to place a referral in the meantime with her orthopaedic doctor.  Is this ok?

## 2016-03-23 ENCOUNTER — Other Ambulatory Visit: Payer: Self-pay | Admitting: Family Medicine

## 2016-03-24 NOTE — Telephone Encounter (Signed)
Last seen 10/14/15  Dr Warrick Parisian

## 2016-03-28 ENCOUNTER — Other Ambulatory Visit: Payer: Self-pay

## 2016-03-28 DIAGNOSIS — F411 Generalized anxiety disorder: Secondary | ICD-10-CM

## 2016-03-28 NOTE — Telephone Encounter (Signed)
Last seen 10/14/15  Dr Dettinger

## 2016-04-12 ENCOUNTER — Other Ambulatory Visit: Payer: Self-pay | Admitting: Family Medicine

## 2016-05-12 ENCOUNTER — Other Ambulatory Visit: Payer: Self-pay | Admitting: Family Medicine

## 2016-05-13 ENCOUNTER — Other Ambulatory Visit: Payer: Self-pay | Admitting: Family Medicine

## 2016-05-14 NOTE — Telephone Encounter (Signed)
Last seen 10/14/15  Dr Dettinger

## 2016-05-16 ENCOUNTER — Other Ambulatory Visit: Payer: Self-pay | Admitting: Family Medicine

## 2016-06-10 ENCOUNTER — Encounter (INDEPENDENT_AMBULATORY_CARE_PROVIDER_SITE_OTHER): Payer: Self-pay | Admitting: *Deleted

## 2016-06-15 ENCOUNTER — Encounter (INDEPENDENT_AMBULATORY_CARE_PROVIDER_SITE_OTHER): Payer: Self-pay | Admitting: Internal Medicine

## 2016-06-20 ENCOUNTER — Encounter (HOSPITAL_COMMUNITY): Payer: Self-pay | Admitting: *Deleted

## 2016-06-20 ENCOUNTER — Emergency Department (HOSPITAL_COMMUNITY)
Admission: EM | Admit: 2016-06-20 | Discharge: 2016-06-20 | Disposition: A | Discharge status: Home / Self Care | Payer: Medicaid Other | Attending: Emergency Medicine | Admitting: Emergency Medicine

## 2016-06-20 ENCOUNTER — Emergency Department (HOSPITAL_COMMUNITY): Payer: Medicaid Other

## 2016-06-20 DIAGNOSIS — E785 Hyperlipidemia, unspecified: Secondary | ICD-10-CM | POA: Insufficient documentation

## 2016-06-20 DIAGNOSIS — R1031 Right lower quadrant pain: Secondary | ICD-10-CM | POA: Diagnosis not present

## 2016-06-20 DIAGNOSIS — F17210 Nicotine dependence, cigarettes, uncomplicated: Secondary | ICD-10-CM | POA: Insufficient documentation

## 2016-06-20 DIAGNOSIS — R112 Nausea with vomiting, unspecified: Secondary | ICD-10-CM | POA: Diagnosis not present

## 2016-06-20 DIAGNOSIS — R002 Palpitations: Secondary | ICD-10-CM | POA: Insufficient documentation

## 2016-06-20 DIAGNOSIS — F329 Major depressive disorder, single episode, unspecified: Secondary | ICD-10-CM | POA: Diagnosis not present

## 2016-06-20 DIAGNOSIS — M1990 Unspecified osteoarthritis, unspecified site: Secondary | ICD-10-CM | POA: Diagnosis not present

## 2016-06-20 DIAGNOSIS — Z79899 Other long term (current) drug therapy: Secondary | ICD-10-CM | POA: Insufficient documentation

## 2016-06-20 DIAGNOSIS — K589 Irritable bowel syndrome without diarrhea: Secondary | ICD-10-CM

## 2016-06-20 LAB — CBC WITH DIFFERENTIAL/PLATELET
BASOS ABS: 0 10*3/uL (ref 0.0–0.1)
Basophils Relative: 0 %
EOS PCT: 3 %
Eosinophils Absolute: 0.5 10*3/uL (ref 0.0–0.7)
HEMATOCRIT: 34.1 % — AB (ref 36.0–46.0)
Hemoglobin: 11.1 g/dL — ABNORMAL LOW (ref 12.0–15.0)
LYMPHS ABS: 4.3 10*3/uL — AB (ref 0.7–4.0)
LYMPHS PCT: 31 %
MCH: 29.5 pg (ref 26.0–34.0)
MCHC: 32.6 g/dL (ref 30.0–36.0)
MCV: 90.7 fL (ref 78.0–100.0)
MONO ABS: 1.3 10*3/uL — AB (ref 0.1–1.0)
MONOS PCT: 10 %
Neutro Abs: 7.9 10*3/uL — ABNORMAL HIGH (ref 1.7–7.7)
Neutrophils Relative %: 56 %
PLATELETS: 376 10*3/uL (ref 150–400)
RBC: 3.76 MIL/uL — ABNORMAL LOW (ref 3.87–5.11)
RDW: 14.7 % (ref 11.5–15.5)
WBC: 14 10*3/uL — AB (ref 4.0–10.5)

## 2016-06-20 LAB — URINALYSIS, ROUTINE W REFLEX MICROSCOPIC
Bilirubin Urine: NEGATIVE
GLUCOSE, UA: NEGATIVE mg/dL
KETONES UR: NEGATIVE mg/dL
LEUKOCYTES UA: NEGATIVE
NITRITE: NEGATIVE
PROTEIN: NEGATIVE mg/dL
Specific Gravity, Urine: <1.005 — ABNORMAL LOW (ref 1.005–1.030)
pH: 6 (ref 5.0–8.0)

## 2016-06-20 LAB — BASIC METABOLIC PANEL
Anion gap: 5 (ref 5–15)
BUN: 6 mg/dL (ref 6–20)
CALCIUM: 8.8 mg/dL — AB (ref 8.9–10.3)
CO2: 26 mmol/L (ref 22–32)
Chloride: 104 mmol/L (ref 101–111)
Creatinine, Ser: 0.68 mg/dL (ref 0.44–1.00)
GFR calc Af Amer: >60 mL/min (ref >60)
GLUCOSE: 94 mg/dL (ref 65–99)
Potassium: 3.6 mmol/L (ref 3.5–5.1)
Sodium: 135 mmol/L (ref 135–145)

## 2016-06-20 LAB — PREGNANCY, URINE: Preg Test, Ur: NEGATIVE

## 2016-06-20 LAB — URINE MICROSCOPIC-ADD ON

## 2016-06-20 MED ORDER — SODIUM CHLORIDE 0.9 % IV BOLUS (SEPSIS)
500.0000 mL | Freq: Once | INTRAVENOUS | Status: AC
  Administered 2016-06-20: 500 mL via INTRAVENOUS

## 2016-06-20 MED ORDER — ONDANSETRON HCL 4 MG PO TABS: 4.0000 mg | ORAL_TABLET | Freq: Three times a day (TID) | ORAL | Status: DC | PRN

## 2016-06-20 MED ORDER — HYDROMORPHONE HCL 1 MG/ML IJ SOLN
1.0000 mg | Freq: Once | INTRAMUSCULAR | Status: AC
  Administered 2016-06-20: 1 mg via INTRAVENOUS
  Filled 2016-06-20: qty 1

## 2016-06-20 MED ORDER — HYOSCYAMINE SULFATE 0.125 MG SL SUBL: 0.1250 mg | SUBLINGUAL_TABLET | SUBLINGUAL | Status: DC | PRN

## 2016-06-20 MED ORDER — DIATRIZOATE MEGLUMINE & SODIUM 66-10 % PO SOLN
ORAL | Status: AC
  Administered 2016-06-20: 03:00:00
  Filled 2016-06-20: qty 30

## 2016-06-20 MED ORDER — ONDANSETRON HCL 4 MG/2ML IJ SOLN
4.0000 mg | Freq: Once | INTRAMUSCULAR | Status: AC
  Administered 2016-06-20: 4 mg via INTRAVENOUS
  Filled 2016-06-20: qty 2

## 2016-06-20 MED ORDER — IOPAMIDOL (ISOVUE-300) INJECTION 61%
100.0000 mL | Freq: Once | INTRAVENOUS | Status: AC | PRN
  Administered 2016-06-20: 100 mL via INTRAVENOUS

## 2016-06-20 NOTE — Discharge Instructions (Signed)

## 2016-06-20 NOTE — ED Notes (Signed)
Pt c/o lower abd pain that started a week ago but has increasingly gotten worse, admits to intermittent n/v. Denies any diarrhea, pt also states that she has felt her heart fluttering for the past two hours, has hx of anxiety,

## 2016-06-20 NOTE — ED Provider Notes (Signed)
CSN: [id number]     Arrival date & time 06/20/16  0105 History   First MD Initiated Contact with Patient 06/20/16 0112     Chief Complaint  Patient presents with  . Abdominal Pain     (Consider location/radiation/quality/duration/timing/severity/associated sxs/prior Treatment) HPI Comments: Patient presents to the ER for evaluation of right-sided lower abdominal pain. She reports that she has been having intermittent cramping for a week, but tonight the pain became severe and constant. She has had nausea and vomiting associated with the symptoms. She has not had any diarrhea or constipation. Patient reports a history of kidney stones, but reports that the pain she is expressing currently is more severe.  She has been noticing some fluttering in her chest since the pain began. She reports that she feels very anxious and does have a history of anxiety attacks. She is not experiencing chest pain.  Patient is a 45 y.o. female presenting with abdominal pain.  Abdominal Pain Associated symptoms: nausea and vomiting     Past Medical History  Diagnosis Date  . GERD (gastroesophageal reflux disease)   . Anxiety   . Back pain   . Arthritis   . Chronic diarrhea   . Irritable bowel syndrome   . History of nuclear stress test 07/15/2008    lexiscan; mild-mod perfusion defect in mid anteroseptal, apical anterior, apical septal regions (attenuation artifiact), prominent gut uptake activity in infero-apical region; post-stress EF 64%; EKG negative for ischemia; patient experienced CP during test  . Aortic insufficiency 2009    Noted at heart cath  . Depression   . Anger   . RLQ abdominal pain 05/28/2015  . Nausea 05/28/2015  . Vaginal discharge 05/28/2015  . BV (bacterial vaginosis) 05/28/2015  . Diarrhea 05/28/2015  . Neck pain   . Numerous moles 01/18/2016  . Weight gain 01/18/2016  . Dyslipidemia 01/19/2016   Past Surgical History  Procedure Laterality Date  . Cesarean section    . Breast  enhancement surgery    . Colonoscopy  05/27/2011    snare polypectomy   . Tubal ligation    . Cardiac catheterization  1025//2006    no significant CAD, mild-mod depressed LV systolic function, EF 123456, mild-mod AI (Dr. Gerrie Nordmann)   . Transthoracic echocardiogram  99991111    LV systolic function is low-normal; RV is normal in size & function; mild MR, mild TR  . Esophagogastroduodenoscopy N/A 02/25/2014    Procedure: ESOPHAGOGASTRODUODENOSCOPY (EGD);  Surgeon: Rogene Houston, MD;  [facility]: [facility];  Service: Endoscopy;  Laterality: N/A;  730  . Biopsy N/A 02/25/2014    Procedure: BIOPSY;  Surgeon: Rogene Houston, MD;  Location: [facility];  Service: Endoscopy;  Laterality: N/A;  . Knee surgery Right   . Carpal tunnel release Right 01/01/16   Family History  Problem Relation Age of Onset  . Other Mother     heart problems  . Other Brother     neck and back surgery  . Other Maternal Grandmother     brain tumor  . Leukemia Maternal Grandfather    Social History  Substance Use Topics  . Smoking status: Current Every Day Smoker -- 0.50 packs/day for 28 years    Types: Cigarettes    Start date: 10/11/1986  . Smokeless tobacco: Never Used  . Alcohol Use: No     Comment: occasionally - beer; not now   OB History    Gravida Para Term Preterm AB TAB SAB Ectopic Multiple Living  6 4   2  2   4      Review of Systems  Cardiovascular: Positive for palpitations.  Gastrointestinal: Positive for nausea, vomiting and abdominal pain.  All other systems reviewed and are negative.     Allergies  Flagyl and Penicillins  Home Medications   Prior to Admission medications   Medication Sig Start Date End Date Taking? Authorizing Provider  albuterol (PROVENTIL HFA;VENTOLIN HFA) 108 (90 BASE) MCG/ACT inhaler Inhale 1-2 puffs into the lungs every 6 (six) hours as needed for wheezing. 08/19/13  Yes Milton Ferguson, MD  busPIRone (BUSPAR) 15 MG tablet Take 1 tablet (15 mg total) by  mouth 3 (three) times daily. Patient taking differently: Take 15 mg by mouth as needed.  09/11/15  Yes Fransisca Kaufmann Dettinger, MD  citalopram (CELEXA) 20 MG tablet Take 20 mg by mouth daily.   Yes Historical Provider, MD  gabapentin (NEURONTIN) 300 MG capsule Take 300 mg by mouth 2 (two) times daily.   Yes Historical Provider, MD  hyoscyamine (LEVSIN SL) 0.125 MG SL tablet Place 1 tablet (0.125 mg total) under the tongue every 4 (four) hours as needed. 09/29/15  Yes Terri L Setzer, NP  ondansetron (ZOFRAN) 8 MG tablet TAKE 1 TABLET BY MOUTH EVERY 8 HOURS AS NEEDED FOR NAUSEA OR VOMITING 09/07/15  Yes Estill Dooms, NP  pantoprazole (PROTONIX) 40 MG tablet TAKE 1 TABLET ONCE DAILY. 05/17/16  Yes Fransisca Kaufmann Dettinger, MD  pravastatin (PRAVACHOL) 40 MG tablet Take 1 tablet (40 mg total) by mouth daily. 01/19/16  Yes Estill Dooms, NP  traZODone (DESYREL) 100 MG tablet Take 100 mg by mouth at bedtime.   Yes Historical Provider, MD  baclofen (LIORESAL) 10 MG tablet Take 1 tablet (10 mg total) by mouth 3 (three) times daily. Patient taking differently: Take 10 mg by mouth as needed.  10/14/15   Fransisca Kaufmann Dettinger, MD  Cholecalciferol 5000 units capsule Take 1 capsule (5,000 Units total) by mouth daily. 01/19/16   Estill Dooms, NP  hydrOXYzine (VISTARIL) 50 MG capsule Take 1-2 capsules (50-100 mg total) by mouth 4 (four) times daily as needed for itching. Patient taking differently: Take 50-100 mg by mouth 2 (two) times daily.  08/27/15   Fransisca Kaufmann Dettinger, MD  oxyCODONE-acetaminophen (PERCOCET/ROXICET) 5-325 MG tablet Take 1 tablet by mouth every 6 (six) hours as needed for severe pain.    Historical Provider, MD   BP 123/76 mmHg  Pulse 94  Temp(Src) 97.7 F (36.5 C) (Oral)  Resp 20  Ht 5\' 1"  (1.549 m)  Wt 140 lb (63.504 kg)  BMI 26.47 kg/m2  SpO2 100%  LMP 05/19/2016 Physical Exam  Constitutional: She is oriented to person, place, and time. She appears well-developed and well-nourished. No  distress.  HENT:  Head: Normocephalic and atraumatic.  Right Ear: Hearing normal.  Left Ear: Hearing normal.  Nose: Nose normal.  Mouth/Throat: Oropharynx is clear and moist and mucous membranes are normal.  Eyes: Conjunctivae and EOM are normal. Pupils are equal, round, and reactive to light.  Neck: Normal range of motion. Neck supple.  Cardiovascular: Regular rhythm, S1 normal and S2 normal.  Exam reveals no gallop and no friction rub.   No murmur heard. Pulmonary/Chest: Effort normal and breath sounds normal. No respiratory distress. She exhibits no tenderness.  Abdominal: Soft. Normal appearance and bowel sounds are normal. There is no hepatosplenomegaly. There is tenderness in the right lower quadrant. There is no rebound, no guarding, no tenderness at McBurney's  point and negative Murphy's sign. No hernia.  Musculoskeletal: Normal range of motion.  Neurological: She is alert and oriented to person, place, and time. She has normal strength. No cranial nerve deficit or sensory deficit. Coordination normal. GCS eye subscore is 4. GCS verbal subscore is 5. GCS motor subscore is 6.  Skin: Skin is warm, dry and intact. No rash noted. No cyanosis.  Psychiatric: She has a normal mood and affect. Her speech is normal and behavior is normal. Thought content normal.  Nursing note and vitals reviewed.   ED Course  Procedures (including critical care time) Labs Review Labs Reviewed  URINALYSIS, ROUTINE W REFLEX MICROSCOPIC (NOT AT [facility]) - Abnormal; Notable for the following:    Specific Gravity, Urine <1.005 (*)    Hgb urine dipstick SMALL (*)    All other components within normal limits  CBC WITH DIFFERENTIAL/PLATELET - Abnormal; Notable for the following:    WBC 14.0 (*)    RBC 3.76 (*)    Hemoglobin 11.1 (*)    HCT 34.1 (*)    Neutro Abs 7.9 (*)    Lymphs Abs 4.3 (*)    Monocytes Absolute 1.3 (*)    All other components within normal limits  BASIC METABOLIC PANEL - Abnormal; Notable  for the following:    Calcium 8.8 (*)    All other components within normal limits  URINE MICROSCOPIC-ADD ON - Abnormal; Notable for the following:    Squamous Epithelial / LPF 0-5 (*)    Bacteria, UA RARE (*)    All other components within normal limits  PREGNANCY, URINE    Imaging Review Ct Abdomen Pelvis W Contrast  06/20/2016  CLINICAL DATA:  Abdominal pain. EXAM: CT ABDOMEN AND PELVIS WITH CONTRAST TECHNIQUE: Multidetector CT imaging of the abdomen and pelvis was performed using the standard protocol following bolus administration of intravenous contrast. CONTRAST:  125mL ISOVUE-300 IOPAMIDOL (ISOVUE-300) INJECTION 61% COMPARISON:  June 26, 2015 CT scan FINDINGS: Normal lung bases. No free air or free fluid. The gallbladder is decompressed limiting evaluation but no abnormalities are seen. The liver, portal vein, spleen, adrenal glands, and pancreas are normal. The kidneys are normal in appearance. Atherosclerotic changes seen in the non aneurysmal aorta. There is a fat containing umbilical hernia. The stomach and small bowel are normal. The colon and appendix are normal. There is a 2.3 cm prominent follicle in the right ovary. The left ureter is normal in appearance. Uterus is normal. The bladder is unremarkable. No adenopathy or other abnormalities seen in the pelvis. Degenerative changes are seen in the lower lumbar spine. IMPRESSION: 1. No acute abnormalities identified. Electronically Signed   By: Dorise Bullion III M.D   On: 06/20/2016 04:12   I have personally reviewed and evaluated these images and lab results as part of my medical decision-making.   EKG Interpretation   Date/Time:  Monday June 20 2016 01:24:47 EDT Ventricular Rate:  93 PR Interval:    QRS Duration: 78 QT Interval:  388 QTC Calculation: 483 R Axis:   93 Text Interpretation:  Sinus rhythm Borderline right axis deviation  Confirmed by POLLINA  MD, CHRISTOPHER ([id number]) on 06/20/2016 1:26:43 AM      MDM    Final diagnoses:  None  Abdominal pain Palpitations  Presents for evaluation of abdominal pain. Patient reports that she has been having intermittent pain in her lower abdomen for 1 week. Today the pain got worse and she had nausea and vomiting. Patient complaining of severe, sharp and  stabbing pain in the right lower quadrant. She did have tenderness but no signs of peritonitis. Lab work and urinalysis were unremarkable. Patient underwent CT abdomen and pelvis to further evaluate. No acute abnormalities are noted. Patient is feeling much better after hydration and analgesia. He'll be treated with analgesia as an outpatient, follow-up with PCP.  Orpah Greek, MD 06/20/16 [id number]

## 2016-07-21 ENCOUNTER — Ambulatory Visit: Payer: Medicaid Other | Admitting: Adult Health

## 2016-09-19 ENCOUNTER — Other Ambulatory Visit: Payer: Self-pay | Admitting: Family Medicine

## 2016-09-27 ENCOUNTER — Encounter (INDEPENDENT_AMBULATORY_CARE_PROVIDER_SITE_OTHER): Payer: Self-pay | Admitting: *Deleted

## 2016-09-27 ENCOUNTER — Telehealth (INDEPENDENT_AMBULATORY_CARE_PROVIDER_SITE_OTHER): Payer: Self-pay | Admitting: *Deleted

## 2016-09-27 ENCOUNTER — Other Ambulatory Visit (INDEPENDENT_AMBULATORY_CARE_PROVIDER_SITE_OTHER): Payer: Self-pay | Admitting: Internal Medicine

## 2016-09-27 DIAGNOSIS — Z860100 Personal history of colon polyps, unspecified: Secondary | ICD-10-CM | POA: Insufficient documentation

## 2016-09-27 MED ORDER — PEG 3350-KCL-NA BICARB-NACL 420 G PO SOLR: 4000.0000 mL | Freq: Once | ORAL | 0 refills | Status: AC

## 2016-09-27 NOTE — Telephone Encounter (Signed)
Patient needs trilyte 

## 2016-09-27 NOTE — Telephone Encounter (Signed)
agree

## 2016-09-27 NOTE — Telephone Encounter (Signed)
Referring MD/PCP: no PCP per patient   Procedure: tcs w/ propofol  Reason/Indication:  Hx polyps  Has patient had this procedure before?  Yes, 2012  If so, when, by whom and where?    Is there a family history of colon cancer?  no  Who?  What age when diagnosed?    Is patient diabetic?   no      Does patient have prosthetic heart valve or mechanical valve?  no  Do you have a pacemaker?  no  Has patient ever had endocarditis? no  Has patient had joint replacement within last 12 months?  no  Does patient tend to be constipated or take laxatives? no  Does patient have a history of alcohol/drug use?  no  Is patient on Coumadin, Plavix and/or Aspirin? no  Medications: pantoprazole 30 mg daily, celexa 20 mg daily, xyrtec 10 mg daily, trazadone 100 mg bid, gabapentin 500 mg bid  Allergies: pcn, flagyl  Medication Adjustment:   Procedure date & time: 10/21/16 at 1030 (900) -- preop 11/14 @ 1245

## 2016-09-28 ENCOUNTER — Telehealth (INDEPENDENT_AMBULATORY_CARE_PROVIDER_SITE_OTHER): Payer: Self-pay | Admitting: Internal Medicine

## 2016-09-28 ENCOUNTER — Encounter (INDEPENDENT_AMBULATORY_CARE_PROVIDER_SITE_OTHER): Payer: Self-pay | Admitting: Internal Medicine

## 2016-09-28 ENCOUNTER — Ambulatory Visit (INDEPENDENT_AMBULATORY_CARE_PROVIDER_SITE_OTHER): Payer: Medicaid Other | Admitting: Internal Medicine

## 2016-09-28 VITALS — BP 92/62 | HR 60 | Temp 98.2°F | Ht 61.0 in | Wt 133.4 lb

## 2016-09-28 DIAGNOSIS — G8929 Other chronic pain: Secondary | ICD-10-CM

## 2016-09-28 DIAGNOSIS — K649 Unspecified hemorrhoids: Secondary | ICD-10-CM | POA: Diagnosis not present

## 2016-09-28 DIAGNOSIS — R1011 Right upper quadrant pain: Secondary | ICD-10-CM | POA: Diagnosis not present

## 2016-09-28 DIAGNOSIS — K219 Gastro-esophageal reflux disease without esophagitis: Secondary | ICD-10-CM

## 2016-09-28 MED ORDER — HYDROCORTISONE 2.5 % RE CREA: 1.0000 "application " | TOPICAL_CREAM | Freq: Two times a day (BID) | RECTAL | 1 refills | Status: DC

## 2016-09-28 MED ORDER — PANTOPRAZOLE SODIUM 20 MG PO TBEC: 20.0000 mg | DELAYED_RELEASE_TABLET | Freq: Two times a day (BID) | ORAL | 3 refills | Status: DC

## 2016-09-28 NOTE — Telephone Encounter (Signed)
error 

## 2016-09-28 NOTE — Patient Instructions (Addendum)
Rx for Protonix 20mg  BID Rx for Anusol cream sent to her pharmacy.

## 2016-09-28 NOTE — Progress Notes (Signed)
Subjective:    Patient ID: Julie [name], female    DOB: Dec 16, 1970, 45 y.o.   MRN: [id number]  HPI Here today for f/u. She was last seen in October of 2016. Hx of IBS. Hx of colon polyps and is scheduled for a colonoscopy in November.  She tells me she is doing about the same. She has a BM daily. One day she may have 2-3 stools and then she may go 2-3 days and not have any. Her appetite is okay. She does have nausea off an on. Her weight in October of last year was 121. Today her weight is 133.4. She works part time.  Occasionally hs to take Tums at night for her acid reflux. She takes the Protonix in the am. She is avoiding spicy foods.  She has bloating.    02/25/2014 EGD  Indications: Patient is a 45 year old Caucasian female who has chronic diarrhea felt to be secondary to IBS unresponsible therapy. She was screened for celiac disease. Tissue trueness glutaminase antibody IgA and gliadin antibody IgG are both elevated. She is therefore undergoing EGD with duodenal biopsy. She also has chronic GERD and maintained on PPI. Impression: Small sliding-type hernia without evidence of esophagitis. Small amount of food debris in the stomach with patent pylorus and no evidence of peptic ulcer disease. No endoscopic changes of celiac disease noted involving bulbar and post bulbar mucosa. Biopsy taken.      05/27/2011 Colonoscopy with snare polypectomy.  INDICATION: Phylicia is a 45 year old Caucasian female who presents with diarrhea, abdominal cramps, and rectal bleeding for over 2 months. She has been seen in emergency room on 2 occasions and her last hemoglobin in April 2012, was 11.8. when I saw patient in our office on May 10, 2011, I gave her prescription for dicyclomine and she did not try this medicine. She states she just forgot about it. FINAL  DIAGNOSES: 1. Normal terminal ileum. No evidence of endoscopic colitis. 2. A 6-7 mm polyp snared from transverse colon. 3. External hemorrhoids. 4. Suspect she has irritable bowel syndrome and rectal bleeding  secondary to hemorrhoids.  Biopsy: Tubular adenoma.    Review of Systems Past Medical History:  Diagnosis Date  . Anger   . Anxiety   . Aortic insufficiency 2009   Noted at heart cath  . Arthritis   . Back pain   . BV (bacterial vaginosis) 05/28/2015  . Chronic diarrhea   . Depression   . Diarrhea 05/28/2015  . Dyslipidemia 01/19/2016  . GERD (gastroesophageal reflux disease)   . History of nuclear stress test 07/15/2008   lexiscan; mild-mod perfusion defect in mid anteroseptal, apical anterior, apical septal regions (attenuation artifiact), prominent gut uptake activity in infero-apical region; post-stress EF 64%; EKG negative for ischemia; patient experienced CP during test  . Irritable bowel syndrome   . Nausea 05/28/2015  . Neck pain   . Numerous moles 01/18/2016  . RLQ abdominal pain 05/28/2015  . Vaginal discharge 05/28/2015  . Weight gain 01/18/2016    Past Surgical History:  Procedure Laterality Date  . BIOPSY N/A 02/25/2014   Procedure: BIOPSY;  Surgeon: Rogene Houston, MD;  Location: [facility];  Service: Endoscopy;  Laterality: N/A;  . BREAST ENHANCEMENT SURGERY    . CARDIAC CATHETERIZATION  1025//2006   no significant CAD, mild-mod depressed LV systolic function, EF 123456, mild-mod AI (Dr. Gerrie Nordmann)   . CARPAL TUNNEL RELEASE Right 01/01/16  . CESAREAN SECTION    . COLONOSCOPY  05/27/2011  snare polypectomy   . ESOPHAGOGASTRODUODENOSCOPY N/A 02/25/2014   Procedure: ESOPHAGOGASTRODUODENOSCOPY (EGD);  Surgeon: Rogene Houston, MD;  [facility]: [facility];  Service: Endoscopy;  Laterality: N/A;  730  . KNEE SURGERY Right   . TRANSTHORACIC ECHOCARDIOGRAM  99991111   LV systolic function is low-normal; RV is normal in size & function; mild MR, mild TR   . TUBAL LIGATION      Allergies  Allergen Reactions  . Flagyl [Metronidazole Hcl] Nausea And Vomiting  . Penicillins Other (See Comments)    Caused patient to pass out.     Current Outpatient Prescriptions on File Prior to Visit  Medication Sig Dispense Refill  . albuterol (PROVENTIL HFA;VENTOLIN HFA) 108 (90 BASE) MCG/ACT inhaler Inhale 1-2 puffs into the lungs every 6 (six) hours as needed for wheezing. 1 Inhaler 0  . Cholecalciferol 5000 units capsule Take 1 capsule (5,000 Units total) by mouth daily.    . citalopram (CELEXA) 20 MG tablet Take 20 mg by mouth daily.    Marland Kitchen gabapentin (NEURONTIN) 300 MG capsule Take 300 mg by mouth 2 (two) times daily.    . hyoscyamine (LEVSIN SL) 0.125 MG SL tablet Place 1 tablet (0.125 mg total) under the tongue every 4 (four) hours as needed. 30 tablet 0  . ondansetron (ZOFRAN) 4 MG tablet Take 1 tablet (4 mg total) by mouth every 8 (eight) hours as needed for nausea or vomiting. 20 tablet 0  . pantoprazole (PROTONIX) 40 MG tablet TAKE 1 TABLET ONCE DAILY. 30 tablet 3  . pravastatin (PRAVACHOL) 40 MG tablet Take 1 tablet (40 mg total) by mouth daily. 30 tablet 2  . traZODone (DESYREL) 100 MG tablet Take 100 mg by mouth at bedtime.     No current facility-administered medications on file prior to visit.        Objective:   Physical Exam Blood pressure 92/62, pulse 60, temperature 98.2 F (36.8 C), height 5\' 1"  (1.549 m), weight 133 lb 6.4 oz (60.5 kg).  Alert and oriented. Skin warm and dry. Oral mucosa is moist.   . Sclera anicteric, conjunctivae is pink. Thyroid not enlarged. No cervical lymphadenopathy. Lungs clear. Heart regular rate and rhythm.  Abdomen is soft. Bowel sounds are positive. No hepatomegaly. No abdominal masses felt. No tenderness.  No edema to lower extremities.  Small external hemorrhoid, tender to the touch.        Assessment & Plan:  GERD. She will continue the Protonix 20mg  BID. Hemorrhoids: Rx for Hydrocortisone cream  sent to her pharmach Colonoscopy next month

## 2016-10-14 NOTE — Patient Instructions (Signed)
Shaquisha KATOYA OBLANDER  10/14/2016     @PREFPERIOPPHARMACY @   Your procedure is scheduled on 10/21/2016.  Report to [facility] at 9:00 A.M.  Call this number if you have problems the morning of surgery:  [phone]   Remember:  Do not eat food or drink liquids after midnight.  Take these medicines the morning of surgery with A SIP OF WATER Albuterol inhaler and bring with you to hospital, Celexa, Flonase, Levsin if needed,  Zofran if needed, Protonix, Tramadol if needed   Do not wear jewelry, make-up or nail polish.  Do not wear lotions, powders, or perfumes, or deoderant.  Do not shave 48 hours prior to surgery.  Men may shave face and neck.  Do not bring valuables to the hospital.  [facility] is not responsible for any belongings or valuables.  Contacts, dentures or bridgework may not be worn into surgery.  Leave your suitcase in the car.  After surgery it may be brought to your room.  For patients admitted to the hospital, discharge time will be determined by your treatment team.  Patients discharged the day of surgery will not be allowed to drive home.    Please read over the following fact sheets that you were given. Anesthesia Post-op Instructions     PATIENT INSTRUCTIONS POST-ANESTHESIA  IMMEDIATELY FOLLOWING SURGERY:  Do not drive or operate machinery for the first twenty four hours after surgery.  Do not make any important decisions for twenty four hours after surgery or while taking narcotic pain medications or sedatives.  If you develop intractable nausea and vomiting or a severe headache please notify your doctor immediately.  FOLLOW-UP:  Please make an appointment with your surgeon as instructed. You do not need to follow up with anesthesia unless specifically instructed to do so.  WOUND CARE INSTRUCTIONS (if applicable):  Keep a dry clean dressing on the anesthesia/puncture wound site if there is drainage.  Once the wound has quit draining you may leave it open  to air.  Generally you should leave the bandage intact for twenty four hours unless there is drainage.  If the epidural site drains for more than 36-48 hours please call the anesthesia department.  QUESTIONS?:  Please feel free to call your physician or the hospital operator if you have any questions, and they will be happy to assist you.      Colonoscopy A colonoscopy is an exam to look at the entire large intestine (colon). This exam can help find problems such as tumors, polyps, inflammation, and areas of bleeding. The exam takes about 1 hour.  LET [facility] CARE PROVIDER KNOW ABOUT:   Any allergies you have.  All medicines you are taking, including vitamins, herbs, eye drops, creams, and over-the-counter medicines.  Previous problems you or members of your family have had with the use of anesthetics.  Any blood disorders you have.  Previous surgeries you have had.  Medical conditions you have. RISKS AND COMPLICATIONS  Generally, this is a safe procedure. However, as with any procedure, complications can occur. Possible complications include:  Bleeding.  Tearing or rupture of the colon wall.  Reaction to medicines given during the exam.  Infection (rare). BEFORE THE PROCEDURE   Ask your health care provider about changing or stopping your regular medicines.  You may be prescribed an oral bowel prep. This involves drinking a large amount of medicated liquid, starting the day before your procedure. The liquid will cause you to have multiple loose stools  until your stool is almost clear or light green. This cleans out your colon in preparation for the procedure.  Do not eat or drink anything else once you have started the bowel prep, unless your health care provider tells you it is safe to do so.  Arrange for someone to drive you home after the procedure. PROCEDURE   You will be given medicine to help you relax (sedative).  You will lie on your side with your knees  bent.  A long, flexible tube with a light and camera on the end (colonoscope) will be inserted through the rectum and into the colon. The camera sends video back to a computer screen as it moves through the colon. The colonoscope also releases carbon dioxide gas to inflate the colon. This helps your health care provider see the area better.  During the exam, your health care provider may take a small tissue sample (biopsy) to be examined under a microscope if any abnormalities are found.  The exam is finished when the entire colon has been viewed. AFTER THE PROCEDURE   Do not drive for 24 hours after the exam.  You may have a small amount of blood in your stool.  You may pass moderate amounts of gas and have mild abdominal cramping or bloating. This is caused by the gas used to inflate your colon during the exam.  Ask when your test results will be ready and how you will get your results. Make sure you get your test results.   This information is not intended to replace advice given to you by your health care provider. Make sure you discuss any questions you have with your health care provider.   Document Released: 11/18/2000 Document Revised: 09/11/2013 Document Reviewed: 07/29/2013 Elsevier Interactive Patient Education [employer].

## 2016-10-18 ENCOUNTER — Encounter (HOSPITAL_COMMUNITY)
Admission: RE | Admit: 2016-10-18 | Discharge: 2016-10-18 | Disposition: A | Discharge status: Home / Self Care | Payer: Medicaid Other | Source: Ambulatory Visit | Attending: Internal Medicine | Admitting: Internal Medicine

## 2016-10-18 ENCOUNTER — Encounter (HOSPITAL_COMMUNITY): Payer: Self-pay

## 2016-10-18 DIAGNOSIS — K649 Unspecified hemorrhoids: Secondary | ICD-10-CM | POA: Insufficient documentation

## 2016-10-18 DIAGNOSIS — F17200 Nicotine dependence, unspecified, uncomplicated: Secondary | ICD-10-CM | POA: Insufficient documentation

## 2016-10-18 DIAGNOSIS — M1990 Unspecified osteoarthritis, unspecified site: Secondary | ICD-10-CM | POA: Diagnosis not present

## 2016-10-18 DIAGNOSIS — K589 Irritable bowel syndrome without diarrhea: Secondary | ICD-10-CM | POA: Insufficient documentation

## 2016-10-18 DIAGNOSIS — K625 Hemorrhage of anus and rectum: Secondary | ICD-10-CM | POA: Diagnosis not present

## 2016-10-18 DIAGNOSIS — E559 Vitamin D deficiency, unspecified: Secondary | ICD-10-CM | POA: Insufficient documentation

## 2016-10-18 DIAGNOSIS — Z86010 Personal history of colonic polyps: Secondary | ICD-10-CM | POA: Insufficient documentation

## 2016-10-18 DIAGNOSIS — Z01812 Encounter for preprocedural laboratory examination: Secondary | ICD-10-CM | POA: Insufficient documentation

## 2016-10-18 DIAGNOSIS — E785 Hyperlipidemia, unspecified: Secondary | ICD-10-CM | POA: Insufficient documentation

## 2016-10-18 DIAGNOSIS — D229 Melanocytic nevi, unspecified: Secondary | ICD-10-CM | POA: Insufficient documentation

## 2016-10-18 DIAGNOSIS — K219 Gastro-esophageal reflux disease without esophagitis: Secondary | ICD-10-CM | POA: Insufficient documentation

## 2016-10-18 DIAGNOSIS — R002 Palpitations: Secondary | ICD-10-CM | POA: Diagnosis not present

## 2016-10-18 DIAGNOSIS — R635 Abnormal weight gain: Secondary | ICD-10-CM | POA: Diagnosis not present

## 2016-10-18 HISTORY — DX: Personal history of urinary calculi: Z87.442

## 2016-10-18 HISTORY — DX: Unspecified asthma, uncomplicated: J45.909

## 2016-10-18 HISTORY — DX: Unspecified convulsions: R56.9

## 2016-10-18 LAB — CBC WITH DIFFERENTIAL/PLATELET
Basophils Absolute: 0 10*3/uL (ref 0.0–0.1)
Basophils Relative: 0 %
EOS PCT: 3 %
Eosinophils Absolute: 0.4 10*3/uL (ref 0.0–0.7)
HEMATOCRIT: 35.4 % — AB (ref 36.0–46.0)
Hemoglobin: 11.2 g/dL — ABNORMAL LOW (ref 12.0–15.0)
LYMPHS ABS: 3.3 10*3/uL (ref 0.7–4.0)
LYMPHS PCT: 24 %
MCH: 29.2 pg (ref 26.0–34.0)
MCHC: 31.6 g/dL (ref 30.0–36.0)
MCV: 92.2 fL (ref 78.0–100.0)
MONO ABS: 0.8 10*3/uL (ref 0.1–1.0)
MONOS PCT: 5 %
NEUTROS ABS: 9.4 10*3/uL — AB (ref 1.7–7.7)
Neutrophils Relative %: 68 %
PLATELETS: 318 10*3/uL (ref 150–400)
RBC: 3.84 MIL/uL — ABNORMAL LOW (ref 3.87–5.11)
RDW: 15 % (ref 11.5–15.5)
WBC: 13.8 10*3/uL — ABNORMAL HIGH (ref 4.0–10.5)

## 2016-10-18 LAB — BASIC METABOLIC PANEL
ANION GAP: 7 (ref 5–15)
CALCIUM: 8.9 mg/dL (ref 8.9–10.3)
CO2: 25 mmol/L (ref 22–32)
Chloride: 101 mmol/L (ref 101–111)
Creatinine, Ser: 0.69 mg/dL (ref 0.44–1.00)
GFR calc Af Amer: >60 mL/min (ref >60)
GLUCOSE: 81 mg/dL (ref 65–99)
Potassium: 3.5 mmol/L (ref 3.5–5.1)
Sodium: 133 mmol/L — ABNORMAL LOW (ref 135–145)

## 2016-10-18 LAB — HCG, SERUM, QUALITATIVE: Preg, Serum: NEGATIVE

## 2016-10-19 ENCOUNTER — Encounter (INDEPENDENT_AMBULATORY_CARE_PROVIDER_SITE_OTHER): Payer: Self-pay | Admitting: *Deleted

## 2016-10-21 ENCOUNTER — Encounter (HOSPITAL_COMMUNITY): Payer: Self-pay | Admitting: *Deleted

## 2016-10-21 ENCOUNTER — Encounter (HOSPITAL_COMMUNITY)
Admission: RE | Disposition: A | Discharge status: Home / Self Care | Payer: Self-pay | Source: Ambulatory Visit | Attending: Internal Medicine

## 2016-10-21 ENCOUNTER — Ambulatory Visit (HOSPITAL_COMMUNITY)
Admission: RE | Admit: 2016-10-21 | Discharge: 2016-10-21 | Disposition: A | Discharge status: Home / Self Care | Payer: Medicaid Other | Source: Ambulatory Visit | Attending: Internal Medicine | Admitting: Internal Medicine

## 2016-10-21 ENCOUNTER — Ambulatory Visit (HOSPITAL_COMMUNITY): Payer: Medicaid Other | Admitting: Anesthesiology

## 2016-10-21 DIAGNOSIS — Z86010 Personal history of colonic polyps: Secondary | ICD-10-CM | POA: Insufficient documentation

## 2016-10-21 DIAGNOSIS — F419 Anxiety disorder, unspecified: Secondary | ICD-10-CM | POA: Diagnosis not present

## 2016-10-21 DIAGNOSIS — E785 Hyperlipidemia, unspecified: Secondary | ICD-10-CM | POA: Diagnosis not present

## 2016-10-21 DIAGNOSIS — Z800 Family history of malignant neoplasm of digestive organs: Secondary | ICD-10-CM | POA: Insufficient documentation

## 2016-10-21 DIAGNOSIS — K644 Residual hemorrhoidal skin tags: Secondary | ICD-10-CM | POA: Insufficient documentation

## 2016-10-21 DIAGNOSIS — Z880 Allergy status to penicillin: Secondary | ICD-10-CM | POA: Insufficient documentation

## 2016-10-21 DIAGNOSIS — F17210 Nicotine dependence, cigarettes, uncomplicated: Secondary | ICD-10-CM | POA: Diagnosis not present

## 2016-10-21 DIAGNOSIS — K580 Irritable bowel syndrome with diarrhea: Secondary | ICD-10-CM | POA: Diagnosis not present

## 2016-10-21 DIAGNOSIS — Z79899 Other long term (current) drug therapy: Secondary | ICD-10-CM | POA: Diagnosis not present

## 2016-10-21 DIAGNOSIS — K219 Gastro-esophageal reflux disease without esophagitis: Secondary | ICD-10-CM | POA: Diagnosis not present

## 2016-10-21 DIAGNOSIS — J45909 Unspecified asthma, uncomplicated: Secondary | ICD-10-CM | POA: Diagnosis not present

## 2016-10-21 DIAGNOSIS — Z8371 Family history of colonic polyps: Secondary | ICD-10-CM | POA: Insufficient documentation

## 2016-10-21 DIAGNOSIS — F329 Major depressive disorder, single episode, unspecified: Secondary | ICD-10-CM | POA: Diagnosis not present

## 2016-10-21 DIAGNOSIS — Z1211 Encounter for screening for malignant neoplasm of colon: Secondary | ICD-10-CM | POA: Insufficient documentation

## 2016-10-21 DIAGNOSIS — Z09 Encounter for follow-up examination after completed treatment for conditions other than malignant neoplasm: Secondary | ICD-10-CM | POA: Diagnosis not present

## 2016-10-21 DIAGNOSIS — M1990 Unspecified osteoarthritis, unspecified site: Secondary | ICD-10-CM | POA: Insufficient documentation

## 2016-10-21 HISTORY — PX: COLONOSCOPY WITH PROPOFOL: SHX5780

## 2016-10-21 SURGERY — COLONOSCOPY WITH PROPOFOL
Anesthesia: Monitor Anesthesia Care

## 2016-10-21 MED ORDER — PROPOFOL 10 MG/ML IV BOLUS
INTRAVENOUS | Status: AC
  Filled 2016-10-21: qty 40

## 2016-10-21 MED ORDER — CHLORHEXIDINE GLUCONATE CLOTH 2 % EX PADS: 6.0000 | MEDICATED_PAD | Freq: Once | CUTANEOUS | Status: DC

## 2016-10-21 MED ORDER — ONDANSETRON HCL 4 MG/2ML IJ SOLN
INTRAMUSCULAR | Status: AC
  Filled 2016-10-21: qty 2

## 2016-10-21 MED ORDER — MIDAZOLAM HCL 2 MG/2ML IJ SOLN
INTRAMUSCULAR | Status: AC
  Filled 2016-10-21: qty 2

## 2016-10-21 MED ORDER — FENTANYL CITRATE (PF) 100 MCG/2ML IJ SOLN
25.0000 ug | INTRAMUSCULAR | Status: AC | PRN
  Administered 2016-10-21 (×2): 25 ug via INTRAVENOUS

## 2016-10-21 MED ORDER — ALBUTEROL SULFATE (2.5 MG/3ML) 0.083% IN NEBU
INHALATION_SOLUTION | RESPIRATORY_TRACT | Status: AC
  Filled 2016-10-21: qty 3

## 2016-10-21 MED ORDER — LACTATED RINGERS IV SOLN
INTRAVENOUS | Status: DC
  Administered 2016-10-21: 09:00:00 via INTRAVENOUS

## 2016-10-21 MED ORDER — FENTANYL CITRATE (PF) 100 MCG/2ML IJ SOLN
INTRAMUSCULAR | Status: AC
  Filled 2016-10-21: qty 2

## 2016-10-21 MED ORDER — ALBUTEROL SULFATE (2.5 MG/3ML) 0.083% IN NEBU
2.5000 mg | INHALATION_SOLUTION | Freq: Once | RESPIRATORY_TRACT | Status: AC
  Administered 2016-10-21: 2.5 mg via RESPIRATORY_TRACT

## 2016-10-21 MED ORDER — PROPOFOL 500 MG/50ML IV EMUL
INTRAVENOUS | Status: DC | PRN
  Administered 2016-10-21: 125 ug/kg/min via INTRAVENOUS

## 2016-10-21 MED ORDER — PROPOFOL 10 MG/ML IV BOLUS
INTRAVENOUS | Status: DC | PRN
  Administered 2016-10-21 (×2): 10 mg via INTRAVENOUS

## 2016-10-21 MED ORDER — MIDAZOLAM HCL 2 MG/2ML IJ SOLN
1.0000 mg | INTRAMUSCULAR | Status: DC | PRN
  Administered 2016-10-21: 2 mg via INTRAVENOUS

## 2016-10-21 NOTE — Anesthesia Preprocedure Evaluation (Signed)
Anesthesia Evaluation  Patient identified by MRN, date of birth, ID band Patient awake    Reviewed: Allergy & Precautions, NPO status , Patient's Chart, lab work & pertinent test results  Airway Mallampati: II  TM Distance: >3 FB     Dental  (+) Teeth Intact, Partial Upper   Pulmonary asthma , Current Smoker,    breath sounds clear to auscultation       Cardiovascular + Valvular Problems/Murmurs AI  Rhythm:Regular Rate:Normal     Neuro/Psych Seizures -, Well Controlled,  PSYCHIATRIC DISORDERS Anxiety Depression    GI/Hepatic GERD  ,  Endo/Other    Renal/GU      Musculoskeletal   Abdominal   Peds  Hematology   Anesthesia Other Findings   Reproductive/Obstetrics                             Anesthesia Physical Anesthesia Plan  ASA: III  Anesthesia Plan: MAC   Post-op Pain Management:    Induction: Intravenous  Airway Management Planned: Simple Face Mask  Additional Equipment:   Intra-op Plan:   Post-operative Plan:   Informed Consent: I have reviewed the patients History and Physical, chart, labs and discussed the procedure including the risks, benefits and alternatives for the proposed anesthesia with the patient or authorized representative who has indicated his/her understanding and acceptance.     Plan Discussed with:   Anesthesia Plan Comments:         Anesthesia Quick Evaluation

## 2016-10-21 NOTE — Addendum Note (Signed)
Addendum  created 10/21/16 1056 by Ollen Bowl, CRNA   Charge Capture section accepted

## 2016-10-21 NOTE — Anesthesia Postprocedure Evaluation (Signed)
Anesthesia Post Note  Patient: Julie [name]  Procedure(s) Performed: Procedure(s) (LRB): COLONOSCOPY WITH PROPOFOL (N/A)  Patient location during evaluation: PACU Anesthesia Type: MAC Level of consciousness: awake and alert Pain management: pain level controlled Vital Signs Assessment: post-procedure vital signs reviewed and stable Respiratory status: spontaneous breathing Cardiovascular status: blood pressure returned to baseline Postop Assessment: no signs of nausea or vomiting Anesthetic complications: no    Last Vitals:  Vitals:   10/21/16 0909 10/21/16 0910  BP: 107/61 94/61  Pulse:    Resp: 12 (!) 28  Temp:      Last Pain:  Vitals:   10/21/16 0919  TempSrc:   PainSc: 6                  Wallice Granville

## 2016-10-21 NOTE — Op Note (Signed)
[facility] Patient Name: Julie [name] Procedure Date: 10/21/2016 9:03 AM MRN: [id number] Date of Birth: [birth year] Attending MD: Hildred Laser , MD CSN: [id number] Age: 45 Admit Type: Outpatient Procedure:                Colonoscopy Indications:              High risk colon cancer surveillance: Personal                            history of colonic polyps Providers:                Hildred Laser, MD, Otis Peak B. Sharon Seller, RN, Sherlyn Lees, Technician Referring MD:             Rubbie Battiest, NP Medicines:                Propofol per Anesthesia Complications:            No immediate complications. Estimated Blood Loss:     Estimated blood loss: none. Procedure:                Pre-Anesthesia Assessment:                           - Prior to the procedure, a History and Physical                            was performed, and patient medications and                            allergies were reviewed. The patient's tolerance of                            previous anesthesia was also reviewed. The risks                            and benefits of the procedure and the sedation                            options and risks were discussed with the patient.                            All questions were answered, and informed consent                            was obtained. Prior Anticoagulants: The patient                            last took ibuprofen 3 days prior to the procedure.                            ASA Grade Assessment: III - A patient with severe  systemic disease. After reviewing the risks and                            benefits, the patient was deemed in satisfactory                            condition to undergo the procedure.                           After obtaining informed consent, the colonoscope                            was passed under direct vision. Throughout the                            procedure,  the patient's blood pressure, pulse, and                            oxygen saturations were monitored continuously. The                            [id number]) was introduced through the and                            advanced to the the cecum, identified by                            appendiceal orifice and ileocecal valve. The                            colonoscopy was performed without difficulty. The                            patient tolerated the procedure well. The quality                            of the bowel preparation was adequate. The                            ileocecal valve, appendiceal orifice, and rectum                            were photographed. Scope In: 9:25:39 AM Scope Out: 9:43:28 AM Scope Withdrawal Time: 0 hours 7 minutes 53 seconds  Total Procedure Duration: 0 hours 17 minutes 49 seconds  Findings:      The colon (entire examined portion) appeared normal.      External hemorrhoids were found during retroflexion. The hemorrhoids       were small. Impression:               - The entire examined colon is normal.                           - External hemorrhoids.                           -  No specimens collected. Moderate Sedation:      Per Anesthesia Care Recommendation:           - Patient has a contact number available for                            emergencies. The signs and symptoms of potential                            delayed complications were discussed with the                            patient. Return to normal activities tomorrow.                            Written discharge instructions were provided to the                            patient.                           - High fiber diet today.                           - Continue present medications.                           - Repeat colonoscopy in 5 years for surveillance. Procedure Code(s):        --- Professional ---                           [phone], Colonoscopy, flexible;  diagnostic, including                            collection of specimen(s) by brushing or washing,                            when performed (separate procedure) Diagnosis Code(s):        --- Professional ---                           Z86.010, Personal history of colonic polyps                           K64.4, Residual hemorrhoidal skin tags CPT copyright 2016 American Medical Association. All rights reserved. The codes documented in this report are preliminary and upon coder review may  be revised to meet current compliance requirements. Hildred Laser, MD Hildred Laser, MD 10/21/2016 9:51:20 AM This report has been signed electronically. Number of Addenda: 0

## 2016-10-21 NOTE — H&P (Signed)
Julie [name] is an 45 y.o. female.   Chief Complaint: Patient is here for colonoscopy. HPI: Patient is 45 year old Caucasian female was history of colonic adenoma who is here for surveillance colonoscopy. She has history of IBS and remains with irregular bowel movements. She either has diarrhea and rectal constipation. She has blood and tissue at times felt to be secondary to hemorrhoids. She has good appetite her weight is stable. She continues to smoke cigarettes. Family history significant for colonic polyps and her brother and colon carcinoma and a double cousin who was just diagnosed recently at age 34.  Past Medical History:  Diagnosis Date  . Anger   . Anxiety   . Aortic insufficiency 2009   Noted at heart cath  . Arthritis   . Asthma   . Back pain   . BV (bacterial vaginosis) 05/28/2015  . Chronic diarrhea   . Depression   . Diarrhea 05/28/2015  . Dyslipidemia 01/19/2016  . GERD (gastroesophageal reflux disease)   . History of kidney stones    history of  . History of nuclear stress test 07/15/2008   lexiscan; mild-mod perfusion defect in mid anteroseptal, apical anterior, apical septal regions (attenuation artifiact), prominent gut uptake activity in infero-apical region; post-stress EF 64%; EKG negative for ischemia; patient experienced CP during test  . Irritable bowel syndrome     05/28/2015  . Neck pain   . Numerous moles 01/18/2016  . RLQ abdominal pain 05/28/2015  . Seizures ([place])    2 years ago; unknown etiology and none since then and on no meds  .  05/28/2015  .  01/18/2016    Past Surgical History:  Procedure Laterality Date  . BIOPSY N/A 02/25/2014   Procedure: BIOPSY;  Surgeon: Rogene Houston, MD;  Location: [facility];  Service: Endoscopy;  Laterality: N/A;  . BREAST ENHANCEMENT SURGERY    . CARDIAC CATHETERIZATION  1025//2006   no significant CAD, mild-mod depressed LV systolic function, EF 123456, mild-mod AI (Dr. Gerrie Nordmann)   . CARPAL TUNNEL RELEASE  Bilateral 01/01/2016  . CESAREAN SECTION    . COLONOSCOPY  05/27/2011   snare polypectomy   . ESOPHAGOGASTRODUODENOSCOPY N/A 02/25/2014   Procedure: ESOPHAGOGASTRODUODENOSCOPY (EGD);  Surgeon: Rogene Houston, MD;  Location: [facility];  Service: Endoscopy;  Laterality: N/A;  730  . KNEE SURGERY Right   . TRANSTHORACIC ECHOCARDIOGRAM  [id number]   LV systolic function is low-normal; RV is normal in size & function; mild MR, mild TR  . TUBAL LIGATION      Family History  Problem Relation Age of Onset  . Other Mother     heart problems  . Other Brother     neck and back surgery  . Other Maternal Grandmother     brain tumor  . Leukemia Maternal Grandfather    Social History:  reports that she has been smoking Cigarettes.  She started smoking about 30 years ago. She has a 14.00 pack-year smoking history. She has never used smokeless tobacco. She reports that she does not drink alcohol or use drugs.  Allergies:  Allergies  Allergen Reactions  . Flagyl [Metronidazole Hcl] Nausea And Vomiting  . Penicillins Other (See Comments)    Caused patient to pass out Has patient had a PCN reaction causing immediate rash, facial/tongue/throat swelling, SOB or lightheadedness with hypotension: unknown Has patient had a PCN reaction causing severe rash involving mucus membranes or skin necrosis: No Has patient had a PCN reaction that required hospitalization  No Has patient had a PCN reaction occurring within the last 10 years: No If all of the above answers are "NO", then may proceed with Cephalosporin use.      Medications Prior to Admission  Medication Sig Dispense Refill  . cetirizine (ZYRTEC) 10 MG tablet Take 10 mg by mouth daily.    . Cholecalciferol 5000 units capsule Take 1 capsule (5,000 Units total) by mouth daily.    . citalopram (CELEXA) 20 MG tablet Take 20 mg by mouth daily.    . fluticasone (FLONASE) 50 MCG/ACT nasal spray Place 1 spray into both nostrils daily as needed for  allergies or rhinitis.    Marland Kitchen gabapentin (NEURONTIN) 300 MG capsule Take 300 mg by mouth 2 (two) times daily.    . hydrocortisone (ANUSOL-HC) 2.5 % rectal cream Place 1 application rectally 2 (two) times daily. (Patient taking differently: Place 1 application rectally as needed for hemorrhoids. ) 30 g 1  . hydrOXYzine (VISTARIL) 50 MG capsule Take 50 mg by mouth every 6 (six) hours as needed for anxiety.     . hyoscyamine (LEVSIN SL) 0.125 MG SL tablet Place 1 tablet (0.125 mg total) under the tongue every 4 (four) hours as needed. (Patient taking differently: Place 0.125 mg under the tongue every 4 (four) hours as needed for cramping. ) 30 tablet 0  . ibuprofen (ADVIL,MOTRIN) 800 MG tablet Take 800 mg by mouth every 8 (eight) hours as needed for mild pain.    Marland Kitchen ondansetron (ZOFRAN) 4 MG tablet Take 1 tablet (4 mg total) by mouth every 8 (eight) hours as needed for nausea or vomiting. 20 tablet 0  . pantoprazole (PROTONIX) 20 MG tablet Take 1 tablet (20 mg total) by mouth 2 (two) times daily before a meal. 60 tablet 3  . pravastatin (PRAVACHOL) 40 MG tablet Take 1 tablet (40 mg total) by mouth daily. 30 tablet 2  . traMADol (ULTRAM) 50 MG tablet Take 50 mg by mouth every 12 (twelve) hours as needed for moderate pain.     . traZODone (DESYREL) 100 MG tablet Take 150 mg by mouth at bedtime.     Marland Kitchen albuterol (PROVENTIL HFA;VENTOLIN HFA) 108 (90 BASE) MCG/ACT inhaler Inhale 1-2 puffs into the lungs every 6 (six) hours as needed for wheezing. 1 Inhaler 0  . pantoprazole (PROTONIX) 40 MG tablet TAKE 1 TABLET ONCE DAILY. (Patient not taking: Reported on 10/13/2016) 30 tablet 3    No results found for this or any previous visit (from the past 48 hour(s)). No results found.  ROS  Blood pressure 99/68, pulse 67, temperature 97.7 F (36.5 C), temperature source Oral, resp. rate (!) 21, last menstrual period 10/16/2016, SpO2 100 %. Physical Exam  Constitutional: She appears well-developed and well-nourished.   HENT:  Mouth/Throat: Oropharynx is clear and moist.  Eyes: Conjunctivae are normal. No scleral icterus.  Neck: No thyromegaly present.  Cardiovascular: Normal rate, regular rhythm and normal heart sounds.   No murmur heard. Respiratory: Effort normal and breath sounds normal.  GI:  Abdomen is symmetrical soft and nontender without organomegaly or masses.  Musculoskeletal: She exhibits no edema.  Lymphadenopathy:    She has no cervical adenopathy.  Neurological: She is alert.  Skin: Skin is warm and dry.     Assessment/Plan History of colonic adenoma. History of CRC in a second-degree relative. Williams colonoscopy under MAC.  Hildred Laser, MD 10/21/2016, 9:08 AM

## 2016-10-21 NOTE — Discharge Instructions (Addendum)
Resume usual medications and high fiber diet. No driving for 24 hours. Next colonoscopy in 5 years.   Colonoscopy, Adult, Care After This sheet gives you information about how to care for yourself after your procedure. Your health care provider may also give you more specific instructions. If you have problems or questions, contact your health care provider. What can I expect after the procedure? After the procedure, it is common to have:  A small amount of blood in your stool for 24 hours after the procedure.  Some gas.  Mild abdominal cramping or bloating. Follow these instructions at home: General instructions  For the first 24 hours after the procedure:  Do not drive or use machinery.  Do not sign important documents.  Do not drink alcohol.  Do your regular daily activities at a slower pace than normal.  Eat soft, easy-to-digest foods.  Rest often.  Take over-the-counter or prescription medicines only as told by your health care provider.  It is up to you to get the results of your procedure. Ask your health care provider, or the department performing the procedure, when your results will be ready. Relieving cramping and bloating  Try walking around when you have cramps or feel bloated.  Apply heat to your abdomen as told by your health care provider. Use a heat source that your health care provider recommends, such as a moist heat pack or a heating pad.  Place a towel between your skin and the heat source.  Leave the heat on for 20-30 minutes.  Remove the heat if your skin turns bright red. This is especially important if you are unable to feel pain, heat, or cold. You may have a greater risk of getting burned. Eating and drinking  Drink enough fluid to keep your urine clear or pale yellow.  Resume your normal diet as instructed by your health care provider. Avoid heavy or fried foods that are hard to digest.  Avoid drinking alcohol for as long as instructed  by your health care provider. Contact a health care provider if:  You have blood in your stool 2-3 days after the procedure. Get help right away if:  You have more than a small spotting of blood in your stool.  You pass large blood clots in your stool.  Your abdomen is swollen.  You have nausea or vomiting.  You have a fever.  You have increasing abdominal pain that is not relieved with medicine. This information is not intended to replace advice given to you by your health care provider. Make sure you discuss any questions you have with your health care provider. Document Released: 07/05/2004 Document Revised: 08/15/2016 Document Reviewed: 02/02/2016 Elsevier Interactive Patient Education  2017 [employer]  PATIENT INSTRUCTIONS POST-ANESTHESIA  IMMEDIATELY FOLLOWING SURGERY:  Do not drive or operate machinery for the first twenty four hours after surgery.  Do not make any important decisions for twenty four hours after surgery or while taking narcotic pain medications or sedatives.  If you develop intractable nausea and vomiting or a severe headache please notify your doctor immediately.  FOLLOW-UP:  Please make an appointment with your surgeon as instructed. You do not need to follow up with anesthesia unless specifically instructed to do so.  WOUND CARE INSTRUCTIONS (if applicable):  Keep a dry clean dressing on the anesthesia/puncture wound site if there is drainage.  Once the wound has quit draining you may leave it open to air.  Generally you should leave the bandage intact for twenty  four hours unless there is drainage.  If the epidural site drains for more than 36-48 hours please call the anesthesia department.  QUESTIONS?:  Please feel free to call your physician or the hospital operator if you have any questions, and they will be happy to assist you.      High-Fiber Diet Fiber, also called dietary fiber, is a type of carbohydrate found in fruits, vegetables, whole  grains, and beans. A high-fiber diet can have many health benefits. Your health care provider may recommend a high-fiber diet to help:  Prevent constipation. Fiber can make your bowel movements more regular.  Lower your cholesterol.  Relieve hemorrhoids, uncomplicated diverticulosis, or irritable bowel syndrome.  Prevent overeating as part of a weight-loss plan.  Prevent heart disease, type 2 diabetes, and certain cancers. What is my plan? The recommended daily intake of fiber includes:  38 grams for men under age 63.  7 grams for men over age 72.  70 grams for women under age 66.  3 grams for women over age 52. You can get the recommended daily intake of dietary fiber by eating a variety of fruits, vegetables, grains, and beans. Your health care provider may also recommend a fiber supplement if it is not possible to get enough fiber through your diet. What do I need to know about a high-fiber diet?  Fiber supplements have not been widely studied for their effectiveness, so it is better to get fiber through food sources.  Always check the fiber content on thenutrition facts label of any prepackaged food. Look for foods that contain at least 5 grams of fiber per serving.  Ask your dietitian if you have questions about specific foods that are related to your condition, especially if those foods are not listed in the following section.  Increase your daily fiber consumption gradually. Increasing your intake of dietary fiber too quickly may cause bloating, cramping, or gas.  Drink plenty of water. Water helps you to digest fiber. What foods can I eat? Grains  Whole-grain breads. Multigrain cereal. Oats and oatmeal. Brown rice. Barley. Bulgur wheat. [place]. Bran muffins. Popcorn. Rye wafer crackers. Vegetables  Sweet potatoes. Spinach. Kale. Artichokes. Cabbage. Broccoli. Green peas. Carrots. Squash. Fruits  Berries. Pears. Apples. Oranges. Avocados. Prunes and raisins. Dried  figs. Meats and Other Protein Sources  [place], kidney, pinto, and soy beans. Split peas. Lentils. Nuts and seeds. Dairy  Fiber-fortified yogurt. Beverages  Fiber-fortified soy milk. Fiber-fortified orange juice. Other  Fiber bars. The items listed above may not be a complete list of recommended foods or beverages. Contact your dietitian for more options.  What foods are not recommended? Grains  White bread. Pasta made with refined flour. White rice. Vegetables  Fried potatoes. Canned vegetables. Well-cooked vegetables. Fruits  Fruit juice. Cooked, strained fruit. Meats and Other Protein Sources  Fatty cuts of meat. Fried Sales executive or fried fish. Dairy  Milk. Yogurt. Cream cheese. Sour cream. Beverages  Soft drinks. Other  Cakes and pastries. Butter and oils. The items listed above may not be a complete list of foods and beverages to avoid. Contact your dietitian for more information.  What are some tips for including high-fiber foods in my diet?  Eat a wide variety of high-fiber foods.  Make sure that half of all grains consumed each day are whole grains.  Replace breads and cereals made from refined flour or white flour with whole-grain breads and cereals.  Replace white rice with brown rice, bulgur wheat, or millet.  Start  the day with a breakfast that is high in fiber, such as a cereal that contains at least 5 grams of fiber per serving.  Use beans in place of meat in soups, salads, or pasta.  Eat high-fiber snacks, such as berries, raw vegetables, nuts, or popcorn. This information is not intended to replace advice given to you by your health care provider. Make sure you discuss any questions you have with your health care provider. Document Released: 11/21/2005 Document Revised: 04/28/2016 Document Reviewed: 05/06/2014 Elsevier Interactive Patient Education  2017 [employer].

## 2016-10-21 NOTE — Transfer of Care (Signed)
Immediate Anesthesia Transfer of Care Note  Patient: Julie [name]  Procedure(s) Performed: Procedure(s) with comments: COLONOSCOPY WITH PROPOFOL (N/A) - 10:30  Patient Location: PACU  Anesthesia Type:MAC  Level of Consciousness: awake  Airway & Oxygen Therapy: Patient Spontanous Breathing  Post-op Assessment: Report given to RN  Post vital signs: Reviewed  Last Vitals:  Vitals:   10/21/16 0909 10/21/16 0910  BP: 107/61 94/61  Pulse:    Resp: 12 (!) 28  Temp:      Last Pain:  Vitals:   10/21/16 0919  TempSrc:   PainSc: 6       Patients Stated Pain Goal: 6 ([id number])  Complications: No apparent anesthesia complications

## 2016-10-24 ENCOUNTER — Encounter (HOSPITAL_COMMUNITY): Payer: Self-pay | Admitting: Internal Medicine

## 2016-11-30 ENCOUNTER — Ambulatory Visit: Payer: Self-pay | Admitting: Urology

## 2016-12-22 ENCOUNTER — Emergency Department (HOSPITAL_COMMUNITY)
Admission: EM | Admit: 2016-12-22 | Discharge: 2016-12-22 | Disposition: A | Discharge status: Home / Self Care | Payer: Medicaid Other | Attending: Emergency Medicine | Admitting: Emergency Medicine

## 2016-12-22 ENCOUNTER — Emergency Department (HOSPITAL_COMMUNITY): Payer: Medicaid Other

## 2016-12-22 ENCOUNTER — Encounter (HOSPITAL_COMMUNITY): Payer: Self-pay

## 2016-12-22 DIAGNOSIS — R112 Nausea with vomiting, unspecified: Secondary | ICD-10-CM | POA: Diagnosis not present

## 2016-12-22 DIAGNOSIS — Z79899 Other long term (current) drug therapy: Secondary | ICD-10-CM | POA: Diagnosis not present

## 2016-12-22 DIAGNOSIS — R197 Diarrhea, unspecified: Secondary | ICD-10-CM | POA: Insufficient documentation

## 2016-12-22 DIAGNOSIS — R1084 Generalized abdominal pain: Secondary | ICD-10-CM | POA: Diagnosis present

## 2016-12-22 DIAGNOSIS — F17210 Nicotine dependence, cigarettes, uncomplicated: Secondary | ICD-10-CM | POA: Diagnosis not present

## 2016-12-22 DIAGNOSIS — N8500 Endometrial hyperplasia, unspecified: Secondary | ICD-10-CM | POA: Insufficient documentation

## 2016-12-22 DIAGNOSIS — J45909 Unspecified asthma, uncomplicated: Secondary | ICD-10-CM | POA: Diagnosis not present

## 2016-12-22 DIAGNOSIS — R9389 Abnormal findings on diagnostic imaging of other specified body structures: Secondary | ICD-10-CM

## 2016-12-22 LAB — CBC WITH DIFFERENTIAL/PLATELET
BASOS ABS: 0 10*3/uL (ref 0.0–0.1)
BASOS PCT: 0 %
Eosinophils Absolute: 0 10*3/uL (ref 0.0–0.7)
Eosinophils Relative: 0 %
HEMATOCRIT: 36.4 % (ref 36.0–46.0)
HEMOGLOBIN: 11.7 g/dL — AB (ref 12.0–15.0)
LYMPHS PCT: 10 %
Lymphs Abs: 1.9 10*3/uL (ref 0.7–4.0)
MCH: 28.1 pg (ref 26.0–34.0)
MCHC: 32.1 g/dL (ref 30.0–36.0)
MCV: 87.3 fL (ref 78.0–100.0)
MONO ABS: 1.2 10*3/uL — AB (ref 0.1–1.0)
Monocytes Relative: 7 %
NEUTROS ABS: 15.5 10*3/uL — AB (ref 1.7–7.7)
Neutrophils Relative %: 83 %
Platelets: 415 10*3/uL — ABNORMAL HIGH (ref 150–400)
RBC: 4.17 MIL/uL (ref 3.87–5.11)
RDW: 15.4 % (ref 11.5–15.5)
WBC: 18.7 10*3/uL — AB (ref 4.0–10.5)

## 2016-12-22 LAB — COMPREHENSIVE METABOLIC PANEL
ALK PHOS: 81 U/L (ref 38–126)
ALT: 17 U/L (ref 14–54)
AST: 26 U/L (ref 15–41)
Albumin: 4.4 g/dL (ref 3.5–5.0)
Anion gap: 16 — ABNORMAL HIGH (ref 5–15)
BUN: 7 mg/dL (ref 6–20)
CALCIUM: 9.1 mg/dL (ref 8.9–10.3)
CHLORIDE: 100 mmol/L — AB (ref 101–111)
CO2: 21 mmol/L — ABNORMAL LOW (ref 22–32)
CREATININE: 0.76 mg/dL (ref 0.44–1.00)
Glucose, Bld: 118 mg/dL — ABNORMAL HIGH (ref 65–99)
Potassium: 3.1 mmol/L — ABNORMAL LOW (ref 3.5–5.1)
Sodium: 137 mmol/L (ref 135–145)
Total Bilirubin: 0.7 mg/dL (ref 0.3–1.2)
Total Protein: 8.2 g/dL — ABNORMAL HIGH (ref 6.5–8.1)

## 2016-12-22 LAB — URINALYSIS, MICROSCOPIC (REFLEX): WBC UA: NONE SEEN WBC/hpf (ref 0–5)

## 2016-12-22 LAB — URINALYSIS, ROUTINE W REFLEX MICROSCOPIC
Glucose, UA: NEGATIVE mg/dL
KETONES UR: 40 mg/dL — AB
LEUKOCYTES UA: NEGATIVE
NITRITE: NEGATIVE
PH: 6.5 (ref 5.0–8.0)
Protein, ur: 30 mg/dL — AB
Specific Gravity, Urine: >1.03 — ABNORMAL HIGH (ref 1.005–1.030)

## 2016-12-22 LAB — LIPASE, BLOOD: LIPASE: 12 U/L (ref 11–51)

## 2016-12-22 LAB — PREGNANCY, URINE: Preg Test, Ur: NEGATIVE

## 2016-12-22 MED ORDER — SODIUM CHLORIDE 0.9 % IV BOLUS (SEPSIS)
1000.0000 mL | Freq: Once | INTRAVENOUS | Status: AC
  Administered 2016-12-22: 1000 mL via INTRAVENOUS

## 2016-12-22 MED ORDER — ONDANSETRON HCL 4 MG/2ML IJ SOLN
4.0000 mg | INTRAMUSCULAR | Status: AC | PRN
  Administered 2016-12-22 (×2): 4 mg via INTRAVENOUS
  Filled 2016-12-22 (×3): qty 2

## 2016-12-22 MED ORDER — LOPERAMIDE HCL 2 MG PO TABS: 2.0000 mg | ORAL_TABLET | Freq: Four times a day (QID) | ORAL | 0 refills | Status: DC | PRN

## 2016-12-22 MED ORDER — SODIUM CHLORIDE 0.9 % IV SOLN
INTRAVENOUS | Status: DC
  Administered 2016-12-22: 10:00:00 via INTRAVENOUS

## 2016-12-22 MED ORDER — IOPAMIDOL (ISOVUE-300) INJECTION 61%
100.0000 mL | Freq: Once | INTRAVENOUS | Status: AC | PRN
  Administered 2016-12-22: 100 mL via INTRAVENOUS

## 2016-12-22 MED ORDER — PROMETHAZINE HCL 25 MG/ML IJ SOLN
12.5000 mg | Freq: Once | INTRAMUSCULAR | Status: AC
  Administered 2016-12-22: 12.5 mg via INTRAVENOUS
  Filled 2016-12-22: qty 1

## 2016-12-22 MED ORDER — PROMETHAZINE HCL 25 MG PO TABS: 25.0000 mg | ORAL_TABLET | Freq: Four times a day (QID) | ORAL | 1 refills | Status: DC | PRN

## 2016-12-22 MED ORDER — POTASSIUM CHLORIDE CRYS ER 20 MEQ PO TBCR
40.0000 meq | EXTENDED_RELEASE_TABLET | Freq: Once | ORAL | Status: AC
  Administered 2016-12-22: 40 meq via ORAL
  Filled 2016-12-22: qty 2

## 2016-12-22 MED ORDER — ONDANSETRON 4 MG PO TBDP: 4.0000 mg | ORAL_TABLET | Freq: Three times a day (TID) | ORAL | 1 refills | Status: DC | PRN

## 2016-12-22 MED ORDER — MORPHINE SULFATE (PF) 4 MG/ML IV SOLN
4.0000 mg | INTRAVENOUS | Status: DC | PRN
  Administered 2016-12-22: 4 mg via INTRAVENOUS
  Filled 2016-12-22 (×3): qty 1

## 2016-12-22 MED ORDER — DICYCLOMINE HCL 10 MG/ML IM SOLN
20.0000 mg | Freq: Once | INTRAMUSCULAR | Status: AC
  Administered 2016-12-22: 20 mg via INTRAMUSCULAR
  Filled 2016-12-22: qty 2

## 2016-12-22 MED ORDER — HYDROCODONE-ACETAMINOPHEN 5-325 MG PO TABS: 1.0000 | ORAL_TABLET | Freq: Four times a day (QID) | ORAL | 0 refills | Status: DC | PRN

## 2016-12-22 NOTE — ED Notes (Signed)
Patient assisted to restroom back to room. Patient told to get a urine sample while in the restroom. Patient did not get one at this time.

## 2016-12-22 NOTE — Discharge Instructions (Signed)
Follow-up with your OB/GYN doctor for the endometrial thickening that were seen on the CT scan. Other symptoms seem to be related to a viral gastroenteritis. Take Imodium for the diarrhea. Take the Zofran and Phenergan as needed for the vomiting. Take the hydrocodone as needed for pain. Return for any new or worse symptoms. Would expect some improvement over the next 24 hours.

## 2016-12-22 NOTE — ED Notes (Signed)
Patient given a Sprite at this time.

## 2016-12-22 NOTE — ED Notes (Signed)
Per edp. Pt can go over for ct. Ct aware.

## 2016-12-22 NOTE — ED Provider Notes (Signed)
[facility] DEPT Provider Note   CSN: [id number] Arrival date & time: 12/22/16  [id number]     History   Chief Complaint No chief complaint on file.   HPI Julie [name] is a 46 y.o. female.  HPI  Pt was seen at 0935.  Per pt, c/o gradual onset and persistence of multiple intermittent episodes of N/V/D that began last night.   Describes the stools as "watery." Has been associated with generalized abd "pain." Denies CP/SOB, no back pain, no fevers, no black or blood in stools or emesis.    Past Medical History:  Diagnosis Date  . Anger   . Anxiety   . Aortic insufficiency 2009   Noted at heart cath  . Arthritis   . Asthma   . Back pain   . BV (bacterial vaginosis) 05/28/2015  . Chronic diarrhea   . Depression   . Diarrhea 05/28/2015  . Dyslipidemia 01/19/2016  . GERD (gastroesophageal reflux disease)   . History of kidney stones    history of  . History of nuclear stress test 07/15/2008   lexiscan; mild-mod perfusion defect in mid anteroseptal, apical anterior, apical septal regions (attenuation artifiact), prominent gut uptake activity in infero-apical region; post-stress EF 64%; EKG negative for ischemia; patient experienced CP during test  . Irritable bowel syndrome   . Nausea 05/28/2015  . Neck pain   . Numerous moles 01/18/2016  . RLQ abdominal pain 05/28/2015  . Seizures ([place])    2 years ago; unknown etiology and none since then and on no meds  . Vaginal discharge 05/28/2015  . Weight gain 01/18/2016    Patient Active Problem List   Diagnosis Date Noted  . Hx of colonic polyps 09/27/2016  . Vitamin D deficiency 01/19/2016  . Dyslipidemia 01/19/2016  . Numerous moles 01/18/2016  . Weight gain 01/18/2016  . Generalized anxiety disorder 08/27/2015  . Tobacco abuse 02/07/2014  . Palpitations 12/26/2013  . Rectal bleeding 11/12/2013  . Hemorrhoids 11/12/2013  . Nausea alone 11/12/2013  . IBS (irritable bowel syndrome) 08/16/2012  . GERD (gastroesophageal reflux  disease) 08/16/2012  . Arthritis 08/16/2012    Past Surgical History:  Procedure Laterality Date  . BIOPSY N/A 02/25/2014   Procedure: BIOPSY;  Surgeon: Rogene Houston, MD;  Location: [facility];  Service: Endoscopy;  Laterality: N/A;  . BREAST ENHANCEMENT SURGERY    . CARDIAC CATHETERIZATION  1025//2006   no significant CAD, mild-mod depressed LV systolic function, EF 123456, mild-mod AI (Dr. Gerrie Nordmann)   . CARPAL TUNNEL RELEASE Bilateral 01/01/2016  . CESAREAN SECTION    . COLONOSCOPY  05/27/2011   snare polypectomy   . COLONOSCOPY WITH PROPOFOL N/A 10/21/2016   Procedure: COLONOSCOPY WITH PROPOFOL;  Surgeon: Rogene Houston, MD;  Location: [facility];  Service: Endoscopy;  Laterality: N/A;  10:30  . ESOPHAGOGASTRODUODENOSCOPY N/A 02/25/2014   Procedure: ESOPHAGOGASTRODUODENOSCOPY (EGD);  Surgeon: Rogene Houston, MD;  Location: [facility];  Service: Endoscopy;  Laterality: N/A;  730  . KNEE SURGERY Right   . TRANSTHORACIC ECHOCARDIOGRAM  [id number]   LV systolic function is low-normal; RV is normal in size & function; mild MR, mild TR  . TUBAL LIGATION      OB History    Gravida Para Term Preterm AB Living   6 4     2 4    SAB TAB Ectopic Multiple Live Births   2               Home  Medications    Prior to Admission medications   Medication Sig Start Date End Date Taking? Authorizing Provider  albuterol (PROVENTIL HFA;VENTOLIN HFA) 108 (90 BASE) MCG/ACT inhaler Inhale 1-2 puffs into the lungs every 6 (six) hours as needed for wheezing. 08/19/13   Milton Ferguson, MD  cetirizine (ZYRTEC) 10 MG tablet Take 10 mg by mouth daily. 02/19/15   Historical Provider, MD  Cholecalciferol 5000 units capsule Take 1 capsule (5,000 Units total) by mouth daily. 01/19/16   Estill Dooms, NP  citalopram (CELEXA) 20 MG tablet Take 20 mg by mouth daily.    Historical Provider, MD  fluticasone (FLONASE) 50 MCG/ACT nasal spray Place 1 spray into both nostrils daily as needed for allergies  or rhinitis.    Historical Provider, MD  gabapentin (NEURONTIN) 300 MG capsule Take 300 mg by mouth 2 (two) times daily.    Historical Provider, MD  hydrocortisone (ANUSOL-HC) 2.5 % rectal cream Place 1 application rectally 2 (two) times daily. Patient taking differently: Place 1 application rectally as needed for hemorrhoids.  09/28/16   Butch Penny, NP  hydrOXYzine (VISTARIL) 50 MG capsule Take 50 mg by mouth every 6 (six) hours as needed for anxiety.  08/27/15   Historical Provider, MD  hyoscyamine (LEVSIN SL) 0.125 MG SL tablet Place 1 tablet (0.125 mg total) under the tongue every 4 (four) hours as needed. Patient taking differently: Place 0.125 mg under the tongue every 4 (four) hours as needed for cramping.  06/20/16   Orpah Greek, MD  ibuprofen (ADVIL,MOTRIN) 800 MG tablet Take 800 mg by mouth every 8 (eight) hours as needed for mild pain.    Historical Provider, MD  ondansetron (ZOFRAN) 4 MG tablet Take 1 tablet (4 mg total) by mouth every 8 (eight) hours as needed for nausea or vomiting. 06/20/16   Orpah Greek, MD  pantoprazole (PROTONIX) 20 MG tablet Take 1 tablet (20 mg total) by mouth 2 (two) times daily before a meal. 09/28/16   Butch Penny, NP  pravastatin (PRAVACHOL) 40 MG tablet Take 1 tablet (40 mg total) by mouth daily. 01/19/16   Estill Dooms, NP  traMADol (ULTRAM) 50 MG tablet Take 50 mg by mouth every 12 (twelve) hours as needed for moderate pain.     Historical Provider, MD  traZODone (DESYREL) 100 MG tablet Take 150 mg by mouth at bedtime.     Historical Provider, MD    Family History Family History  Problem Relation Age of Onset  . Other Mother     heart problems  . Other Brother     neck and back surgery  . Other Maternal Grandmother     brain tumor  . Leukemia Maternal Grandfather     Social History Social History  Substance Use Topics  . Smoking status: Current Every Day Smoker    Packs/day: 0.50    Years: 28.00    Types:  Cigarettes    Start date: 10/11/1986  . Smokeless tobacco: Never Used  . Alcohol use No     Comment: occasionally - beer; not now     Allergies   Flagyl [metronidazole hcl] and Penicillins   Review of Systems Review of Systems ROS: Statement: All systems negative except as marked or noted in the HPI; Constitutional: Negative for fever and chills. ; ; Eyes: Negative for eye pain, redness and discharge. ; ; ENMT: Negative for ear pain, hoarseness, nasal congestion, sinus pressure and sore throat. ; ; Cardiovascular: Negative for chest  pain, palpitations, diaphoresis, dyspnea and peripheral edema. ; ; Respiratory: Negative for cough, wheezing and stridor. ; ; Gastrointestinal: +N/V/D, abd pain. Negative for blood in stool, hematemesis, jaundice and rectal bleeding. . ; ; Genitourinary: Negative for dysuria, flank pain and hematuria. ; ; Musculoskeletal: Negative for back pain and neck pain. Negative for swelling and trauma.; ; Skin: Negative for pruritus, rash, abrasions, blisters, bruising and skin lesion.; ; Neuro: Negative for headache, lightheadedness and neck stiffness. Negative for weakness, altered level of consciousness, altered mental status, extremity weakness, paresthesias, involuntary movement, seizure and syncope.       Physical Exam Updated Vital Signs BP 129/63 (BP Location: Right Arm)   Pulse 60   Temp 98.1 F (36.7 C) (Oral)   Resp 26   Ht 5\' 1"  (1.549 m)   Wt 130 lb (59 kg)   SpO2 100%   BMI 24.56 kg/m   Physical Exam 0940: Physical examination:  Nursing notes reviewed; Vital signs and O2 SAT reviewed;  Constitutional: Well developed, Well nourished, Well hydrated, Uncomfortable appearing, rolling back and forth on stretcher..; Head:  Normocephalic, atraumatic; Eyes: EOMI, PERRL, No scleral icterus; ENMT: Mouth and pharynx normal, Mucous membranes moist; Neck: Supple, Full range of motion, No lymphadenopathy; Cardiovascular: Regular rate and rhythm, No gallop;  Respiratory: Breath sounds clear & equal bilaterally, No wheezes.  Speaking full sentences with ease, Normal respiratory effort/excursion; Chest: Nontender, Movement normal; Abdomen: +actively vomiting multiple times during exam. Soft, +diffuse tenderness to palp. No rebound or guarding. Nondistended, Normal bowel sounds; Genitourinary: No CVA tenderness; Extremities: Pulses normal, No tenderness, No edema, No calf edema or asymmetry.; Neuro: AA&Ox3, Major CN grossly intact.  Speech clear. No gross focal motor or sensory deficits in extremities.; Skin: Color normal, Warm, Dry.   ED Treatments / Results  Labs (all labs ordered are listed, but only abnormal results are displayed)   EKG  EKG Interpretation None       Radiology   Procedures Procedures (including critical care time)  Medications Ordered in ED Medications  0.9 %  sodium chloride infusion (not administered)  dicyclomine (BENTYL) injection 20 mg (not administered)  promethazine (PHENERGAN) injection 12.5 mg (not administered)     Initial Impression / Assessment and Plan / ED Course  I have reviewed the triage vital signs and the nursing notes.  Pertinent labs & imaging results that were available during my care of the patient were reviewed by me and considered in my medical decision making (see chart for details).  MDM Reviewed: previous chart, nursing note and vitals Reviewed previous: labs and CT scan Interpretation: labs and CT scan   Results for orders placed or performed during the hospital encounter of 12/22/16  Comprehensive metabolic panel  Result Value Ref Range   Sodium 137 135 - 145 mmol/L   Potassium 3.1 (L) 3.5 - 5.1 mmol/L   Chloride 100 (L) 101 - 111 mmol/L   CO2 21 (L) 22 - 32 mmol/L   Glucose, Bld 118 (H) 65 - 99 mg/dL   BUN 7 6 - 20 mg/dL   Creatinine, Ser 0.76 0.44 - 1.00 mg/dL   Calcium 9.1 8.9 - 10.3 mg/dL   Total Protein 8.2 (H) 6.5 - 8.1 g/dL   Albumin 4.4 3.5 - 5.0 g/dL   AST 26 15  - 41 U/L   ALT 17 14 - 54 U/L   Alkaline Phosphatase 81 38 - 126 U/L   Total Bilirubin 0.7 0.3 - 1.2 mg/dL   GFR calc non Af  Amer >60 >60 mL/min   GFR calc Af Amer >60 >60 mL/min   Anion gap 16 (H) 5 - 15  Lipase, blood  Result Value Ref Range   Lipase 12 11 - 51 U/L  CBC with Differential  Result Value Ref Range   WBC 18.7 (H) 4.0 - 10.5 K/uL   RBC 4.17 3.87 - 5.11 MIL/uL   Hemoglobin 11.7 (L) 12.0 - 15.0 g/dL   HCT 36.4 36.0 - 46.0 %   MCV 87.3 78.0 - 100.0 fL   MCH 28.1 26.0 - 34.0 pg   MCHC 32.1 30.0 - 36.0 g/dL   RDW 15.4 11.5 - 15.5 %   Platelets 415 (H) 150 - 400 K/uL   Neutrophils Relative % 83 %   Neutro Abs 15.5 (H) 1.7 - 7.7 K/uL   Lymphocytes Relative 10 %   Lymphs Abs 1.9 0.7 - 4.0 K/uL   Monocytes Relative 7 %   Monocytes Absolute 1.2 (H) 0.1 - 1.0 K/uL   Eosinophils Relative 0 %   Eosinophils Absolute 0.0 0.0 - 0.7 K/uL   Basophils Relative 0 %   Basophils Absolute 0.0 0.0 - 0.1 K/uL  Urinalysis, Routine w reflex microscopic  Result Value Ref Range   Color, Urine YELLOW YELLOW   APPearance HAZY (A) CLEAR   Specific Gravity, Urine >1.030 (H) 1.005 - 1.030   pH 6.5 5.0 - 8.0   Glucose, UA NEGATIVE NEGATIVE mg/dL   Hgb urine dipstick MODERATE (A) NEGATIVE   Bilirubin Urine SMALL (A) NEGATIVE   Ketones, ur 40 (A) NEGATIVE mg/dL   Protein, ur 30 (A) NEGATIVE mg/dL   Nitrite NEGATIVE NEGATIVE   Leukocytes, UA NEGATIVE NEGATIVE  Pregnancy, urine  Result Value Ref Range   Preg Test, Ur NEGATIVE NEGATIVE  Urinalysis, Microscopic (reflex)  Result Value Ref Range   RBC / HPF TOO NUMEROUS TO COUNT 0 - 5 RBC/hpf   WBC, UA NONE SEEN 0 - 5 WBC/hpf   Bacteria, UA FEW (A) NONE SEEN   Squamous Epithelial / LPF 0-5 (A) NONE SEEN   Ct Abdomen Pelvis W Contrast Result Date: 12/22/2016 CLINICAL DATA:  Generalized abdominal pain, nausea and vomiting starting this morning, history of inflammatory bowel disease EXAM: CT ABDOMEN AND PELVIS WITH CONTRAST TECHNIQUE:  Multidetector CT imaging of the abdomen and pelvis was performed using the standard protocol following bolus administration of intravenous contrast. CONTRAST:  152mL ISOVUE-300 IOPAMIDOL (ISOVUE-300) INJECTION 61% COMPARISON:  06/20/2016 FINDINGS: Lower chest: Lung bases shows no acute findings. Hepatobiliary: No focal liver abnormality is seen. No gallstones, gallbladder wall thickening, or biliary dilatation. Pancreas: Unremarkable. No pancreatic ductal dilatation or surrounding inflammatory changes. Spleen: Normal in size without focal abnormality. Adrenals/Urinary Tract: No adrenal gland mass. Enhanced kidneys are symmetrical in size. No hydronephrosis or hydroureter. The urinary bladder is unremarkable. Delayed renal images shows bilateral renal symmetrical excretion. Bilateral visualized proximal ureter is unremarkable. Stomach/Bowel: There is no small bowel obstruction. Mild gaseous distended small bowel loop in left lower abdomen suspicious for mild ileus or enteritis. There is no definite evidence of small bowel obstruction. Some liquid stool noted in right colon and proximal transverse colon suspicious for diarrhea. No definite evidence of pericolonic inflammation. No evidence of acute colitis or diverticulitis. A normal appendix noted in coronal image 58. Vascular/Lymphatic: No aortic aneurysm.  No adenopathy. Reproductive: Minimal retroverted uterus. There is hyperemic endometrium. Please correlate with phase of menstrual cycle or endometritis. Post tubal ligation surgical clips are noted. Other: No adnexal mass. No  ascites or free abdominal air. No inguinal adenopathy. Musculoskeletal: Mild degenerative changes lower thoracic and lumbar spine. Alignment and vertebral body heights are preserved. IMPRESSION: 1. Mild segmental gaseous distension of small bowel in left lower abdomen please see axial image 50 and 45. Findings suspicious for mild ileus or enteritis. No definite evidence of small bowel  obstruction. 2. There is some liquid stool within right colon proximal transverse colon. Diarrhea cannot be excluded. 3. No pericecal inflammation. Normal appendix partially visualized axial image 53. 4. Minimal retroflexed uterus. Mild hyperemic endometrium. Please correlate with phase of menstrual cycle or inflammation/endometritis. Post tubal ligation surgical clips are noted. No adnexal mass. 5. No hydronephrosis or hydroureter. Electronically Signed   By: Lahoma Crocker M.D.   On: 12/22/2016 13:08    1610:  Pt not currently on her menses. Denies pelvic pain, vaginal discharge. CT findings d/w pt; f/u OB/GYN MD. Pt afebrile, resps easy, abd benign. Pt attempted PO, was able to take potassium and fluids, but c/o increased abd cramping. Pain meds given. Pt is now asleep. Will dose further IVF and re-PO challenge when awakens. Tx symptomatically, f/u PMD and OB/GYN MD. Sign out to Dr. Rogene Houston.     Final Clinical Impressions(s) / ED Diagnoses   Final diagnoses:  None    New Prescriptions New Prescriptions   No medications on file     Francine Graven, DO 12/25/16 1750

## 2016-12-22 NOTE — ED Notes (Signed)
edp aware pt c/o pain and nausea.

## 2016-12-22 NOTE — ED Notes (Signed)
pt vomited small amount. Has only drank about 3/4 cup of contrast. Will notify edp.

## 2016-12-22 NOTE — ED Triage Notes (Signed)
Pt c/o severe abd cramping, n/v/d

## 2017-01-03 ENCOUNTER — Emergency Department (HOSPITAL_COMMUNITY): Payer: Medicaid Other

## 2017-01-03 ENCOUNTER — Inpatient Hospital Stay (HOSPITAL_COMMUNITY)
Admission: EM | Admit: 2017-01-03 | Discharge: 2017-01-05 | DRG: 195 | Disposition: A | Discharge status: Home / Self Care | Payer: Medicaid Other | Attending: Internal Medicine | Admitting: Internal Medicine

## 2017-01-03 ENCOUNTER — Encounter (HOSPITAL_COMMUNITY): Payer: Self-pay

## 2017-01-03 DIAGNOSIS — G629 Polyneuropathy, unspecified: Secondary | ICD-10-CM | POA: Diagnosis present

## 2017-01-03 DIAGNOSIS — F411 Generalized anxiety disorder: Secondary | ICD-10-CM | POA: Diagnosis not present

## 2017-01-03 DIAGNOSIS — F17210 Nicotine dependence, cigarettes, uncomplicated: Secondary | ICD-10-CM | POA: Diagnosis present

## 2017-01-03 DIAGNOSIS — J189 Pneumonia, unspecified organism: Secondary | ICD-10-CM | POA: Diagnosis present

## 2017-01-03 DIAGNOSIS — R112 Nausea with vomiting, unspecified: Secondary | ICD-10-CM | POA: Diagnosis not present

## 2017-01-03 DIAGNOSIS — J181 Lobar pneumonia, unspecified organism: Principal | ICD-10-CM

## 2017-01-03 DIAGNOSIS — E785 Hyperlipidemia, unspecified: Secondary | ICD-10-CM | POA: Diagnosis present

## 2017-01-03 DIAGNOSIS — K589 Irritable bowel syndrome without diarrhea: Secondary | ICD-10-CM | POA: Diagnosis present

## 2017-01-03 DIAGNOSIS — G8929 Other chronic pain: Secondary | ICD-10-CM | POA: Diagnosis present

## 2017-01-03 DIAGNOSIS — Z720 Tobacco use: Secondary | ICD-10-CM | POA: Diagnosis present

## 2017-01-03 DIAGNOSIS — E876 Hypokalemia: Secondary | ICD-10-CM | POA: Diagnosis present

## 2017-01-03 DIAGNOSIS — R1110 Vomiting, unspecified: Secondary | ICD-10-CM

## 2017-01-03 DIAGNOSIS — Z87442 Personal history of urinary calculi: Secondary | ICD-10-CM

## 2017-01-03 DIAGNOSIS — K219 Gastro-esophageal reflux disease without esophagitis: Secondary | ICD-10-CM | POA: Diagnosis present

## 2017-01-03 DIAGNOSIS — Z79899 Other long term (current) drug therapy: Secondary | ICD-10-CM

## 2017-01-03 LAB — CBC WITH DIFFERENTIAL/PLATELET
BASOS ABS: 0 10*3/uL (ref 0.0–0.1)
BASOS PCT: 0 %
Eosinophils Absolute: 0.3 10*3/uL (ref 0.0–0.7)
Eosinophils Relative: 2 %
HEMATOCRIT: 33.2 % — AB (ref 36.0–46.0)
Hemoglobin: 11.1 g/dL — ABNORMAL LOW (ref 12.0–15.0)
Lymphocytes Relative: 14 %
Lymphs Abs: 2.7 10*3/uL (ref 0.7–4.0)
MCH: 29.8 pg (ref 26.0–34.0)
MCHC: 33.4 g/dL (ref 30.0–36.0)
MCV: 89 fL (ref 78.0–100.0)
MONO ABS: 1.3 10*3/uL — AB (ref 0.1–1.0)
Monocytes Relative: 7 %
NEUTROS ABS: 14.6 10*3/uL — AB (ref 1.7–7.7)
NEUTROS PCT: 77 %
Platelets: 389 10*3/uL (ref 150–400)
RBC: 3.73 MIL/uL — ABNORMAL LOW (ref 3.87–5.11)
RDW: 15.7 % — AB (ref 11.5–15.5)
WBC: 18.8 10*3/uL — AB (ref 4.0–10.5)

## 2017-01-03 LAB — COMPREHENSIVE METABOLIC PANEL
ALBUMIN: 3.3 g/dL — AB (ref 3.5–5.0)
ALT: 12 U/L — ABNORMAL LOW (ref 14–54)
AST: 19 U/L (ref 15–41)
Alkaline Phosphatase: 75 U/L (ref 38–126)
Anion gap: 9 (ref 5–15)
BILIRUBIN TOTAL: 0.7 mg/dL (ref 0.3–1.2)
BUN: 5 mg/dL — AB (ref 6–20)
CHLORIDE: 99 mmol/L — AB (ref 101–111)
CO2: 28 mmol/L (ref 22–32)
Calcium: 8.4 mg/dL — ABNORMAL LOW (ref 8.9–10.3)
Creatinine, Ser: 0.74 mg/dL (ref 0.44–1.00)
GFR calc Af Amer: >60 mL/min (ref >60)
GFR calc non Af Amer: >60 mL/min (ref >60)
Glucose, Bld: 91 mg/dL (ref 65–99)
POTASSIUM: 3.7 mmol/L (ref 3.5–5.1)
Sodium: 136 mmol/L (ref 135–145)
TOTAL PROTEIN: 7.1 g/dL (ref 6.5–8.1)

## 2017-01-03 LAB — URINALYSIS, ROUTINE W REFLEX MICROSCOPIC
Bacteria, UA: NONE SEEN
Bilirubin Urine: NEGATIVE
Glucose, UA: NEGATIVE mg/dL
Ketones, ur: 20 mg/dL — AB
Leukocytes, UA: NEGATIVE
Nitrite: NEGATIVE
Protein, ur: NEGATIVE mg/dL
SPECIFIC GRAVITY, URINE: 1.015 (ref 1.005–1.030)
pH: 6 (ref 5.0–8.0)

## 2017-01-03 LAB — INFLUENZA PANEL BY PCR (TYPE A & B)
INFLAPCR: NEGATIVE
INFLBPCR: NEGATIVE

## 2017-01-03 LAB — PREGNANCY, URINE: PREG TEST UR: NEGATIVE

## 2017-01-03 LAB — LACTIC ACID, PLASMA: Lactic Acid, Venous: 0.6 mmol/L (ref 0.5–1.9)

## 2017-01-03 LAB — LIPASE, BLOOD: LIPASE: 12 U/L (ref 11–51)

## 2017-01-03 MED ORDER — ALBUTEROL SULFATE (2.5 MG/3ML) 0.083% IN NEBU: 3.0000 mL | INHALATION_SOLUTION | Freq: Four times a day (QID) | RESPIRATORY_TRACT | Status: DC | PRN

## 2017-01-03 MED ORDER — SODIUM CHLORIDE 0.9 % IV BOLUS (SEPSIS)
1000.0000 mL | Freq: Once | INTRAVENOUS | Status: AC
  Administered 2017-01-03: 1000 mL via INTRAVENOUS

## 2017-01-03 MED ORDER — GABAPENTIN 300 MG PO CAPS
300.0000 mg | ORAL_CAPSULE | Freq: Two times a day (BID) | ORAL | Status: DC
  Administered 2017-01-04 – 2017-01-05 (×3): 300 mg via ORAL
  Filled 2017-01-03 (×3): qty 1

## 2017-01-03 MED ORDER — OXYCODONE-ACETAMINOPHEN 5-325 MG PO TABS
1.5000 | ORAL_TABLET | Freq: Four times a day (QID) | ORAL | Status: DC | PRN
  Administered 2017-01-03: 2 via ORAL
  Administered 2017-01-04: 3 via ORAL
  Administered 2017-01-04: 2 via ORAL
  Administered 2017-01-04 – 2017-01-05 (×3): 3 via ORAL
  Filled 2017-01-03 (×4): qty 3
  Filled 2017-01-03 (×2): qty 2

## 2017-01-03 MED ORDER — SODIUM CHLORIDE 0.9 % IV SOLN
INTRAVENOUS | Status: AC
  Administered 2017-01-03: via INTRAVENOUS

## 2017-01-03 MED ORDER — SODIUM CHLORIDE 0.9 % IV SOLN
INTRAVENOUS | Status: DC
  Administered 2017-01-03: 16:00:00 via INTRAVENOUS

## 2017-01-03 MED ORDER — ONDANSETRON HCL 4 MG/2ML IJ SOLN
4.0000 mg | INTRAMUSCULAR | Status: AC | PRN
  Administered 2017-01-03 (×2): 4 mg via INTRAVENOUS
  Filled 2017-01-03 (×3): qty 2

## 2017-01-03 MED ORDER — PANTOPRAZOLE SODIUM 20 MG PO TBEC
20.0000 mg | DELAYED_RELEASE_TABLET | Freq: Two times a day (BID) | ORAL | Status: DC
  Filled 2017-01-03: qty 1

## 2017-01-03 MED ORDER — PROMETHAZINE HCL 25 MG/ML IJ SOLN
12.5000 mg | Freq: Four times a day (QID) | INTRAMUSCULAR | Status: DC | PRN
  Administered 2017-01-04 (×2): 12.5 mg via INTRAVENOUS
  Filled 2017-01-03 (×2): qty 1

## 2017-01-03 MED ORDER — MORPHINE SULFATE (PF) 4 MG/ML IV SOLN
4.0000 mg | INTRAVENOUS | Status: DC | PRN
  Administered 2017-01-03: 4 mg via INTRAVENOUS
  Filled 2017-01-03: qty 1

## 2017-01-03 MED ORDER — FAMOTIDINE IN NACL 20-0.9 MG/50ML-% IV SOLN
20.0000 mg | Freq: Once | INTRAVENOUS | Status: AC
Start: 2017-01-03 — End: 2017-01-03
  Administered 2017-01-03: 20 mg via INTRAVENOUS
  Filled 2017-01-03: qty 50

## 2017-01-03 MED ORDER — LEVOFLOXACIN IN D5W 750 MG/150ML IV SOLN
750.0000 mg | INTRAVENOUS | Status: DC
  Administered 2017-01-04: 750 mg via INTRAVENOUS
  Filled 2017-01-03: qty 150

## 2017-01-03 MED ORDER — LEVOFLOXACIN IN D5W 750 MG/150ML IV SOLN
750.0000 mg | Freq: Once | INTRAVENOUS | Status: AC
  Administered 2017-01-03: 750 mg via INTRAVENOUS
  Filled 2017-01-03: qty 150

## 2017-01-03 NOTE — ED Notes (Signed)
Pt ambulated around nurses station, sats 96-97% on RA

## 2017-01-03 NOTE — ED Provider Notes (Signed)
[facility] DEPT Provider Note   CSN: [id number] Arrival date & time: 01/03/17  1215     History   Chief Complaint Chief Complaint  Patient presents with  . Cough  . Emesis    HPI Julie [name] is a 46 y.o. female.  HPI  Pt was seen at 1455. Per pt, c/o gradual onset and persistence of constant "cough" for the past 1 week. Pt states she began to have N/V last night, as well as "some" diarrhea. Pt c/o upper abd "cramping" pain "from all the coughing." Endorses subjective home fevers/chills. Pt was evaluated in the ED 2 weeks ago for N/V/D, which pt states "got better, then I got sick again." Denies CP/SOB, no back pain, no black or blood in stools or emesis.   Past Medical History:  Diagnosis Date  . Anger   . Anxiety   . Aortic insufficiency 2009   Noted at heart cath  . Arthritis   . Asthma   . Back pain   . BV (bacterial vaginosis) 05/28/2015  . Chronic diarrhea   . Depression   . Diarrhea 05/28/2015  . Dyslipidemia 01/19/2016  . GERD (gastroesophageal reflux disease)   . History of kidney stones    history of  . History of nuclear stress test 07/15/2008   lexiscan; mild-mod perfusion defect in mid anteroseptal, apical anterior, apical septal regions (attenuation artifiact), prominent gut uptake activity in infero-apical region; post-stress EF 64%; EKG negative for ischemia; patient experienced CP during test  . Irritable bowel syndrome   . Nausea 05/28/2015  . Neck pain   . Numerous moles 01/18/2016  . RLQ abdominal pain 05/28/2015  . Seizures ([place])    2 years ago; unknown etiology and none since then and on no meds  . Vaginal discharge 05/28/2015  . Weight gain 01/18/2016    Patient Active Problem List   Diagnosis Date Noted  . Hx of colonic polyps 09/27/2016  . Vitamin D deficiency 01/19/2016  . Dyslipidemia 01/19/2016  . Numerous moles 01/18/2016  . Weight gain 01/18/2016  . Generalized anxiety disorder 08/27/2015  . Tobacco abuse 02/07/2014  .  Palpitations 12/26/2013  . Rectal bleeding 11/12/2013  . Hemorrhoids 11/12/2013  . Nausea alone 11/12/2013  . IBS (irritable bowel syndrome) 08/16/2012  . GERD (gastroesophageal reflux disease) 08/16/2012  . Arthritis 08/16/2012    Past Surgical History:  Procedure Laterality Date  . BIOPSY N/A 02/25/2014   Procedure: BIOPSY;  Surgeon: Rogene Houston, MD;  Location: [facility];  Service: Endoscopy;  Laterality: N/A;  . BREAST ENHANCEMENT SURGERY    . CARDIAC CATHETERIZATION  1025//2006   no significant CAD, mild-mod depressed LV systolic function, EF 123456, mild-mod AI (Dr. Gerrie Nordmann)   . CARPAL TUNNEL RELEASE Bilateral 01/01/2016  . CESAREAN SECTION    . COLONOSCOPY  05/27/2011   snare polypectomy   . COLONOSCOPY WITH PROPOFOL N/A 10/21/2016   Procedure: COLONOSCOPY WITH PROPOFOL;  Surgeon: Rogene Houston, MD;  Location: [facility];  Service: Endoscopy;  Laterality: N/A;  10:30  . ESOPHAGOGASTRODUODENOSCOPY N/A 02/25/2014   Procedure: ESOPHAGOGASTRODUODENOSCOPY (EGD);  Surgeon: Rogene Houston, MD;  Location: [facility];  Service: Endoscopy;  Laterality: N/A;  730  . KNEE SURGERY Right   . TRANSTHORACIC ECHOCARDIOGRAM  99991111   LV systolic function is low-normal; RV is normal in size & function; mild MR, mild TR  . TUBAL LIGATION      OB History    Gravida Para Term Preterm AB  Living   6 4     2 4    SAB TAB Ectopic Multiple Live Births   2               Home Medications    Prior to Admission medications   Medication Sig Start Date End Date Taking? Authorizing Provider  cetirizine (ZYRTEC) 10 MG tablet Take 10 mg by mouth daily. 02/19/15  Yes Historical Provider, MD  Cholecalciferol 5000 units capsule Take 1 capsule (5,000 Units total) by mouth daily. 01/19/16  Yes Estill Dooms, NP  fluticasone (FLONASE) 50 MCG/ACT nasal spray Place 1 spray into both nostrils daily as needed for allergies or rhinitis.   Yes Historical Provider, MD  gabapentin (NEURONTIN)  300 MG capsule Take 300 mg by mouth 2 (two) times daily.   Yes Historical Provider, MD  hydrocortisone (ANUSOL-HC) 2.5 % rectal cream Place 1 application rectally 2 (two) times daily. Patient taking differently: Place 1 application rectally as needed for hemorrhoids.  09/28/16  Yes Butch Penny, NP  hyoscyamine (LEVSIN SL) 0.125 MG SL tablet Place 1 tablet (0.125 mg total) under the tongue every 4 (four) hours as needed. 06/20/16  Yes Orpah Greek, MD  ibuprofen (ADVIL,MOTRIN) 800 MG tablet Take 800 mg by mouth every 8 (eight) hours as needed for mild pain.   Yes Historical Provider, MD  loperamide (IMODIUM A-D) 2 MG tablet Take 1 tablet (2 mg total) by mouth 4 (four) times daily as needed for diarrhea or loose stools. 12/22/16  Yes Fredia Sorrow, MD  ondansetron (ZOFRAN ODT) 4 MG disintegrating tablet Take 1 tablet (4 mg total) by mouth every 8 (eight) hours as needed. 12/22/16  Yes Fredia Sorrow, MD  oxyCODONE-acetaminophen (PERCOCET) 7.5-325 MG tablet Take 1-2 tablets by mouth every 6 (six) hours as needed for severe pain.   Yes Historical Provider, MD  pantoprazole (PROTONIX) 20 MG tablet Take 1 tablet (20 mg total) by mouth 2 (two) times daily before a meal. 09/28/16  Yes Butch Penny, NP  pravastatin (PRAVACHOL) 40 MG tablet Take 1 tablet (40 mg total) by mouth daily. 01/19/16  Yes Estill Dooms, NP  promethazine (PHENERGAN) 25 MG tablet Take 1 tablet (25 mg total) by mouth every 6 (six) hours as needed. 12/22/16  Yes Fredia Sorrow, MD  traMADol (ULTRAM) 50 MG tablet Take 50 mg by mouth every 12 (twelve) hours as needed for moderate pain.    Yes Historical Provider, MD  albuterol (PROVENTIL HFA;VENTOLIN HFA) 108 (90 BASE) MCG/ACT inhaler Inhale 1-2 puffs into the lungs every 6 (six) hours as needed for wheezing. Patient not taking: Reported on 12/22/2016 08/19/13   Milton Ferguson, MD  HYDROcodone-acetaminophen (NORCO/VICODIN) 5-325 MG tablet Take 1-2 tablets by mouth every 6 (six)  hours as needed. Patient not taking: Reported on 01/03/2017 12/22/16   Fredia Sorrow, MD    Family History Family History  Problem Relation Age of Onset  . Other Mother     heart problems  . Other Brother     neck and back surgery  . Other Maternal Grandmother     brain tumor  . Leukemia Maternal Grandfather     Social History Social History  Substance Use Topics  . Smoking status: Current Every Day Smoker    Packs/day: 0.50    Years: 28.00    Types: Cigarettes    Start date: 10/11/1986  . Smokeless tobacco: Never Used  . Alcohol use No     Comment: occasionally - beer; not  now     Allergies   Flagyl [metronidazole hcl] and Penicillins   Review of Systems Review of Systems ROS: Statement: All systems negative except as marked or noted in the HPI; Constitutional: +subjective fever and chills. ; ; Eyes: Negative for eye pain, redness and discharge. ; ; ENMT: Negative for ear pain, hoarseness, nasal congestion, sinus pressure and sore throat. ; ; Cardiovascular: Negative for chest pain, palpitations, diaphoresis, dyspnea and peripheral edema. ; ; Respiratory: +cough. Negative for wheezing and stridor. ; ; Gastrointestinal: +N/V/D, abd pain. Negative for blood in stool, hematemesis, jaundice and rectal bleeding. . ; ; Genitourinary: Negative for dysuria, flank pain and hematuria. ; ; Musculoskeletal: Negative for back pain and neck pain. Negative for swelling and trauma.; ; Skin: Negative for pruritus, rash, abrasions, blisters, bruising and skin lesion.; ; Neuro: Negative for headache, lightheadedness and neck stiffness. Negative for weakness, altered level of consciousness, altered mental status, extremity weakness, paresthesias, involuntary movement, seizure and syncope.      Physical Exam Updated Vital Signs BP 127/79   Pulse 98   Temp 98.2 F (36.8 C) (Oral)   Resp 20   Ht 5\' 7"  (1.702 m)   Wt 125 lb (56.7 kg)   LMP 11/23/2016 (LMP Unknown) Comment: irregular  SpO2  92%   BMI 19.58 kg/m   Patient Vitals for the past 24 hrs:  BP Temp Temp src Pulse Resp SpO2 Height Weight  01/03/17 1819 106/68 - - 99 - 96 % - -  01/03/17 1818 113/62 - - 86 - 97 % - -  01/03/17 1816 96/67 - - 83 - 96 % - -  01/03/17 1730 106/64 - - 90 - 97 % - -  01/03/17 1630 118/79 - - 88 - 95 % - -  01/03/17 1600 102/58 - - 84 - 94 % - -  01/03/17 1219 127/79 98.2 F (36.8 C) Oral 98 20 92 % 5\' 7"  (1.702 m) 125 lb (56.7 kg)    Physical Exam 1500: Physical examination:  Nursing notes reviewed; Vital signs and O2 SAT reviewed;  Constitutional: Well developed, Well nourished, Well hydrated, In no acute distress; Head:  Normocephalic, atraumatic; Eyes: EOMI, PERRL, No scleral icterus; ENMT: Mouth and pharynx normal, Mucous membranes moist; Neck: Supple, Full range of motion, No lymphadenopathy; Cardiovascular: Regular rate and rhythm, No gallop; Respiratory: Breath sounds clear & equal bilaterally, No wheezes.  Speaking full sentences with ease, Normal respiratory effort/excursion; Chest: Nontender, Movement normal; Abdomen: Soft, +mid-epigastric, LUQ tenderness to palp. No rebound or guarding. Nondistended, Normal bowel sounds; Genitourinary: No CVA tenderness; Extremities: Pulses normal, No tenderness, No edema, No calf edema or asymmetry.; Neuro: AA&Ox3, Major CN grossly intact.  Speech clear. No gross focal motor or sensory deficits in extremities.; Skin: Color normal, Warm, Dry.   ED Treatments / Results  Labs (all labs ordered are listed, but only abnormal results are displayed)   EKG  EKG Interpretation None       Radiology   Procedures Procedures (including critical care time)  Medications Ordered in ED Medications  sodium chloride 0.9 % bolus 1,000 mL (not administered)  0.9 %  sodium chloride infusion (not administered)  ondansetron (ZOFRAN) injection 4 mg (not administered)  famotidine (PEPCID) IVPB 20 mg premix (not administered)     Initial Impression /  Assessment and Plan / ED Course  I have reviewed the triage vital signs and the nursing notes.  Pertinent labs & imaging results that were available during my care of the  patient were reviewed by me and considered in my medical decision making (see chart for details).  MDM Reviewed: previous chart, nursing note and vitals Reviewed previous: labs Interpretation: labs and x-ray   Results for orders placed or performed during the hospital encounter of 01/03/17  CBC with Differential  Result Value Ref Range   WBC 18.8 (H) 4.0 - 10.5 K/uL   RBC 3.73 (L) 3.87 - 5.11 MIL/uL   Hemoglobin 11.1 (L) 12.0 - 15.0 g/dL   HCT 33.2 (L) 36.0 - 46.0 %   MCV 89.0 78.0 - 100.0 fL   MCH 29.8 26.0 - 34.0 pg   MCHC 33.4 30.0 - 36.0 g/dL   RDW 15.7 (H) 11.5 - 15.5 %   Platelets 389 150 - 400 K/uL   Neutrophils Relative % 77 %   Neutro Abs 14.6 (H) 1.7 - 7.7 K/uL   Lymphocytes Relative 14 %   Lymphs Abs 2.7 0.7 - 4.0 K/uL   Monocytes Relative 7 %   Monocytes Absolute 1.3 (H) 0.1 - 1.0 K/uL   Eosinophils Relative 2 %   Eosinophils Absolute 0.3 0.0 - 0.7 K/uL   Basophils Relative 0 %   Basophils Absolute 0.0 0.0 - 0.1 K/uL  Comprehensive metabolic panel  Result Value Ref Range   Sodium 136 135 - 145 mmol/L   Potassium 3.7 3.5 - 5.1 mmol/L   Chloride 99 (L) 101 - 111 mmol/L   CO2 28 22 - 32 mmol/L   Glucose, Bld 91 65 - 99 mg/dL   BUN 5 (L) 6 - 20 mg/dL   Creatinine, Ser 0.74 0.44 - 1.00 mg/dL   Calcium 8.4 (L) 8.9 - 10.3 mg/dL   Total Protein 7.1 6.5 - 8.1 g/dL   Albumin 3.3 (L) 3.5 - 5.0 g/dL   AST 19 15 - 41 U/L   ALT 12 (L) 14 - 54 U/L   Alkaline Phosphatase 75 38 - 126 U/L   Total Bilirubin 0.7 0.3 - 1.2 mg/dL   GFR calc non Af Amer >60 >60 mL/min   GFR calc Af Amer >60 >60 mL/min   Anion gap 9 5 - 15  Urinalysis, Routine w reflex microscopic  Result Value Ref Range   Color, Urine YELLOW YELLOW   APPearance HAZY (A) CLEAR   Specific Gravity, Urine 1.015 1.005 - 1.030   pH 6.0 5.0 -  8.0   Glucose, UA NEGATIVE NEGATIVE mg/dL   Hgb urine dipstick MODERATE (A) NEGATIVE   Bilirubin Urine NEGATIVE NEGATIVE   Ketones, ur 20 (A) NEGATIVE mg/dL   Protein, ur NEGATIVE NEGATIVE mg/dL   Nitrite NEGATIVE NEGATIVE   Leukocytes, UA NEGATIVE NEGATIVE   RBC / HPF 6-30 0 - 5 RBC/hpf   WBC, UA 0-5 0 - 5 WBC/hpf   Bacteria, UA NONE SEEN NONE SEEN  Pregnancy, urine  Result Value Ref Range   Preg Test, Ur NEGATIVE NEGATIVE  Lipase, blood  Result Value Ref Range   Lipase 12 11 - 51 U/L   Dg Chest 2 View Result Date: 01/03/2017 CLINICAL DATA:  Cough for 1 week. Vomiting and shortness of breath since last night. EXAM: CHEST  2 VIEW COMPARISON:  09/10/2015 FINDINGS: Acute bilateral airspace disease greatest at the bases. No edema, effusion, or pneumothorax. No visible cavitation. Normal heart size and mediastinal contours. IMPRESSION: Bilateral pneumonia. Electronically Signed   By: Monte Fantasia M.D.   On: 01/03/2017 12:46   Dg Abd 2 Views Result Date: 01/03/2017 CLINICAL DATA:  Vomiting beginning last  night.  Cough for 1 week. EXAM: ABDOMEN - 2 VIEW COMPARISON:  CT abdomen and pelvis 12/22/2016. FINDINGS: No free intraperitoneal air is identified. The bowel gas pattern is nonobstructive. Bilateral lower lobe airspace disease is noted. IMPRESSION: Benign-appearing abdomen. Bilateral lower lobe airspace disease consistent with pneumonia. Electronically Signed   By: Inge Rise M.D.   On: 01/03/2017 15:39    1915:  Pt unable to tol PO without N/V. Will dose IV abx for CAP, observation admit.  Dx and testing d/w pt.  Questions answered.  Verb understanding, agreeable to admit. T/C to [facility] Dr. Shanon Brow, case discussed, including:  HPI, pertinent PM/SHx, VS/PE, dx testing, ED course and treatment:  Agreeable to admit, requests to write temporary orders, obtain medical bed to team [facility].   Final Clinical Impressions(s) / ED Diagnoses   Final diagnoses:  Vomiting    New  Prescriptions New Prescriptions   No medications on file     Francine Graven, DO 01/07/17 1323

## 2017-01-03 NOTE — H&P (Signed)
History and Physical    Julie [name] [id number] DOB: Sep 29, 1971 DOA: 01/03/2017  PCP: [facility]  Patient coming from: home  Chief Complaint:  N/v/coughing  HPI: Julie [name] is a 46 y.o. female with medical history significant of anxiety, GERD comes in with 2 days of coughing a lot and vomiting.  She overall does not feel well.  She reports she had an illness over a week ago she thinks was the flu as her grandkids had it she improved back to normal then the last 2 days she has started coughing a lot, with a lot of nausea and vomiting.  No diarrhea.  Subjective fevers at home.  Pt found to have pna.  She has not been on recent abx.  Pt vomiting in ED and cannot tolerate po, so referred for iv antibiotics overnight.  Review of Systems: As per HPI otherwise 10 point review of systems negative.   Past Medical History:  Diagnosis Date  . Anger   . Anxiety   . Aortic insufficiency 2009   Noted at heart cath  . Arthritis   . Asthma   . Back pain   . BV (bacterial vaginosis) 05/28/2015  . Chronic diarrhea   . Depression   . Diarrhea 05/28/2015  . Dyslipidemia 01/19/2016  . GERD (gastroesophageal reflux disease)   . History of kidney stones    history of  . History of nuclear stress test 07/15/2008   lexiscan; mild-mod perfusion defect in mid anteroseptal, apical anterior, apical septal regions (attenuation artifiact), prominent gut uptake activity in infero-apical region; post-stress EF 64%; EKG negative for ischemia; patient experienced CP during test  . Irritable bowel syndrome   . Nausea 05/28/2015  . Neck pain   . Numerous moles 01/18/2016  . RLQ abdominal pain 05/28/2015  . Seizures ([place])    2 years ago; unknown etiology and none since then and on no meds  . Vaginal discharge 05/28/2015  . Weight gain 01/18/2016    Past Surgical History:  Procedure Laterality Date  . BIOPSY N/A 02/25/2014   Procedure: BIOPSY;  Surgeon: Rogene Houston, MD;   [facility];  Service: Endoscopy;  Laterality: N/A;  . BREAST ENHANCEMENT SURGERY    . CARDIAC CATHETERIZATION  1025//2006   no significant CAD, mild-mod depressed LV systolic function, EF 123456, mild-mod AI (Dr. Gerrie Nordmann)   . CARPAL TUNNEL RELEASE Bilateral 01/01/2016  . CESAREAN SECTION    . COLONOSCOPY  05/27/2011   snare polypectomy   . COLONOSCOPY WITH PROPOFOL N/A 10/21/2016   Procedure: COLONOSCOPY WITH PROPOFOL;  Surgeon: Rogene Houston, MD;  [facility];  Service: Endoscopy;  Laterality: N/A;  10:30  . ESOPHAGOGASTRODUODENOSCOPY N/A 02/25/2014   Procedure: ESOPHAGOGASTRODUODENOSCOPY (EGD);  Surgeon: Rogene Houston, MD;  [facility];  Service: Endoscopy;  Laterality: N/A;  730  . KNEE SURGERY Right   . TRANSTHORACIC ECHOCARDIOGRAM  99991111   LV systolic function is low-normal; RV is normal in size & function; mild MR, mild TR  . TUBAL LIGATION       reports that she has been smoking Cigarettes.  She started smoking about 30 years ago. She has a 14.00 pack-year smoking history. She has never used smokeless tobacco. She reports that she does not drink alcohol or use drugs.  Allergies  Allergen Reactions  . Flagyl [Metronidazole Hcl] Nausea And Vomiting  . Penicillins Other (See Comments)    Caused patient to pass out Has patient  had a PCN reaction causing immediate rash, facial/tongue/throat swelling, SOB or lightheadedness with hypotension: unknown Has patient had a PCN reaction causing severe rash involving mucus membranes or skin necrosis: No Has patient had a PCN reaction that required hospitalization No Has patient had a PCN reaction occurring within the last 10 years: No If all of the above answers are "NO", then may proceed with Cephalosporin use.      Family History  Problem Relation Age of Onset  . Other Mother     heart problems  . Other Brother     neck and back surgery  . Other Maternal Grandmother     brain tumor  .  Leukemia Maternal Grandfather     Prior to Admission medications   Medication Sig Start Date End Date Taking? Authorizing Provider  cetirizine (ZYRTEC) 10 MG tablet Take 10 mg by mouth daily. 02/19/15  Yes Historical Provider, MD  Cholecalciferol 5000 units capsule Take 1 capsule (5,000 Units total) by mouth daily. 01/19/16  Yes Estill Dooms, NP  fluticasone (FLONASE) 50 MCG/ACT nasal spray Place 1 spray into both nostrils daily as needed for allergies or rhinitis.   Yes Historical Provider, MD  gabapentin (NEURONTIN) 300 MG capsule Take 300 mg by mouth 2 (two) times daily.   Yes Historical Provider, MD  hydrocortisone (ANUSOL-HC) 2.5 % rectal cream Place 1 application rectally 2 (two) times daily. Patient taking differently: Place 1 application rectally as needed for hemorrhoids.  09/28/16  Yes Butch Penny, NP  hyoscyamine (LEVSIN SL) 0.125 MG SL tablet Place 1 tablet (0.125 mg total) under the tongue every 4 (four) hours as needed. 06/20/16  Yes Orpah Greek, MD  ibuprofen (ADVIL,MOTRIN) 800 MG tablet Take 800 mg by mouth every 8 (eight) hours as needed for mild pain.   Yes Historical Provider, MD  loperamide (IMODIUM A-D) 2 MG tablet Take 1 tablet (2 mg total) by mouth 4 (four) times daily as needed for diarrhea or loose stools. 12/22/16  Yes Fredia Sorrow, MD  ondansetron (ZOFRAN ODT) 4 MG disintegrating tablet Take 1 tablet (4 mg total) by mouth every 8 (eight) hours as needed. 12/22/16  Yes Fredia Sorrow, MD  oxyCODONE-acetaminophen (PERCOCET) 7.5-325 MG tablet Take 1-2 tablets by mouth every 6 (six) hours as needed for severe pain.   Yes Historical Provider, MD  pantoprazole (PROTONIX) 20 MG tablet Take 1 tablet (20 mg total) by mouth 2 (two) times daily before a meal. 09/28/16  Yes Butch Penny, NP  pravastatin (PRAVACHOL) 40 MG tablet Take 1 tablet (40 mg total) by mouth daily. 01/19/16  Yes Estill Dooms, NP  promethazine (PHENERGAN) 25 MG tablet Take 1 tablet (25  mg total) by mouth every 6 (six) hours as needed. 12/22/16  Yes Fredia Sorrow, MD  traMADol (ULTRAM) 50 MG tablet Take 50 mg by mouth every 12 (twelve) hours as needed for moderate pain.    Yes Historical Provider, MD  albuterol (PROVENTIL HFA;VENTOLIN HFA) 108 (90 BASE) MCG/ACT inhaler Inhale 1-2 puffs into the lungs every 6 (six) hours as needed for wheezing. Patient not taking: Reported on 12/22/2016 08/19/13   Milton Ferguson, MD  HYDROcodone-acetaminophen (NORCO/VICODIN) 5-325 MG tablet Take 1-2 tablets by mouth every 6 (six) hours as needed. Patient not taking: Reported on 01/03/2017 12/22/16   Fredia Sorrow, MD    Physical Exam: Vitals:   01/03/17 1730 01/03/17 1816 01/03/17 1818 01/03/17 1819  BP: 106/64 96/67 113/62 106/68  Pulse: 90 83 86 99  Resp:  Temp:      TempSrc:      SpO2: 97% 96% 97% 96%  Weight:      Height:        Constitutional: NAD, calm, comfortable Vitals:   01/03/17 1730 01/03/17 1816 01/03/17 1818 01/03/17 1819  BP: 106/64 96/67 113/62 106/68  Pulse: 90 83 86 99  Resp:      Temp:      TempSrc:      SpO2: 97% 96% 97% 96%  Weight:      Height:       Eyes: PERRL, lids and conjunctivae normal ENMT: Mucous membranes are moist. Posterior pharynx clear of any exudate or lesions.Normal dentition.  Neck: normal, supple, no masses, no thyromegaly Respiratory: clear to auscultation bilaterally, no wheezing, no crackles. Normal respiratory effort. No accessory muscle use.  Cardiovascular: Regular rate and rhythm, no murmurs / rubs / gallops. No extremity edema. 2+ pedal pulses. No carotid bruits.  Abdomen: no tenderness, no masses palpated. No hepatosplenomegaly. Bowel sounds positive.  Musculoskeletal: no clubbing / cyanosis. No joint deformity upper and lower extremities. Good ROM, no contractures. Normal muscle tone.  Skin: no rashes, lesions, ulcers. No induration Neurologic: CN 2-12 grossly intact. Sensation intact, DTR normal. Strength 5/5 in all 4.    Psychiatric: Normal judgment and insight. Alert and oriented x 3. Normal mood.    Labs on Admission: I have personally reviewed following labs and imaging studies  CBC:  Recent Labs Lab 01/03/17 1323  WBC 18.8*  NEUTROABS 14.6*  HGB 11.1*  HCT 33.2*  MCV 89.0  PLT [id number]   Basic Metabolic Panel:  Recent Labs Lab 01/03/17 1323  NA 136  K 3.7  CL 99*  CO2 28  GLUCOSE 91  BUN 5*  CREATININE 0.74  CALCIUM 8.4*   GFR: Estimated Creatinine Clearance: 77 mL/min (by C-G formula based on SCr of 0.74 mg/dL). Liver Function Tests:  Recent Labs Lab 01/03/17 1323  AST 19  ALT 12*  ALKPHOS 75  BILITOT 0.7  PROT 7.1  ALBUMIN 3.3*    Recent Labs Lab 01/03/17 1510  LIPASE 12    Urine analysis:    Component Value Date/Time   COLORURINE YELLOW 01/03/2017 1222   APPEARANCEUR HAZY (A) 01/03/2017 1222   LABSPEC 1.015 01/03/2017 1222   PHURINE 6.0 01/03/2017 1222   GLUCOSEU NEGATIVE 01/03/2017 1222   HGBUR MODERATE (A) 01/03/2017 1222   BILIRUBINUR NEGATIVE 01/03/2017 1222   KETONESUR 20 (A) 01/03/2017 1222   PROTEINUR NEGATIVE 01/03/2017 1222   UROBILINOGEN 0.2 06/04/2015 1258   NITRITE NEGATIVE 01/03/2017 1222   LEUKOCYTESUR NEGATIVE 01/03/2017 1222   Radiological Exams on Admission: Dg Chest 2 View  Result Date: 01/03/2017 CLINICAL DATA:  Cough for 1 week. Vomiting and shortness of breath since last night. EXAM: CHEST  2 VIEW COMPARISON:  09/10/2015 FINDINGS: Acute bilateral airspace disease greatest at the bases. No edema, effusion, or pneumothorax. No visible cavitation. Normal heart size and mediastinal contours. IMPRESSION: Bilateral pneumonia. Electronically Signed   By: Monte Fantasia M.D.   On: 01/03/2017 12:46   Dg Abd 2 Views  Result Date: 01/03/2017 CLINICAL DATA:  Vomiting beginning last night.  Cough for 1 week. EXAM: ABDOMEN - 2 VIEW COMPARISON:  CT abdomen and pelvis 12/22/2016. FINDINGS: No free intraperitoneal air is identified. The bowel gas  pattern is nonobstructive. Bilateral lower lobe airspace disease is noted. IMPRESSION: Benign-appearing abdomen. Bilateral lower lobe airspace disease consistent with pneumonia. Electronically Signed   By: Inge Rise M.D.  On: 01/03/2017 15:39   cxr reviewed bilateral lower infiltrates   Assessment/Plan 46 yo female with CAP unable to take oral medication  Principal Problem:   PNA (pneumonia)- levaquin iv overnight.  Lactate normal.    Active Problems:   IBS (irritable bowel syndrome)- noted   GERD (gastroesophageal reflux disease)- cont protonix   Generalized anxiety disorder- noted   Intractable nausea and vomiting- zofran ordered.  Adv diet as tolerates   DVT prophylaxis: scds  Code Status:  full Family Communication: none  Disposition Plan:  Per day team Consults called:  none Admission status:  observation   Yosmar Ryker A MD [facility]  If 7PM-7AM, please contact night-coverage [email or web address] Password [email or web address]  01/03/2017, 7:59 PM

## 2017-01-03 NOTE — ED Notes (Signed)
After drinking small amount of sprite, patient is heaving and states she is nauseated again

## 2017-01-03 NOTE — ED Triage Notes (Signed)
Pt reports has had cough x 1 weeks.  Reports started vomiting with coughing last night.  Also reports " a little diarrhea."  Pt had gastroenteritis recently.

## 2017-01-04 DIAGNOSIS — Z87442 Personal history of urinary calculi: Secondary | ICD-10-CM | POA: Diagnosis not present

## 2017-01-04 DIAGNOSIS — K219 Gastro-esophageal reflux disease without esophagitis: Secondary | ICD-10-CM

## 2017-01-04 DIAGNOSIS — K588 Other irritable bowel syndrome: Secondary | ICD-10-CM | POA: Diagnosis not present

## 2017-01-04 DIAGNOSIS — Z720 Tobacco use: Secondary | ICD-10-CM | POA: Diagnosis not present

## 2017-01-04 DIAGNOSIS — J181 Lobar pneumonia, unspecified organism: Principal | ICD-10-CM

## 2017-01-04 DIAGNOSIS — F17210 Nicotine dependence, cigarettes, uncomplicated: Secondary | ICD-10-CM | POA: Diagnosis present

## 2017-01-04 DIAGNOSIS — R1110 Vomiting, unspecified: Secondary | ICD-10-CM

## 2017-01-04 DIAGNOSIS — G629 Polyneuropathy, unspecified: Secondary | ICD-10-CM | POA: Diagnosis present

## 2017-01-04 DIAGNOSIS — Z79899 Other long term (current) drug therapy: Secondary | ICD-10-CM | POA: Diagnosis not present

## 2017-01-04 DIAGNOSIS — E785 Hyperlipidemia, unspecified: Secondary | ICD-10-CM | POA: Diagnosis present

## 2017-01-04 DIAGNOSIS — R112 Nausea with vomiting, unspecified: Secondary | ICD-10-CM

## 2017-01-04 DIAGNOSIS — E876 Hypokalemia: Secondary | ICD-10-CM | POA: Diagnosis present

## 2017-01-04 DIAGNOSIS — K589 Irritable bowel syndrome without diarrhea: Secondary | ICD-10-CM | POA: Diagnosis present

## 2017-01-04 DIAGNOSIS — G8929 Other chronic pain: Secondary | ICD-10-CM | POA: Diagnosis present

## 2017-01-04 DIAGNOSIS — F411 Generalized anxiety disorder: Secondary | ICD-10-CM | POA: Diagnosis present

## 2017-01-04 LAB — CBC WITH DIFFERENTIAL/PLATELET
BASOS ABS: 0 10*3/uL (ref 0.0–0.1)
BASOS PCT: 0 %
Eosinophils Absolute: 0.4 10*3/uL (ref 0.0–0.7)
Eosinophils Relative: 4 %
HCT: 30.8 % — ABNORMAL LOW (ref 36.0–46.0)
Hemoglobin: 9.6 g/dL — ABNORMAL LOW (ref 12.0–15.0)
Lymphocytes Relative: 33 %
Lymphs Abs: 3.2 10*3/uL (ref 0.7–4.0)
MCH: 28.2 pg (ref 26.0–34.0)
MCHC: 31.2 g/dL (ref 30.0–36.0)
MCV: 90.6 fL (ref 78.0–100.0)
MONO ABS: 0.9 10*3/uL (ref 0.1–1.0)
Monocytes Relative: 9 %
NEUTROS ABS: 5.1 10*3/uL (ref 1.7–7.7)
NEUTROS PCT: 54 %
Platelets: 355 10*3/uL (ref 150–400)
RBC: 3.4 MIL/uL — AB (ref 3.87–5.11)
RDW: 15.5 % (ref 11.5–15.5)
WBC: 9.5 10*3/uL (ref 4.0–10.5)

## 2017-01-04 LAB — BASIC METABOLIC PANEL
ANION GAP: 8 (ref 5–15)
BUN: 5 mg/dL — ABNORMAL LOW (ref 6–20)
CALCIUM: 8 mg/dL — AB (ref 8.9–10.3)
CO2: 25 mmol/L (ref 22–32)
Chloride: 102 mmol/L (ref 101–111)
Creatinine, Ser: 0.59 mg/dL (ref 0.44–1.00)
GLUCOSE: 75 mg/dL (ref 65–99)
Potassium: 3.2 mmol/L — ABNORMAL LOW (ref 3.5–5.1)
SODIUM: 135 mmol/L (ref 135–145)

## 2017-01-04 LAB — STREP PNEUMONIAE URINARY ANTIGEN: STREP PNEUMO URINARY ANTIGEN: NEGATIVE

## 2017-01-04 MED ORDER — BOOST / RESOURCE BREEZE PO LIQD
1.0000 | Freq: Three times a day (TID) | ORAL | Status: DC
  Administered 2017-01-04: 1 via ORAL

## 2017-01-04 MED ORDER — POTASSIUM CHLORIDE CRYS ER 20 MEQ PO TBCR
40.0000 meq | EXTENDED_RELEASE_TABLET | Freq: Once | ORAL | Status: AC
  Administered 2017-01-04: 40 meq via ORAL
  Filled 2017-01-04: qty 2

## 2017-01-04 MED ORDER — PANTOPRAZOLE SODIUM 40 MG PO TBEC
40.0000 mg | DELAYED_RELEASE_TABLET | Freq: Two times a day (BID) | ORAL | Status: DC
  Administered 2017-01-04 – 2017-01-05 (×3): 40 mg via ORAL
  Filled 2017-01-04 (×3): qty 1

## 2017-01-04 MED ORDER — ONDANSETRON HCL 4 MG/2ML IJ SOLN
4.0000 mg | Freq: Four times a day (QID) | INTRAMUSCULAR | Status: DC | PRN
  Administered 2017-01-04: 4 mg via INTRAVENOUS
  Filled 2017-01-04: qty 2

## 2017-01-04 MED ORDER — NICOTINE 7 MG/24HR TD PT24
7.0000 mg | MEDICATED_PATCH | Freq: Every day | TRANSDERMAL | Status: DC
  Administered 2017-01-04: 7 mg via TRANSDERMAL
  Filled 2017-01-04: qty 1

## 2017-01-04 NOTE — Discharge Summary (Signed)
Physician Discharge Summary  Julie [name] [id number] DOB: 1971/03/16 DOA: 01/03/2017  PCP: [facility] date: 01/03/2017 Discharge date: 01/05/17  Admitted From:Home Disposition:  Home   Recommendations for Outpatient Follow-up:  1. Follow up with PCP in 1-2 weeks 2. Please obtain BMP/CBC in one week   Home Health:no Equipment/Devices:N/A  Discharge Condition: Stable CODE STATUS:FULL Diet recommendation: Regular   Brief/Interim Summary: 46 year old female with a history of anxiety, hyperlipidemia, depression, GERD, IBS, tobacco use presented withone-week history 1 week history of persistent cough with subjective fevers and chills and loose stools. Her symptoms initially improved, but worsened again in the past 2 days prior to admission. On 12/22/2016, the patient was evaluated in the emergency department for vomiting and diarrhea with crampy abdominal pain. CT of the abdomen and pelvis at that time revealed mild segmental gas distention of the small bowel consistent with mild ileus percent right is. There was liquid stool in the right colon at that time. The patient was discharged from the emergency department in stable condition. She states that she had been in contact with her grandchildren approximately 2-3 weeks prior to this admission. She stated that one of her grandchildren had influenza and the other one had RSV. In the emergency department, the patient did have vomiting and could not tolerate a by mouth challenge. As a result, admission was recommended. Chest x-ray in the emergency department showed bibasilar opacities, and CBC showed WBC 18.8.  Discharge Diagnoses:  Lobar pneumonia -Influenza PCR negative -WBC improving with levofloxacin--18.9-->8.8 -Stable on room air -Lactic acid 0.6 -Blood cultures negative at time of d/c -home with levofloxacin 750 mg daily x 3 more days to complete 5 days of tx  Intractable nausea and  vomiting -Overall improved -Tolerating diet  Anxiety -The patient states that she takes Celexa and Ativan; however, these were not on her medication reconciliation -I have asked the patient to bring the bottles from home--her family was not able to bring the bottles  -she needs to follow up with PCP  Peripheral neuropathy/Chronic pain -Continue gabapentin -Pt asked for additional "pain medicine" for her headaches--I informed pt it is our policy not to refill any opioids for chronic pain--she needs to follow up with primary care provider  GERD -Continue Protonix  Hypokalemia -repleted -check mag--1.8  Tobacco abuse -Tobacco cessation discussed   Discharge Instructions  Discharge Instructions    Diet - low sodium heart healthy    Complete by:  As directed    Increase activity slowly    Complete by:  As directed      Allergies as of 01/05/2017      Reactions   Flagyl [metronidazole Hcl] Nausea And Vomiting   Penicillins Other (See Comments)   Caused patient to pass out Has patient had a PCN reaction causing immediate rash, facial/tongue/throat swelling, SOB or lightheadedness with hypotension: unknown Has patient had a PCN reaction causing severe rash involving mucus membranes or skin necrosis: No Has patient had a PCN reaction that required hospitalization No Has patient had a PCN reaction occurring within the last 10 years: No If all of the above answers are "NO", then may proceed with Cephalosporin use.      Medication List    STOP taking these medications   albuterol 108 (90 Base) MCG/ACT inhaler Commonly known as:  PROVENTIL HFA;VENTOLIN HFA   HYDROcodone-acetaminophen 5-325 MG tablet Commonly known as:  NORCO/VICODIN     TAKE these medications   cetirizine 10 MG tablet Commonly  known as:  ZYRTEC Take 10 mg by mouth daily.   Cholecalciferol 5000 units capsule Take 1 capsule (5,000 Units total) by mouth daily.   fluticasone 50 MCG/ACT nasal  spray Commonly known as:  FLONASE Place 1 spray into both nostrils daily as needed for allergies or rhinitis.   gabapentin 300 MG capsule Commonly known as:  NEURONTIN Take 300 mg by mouth 2 (two) times daily.   hydrocortisone 2.5 % rectal cream Commonly known as:  ANUSOL-HC Place 1 application rectally 2 (two) times daily. What changed:  when to take this  reasons to take this   hyoscyamine 0.125 MG SL tablet Commonly known as:  LEVSIN SL Place 1 tablet (0.125 mg total) under the tongue every 4 (four) hours as needed.   ibuprofen 800 MG tablet Commonly known as:  ADVIL,MOTRIN Take 800 mg by mouth every 8 (eight) hours as needed for mild pain.   levofloxacin 750 MG tablet Commonly known as:  LEVAQUIN Take 1 tablet (750 mg total) by mouth daily.   loperamide 2 MG tablet Commonly known as:  IMODIUM A-D Take 1 tablet (2 mg total) by mouth 4 (four) times daily as needed for diarrhea or loose stools.   ondansetron 4 MG disintegrating tablet Commonly known as:  ZOFRAN ODT Take 1 tablet (4 mg total) by mouth every 8 (eight) hours as needed.   oxyCODONE-acetaminophen 7.5-325 MG tablet Commonly known as:  PERCOCET Take 1-2 tablets by mouth every 6 (six) hours as needed for severe pain.   pantoprazole 20 MG tablet Commonly known as:  PROTONIX Take 1 tablet (20 mg total) by mouth 2 (two) times daily before a meal.   pravastatin 40 MG tablet Commonly known as:  PRAVACHOL Take 1 tablet (40 mg total) by mouth daily.   promethazine 25 MG tablet Commonly known as:  PHENERGAN Take 1 tablet (25 mg total) by mouth every 6 (six) hours as needed.   traMADol 50 MG tablet Commonly known as:  ULTRAM Take 50 mg by mouth every 12 (twelve) hours as needed for moderate pain.       Allergies  Allergen Reactions  . Flagyl [Metronidazole Hcl] Nausea And Vomiting  . Penicillins Other (See Comments)    Caused patient to pass out Has patient had a PCN reaction causing immediate rash,  facial/tongue/throat swelling, SOB or lightheadedness with hypotension: unknown Has patient had a PCN reaction causing severe rash involving mucus membranes or skin necrosis: No Has patient had a PCN reaction that required hospitalization No Has patient had a PCN reaction occurring within the last 10 years: No If all of the above answers are "NO", then may proceed with Cephalosporin use.      Consultations:  none   Procedures/Studies: Dg Chest 2 View  Result Date: 01/03/2017 CLINICAL DATA:  Cough for 1 week. Vomiting and shortness of breath since last night. EXAM: CHEST  2 VIEW COMPARISON:  09/10/2015 FINDINGS: Acute bilateral airspace disease greatest at the bases. No edema, effusion, or pneumothorax. No visible cavitation. Normal heart size and mediastinal contours. IMPRESSION: Bilateral pneumonia. Electronically Signed   By: Monte Fantasia M.D.   On: 01/03/2017 12:46   Ct Abdomen Pelvis W Contrast  Result Date: 12/22/2016 CLINICAL DATA:  Generalized abdominal pain, nausea and vomiting starting this morning, history of inflammatory bowel disease EXAM: CT ABDOMEN AND PELVIS WITH CONTRAST TECHNIQUE: Multidetector CT imaging of the abdomen and pelvis was performed using the standard protocol following bolus administration of intravenous contrast. CONTRAST:  168mL ISOVUE-300  IOPAMIDOL (ISOVUE-300) INJECTION 61% COMPARISON:  06/20/2016 FINDINGS: Lower chest: Lung bases shows no acute findings. Hepatobiliary: No focal liver abnormality is seen. No gallstones, gallbladder wall thickening, or biliary dilatation. Pancreas: Unremarkable. No pancreatic ductal dilatation or surrounding inflammatory changes. Spleen: Normal in size without focal abnormality. Adrenals/Urinary Tract: No adrenal gland mass. Enhanced kidneys are symmetrical in size. No hydronephrosis or hydroureter. The urinary bladder is unremarkable. Delayed renal images shows bilateral renal symmetrical excretion. Bilateral visualized  proximal ureter is unremarkable. Stomach/Bowel: There is no small bowel obstruction. Mild gaseous distended small bowel loop in left lower abdomen suspicious for mild ileus or enteritis. There is no definite evidence of small bowel obstruction. Some liquid stool noted in right colon and proximal transverse colon suspicious for diarrhea. No definite evidence of pericolonic inflammation. No evidence of acute colitis or diverticulitis. A normal appendix noted in coronal image 58. Vascular/Lymphatic: No aortic aneurysm.  No adenopathy. Reproductive: Minimal retroverted uterus. There is hyperemic endometrium. Please correlate with phase of menstrual cycle or endometritis. Post tubal ligation surgical clips are noted. Other: No adnexal mass. No ascites or free abdominal air. No inguinal adenopathy. Musculoskeletal: Mild degenerative changes lower thoracic and lumbar spine. Alignment and vertebral body heights are preserved. IMPRESSION: 1. Mild segmental gaseous distension of small bowel in left lower abdomen please see axial image 50 and 45. Findings suspicious for mild ileus or enteritis. No definite evidence of small bowel obstruction. 2. There is some liquid stool within right colon proximal transverse colon. Diarrhea cannot be excluded. 3. No pericecal inflammation. Normal appendix partially visualized axial image 53. 4. Minimal retroflexed uterus. Mild hyperemic endometrium. Please correlate with phase of menstrual cycle or inflammation/endometritis. Post tubal ligation surgical clips are noted. No adnexal mass. 5. No hydronephrosis or hydroureter. Electronically Signed   By: Lahoma Crocker M.D.   On: 12/22/2016 13:08   Dg Abd 2 Views  Result Date: 01/03/2017 CLINICAL DATA:  Vomiting beginning last night.  Cough for 1 week. EXAM: ABDOMEN - 2 VIEW COMPARISON:  CT abdomen and pelvis 12/22/2016. FINDINGS: No free intraperitoneal air is identified. The bowel gas pattern is nonobstructive. Bilateral lower lobe airspace  disease is noted. IMPRESSION: Benign-appearing abdomen. Bilateral lower lobe airspace disease consistent with pneumonia. Electronically Signed   By: Inge Rise M.D.   On: 01/03/2017 15:39        Discharge Exam: Vitals:   01/04/17 2149 01/05/17 0617  BP: (!) 90/54 (!) 98/53  Pulse: 77 74  Resp: 18 18  Temp: 97.2 F (36.2 C) 97.5 F (36.4 C)   Vitals:   01/04/17 1445 01/04/17 2143 01/04/17 2149 01/05/17 0617  BP: (!) 87/49  (!) 90/54 (!) 98/53  Pulse: (!) 55  77 74  Resp: 18  18 18   Temp: 98.2 F (36.8 C)  97.2 F (36.2 C) 97.5 F (36.4 C)  TempSrc:   Oral Oral  SpO2: 100% 97% 98% 100%  Weight:      Height:        General: Pt is alert, awake, not in acute distress Cardiovascular: RRR, S1/S2 +, no rubs, no gallops Respiratory: bibasilar rales, no wheezing, no rhonchi Abdominal: Soft, NT, ND, bowel sounds + Extremities: no edema, no cyanosis   The results of significant diagnostics from this hospitalization (including imaging, microbiology, ancillary and laboratory) are listed below for reference.    Significant Diagnostic Studies: Dg Chest 2 View  Result Date: 01/03/2017 CLINICAL DATA:  Cough for 1 week. Vomiting and shortness of breath since last night.  EXAM: CHEST  2 VIEW COMPARISON:  09/10/2015 FINDINGS: Acute bilateral airspace disease greatest at the bases. No edema, effusion, or pneumothorax. No visible cavitation. Normal heart size and mediastinal contours. IMPRESSION: Bilateral pneumonia. Electronically Signed   By: Monte Fantasia M.D.   On: 01/03/2017 12:46   Ct Abdomen Pelvis W Contrast  Result Date: 12/22/2016 CLINICAL DATA:  Generalized abdominal pain, nausea and vomiting starting this morning, history of inflammatory bowel disease EXAM: CT ABDOMEN AND PELVIS WITH CONTRAST TECHNIQUE: Multidetector CT imaging of the abdomen and pelvis was performed using the standard protocol following bolus administration of intravenous contrast. CONTRAST:  140mL  ISOVUE-300 IOPAMIDOL (ISOVUE-300) INJECTION 61% COMPARISON:  06/20/2016 FINDINGS: Lower chest: Lung bases shows no acute findings. Hepatobiliary: No focal liver abnormality is seen. No gallstones, gallbladder wall thickening, or biliary dilatation. Pancreas: Unremarkable. No pancreatic ductal dilatation or surrounding inflammatory changes. Spleen: Normal in size without focal abnormality. Adrenals/Urinary Tract: No adrenal gland mass. Enhanced kidneys are symmetrical in size. No hydronephrosis or hydroureter. The urinary bladder is unremarkable. Delayed renal images shows bilateral renal symmetrical excretion. Bilateral visualized proximal ureter is unremarkable. Stomach/Bowel: There is no small bowel obstruction. Mild gaseous distended small bowel loop in left lower abdomen suspicious for mild ileus or enteritis. There is no definite evidence of small bowel obstruction. Some liquid stool noted in right colon and proximal transverse colon suspicious for diarrhea. No definite evidence of pericolonic inflammation. No evidence of acute colitis or diverticulitis. A normal appendix noted in coronal image 58. Vascular/Lymphatic: No aortic aneurysm.  No adenopathy. Reproductive: Minimal retroverted uterus. There is hyperemic endometrium. Please correlate with phase of menstrual cycle or endometritis. Post tubal ligation surgical clips are noted. Other: No adnexal mass. No ascites or free abdominal air. No inguinal adenopathy. Musculoskeletal: Mild degenerative changes lower thoracic and lumbar spine. Alignment and vertebral body heights are preserved. IMPRESSION: 1. Mild segmental gaseous distension of small bowel in left lower abdomen please see axial image 50 and 45. Findings suspicious for mild ileus or enteritis. No definite evidence of small bowel obstruction. 2. There is some liquid stool within right colon proximal transverse colon. Diarrhea cannot be excluded. 3. No pericecal inflammation. Normal appendix partially  visualized axial image 53. 4. Minimal retroflexed uterus. Mild hyperemic endometrium. Please correlate with phase of menstrual cycle or inflammation/endometritis. Post tubal ligation surgical clips are noted. No adnexal mass. 5. No hydronephrosis or hydroureter. Electronically Signed   By: Lahoma Crocker M.D.   On: 12/22/2016 13:08   Dg Abd 2 Views  Result Date: 01/03/2017 CLINICAL DATA:  Vomiting beginning last night.  Cough for 1 week. EXAM: ABDOMEN - 2 VIEW COMPARISON:  CT abdomen and pelvis 12/22/2016. FINDINGS: No free intraperitoneal air is identified. The bowel gas pattern is nonobstructive. Bilateral lower lobe airspace disease is noted. IMPRESSION: Benign-appearing abdomen. Bilateral lower lobe airspace disease consistent with pneumonia. Electronically Signed   By: Inge Rise M.D.   On: 01/03/2017 15:39     Microbiology: Recent Results (from the past 240 hour(s))  Culture, blood (routine x 2) Call MD if unable to obtain prior to antibiotics being given     Status: None (Preliminary result)   Collection Time: 01/03/17 10:15 PM  Result Value Ref Range Status   Specimen Description BLOOD LEFT ANTECUBITAL  Final   Special Requests BOTTLES DRAWN AEROBIC AND ANAEROBIC 10CC EACH  Final   Culture NO GROWTH 2 DAYS  Final   Report Status PENDING  Incomplete  Culture, blood (routine x 2) Call  MD if unable to obtain prior to antibiotics being given     Status: None (Preliminary result)   Collection Time: 01/03/17 10:30 PM  Result Value Ref Range Status   Specimen Description BLOOD RIGHT HAND  Final   Special Requests BOTTLES DRAWN AEROBIC AND ANAEROBIC 10CC EACH  Final   Culture NO GROWTH 2 DAYS  Final   Report Status PENDING  Incomplete     Labs: Basic Metabolic Panel:  Recent Labs Lab 01/03/17 1323 01/04/17 0442 01/05/17 0607  NA 136 135 136  K 3.7 3.2* 3.7  CL 99* 102 103  CO2 28 25 26   GLUCOSE 91 75 133*  BUN 5* 5* <5*  CREATININE 0.74 0.59 0.68  CALCIUM 8.4* 8.0* 8.2*    MG  --   --  1.8   Liver Function Tests:  Recent Labs Lab 01/03/17 1323  AST 19  ALT 12*  ALKPHOS 75  BILITOT 0.7  PROT 7.1  ALBUMIN 3.3*    Recent Labs Lab 01/03/17 1510  LIPASE 12   No results for input(s): AMMONIA in the last 168 hours. CBC:  Recent Labs Lab 01/03/17 1323 01/04/17 0442 01/05/17 0607  WBC 18.8* 9.5 8.8  NEUTROABS 14.6* 5.1  --   HGB 11.1* 9.6* 10.0*  HCT 33.2* 30.8* 30.9*  MCV 89.0 90.6 90.1  PLT 389 355 377   Cardiac Enzymes: No results for input(s): CKTOTAL, CKMB, CKMBINDEX, TROPONINI in the last 168 hours. BNP: Invalid input(s): POCBNP CBG: No results for input(s): GLUCAP in the last 168 hours.  Time coordinating discharge:  Greater than 30 minutes  Signed:  Dylyn Mclaren, DO [facility] Pager: [phone] 01/05/2017, 8:36 AM

## 2017-01-04 NOTE — Progress Notes (Signed)
PROGRESS NOTE  Julie [name] [id number] DOB: [birth year] DOA: 01/03/2017 PCP: [facility]  Brief History:  46 year old female with a history of anxiety, hyperlipidemia, depression, GERD, IBS, tobacco use presented withone-week history 1 week history of persistent cough with subjective fevers and chills and loose stools. Her symptoms initially improved, but worsened again in the past 2 days prior to admission. On 12/22/2016, the patient was evaluated in the emergency department for vomiting and diarrhea with crampy abdominal pain. CT of the abdomen and pelvis at that time revealed mild segmental gas distention of the small bowel consistent with mild ileus percent right is. There was liquid stool in the right colon at that time. The patient was discharged from the emergency department in stable condition. She states that she had been in contact with her grandchildren approximately 2-3 weeks prior to this admission. She stated that one of her grandchildren had influenza and the other one had RSV. In the emergency department, the patient did have vomiting and could not tolerate a by mouth challenge. As a result, admission was recommended. Chest x-ray in the emergency department showed bibasilar opacities, and CBC showed WBC 18.8.  Assessment/Plan: Lobar pneumonia -Influenza PCR negative -WBC improving with levofloxacin -Stable on room air -Lactic acid 0.6 -Blood cultures negative  Intractable nausea and vomiting -Overall improved -Tolerating diet  Anxiety -The patient states that she takes Celexa and Ativan; however, these were not on her medication reconciliation -I have asked the patient to bring the bottles from home  Peripheral neuropathy -Continue gabapentin  GERD -Continue Protonix  Hypokalemia -replete -check mag  Tobacco abuse -Tobacco cessation discussed    Disposition Plan:   Home 2/1 if stable Family Communication:   Mother  updated at bedside 1/31  Consultants:  none  Code Status:  FULL   DVT Prophylaxis:  SCDs   Procedures: As Listed in Progress Note Above  Antibiotics: Levoflox 1/30>>    Subjective: Patient denies fevers, chills, headache, chest pain, dyspnea, nausea, vomiting, diarrhea, abdominal pain, dysuria, hematuria, hematochezia, and melena.   Objective: Vitals:   01/03/17 2244 01/03/17 2346 01/04/17 0530 01/04/17 1445  BP: 104/66 (!) 92/46 (!) 100/49 (!) 87/49  Pulse: 73 78 63 (!) 55  Resp: 17 16 18 18   Temp:  97.6 F (36.4 C) 98.2 F (36.8 C) 98.2 F (36.8 C)  TempSrc:  Oral Oral   SpO2: 97% 99% 99% 100%  Weight:  58.6 kg (129 lb 3.2 oz)    Height:  5\' 1"  (1.549 m)      Intake/Output Summary (Last 24 hours) at 01/04/17 1741 Last data filed at 01/04/17 1300  Gross per 24 hour  Intake           328.75 ml  Output                0 ml  Net           328.75 ml   Weight change:  Exam:   General:  Pt is alert, follows commands appropriately, not in acute distress  HEENT: No icterus, No thrush, No neck mass, Cecilia/AT  Cardiovascular: RRR, S1/S2, no rubs, no gallops  Respiratory: Bibasilar rales. Good air movement. No wheezing.  Abdomen: Soft/+BS, non tender, non distended, no guarding  Extremities: No edema, No lymphangitis, No petechiae, No rashes, no synovitis   Data Reviewed: I have personally reviewed following labs and imaging studies Basic Metabolic Panel:  Recent Labs Lab  01/03/17 1323 01/04/17 0442  NA 136 135  K 3.7 3.2*  CL 99* 102  CO2 28 25  GLUCOSE 91 75  BUN 5* 5*  CREATININE 0.74 0.59  CALCIUM 8.4* 8.0*   Liver Function Tests:  Recent Labs Lab 01/03/17 1323  AST 19  ALT 12*  ALKPHOS 75  BILITOT 0.7  PROT 7.1  ALBUMIN 3.3*    Recent Labs Lab 01/03/17 1510  LIPASE 12   No results for input(s): AMMONIA in the last 168 hours. Coagulation Profile: No results for input(s): INR, PROTIME in the last 168 hours. CBC:  Recent  Labs Lab 01/03/17 1323 01/04/17 0442  WBC 18.8* 9.5  NEUTROABS 14.6* 5.1  HGB 11.1* 9.6*  HCT 33.2* 30.8*  MCV 89.0 90.6  PLT 389 355   Cardiac Enzymes: No results for input(s): CKTOTAL, CKMB, CKMBINDEX, TROPONINI in the last 168 hours. BNP: Invalid input(s): POCBNP CBG: No results for input(s): GLUCAP in the last 168 hours. HbA1C: No results for input(s): HGBA1C in the last 72 hours. Urine analysis:    Component Value Date/Time   COLORURINE YELLOW 01/03/2017 1222   APPEARANCEUR HAZY (A) 01/03/2017 1222   LABSPEC 1.015 01/03/2017 1222   PHURINE 6.0 01/03/2017 1222   GLUCOSEU NEGATIVE 01/03/2017 1222   HGBUR MODERATE (A) 01/03/2017 1222   BILIRUBINUR NEGATIVE 01/03/2017 1222   KETONESUR 20 (A) 01/03/2017 1222   PROTEINUR NEGATIVE 01/03/2017 1222   UROBILINOGEN 0.2 06/04/2015 1258   NITRITE NEGATIVE 01/03/2017 1222   LEUKOCYTESUR NEGATIVE 01/03/2017 1222   Sepsis Labs: @LABRCNTIP (procalcitonin:4,lacticidven:4) ) Recent Results (from the past 240 hour(s))  Culture, blood (routine x 2) Call MD if unable to obtain prior to antibiotics being given     Status: None (Preliminary result)   Collection Time: 01/03/17 10:15 PM  Result Value Ref Range Status   Specimen Description BLOOD LEFT ANTECUBITAL  Final   Special Requests BOTTLES DRAWN AEROBIC AND ANAEROBIC 10CC EACH  Final   Culture NO GROWTH < 12 HOURS  Final   Report Status PENDING  Incomplete  Culture, blood (routine x 2) Call MD if unable to obtain prior to antibiotics being given     Status: None (Preliminary result)   Collection Time: 01/03/17 10:30 PM  Result Value Ref Range Status   Specimen Description BLOOD RIGHT HAND  Final   Special Requests BOTTLES DRAWN AEROBIC AND ANAEROBIC 10CC EACH  Final   Culture NO GROWTH < 12 HOURS  Final   Report Status PENDING  Incomplete     Scheduled Meds: . feeding supplement  1 Container Oral TID BM  . gabapentin  300 mg Oral BID  . levofloxacin (LEVAQUIN) IV  750 mg  Intravenous Q24H  . pantoprazole  40 mg Oral BID AC   Continuous Infusions:  Procedures/Studies: Dg Chest 2 View  Result Date: 01/03/2017 CLINICAL DATA:  Cough for 1 week. Vomiting and shortness of breath since last night. EXAM: CHEST  2 VIEW COMPARISON:  09/10/2015 FINDINGS: Acute bilateral airspace disease greatest at the bases. No edema, effusion, or pneumothorax. No visible cavitation. Normal heart size and mediastinal contours. IMPRESSION: Bilateral pneumonia. Electronically Signed   By: Monte Fantasia M.D.   On: 01/03/2017 12:46   Ct Abdomen Pelvis W Contrast  Result Date: 12/22/2016 CLINICAL DATA:  Generalized abdominal pain, nausea and vomiting starting this morning, history of inflammatory bowel disease EXAM: CT ABDOMEN AND PELVIS WITH CONTRAST TECHNIQUE: Multidetector CT imaging of the abdomen and pelvis was performed using the standard protocol following bolus  administration of intravenous contrast. CONTRAST:  163mL ISOVUE-300 IOPAMIDOL (ISOVUE-300) INJECTION 61% COMPARISON:  06/20/2016 FINDINGS: Lower chest: Lung bases shows no acute findings. Hepatobiliary: No focal liver abnormality is seen. No gallstones, gallbladder wall thickening, or biliary dilatation. Pancreas: Unremarkable. No pancreatic ductal dilatation or surrounding inflammatory changes. Spleen: Normal in size without focal abnormality. Adrenals/Urinary Tract: No adrenal gland mass. Enhanced kidneys are symmetrical in size. No hydronephrosis or hydroureter. The urinary bladder is unremarkable. Delayed renal images shows bilateral renal symmetrical excretion. Bilateral visualized proximal ureter is unremarkable. Stomach/Bowel: There is no small bowel obstruction. Mild gaseous distended small bowel loop in left lower abdomen suspicious for mild ileus or enteritis. There is no definite evidence of small bowel obstruction. Some liquid stool noted in right colon and proximal transverse colon suspicious for diarrhea. No definite  evidence of pericolonic inflammation. No evidence of acute colitis or diverticulitis. A normal appendix noted in coronal image 58. Vascular/Lymphatic: No aortic aneurysm.  No adenopathy. Reproductive: Minimal retroverted uterus. There is hyperemic endometrium. Please correlate with phase of menstrual cycle or endometritis. Post tubal ligation surgical clips are noted. Other: No adnexal mass. No ascites or free abdominal air. No inguinal adenopathy. Musculoskeletal: Mild degenerative changes lower thoracic and lumbar spine. Alignment and vertebral body heights are preserved. IMPRESSION: 1. Mild segmental gaseous distension of small bowel in left lower abdomen please see axial image 50 and 45. Findings suspicious for mild ileus or enteritis. No definite evidence of small bowel obstruction. 2. There is some liquid stool within right colon proximal transverse colon. Diarrhea cannot be excluded. 3. No pericecal inflammation. Normal appendix partially visualized axial image 53. 4. Minimal retroflexed uterus. Mild hyperemic endometrium. Please correlate with phase of menstrual cycle or inflammation/endometritis. Post tubal ligation surgical clips are noted. No adnexal mass. 5. No hydronephrosis or hydroureter. Electronically Signed   By: Lahoma Crocker M.D.   On: 12/22/2016 13:08   Dg Abd 2 Views  Result Date: 01/03/2017 CLINICAL DATA:  Vomiting beginning last night.  Cough for 1 week. EXAM: ABDOMEN - 2 VIEW COMPARISON:  CT abdomen and pelvis 12/22/2016. FINDINGS: No free intraperitoneal air is identified. The bowel gas pattern is nonobstructive. Bilateral lower lobe airspace disease is noted. IMPRESSION: Benign-appearing abdomen. Bilateral lower lobe airspace disease consistent with pneumonia. Electronically Signed   By: Inge Rise M.D.   On: 01/03/2017 15:39    Karolynn Infantino, DO  [facility] Pager [phone]  If 7PM-7AM, please contact night-coverage [email or web address] Password [email or web address] 01/04/2017, 5:41  PM   LOS: 0 days

## 2017-01-04 NOTE — Progress Notes (Signed)
Initial Nutrition Assessment    INTERVENTION:  Boost Breeze po BID, each supplement provides 250 kcal and 9 grams of protein   Regular diet   NUTRITION DIAGNOSIS:   Inadequate oral intake related to acute illness as evidenced by per patient/family report.   GOAL:   Patient will meet greater than or equal to 90% of their needs   MONITOR:   PO intake, Supplement acceptance, Weight trends, Labs  REASON FOR ASSESSMENT:   Malnutrition Screening Tool    ASSESSMENT:  Patient is a 46 yo who has a hx of multiple myloma, DM, GERD, IBS, Kidney stones and CHF. She presents with PNA and has not felt well for the past 2 weeks.   Her oral intake has been very poor due to nausea and vomiting. Patient says she has been under a lot of extra stress at home including a "breakup" with her boyfriend.  Usually she follows a regular diet at home and is able to shop and prepare her own food. Her meals are usually late breakfast or "brunch", snack mid afternoon and then meal for dinner. Likes to snack on fruit when she's feeling well.   Her weight is down but not significantly. Her weight range is 59-62 kg.    Nutrition-Focused physical exam findings: WDL   Recent Labs Lab 01/03/17 1323 01/04/17 0442  NA 136 135  K 3.7 3.2*  CL 99* 102  CO2 28 25  BUN 5* 5*  CREATININE 0.74 0.59  CALCIUM 8.4* 8.0*  GLUCOSE 91 75   Meds and  Labs: reviewed  Diet Order:  Diet regular Room service appropriate? Yes; Fluid consistency: Thin  Skin:  Reviewed, no issues  Last BM:  1/29   Height:   Ht Readings from Last 1 Encounters:  01/03/17 5\' 1"  (1.549 m)    Weight:   Wt Readings from Last 1 Encounters:  01/03/17 129 lb 3.2 oz (58.6 kg)    Ideal Body Weight:  48 kg  BMI:  Body mass index is 24.41 kg/m.  Estimated Nutritional Needs:   Kcal:  1700-1800  Protein:  70-75 gr  Fluid:  1.8 liters daily   EDUCATION NEEDS:   No education needs identified at this time  Colman Cater  MS,RD,CSG,LDN Office: [phone] Pager: [phone]

## 2017-01-05 DIAGNOSIS — K588 Other irritable bowel syndrome: Secondary | ICD-10-CM

## 2017-01-05 LAB — MAGNESIUM: MAGNESIUM: 1.8 mg/dL (ref 1.7–2.4)

## 2017-01-05 LAB — BASIC METABOLIC PANEL WITH GFR
Anion gap: 7 (ref 5–15)
BUN: <5 mg/dL — ABNORMAL LOW (ref 6–20)
CO2: 26 mmol/L (ref 22–32)
Calcium: 8.2 mg/dL — ABNORMAL LOW (ref 8.9–10.3)
Chloride: 103 mmol/L (ref 101–111)
Creatinine, Ser: 0.68 mg/dL (ref 0.44–1.00)
GFR calc Af Amer: >60 mL/min
GFR calc non Af Amer: >60 mL/min
Glucose, Bld: 133 mg/dL — ABNORMAL HIGH (ref 65–99)
Potassium: 3.7 mmol/L (ref 3.5–5.1)
Sodium: 136 mmol/L (ref 135–145)

## 2017-01-05 LAB — CBC
HCT: 30.9 % — ABNORMAL LOW (ref 36.0–46.0)
Hemoglobin: 10 g/dL — ABNORMAL LOW (ref 12.0–15.0)
MCH: 29.2 pg (ref 26.0–34.0)
MCHC: 32.4 g/dL (ref 30.0–36.0)
MCV: 90.1 fL (ref 78.0–100.0)
PLATELETS: 377 10*3/uL (ref 150–400)
RBC: 3.43 MIL/uL — AB (ref 3.87–5.11)
RDW: 15.8 % — AB (ref 11.5–15.5)
WBC: 8.8 10*3/uL (ref 4.0–10.5)

## 2017-01-05 LAB — HIV ANTIBODY (ROUTINE TESTING W REFLEX): HIV Screen 4th Generation wRfx: NONREACTIVE

## 2017-01-05 MED ORDER — LEVOFLOXACIN 750 MG PO TABS: 750.0000 mg | ORAL_TABLET | ORAL | Status: DC

## 2017-01-05 MED ORDER — LEVOFLOXACIN 750 MG PO TABS: 750.0000 mg | ORAL_TABLET | Freq: Every day | ORAL | 0 refills | Status: DC

## 2017-01-05 NOTE — Progress Notes (Signed)
Patient discharged with instructions, prescription, and care notes.  Verbalized understanding via teach back.  IV was removed and the site was WNL. Patient voiced no further complaints or concerns at the time of discharge.  Appointments scheduled per instructions.  Patient left the floor via w/c family  And staff in stable condition. 

## 2017-01-08 LAB — CULTURE, BLOOD (ROUTINE X 2)
Culture: NO GROWTH
Culture: NO GROWTH

## 2017-01-14 ENCOUNTER — Other Ambulatory Visit (INDEPENDENT_AMBULATORY_CARE_PROVIDER_SITE_OTHER): Payer: Self-pay | Admitting: Internal Medicine

## 2017-01-14 DIAGNOSIS — K219 Gastro-esophageal reflux disease without esophagitis: Secondary | ICD-10-CM

## 2017-02-28 ENCOUNTER — Other Ambulatory Visit: Payer: Self-pay | Admitting: Adult Health

## 2017-04-04 ENCOUNTER — Other Ambulatory Visit (INDEPENDENT_AMBULATORY_CARE_PROVIDER_SITE_OTHER): Payer: Self-pay | Admitting: Internal Medicine

## 2017-04-04 DIAGNOSIS — K219 Gastro-esophageal reflux disease without esophagitis: Secondary | ICD-10-CM

## 2017-07-04 ENCOUNTER — Ambulatory Visit: Payer: Self-pay | Admitting: Family Medicine

## 2017-07-05 ENCOUNTER — Telehealth: Payer: Self-pay

## 2017-07-05 ENCOUNTER — Ambulatory Visit: Payer: Self-pay | Admitting: Family Medicine
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
## 2017-07-31 ENCOUNTER — Ambulatory Visit (INDEPENDENT_AMBULATORY_CARE_PROVIDER_SITE_OTHER): Payer: Medicaid Other | Admitting: Adult Health

## 2017-07-31 ENCOUNTER — Encounter: Payer: Self-pay | Admitting: Adult Health

## 2017-07-31 VITALS — BP 100/70 | HR 78 | Ht 61.0 in | Wt 120.0 lb

## 2017-07-31 DIAGNOSIS — Z3202 Encounter for pregnancy test, result negative: Secondary | ICD-10-CM | POA: Insufficient documentation

## 2017-07-31 DIAGNOSIS — Z113 Encounter for screening for infections with a predominantly sexual mode of transmission: Secondary | ICD-10-CM | POA: Insufficient documentation

## 2017-07-31 DIAGNOSIS — F32A Depression, unspecified: Secondary | ICD-10-CM

## 2017-07-31 DIAGNOSIS — R232 Flushing: Secondary | ICD-10-CM | POA: Insufficient documentation

## 2017-07-31 DIAGNOSIS — E559 Vitamin D deficiency, unspecified: Secondary | ICD-10-CM

## 2017-07-31 DIAGNOSIS — R0989 Other specified symptoms and signs involving the circulatory and respiratory systems: Secondary | ICD-10-CM | POA: Insufficient documentation

## 2017-07-31 DIAGNOSIS — E7800 Pure hypercholesterolemia, unspecified: Secondary | ICD-10-CM | POA: Diagnosis not present

## 2017-07-31 DIAGNOSIS — R109 Unspecified abdominal pain: Secondary | ICD-10-CM | POA: Diagnosis not present

## 2017-07-31 DIAGNOSIS — N926 Irregular menstruation, unspecified: Secondary | ICD-10-CM

## 2017-07-31 DIAGNOSIS — R5383 Other fatigue: Secondary | ICD-10-CM | POA: Insufficient documentation

## 2017-07-31 DIAGNOSIS — Z131 Encounter for screening for diabetes mellitus: Secondary | ICD-10-CM

## 2017-07-31 DIAGNOSIS — F329 Major depressive disorder, single episode, unspecified: Secondary | ICD-10-CM | POA: Diagnosis not present

## 2017-07-31 DIAGNOSIS — F3341 Major depressive disorder, recurrent, in partial remission: Secondary | ICD-10-CM | POA: Insufficient documentation

## 2017-07-31 DIAGNOSIS — Z780 Asymptomatic menopausal state: Secondary | ICD-10-CM | POA: Insufficient documentation

## 2017-07-31 LAB — POCT URINE PREGNANCY: Preg Test, Ur: NEGATIVE

## 2017-07-31 MED ORDER — CETIRIZINE HCL 10 MG PO CAPS: ORAL_CAPSULE | ORAL | 12 refills | Status: DC

## 2017-07-31 MED ORDER — CITALOPRAM HYDROBROMIDE 40 MG PO TABS: 40.0000 mg | ORAL_TABLET | Freq: Every day | ORAL | 6 refills | Status: DC

## 2017-07-31 NOTE — Patient Instructions (Addendum)
Physical in 1 week then yearly

## 2017-07-31 NOTE — Progress Notes (Signed)
Subjective:     Patient ID: Julie [name], female   DOB: 12/04/1971, 46 y.o.   MRN: [id number]  HPI Julie [name] is a 46 year old white female in today complaining of no period in almost 4 months and cramps. No PCP at present she says, had missed appts and was dismissed.   Review of Systems No period in almost 4 months +hot flashes +cramps +tired Not sleeping well  +mood changes  Decrease appetite,with weight loss  Reviewed past medical,surgical, social and family history. Reviewed medications and allergies.     Objective:   Physical Exam BP 100/70 (BP Location: Left Arm, Patient Position: Sitting, Cuff Size: Small)   Pulse 78   Ht 5\' 1"  (1.549 m)   Wt 120 lb (54.4 kg)   LMP 04/06/2017   BMI 22.67 kg/m UPT negative, Skin warm and dry.Pelvic: external genitalia is normal in appearance no lesions, vagina: pink with good color, and moisture and ruage,urethra has no lesions or masses noted, cervix:smooth and bulbous, uterus: normal size, shape and contour, non tender, no masses felt, adnexa: no masses or tenderness noted. Bladder is non tender and no masses felt.    PHQ 9 score 25, is on celexa but has been with out for a week or so, denies being suicidal.  Discussed could be menopausal, will check labs.  Assessment:     1. Missed periods   2. Abdominal cramps   3. Hot flashes   4. Elevated cholesterol   5. Depression, unspecified depression type   6. Vitamin D deficiency   7. Tired   8. Screening for diabetes mellitus   9. Pregnancy examination or test, negative result       Plan:     Check CBC,CMP,TSH and lipids,A1c,vitamin D and FSH Return in 1 week for physical and discuss labs Rx celexa 40 mg take 1 daily with 6 refills Refilled zyrtec 10 mg #30 take 1 daily with 12 refills

## 2017-08-02 DIAGNOSIS — M1711 Unilateral primary osteoarthritis, right knee: Secondary | ICD-10-CM | POA: Insufficient documentation

## 2017-08-03 ENCOUNTER — Encounter (INDEPENDENT_AMBULATORY_CARE_PROVIDER_SITE_OTHER): Payer: Self-pay | Admitting: Internal Medicine

## 2017-08-09 ENCOUNTER — Ambulatory Visit (INDEPENDENT_AMBULATORY_CARE_PROVIDER_SITE_OTHER): Payer: Medicaid Other | Admitting: Adult Health

## 2017-08-09 ENCOUNTER — Encounter: Payer: Self-pay | Admitting: Adult Health

## 2017-08-09 VITALS — BP 122/74 | HR 100 | Ht 60.0 in | Wt 124.0 lb

## 2017-08-09 DIAGNOSIS — F329 Major depressive disorder, single episode, unspecified: Secondary | ICD-10-CM | POA: Diagnosis not present

## 2017-08-09 DIAGNOSIS — Z1211 Encounter for screening for malignant neoplasm of colon: Secondary | ICD-10-CM | POA: Diagnosis not present

## 2017-08-09 DIAGNOSIS — Z01411 Encounter for gynecological examination (general) (routine) with abnormal findings: Secondary | ICD-10-CM | POA: Diagnosis not present

## 2017-08-09 DIAGNOSIS — R232 Flushing: Secondary | ICD-10-CM

## 2017-08-09 DIAGNOSIS — N926 Irregular menstruation, unspecified: Secondary | ICD-10-CM | POA: Diagnosis not present

## 2017-08-09 DIAGNOSIS — Z780 Asymptomatic menopausal state: Secondary | ICD-10-CM | POA: Diagnosis not present

## 2017-08-09 DIAGNOSIS — Z01419 Encounter for gynecological examination (general) (routine) without abnormal findings: Secondary | ICD-10-CM

## 2017-08-09 DIAGNOSIS — Z0001 Encounter for general adult medical examination with abnormal findings: Secondary | ICD-10-CM | POA: Diagnosis not present

## 2017-08-09 DIAGNOSIS — F32A Depression, unspecified: Secondary | ICD-10-CM

## 2017-08-09 DIAGNOSIS — Z79890 Hormone replacement therapy (postmenopausal): Secondary | ICD-10-CM | POA: Diagnosis not present

## 2017-08-09 DIAGNOSIS — Z1212 Encounter for screening for malignant neoplasm of rectum: Secondary | ICD-10-CM

## 2017-08-09 DIAGNOSIS — E559 Vitamin D deficiency, unspecified: Secondary | ICD-10-CM

## 2017-08-09 LAB — CBC
HEMATOCRIT: 35.8 % (ref 34.0–46.6)
Hemoglobin: 11.4 g/dL (ref 11.1–15.9)
MCH: 29.5 pg (ref 26.6–33.0)
MCHC: 31.8 g/dL (ref 31.5–35.7)
MCV: 93 fL (ref 79–97)
PLATELETS: 364 10*3/uL (ref 150–379)
RBC: 3.87 x10E6/uL (ref 3.77–5.28)
RDW: 16.1 % — AB (ref 12.3–15.4)
WBC: 11.5 10*3/uL — ABNORMAL HIGH (ref 3.4–10.8)

## 2017-08-09 LAB — COMPREHENSIVE METABOLIC PANEL
A/G RATIO: 1.5 (ref 1.2–2.2)
ALT: 9 IU/L (ref 0–32)
AST: 16 IU/L (ref 0–40)
Albumin: 4.4 g/dL (ref 3.5–5.5)
Alkaline Phosphatase: 94 IU/L (ref 39–117)
BILIRUBIN TOTAL: 0.2 mg/dL (ref 0.0–1.2)
BUN/Creatinine Ratio: 11 (ref 9–23)
BUN: 8 mg/dL (ref 6–24)
CHLORIDE: 98 mmol/L (ref 96–106)
CO2: 26 mmol/L (ref 20–29)
Calcium: 9 mg/dL (ref 8.7–10.2)
Creatinine, Ser: 0.7 mg/dL (ref 0.57–1.00)
GFR, EST AFRICAN AMERICAN: 118 mL/min/{1.73_m2} (ref >59)
GFR, EST NON AFRICAN AMERICAN: 103 mL/min/{1.73_m2} (ref >59)
GLOBULIN, TOTAL: 3 g/dL (ref 1.5–4.5)
Glucose: 68 mg/dL (ref 65–99)
POTASSIUM: 4.4 mmol/L (ref 3.5–5.2)
SODIUM: 137 mmol/L (ref 134–144)
Total Protein: 7.4 g/dL (ref 6.0–8.5)

## 2017-08-09 LAB — HEMOCCULT GUIAC POC 1CARD (OFFICE): Fecal Occult Blood, POC: NEGATIVE

## 2017-08-09 LAB — LIPID PANEL
CHOL/HDL RATIO: 4.7 ratio — AB (ref 0.0–4.4)
CHOLESTEROL TOTAL: 189 mg/dL (ref 100–199)
HDL: 40 mg/dL (ref >39)
LDL CALC: 100 mg/dL — AB (ref 0–99)
TRIGLYCERIDES: 244 mg/dL — AB (ref 0–149)
VLDL Cholesterol Cal: 49 mg/dL — ABNORMAL HIGH (ref 5–40)

## 2017-08-09 LAB — VITAMIN D 25 HYDROXY (VIT D DEFICIENCY, FRACTURES): Vit D, 25-Hydroxy: 18.3 ng/mL — ABNORMAL LOW (ref 30.0–100.0)

## 2017-08-09 LAB — TSH: TSH: 0.814 u[IU]/mL (ref 0.450–4.500)

## 2017-08-09 LAB — HEMOGLOBIN A1C
ESTIMATED AVERAGE GLUCOSE: 108 mg/dL
HEMOGLOBIN A1C: 5.4 % (ref 4.8–5.6)

## 2017-08-09 LAB — FOLLICLE STIMULATING HORMONE: FSH: 34.5 m[IU]/mL

## 2017-08-09 MED ORDER — CHOLECALCIFEROL 125 MCG (5000 UT) PO CAPS: 5000.0000 [IU] | ORAL_CAPSULE | Freq: Every day | ORAL | Status: DC

## 2017-08-09 MED ORDER — ESTRADIOL 1 MG PO TABS: 1.0000 mg | ORAL_TABLET | Freq: Every day | ORAL | 12 refills | Status: DC

## 2017-08-09 MED ORDER — BUTALBITAL-APAP-CAFFEINE 50-325-40 MG PO TABS: 1.0000 | ORAL_TABLET | Freq: Four times a day (QID) | ORAL | 1 refills | Status: DC | PRN

## 2017-08-09 MED ORDER — PROGESTERONE MICRONIZED 200 MG PO CAPS: 200.0000 mg | ORAL_CAPSULE | Freq: Every day | ORAL | 12 refills | Status: DC

## 2017-08-09 NOTE — Patient Instructions (Signed)
Menopause Menopause is the normal time of life when menstrual periods stop completely. Menopause is complete when you have missed 12 consecutive menstrual periods. It usually occurs between the ages of 48 years and 55 years. Very rarely does a woman develop menopause before the age of 40 years. At menopause, your ovaries stop producing the female hormones estrogen and progesterone. This can cause undesirable symptoms and also affect your health. Sometimes the symptoms may occur 4-5 years before the menopause begins. There is no relationship between menopause and:  Oral contraceptives.  Number of children you had.  Race.  The age your menstrual periods started (menarche).  Heavy smokers and very thin women may develop menopause earlier in life. What are the causes?  The ovaries stop producing the female hormones estrogen and progesterone. Other causes include:  Surgery to remove both ovaries.  The ovaries stop functioning for no known reason.  Tumors of the pituitary gland in the brain.  Medical disease that affects the ovaries and hormone production.  Radiation treatment to the abdomen or pelvis.  Chemotherapy that affects the ovaries.  What are the signs or symptoms?  Hot flashes.  Night sweats.  Decrease in sex drive.  Vaginal dryness and thinning of the vagina causing painful intercourse.  Dryness of the skin and developing wrinkles.  Headaches.  Tiredness.  Irritability.  Memory problems.  Weight gain.  Bladder infections.  Hair growth of the face and chest.  Infertility. More serious symptoms include:  Loss of bone (osteoporosis) causing breaks (fractures).  Depression.  Hardening and narrowing of the arteries (atherosclerosis) causing heart attacks and strokes.  How is this diagnosed?  When the menstrual periods have stopped for 12 straight months.  Physical exam.  Hormone studies of the blood. How is this treated? There are many treatment  choices and nearly as many questions about them. The decisions to treat or not to treat menopausal changes is an individual choice made with your health care provider. Your health care provider can discuss the treatments with you. Together, you can decide which treatment will work best for you. Your treatment choices may include:  Hormone therapy (estrogen and progesterone).  Non-hormonal medicines.  Treating the individual symptoms with medicine (for example antidepressants for depression).  Herbal medicines that may help specific symptoms.  Counseling by a psychiatrist or psychologist.  Group therapy.  Lifestyle changes including: ? Eating healthy. ? Regular exercise. ? Limiting caffeine and alcohol. ? Stress management and meditation.  No treatment.  Follow these instructions at home:  Take the medicine your health care provider gives you as directed.  Get plenty of sleep and rest.  Exercise regularly.  Eat a diet that contains calcium (good for the bones) and soy products (acts like estrogen hormone).  Avoid alcoholic beverages.  Do not smoke.  If you have hot flashes, dress in layers.  Take supplements, calcium, and vitamin D to strengthen bones.  You can use over-the-counter lubricants or moisturizers for vaginal dryness.  Group therapy is sometimes very helpful.  Acupuncture may be helpful in some cases. Contact a health care provider if:  You are not sure you are in menopause.  You are having menopausal symptoms and need advice and treatment.  You are still having menstrual periods after age 55 years.  You have pain with intercourse.  Menopause is complete (no menstrual period for 12 months) and you develop vaginal bleeding.  You need a referral to a specialist (gynecologist, psychiatrist, or psychologist) for treatment. Get help right   away if:  You have severe depression.  You have excessive vaginal bleeding.  You fell and think you have a  broken bone.  You have pain when you urinate.  You develop leg or chest pain.  You have a fast pounding heart beat (palpitations).  You have severe headaches.  You develop vision problems.  You feel a lump in your breast.  You have abdominal pain or severe indigestion. This information is not intended to replace advice given to you by your health care provider. Make sure you discuss any questions you have with your health care provider. Document Released: 02/11/2004 Document Revised: 04/28/2016 Document Reviewed: 06/20/2013 Elsevier Interactive Patient Education  2017 Elsevier Inc.  

## 2017-08-09 NOTE — Progress Notes (Signed)
Patient ID: Julie [name], female   DOB: 12/07/1970, 46 y.o.   MRN: [id number] History of Present Illness: Julie [name] is a 45 year old white female, in for well woman gyn exam, had normal pap with negative HPV 01/18/16.    Current Medications, Allergies, Past Medical History, Past Surgical History, Family History and Social History were reviewed in [employer] record.     Review of Systems: Patient denies any  hearing loss,blurred vision, shortness of breath, chest pain, abdominal pain, problems with bowel movements, urination, or intercourse. No joint pain or mood swings. +tired, +hot flashes, +irregular periods, +eats ice, and decreased appetite, +does not sleep well at night, and headaches more frequent    Physical Exam:BP 122/74 (BP Location: Right Arm, Patient Position: Sitting, Cuff Size: Normal)   Pulse 100   Ht 5' (1.524 m)   Wt 124 lb (56.2 kg)   BMI 24.22 kg/m  General:  Well developed, well nourished, no acute distress Skin:  Warm and dry Neck:  Midline trachea, normal thyroid, good ROM, no lymphadenopathy Lungs; Clear to auscultation bilaterally Breast:  No dominant palpable mass, retraction, or nipple discharge,has bilateral implants Cardiovascular: Regular rate and rhythm Abdomen:  Soft, non tender, no hepatosplenomegaly Pelvic:  External genitalia is normal in appearance, no lesions.  The vagina is normal in appearance. Urethra has no lesions or masses. The cervix is bulbous.  Uterus is felt to be normal size, shape, and contour.  No adnexal masses or tenderness noted.Bladder is non tender, no masses felt. Rectal: Good sphincter tone, no polyps, or hemorrhoids felt.  Hemoccult negative. Extremities/musculoskeletal:  No swelling or varicosities noted, no clubbing or cyanosis Psych:  No mood changes, alert and cooperative,seems happy PHQ 9 score 25, denies being suicidal, on celexa. Discussed labs and that [facility] 34.5, will try HRT, pt aware of risks and  benefits. Vitamin D 18.3, take 5000 IU vitamin D 3 daily.  Impression: 1. Encounter for annual routine gynecological examination   2. Screening for colorectal cancer   3. Vitamin D deficiency   4. Hot flashes   5. Missed periods   6. Menopause   7. Depression, unspecified depression type   8. Hormone replacement therapy (HRT)       Plan: Meds ordered this encounter  Medications  . butalbital-acetaminophen-caffeine (FIORICET, ESGIC) 50-325-40 MG tablet    Sig: Take 1 tablet by mouth every 6 (six) hours as needed for headache.    Dispense:  30 tablet    Refill:  1    Order Specific Question:   Supervising Provider    Answer:   Elonda Husky, LUTHER H [[id number]]  . progesterone (PROMETRIUM) 200 MG capsule    Sig: Take 1 capsule (200 mg total) by mouth daily.    Dispense:  30 capsule    Refill:  12    Order Specific Question:   Supervising Provider    Answer:   Elonda Husky, LUTHER H [[id number]]  . estradiol (ESTRACE) 1 MG tablet    Sig: Take 1 tablet (1 mg total) by mouth daily.    Dispense:  30 tablet    Refill:  12    Order Specific Question:   Supervising Provider    Answer:   Elonda Husky, LUTHER H [[id number]]  . Cholecalciferol 5000 units capsule    Sig: Take 1 capsule (5,000 Units total) by mouth daily.    Order Specific Question:   Supervising Provider    Answer:   Florian Buff [[id number]]  Continue celexa, has  refills F/U in 6 weeks Physical in 1 year Pap in 2020 Mammogram yearly

## 2017-08-15 ENCOUNTER — Telehealth: Payer: Self-pay | Admitting: Adult Health

## 2017-08-15 NOTE — Telephone Encounter (Signed)
I called in Zyrtec 10 mg and Celexa 40 mg to [facility] in [place]. Pharmacy didn't receive order through [place]. Pt aware. Julie [name]

## 2017-08-22 ENCOUNTER — Telehealth: Payer: Self-pay | Admitting: Adult Health

## 2017-08-22 MED ORDER — AZITHROMYCIN 250 MG PO TABS: ORAL_TABLET | ORAL | 0 refills | Status: DC

## 2017-08-22 MED ORDER — LORATADINE 10 MG PO TABS: 10.0000 mg | ORAL_TABLET | Freq: Every day | ORAL | 6 refills | Status: DC

## 2017-08-22 NOTE — Telephone Encounter (Signed)
Pt complains of sinus infection, has used OTC meds, rx Zpack,

## 2017-08-22 NOTE — Telephone Encounter (Signed)
Patient called stating that she needs a Antibiotic for her Sinus infection and she would like [facility] to place the prescription into her pharmacy. Please contact pt

## 2017-08-24 ENCOUNTER — Telehealth: Payer: Self-pay | Admitting: *Deleted

## 2017-08-24 NOTE — Telephone Encounter (Signed)
Verbal order for Flonase 1-2 sprays in each nostril daily called to pharmacy per patient by [facility].

## 2017-08-30 ENCOUNTER — Other Ambulatory Visit (INDEPENDENT_AMBULATORY_CARE_PROVIDER_SITE_OTHER): Payer: Self-pay | Admitting: Internal Medicine

## 2017-09-19 ENCOUNTER — Telehealth: Payer: Self-pay | Admitting: Obstetrics & Gynecology

## 2017-09-19 NOTE — Telephone Encounter (Signed)
Pt made appt to be seen tomorrow

## 2017-09-20 ENCOUNTER — Ambulatory Visit: Payer: Medicaid Other | Admitting: Adult Health

## 2017-09-26 ENCOUNTER — Ambulatory Visit: Payer: Medicaid Other | Admitting: Adult Health

## 2017-09-28 ENCOUNTER — Ambulatory Visit (INDEPENDENT_AMBULATORY_CARE_PROVIDER_SITE_OTHER): Payer: Self-pay | Admitting: Internal Medicine

## 2017-09-28 ENCOUNTER — Encounter (INDEPENDENT_AMBULATORY_CARE_PROVIDER_SITE_OTHER): Payer: Self-pay | Admitting: Internal Medicine

## 2017-10-03 ENCOUNTER — Ambulatory Visit: Payer: Medicaid Other | Admitting: Adult Health

## 2017-10-09 ENCOUNTER — Ambulatory Visit (INDEPENDENT_AMBULATORY_CARE_PROVIDER_SITE_OTHER): Payer: Self-pay | Admitting: Internal Medicine

## 2017-10-12 ENCOUNTER — Encounter (INDEPENDENT_AMBULATORY_CARE_PROVIDER_SITE_OTHER): Payer: Self-pay | Admitting: Internal Medicine

## 2017-11-09 ENCOUNTER — Telehealth: Payer: Self-pay | Admitting: *Deleted

## 2017-11-09 MED ORDER — BUTALBITAL-APAP-CAFFEINE 50-325-40 MG PO TABS: 1.0000 | ORAL_TABLET | Freq: Four times a day (QID) | ORAL | 1 refills | Status: DC | PRN

## 2017-11-09 NOTE — Telephone Encounter (Signed)
[name] requests refill for pt on fioricet.

## 2017-11-09 NOTE — Telephone Encounter (Signed)
Refilled Fioricet

## 2017-11-22 ENCOUNTER — Encounter: Payer: Self-pay | Admitting: Adult Health

## 2017-11-22 ENCOUNTER — Ambulatory Visit (HOSPITAL_COMMUNITY)
Admission: RE | Admit: 2017-11-22 | Discharge: 2017-11-22 | Disposition: A | Discharge status: Home / Self Care | Payer: Medicaid Other | Source: Ambulatory Visit | Attending: Adult Health | Admitting: Adult Health

## 2017-11-22 ENCOUNTER — Ambulatory Visit: Payer: Medicaid Other | Admitting: Adult Health

## 2017-11-22 VITALS — BP 116/70 | HR 70 | Resp 16 | Ht 60.0 in | Wt 124.0 lb

## 2017-11-22 DIAGNOSIS — R1032 Left lower quadrant pain: Secondary | ICD-10-CM | POA: Insufficient documentation

## 2017-11-22 DIAGNOSIS — R062 Wheezing: Secondary | ICD-10-CM | POA: Diagnosis not present

## 2017-11-22 DIAGNOSIS — R05 Cough: Secondary | ICD-10-CM | POA: Insufficient documentation

## 2017-11-22 DIAGNOSIS — N926 Irregular menstruation, unspecified: Secondary | ICD-10-CM | POA: Insufficient documentation

## 2017-11-22 DIAGNOSIS — R232 Flushing: Secondary | ICD-10-CM | POA: Diagnosis not present

## 2017-11-22 DIAGNOSIS — R059 Cough, unspecified: Secondary | ICD-10-CM

## 2017-11-22 NOTE — Progress Notes (Signed)
Subjective:     Patient ID: Julie [name], female   DOB: June 22, 1971, 46 y.o.   MRN: [id number]  HPI Julie [name] is a 46 year old in complaining of cough is on antibiotics from PCP for head congestion.She is also complaining of bleeding heavy at times with clots and cramps and LLQ pain, she stopped HRT shortly after starting end of September.   Review of Systems +cough Bleeding with clots and LLQ at times Reviewed past medical,surgical, social and family history. Reviewed medications and allergies.     Objective:   Physical Exam BP 116/70 (BP Location: Left Arm, Patient Position: Sitting, Cuff Size: Normal)   Pulse 70   Resp 16   Ht 5' (1.524 m)   Wt 124 lb (56.2 kg)   BMI 24.22 kg/m  Skin warm and dry. Neck: mid line trachea, normal thyroid, good ROM, no lymphadenopathy noted. Lungs: +wheezing  bilaterally. Cardiovascular: regular rate and rhythm. Will check labs, get chest xray and GYN [place].    Assessment:     1. Irregular bleeding   2. Hot flashes   3. Cough   4. Bilateral wheezing   5. LLQ pain       Plan:    Will talk tomorrow when labs and chest xray back.  Orders Placed This Encounter  Procedures  . US PELVIS (TRANSABDOMINAL ONLY)  . US PELVIS TRANSVANGINAL NON-OB (TV ONLY)  . DG Chest 2 View  . Comprehensive metabolic panel  . TSH  . CBC  . Follicle stimulating hormone  Get [place] in about a week and see me 2-3 days later

## 2017-11-23 ENCOUNTER — Telehealth: Payer: Self-pay | Admitting: Adult Health

## 2017-11-23 LAB — CBC
Hematocrit: 32.4 % — ABNORMAL LOW (ref 34.0–46.6)
Hemoglobin: 10.5 g/dL — ABNORMAL LOW (ref 11.1–15.9)
MCH: 29.4 pg (ref 26.6–33.0)
MCHC: 32.4 g/dL (ref 31.5–35.7)
MCV: 91 fL (ref 79–97)
PLATELETS: 379 10*3/uL (ref 150–379)
RBC: 3.57 x10E6/uL — AB (ref 3.77–5.28)
RDW: 15.7 % — ABNORMAL HIGH (ref 12.3–15.4)
WBC: 9 10*3/uL (ref 3.4–10.8)

## 2017-11-23 LAB — COMPREHENSIVE METABOLIC PANEL
A/G RATIO: 1.4 (ref 1.2–2.2)
ALT: 13 IU/L (ref 0–32)
AST: 16 IU/L (ref 0–40)
Albumin: 4.2 g/dL (ref 3.5–5.5)
Alkaline Phosphatase: 87 IU/L (ref 39–117)
BUN/Creatinine Ratio: 8 — ABNORMAL LOW (ref 9–23)
BUN: 5 mg/dL — ABNORMAL LOW (ref 6–24)
Bilirubin Total: 0.2 mg/dL (ref 0.0–1.2)
CALCIUM: 9 mg/dL (ref 8.7–10.2)
CO2: 26 mmol/L (ref 20–29)
CREATININE: 0.63 mg/dL (ref 0.57–1.00)
Chloride: 100 mmol/L (ref 96–106)
GFR calc Af Amer: 123 mL/min/{1.73_m2} (ref >59)
GFR, EST NON AFRICAN AMERICAN: 106 mL/min/{1.73_m2} (ref >59)
Globulin, Total: 2.9 g/dL (ref 1.5–4.5)
Glucose: 58 mg/dL — ABNORMAL LOW (ref 65–99)
Potassium: 3.7 mmol/L (ref 3.5–5.2)
Sodium: 140 mmol/L (ref 134–144)
Total Protein: 7.1 g/dL (ref 6.0–8.5)

## 2017-11-23 LAB — TSH: TSH: 0.402 u[IU]/mL — ABNORMAL LOW (ref 0.450–4.500)

## 2017-11-23 LAB — FOLLICLE STIMULATING HORMONE: FSH: 17.1 m[IU]/mL

## 2017-11-23 MED ORDER — LEVOFLOXACIN 750 MG PO TABS: 750.0000 mg | ORAL_TABLET | Freq: Every day | ORAL | 0 refills | Status: DC

## 2017-11-23 MED ORDER — ALBUTEROL SULFATE HFA 108 (90 BASE) MCG/ACT IN AERS: 2.0000 | INHALATION_SPRAY | Freq: Four times a day (QID) | RESPIRATORY_TRACT | 2 refills | Status: DC | PRN

## 2017-11-23 NOTE — Telephone Encounter (Signed)
Pt aware of labs, take OTC iron, and that chest xray possible early pneumonia and chronic bronchitic changes.Decrease smoking,Rx levaquin 750 mg 1 daily for 5 days and albuterol inhaler and will repeat chest xray in 4 weeks

## 2017-12-07 ENCOUNTER — Other Ambulatory Visit: Payer: Medicaid Other

## 2017-12-08 ENCOUNTER — Ambulatory Visit: Payer: Medicaid Other | Admitting: Adult Health

## 2017-12-10 ENCOUNTER — Emergency Department (HOSPITAL_COMMUNITY)
Admission: EM | Admit: 2017-12-10 | Discharge: 2017-12-10 | Disposition: A | Discharge status: Home / Self Care | Payer: Medicaid Other

## 2017-12-11 ENCOUNTER — Other Ambulatory Visit: Payer: Medicaid Other

## 2017-12-13 ENCOUNTER — Ambulatory Visit (INDEPENDENT_AMBULATORY_CARE_PROVIDER_SITE_OTHER): Payer: Self-pay | Admitting: Internal Medicine

## 2017-12-14 ENCOUNTER — Other Ambulatory Visit: Payer: Self-pay | Admitting: Adult Health

## 2017-12-14 ENCOUNTER — Ambulatory Visit (INDEPENDENT_AMBULATORY_CARE_PROVIDER_SITE_OTHER): Payer: Medicaid Other

## 2017-12-14 DIAGNOSIS — N926 Irregular menstruation, unspecified: Secondary | ICD-10-CM | POA: Diagnosis not present

## 2017-12-14 DIAGNOSIS — R1032 Left lower quadrant pain: Secondary | ICD-10-CM | POA: Diagnosis not present

## 2017-12-14 NOTE — Progress Notes (Signed)
PELVIC US TA/TV: Heterogeneous anteverted uterus w/a posterior submucosal fibroid distorting the endometrium,EEC 5.5 mm,normal ovaries bilat,limited movement of left ovary w/probe pressure,pt has a lot of pelvic pain,right ovary appears mobile

## 2017-12-15 ENCOUNTER — Telehealth: Payer: Self-pay | Admitting: Adult Health

## 2017-12-15 MED ORDER — IBUPROFEN 800 MG PO TABS: 800.0000 mg | ORAL_TABLET | Freq: Three times a day (TID) | ORAL | 1 refills | Status: DC | PRN

## 2017-12-15 NOTE — Telephone Encounter (Signed)
Pt called stating that she came into the office yesterday for an ultrasound and pt states that its was painful and she is still hurting. Pt would like to know if she could get something for pain. Please contact pt

## 2017-12-15 NOTE — Telephone Encounter (Signed)
Spoke with pt. Pt was having pelvic pain before [place] yesterday and now pain is worse. She has tried Ibuprofen 800 mg and Tylenol 500 mg with no help. She is out of Ibuprofen 800 mg now. Also vaginal bleeding since [place]. Has an appt 12/18/17. She is requesting something for pain. Please advise. Thanks!! [place]

## 2017-12-15 NOTE — Telephone Encounter (Signed)
Will refill motrin 800 mg til Monday

## 2017-12-18 ENCOUNTER — Ambulatory Visit: Payer: Medicaid Other | Admitting: Adult Health

## 2017-12-21 ENCOUNTER — Encounter (INDEPENDENT_AMBULATORY_CARE_PROVIDER_SITE_OTHER): Payer: Self-pay | Admitting: Internal Medicine

## 2017-12-21 ENCOUNTER — Ambulatory Visit: Payer: Medicaid Other | Admitting: Adult Health

## 2017-12-21 ENCOUNTER — Ambulatory Visit (INDEPENDENT_AMBULATORY_CARE_PROVIDER_SITE_OTHER): Payer: Medicaid Other | Admitting: Internal Medicine

## 2017-12-21 ENCOUNTER — Other Ambulatory Visit (INDEPENDENT_AMBULATORY_CARE_PROVIDER_SITE_OTHER): Payer: Self-pay | Admitting: Internal Medicine

## 2017-12-21 VITALS — BP 120/80 | HR 64 | Temp 97.9°F | Ht 60.0 in | Wt 118.3 lb

## 2017-12-21 DIAGNOSIS — R1013 Epigastric pain: Secondary | ICD-10-CM | POA: Diagnosis not present

## 2017-12-21 LAB — CBC WITH DIFFERENTIAL/PLATELET
Basophils Absolute: 22 cells/uL (ref 0–200)
Basophils Relative: 0.2 %
Eosinophils Absolute: 251 cells/uL (ref 15–500)
Eosinophils Relative: 2.3 %
HCT: 31.5 % — ABNORMAL LOW (ref 35.0–45.0)
Hemoglobin: 10.3 g/dL — ABNORMAL LOW (ref 11.7–15.5)
Lymphs Abs: 3717 cells/uL (ref 850–3900)
MCH: 28.8 pg (ref 27.0–33.0)
MCHC: 32.7 g/dL (ref 32.0–36.0)
MCV: 88 fL (ref 80.0–100.0)
MPV: 10.5 fL (ref 7.5–12.5)
Monocytes Relative: 7 %
NEUTROS PCT: 56.4 %
Neutro Abs: 6148 cells/uL (ref 1500–7800)
Platelets: 358 10*3/uL (ref 140–400)
RBC: 3.58 10*6/uL — AB (ref 3.80–5.10)
RDW: 13.9 % (ref 11.0–15.0)
TOTAL LYMPHOCYTE: 34.1 %
WBC: 10.9 10*3/uL — AB (ref 3.8–10.8)
WBCMIX: 763 {cells}/uL (ref 200–950)

## 2017-12-21 MED ORDER — SUCRALFATE 1 G PO TABS
1.0000 g | ORAL_TABLET | Freq: Three times a day (TID) | ORAL | 3 refills | Status: DC
Start: 2017-12-21 — End: 2018-01-23

## 2017-12-21 NOTE — Patient Instructions (Addendum)
Rx for Carafate sent to her pharmacy CBC today.

## 2017-12-21 NOTE — Progress Notes (Addendum)
Subjective:    Patient ID: Julie [name], female    DOB: 02-May-1971, 47 y.o.   MRN: [id number]  HPIHere today with c/o epigastric pain. She said the pain started last week. She says she has had normal BM.  She said last week she had a BM and almost past out. She went to the ED at [facility] hospital. She did not stay however. She says she has been heart palpations every since. She says she feels okay today. She is taking Motrin 800mg  twice a day. She started taking them about 2 weeks ago for pelvic pain.  Her last EGD was in 2015.   3/24/2015EGD  Indications: Patient is a 47 year old Caucasian female who has chronic diarrhea felt to be secondary to IBS unresponsible therapy. She was screened for celiac disease. Tissue trueness glutaminase antibody IgA and gliadin antibody IgG are both elevated. She is therefore undergoing EGD with duodenal biopsy. She also has chronic GERD and maintained on PPI. Impression: Small sliding-type hernia without evidence of esophagitis. Small amount of food debris in the stomach with patent pylorus and no evidence of peptic ulcer disease. No endoscopic changes of celiac disease noted involving bulbar and post bulbar mucosa. Biopsy taken.    Review of Systems Past Medical History:  Diagnosis Date  . Anger   . Anxiety   . Aortic insufficiency 2009   Noted at heart cath  . Arthritis   . Asthma   . Back pain   . BV (bacterial vaginosis) 05/28/2015  . Chronic diarrhea   . Depression   . Diarrhea 05/28/2015  . Dyslipidemia 01/19/2016  . GERD (gastroesophageal reflux disease)   . History of kidney stones    history of  . History of nuclear stress test 07/15/2008   lexiscan; mild-mod perfusion defect in mid anteroseptal, apical anterior, apical septal regions (attenuation artifiact), prominent gut uptake activity in infero-apical region;  post-stress EF 64%; EKG negative for ischemia; patient experienced CP during test  . Irritable bowel syndrome   . Nausea 05/28/2015  . Neck pain   . Numerous moles 01/18/2016  . RLQ abdominal pain 05/28/2015  . Seizures ([place])    2 years ago; unknown etiology and none since then and on no meds  . Vaginal discharge 05/28/2015  . Weight gain 01/18/2016    Past Surgical History:  Procedure Laterality Date  . BIOPSY N/A 02/25/2014   Procedure: BIOPSY;  Surgeon: Rogene Houston, MD;  Location: [facility];  Service: Endoscopy;  Laterality: N/A;  . BREAST ENHANCEMENT SURGERY    . CARDIAC CATHETERIZATION  1025//2006   no significant CAD, mild-mod depressed LV systolic function, EF 86-76%, mild-mod AI (Dr. Gerrie Nordmann)   . CARPAL TUNNEL RELEASE Bilateral 01/01/2016  . CESAREAN SECTION    . COLONOSCOPY  05/27/2011   snare polypectomy   . COLONOSCOPY WITH PROPOFOL N/A 10/21/2016   Procedure: COLONOSCOPY WITH PROPOFOL;  Surgeon: Rogene Houston, MD;  Location: [facility];  Service: Endoscopy;  Laterality: N/A;  10:30  . ESOPHAGOGASTRODUODENOSCOPY N/A 02/25/2014   Procedure: ESOPHAGOGASTRODUODENOSCOPY (EGD);  Surgeon: Rogene Houston, MD;  Location: [facility];  Service: Endoscopy;  Laterality: N/A;  730  . KNEE SURGERY Right   . TRANSTHORACIC ECHOCARDIOGRAM  06/2093   LV systolic function is low-normal; RV is normal in size & function; mild MR, mild TR  . TUBAL LIGATION      Allergies  Allergen Reactions  . Flagyl [Metronidazole Hcl] Nausea And Vomiting  . Penicillins Other (  See Comments)    Caused patient to pass out Has patient had a PCN reaction causing immediate rash, facial/tongue/throat swelling, SOB or lightheadedness with hypotension: unknown Has patient had a PCN reaction causing severe rash involving mucus membranes or skin necrosis: No Has patient had a PCN reaction that required hospitalization No Has patient had a PCN reaction occurring within the last 10 years: No If all of  the above answers are "NO", then may proceed with Cephalosporin use.      Current Outpatient Medications on File Prior to Visit  Medication Sig Dispense Refill  . albuterol (PROVENTIL HFA;VENTOLIN HFA) 108 (90 Base) MCG/ACT inhaler Inhale 2 puffs into the lungs every 6 (six) hours as needed for wheezing or shortness of breath. 1 Inhaler 2  . butalbital-acetaminophen-caffeine (FIORICET, ESGIC) 50-325-40 MG tablet Take 1 tablet by mouth every 6 (six) hours as needed for headache. 30 tablet 1  . Cetirizine HCl (ZYRTEC ALLERGY) 10 MG CAPS Take 1 daily 30 capsule 12  . Cholecalciferol 5000 units capsule Take 1 capsule (5,000 Units total) by mouth daily.    . citalopram (CELEXA) 40 MG tablet Take 1 tablet (40 mg total) by mouth daily. 30 tablet 6  . ibuprofen (ADVIL,MOTRIN) 800 MG tablet Take 1 tablet (800 mg total) by mouth every 8 (eight) hours as needed for mild pain. 60 tablet 1  . loratadine (CLARITIN) 10 MG tablet Take 1 tablet (10 mg total) by mouth daily. 30 tablet 6  . pantoprazole (PROTONIX) 20 MG tablet TAKE (1) TABLET TWICE A DAY BEFORE MEALS. 60 tablet 3   No current facility-administered medications on file prior to visit.         Objective:   Physical Exam Blood pressure 120/80, pulse 64, temperature 97.9 F (36.6 C), height 5' (1.524 m), weight 118 lb 4.8 oz (53.7 kg).  Alert and oriented. Skin warm and dry. Oral mucosa is moist.   . Sclera anicteric, conjunctivae is pink. Thyroid not enlarged. No cervical lymphadenopathy. Lungs clear. Heart regular rate and rhythm.  Abdomen is soft. Bowel sounds are positive. No hepatomegaly. No abdominal masses felt. No tenderness.  No edema to lower extremities.          Assessment & Plan:  GERD. Continue the Protonix. Stop the Motrin.  CBC today.  Rx for Carafate 1gm po tid.

## 2017-12-22 ENCOUNTER — Ambulatory Visit (INDEPENDENT_AMBULATORY_CARE_PROVIDER_SITE_OTHER): Payer: Medicaid Other | Admitting: Adult Health

## 2017-12-22 ENCOUNTER — Ambulatory Visit (HOSPITAL_COMMUNITY)
Admission: RE | Admit: 2017-12-22 | Discharge: 2017-12-22 | Disposition: A | Discharge status: Home / Self Care | Payer: Medicaid Other | Source: Ambulatory Visit | Attending: Adult Health | Admitting: Adult Health

## 2017-12-22 ENCOUNTER — Other Ambulatory Visit (INDEPENDENT_AMBULATORY_CARE_PROVIDER_SITE_OTHER): Payer: Self-pay | Admitting: *Deleted

## 2017-12-22 ENCOUNTER — Encounter: Payer: Self-pay | Admitting: Adult Health

## 2017-12-22 VITALS — BP 130/78 | HR 91 | Wt 117.0 lb

## 2017-12-22 DIAGNOSIS — Z8701 Personal history of pneumonia (recurrent): Secondary | ICD-10-CM | POA: Diagnosis present

## 2017-12-22 DIAGNOSIS — F418 Other specified anxiety disorders: Secondary | ICD-10-CM

## 2017-12-22 DIAGNOSIS — Z113 Encounter for screening for infections with a predominantly sexual mode of transmission: Secondary | ICD-10-CM

## 2017-12-22 DIAGNOSIS — A599 Trichomoniasis, unspecified: Secondary | ICD-10-CM | POA: Diagnosis not present

## 2017-12-22 DIAGNOSIS — F419 Anxiety disorder, unspecified: Secondary | ICD-10-CM

## 2017-12-22 DIAGNOSIS — N898 Other specified noninflammatory disorders of vagina: Secondary | ICD-10-CM

## 2017-12-22 DIAGNOSIS — D250 Submucous leiomyoma of uterus: Secondary | ICD-10-CM | POA: Insufficient documentation

## 2017-12-22 DIAGNOSIS — R059 Cough, unspecified: Secondary | ICD-10-CM

## 2017-12-22 DIAGNOSIS — R05 Cough: Secondary | ICD-10-CM | POA: Diagnosis not present

## 2017-12-22 DIAGNOSIS — N951 Menopausal and female climacteric states: Secondary | ICD-10-CM

## 2017-12-22 DIAGNOSIS — D219 Benign neoplasm of connective and other soft tissue, unspecified: Secondary | ICD-10-CM

## 2017-12-22 DIAGNOSIS — K625 Hemorrhage of anus and rectum: Secondary | ICD-10-CM

## 2017-12-22 DIAGNOSIS — F329 Major depressive disorder, single episode, unspecified: Secondary | ICD-10-CM

## 2017-12-22 HISTORY — DX: Submucous leiomyoma of uterus: D25.0

## 2017-12-22 LAB — POCT WET PREP (WET MOUNT): WBC WET PREP: POSITIVE

## 2017-12-22 MED ORDER — TINIDAZOLE 500 MG PO TABS: ORAL_TABLET | ORAL | 0 refills | Status: DC

## 2017-12-22 MED ORDER — PROGESTERONE MICRONIZED 200 MG PO CAPS: 200.0000 mg | ORAL_CAPSULE | Freq: Every day | ORAL | 6 refills | Status: DC

## 2017-12-22 MED ORDER — LORAZEPAM 0.5 MG PO TABS: ORAL_TABLET | ORAL | 1 refills | Status: DC

## 2017-12-22 NOTE — Progress Notes (Signed)
Subjective:     Patient ID: Julie [name], female   DOB: 12-Mar-1971, 47 y.o.   MRN: [id number]  HPI Amiaya is a 47 year old white female in complaining of vaginal irritation, and cough still and needs [place] results. Saw Terri Setzer,NP yesterday for stomach issues.   Review of Systems +vaginal irritation +cough still Reviewed past medical,surgical, social and family history. Reviewed medications and allergies.     Objective:   Physical Exam BP 130/78 (BP Location: Left Arm, Patient Position: Sitting, Cuff Size: Normal)   Pulse 91   Wt 117 lb (53.1 kg)   BMI 22.85 kg/m  Skin warm and dry.  Lungs: clear to ausculation on right, inspiratory wheezing on left. Cardiovascular: regular rate and rhythm. Pelvic: external genitalia is normal in appearance no lesions, vagina: pink discharge, no odor,urethra has no lesions or masses noted, cervix:smooth and bulbous, uterus: normal size, shape and contour, non tender, no masses felt, adnexa: no masses or tenderness noted. Bladder is non tender and no masses felt. Wet prep: + for trich and +WBCs. GC/CHL on urine sent   PHQ 9 score 17, is on celexa, but request some ativan too for anxiety.  US showed small  Fibroid, ovaries normal but left limited and + pain on [place].   Assessment:     1. Vaginal irritation   2. Trichomoniasis   3. Cough   4. History of pneumonia   5. Fibroid   6. Perimenopause   7. Anxiety and depression   8. Screening examination for STD (sexually transmitted disease)       Plan:     Meds ordered this encounter  Medications  . tinidazole (TINDAMAX) 500 MG tablet    Sig: Take 4 po now    Dispense:  4 tablet    Refill:  0    Order Specific Question:   Supervising Provider    Answer:   Elonda Husky, LUTHER H [[id number]]  . progesterone (PROMETRIUM) 200 MG capsule    Sig: Take 1 capsule (200 mg total) by mouth daily.    Dispense:  30 capsule    Refill:  6    Order Specific Question:   Supervising Provider    Answer:   Elonda Husky, LUTHER H  [[id number]]  . LORazepam (ATIVAN) 0.5 MG tablet    Sig: Take 1 bid prn anxiety    Dispense:  30 tablet    Refill:  1    Order Specific Question:   Supervising Provider    Answer:   Tania Ade H [[id number]]  Resume estrace 1 mg daily,has at home GC/CHL sent  Review handout on trich Partner to clinic or PCP Get chest Xray today F/U in 10 days for POC on trich

## 2017-12-24 LAB — GC/CHLAMYDIA PROBE AMP
Chlamydia trachomatis, NAA: NEGATIVE
Neisseria gonorrhoeae by PCR: NEGATIVE

## 2017-12-26 ENCOUNTER — Telehealth: Payer: Self-pay | Admitting: Adult Health

## 2017-12-26 NOTE — Telephone Encounter (Signed)
Left message that chest xray clear, and that I will need his name and DOB with drug store to treat, and we don't have options, get him to pay for Tindamax.

## 2017-12-26 NOTE — Telephone Encounter (Signed)
Called flagyl 500 mg # 4 po now to [facility] for [employer], DOB [birth year]. There is interaction with Warfarin which he is on for increased bleeding but not so much with 1 time dose,but if he does not want to take, use condoms

## 2017-12-28 ENCOUNTER — Telehealth: Payer: Self-pay | Admitting: Adult Health

## 2017-12-28 MED ORDER — ONDANSETRON HCL 8 MG PO TABS: 8.0000 mg | ORAL_TABLET | Freq: Three times a day (TID) | ORAL | 0 refills | Status: DC | PRN

## 2017-12-28 NOTE — Telephone Encounter (Signed)
Pt took tindamax and is having nausea, and some vomiting, will rx zofran

## 2018-01-02 ENCOUNTER — Telehealth: Payer: Self-pay | Admitting: Adult Health

## 2018-01-02 ENCOUNTER — Ambulatory Visit: Payer: Medicaid Other | Admitting: Adult Health

## 2018-01-02 NOTE — Telephone Encounter (Signed)
rx was called to [name], so go there and ask for it,since that is not his normal drug store they won't fill it till he asks for it.

## 2018-01-03 ENCOUNTER — Telehealth (INDEPENDENT_AMBULATORY_CARE_PROVIDER_SITE_OTHER): Payer: Self-pay | Admitting: *Deleted

## 2018-01-03 NOTE — Telephone Encounter (Signed)
patient called stating she was taking the fucralsate off brand for carfate 1mg  tablets and she had to quit taking it. Patient states the side effects was taking over. Patient states she is still having pains in her stomach. Please advise [phone]

## 2018-01-04 ENCOUNTER — Telehealth (INDEPENDENT_AMBULATORY_CARE_PROVIDER_SITE_OTHER): Payer: Self-pay | Admitting: Internal Medicine

## 2018-01-04 MED ORDER — DICYCLOMINE HCL 10 MG PO CAPS: 10.0000 mg | ORAL_CAPSULE | Freq: Two times a day (BID) | ORAL | 3 refills | Status: DC

## 2018-01-04 NOTE — Telephone Encounter (Signed)
Will call patient when I return on Monday to schedule an appointment due to not being at this current site at this time.

## 2018-01-04 NOTE — Telephone Encounter (Signed)
She will have OV in 4 weeks. Rx for Dicyclomine call to her office. She will take BID

## 2018-01-04 NOTE — Telephone Encounter (Signed)
Needs OV in 4 weeks. 

## 2018-01-05 ENCOUNTER — Ambulatory Visit: Payer: Medicaid Other | Admitting: Adult Health

## 2018-01-08 NOTE — Telephone Encounter (Signed)
Appointment has been made for 01/18/18 at 10:00, left appointment information on voicemail and a appointment letter mailed also.

## 2018-01-10 ENCOUNTER — Encounter (HOSPITAL_COMMUNITY): Payer: Self-pay | Admitting: Emergency Medicine

## 2018-01-10 ENCOUNTER — Other Ambulatory Visit: Payer: Self-pay

## 2018-01-10 ENCOUNTER — Emergency Department (HOSPITAL_COMMUNITY): Payer: Medicaid Other

## 2018-01-10 ENCOUNTER — Emergency Department (HOSPITAL_COMMUNITY)
Admission: EM | Admit: 2018-01-10 | Discharge: 2018-01-10 | Discharge status: Against Medical Advice | Payer: Medicaid Other | Attending: Emergency Medicine | Admitting: Emergency Medicine

## 2018-01-10 DIAGNOSIS — F17210 Nicotine dependence, cigarettes, uncomplicated: Secondary | ICD-10-CM | POA: Insufficient documentation

## 2018-01-10 DIAGNOSIS — J45909 Unspecified asthma, uncomplicated: Secondary | ICD-10-CM | POA: Insufficient documentation

## 2018-01-10 DIAGNOSIS — R002 Palpitations: Secondary | ICD-10-CM | POA: Diagnosis present

## 2018-01-10 HISTORY — DX: Other specified postprocedural states: Z98.890

## 2018-01-10 HISTORY — DX: Other chronic pain: G89.29

## 2018-01-10 HISTORY — DX: Headache, unspecified: R51.9

## 2018-01-10 HISTORY — DX: Headache: R51

## 2018-01-10 HISTORY — DX: Irritable bowel syndrome, unspecified: K58.9

## 2018-01-10 LAB — BASIC METABOLIC PANEL
Anion gap: 10 (ref 5–15)
BUN: 5 mg/dL — ABNORMAL LOW (ref 6–20)
CHLORIDE: 100 mmol/L — AB (ref 101–111)
CO2: 23 mmol/L (ref 22–32)
CREATININE: 0.57 mg/dL (ref 0.44–1.00)
Calcium: 9.7 mg/dL (ref 8.9–10.3)
GFR calc Af Amer: >60 mL/min (ref >60)
GFR calc non Af Amer: >60 mL/min (ref >60)
GLUCOSE: 92 mg/dL (ref 65–99)
Potassium: 4.2 mmol/L (ref 3.5–5.1)
Sodium: 133 mmol/L — ABNORMAL LOW (ref 135–145)

## 2018-01-10 LAB — HEPATIC FUNCTION PANEL
ALBUMIN: 4 g/dL (ref 3.5–5.0)
ALK PHOS: 90 U/L (ref 38–126)
ALT: 16 U/L (ref 14–54)
AST: 25 U/L (ref 15–41)
Bilirubin, Direct: <0.1 mg/dL — ABNORMAL LOW (ref 0.1–0.5)
TOTAL PROTEIN: 8.1 g/dL (ref 6.5–8.1)
Total Bilirubin: 0.4 mg/dL (ref 0.3–1.2)

## 2018-01-10 LAB — CBC WITH DIFFERENTIAL/PLATELET
Basophils Absolute: 0 10*3/uL (ref 0.0–0.1)
Basophils Relative: 0 %
Eosinophils Absolute: 0.3 10*3/uL (ref 0.0–0.7)
Eosinophils Relative: 3 %
HEMATOCRIT: 42 % (ref 36.0–46.0)
HEMOGLOBIN: 13 g/dL (ref 12.0–15.0)
Lymphocytes Relative: 30 %
Lymphs Abs: 3.6 10*3/uL (ref 0.7–4.0)
MCH: 28.8 pg (ref 26.0–34.0)
MCHC: 31 g/dL (ref 30.0–36.0)
MCV: 92.9 fL (ref 78.0–100.0)
MONOS PCT: 9 %
Monocytes Absolute: 1 10*3/uL (ref 0.1–1.0)
NEUTROS PCT: 58 %
Neutro Abs: 6.9 10*3/uL (ref 1.7–7.7)
Platelets: 539 10*3/uL — ABNORMAL HIGH (ref 150–400)
RBC: 4.52 MIL/uL (ref 3.87–5.11)
RDW: 14.6 % (ref 11.5–15.5)
WBC: 11.8 10*3/uL — ABNORMAL HIGH (ref 4.0–10.5)

## 2018-01-10 LAB — D-DIMER, QUANTITATIVE: D-Dimer, Quant: 0.27 ug/mL-FEU (ref 0.00–0.50)

## 2018-01-10 LAB — MAGNESIUM: Magnesium: 1.7 mg/dL (ref 1.7–2.4)

## 2018-01-10 LAB — TROPONIN I

## 2018-01-10 LAB — LIPASE, BLOOD: Lipase: 25 U/L (ref 11–51)

## 2018-01-10 NOTE — ED Notes (Signed)
Pt returned from xray

## 2018-01-10 NOTE — ED Provider Notes (Signed)
[facility] EMERGENCY DEPARTMENT Provider Note   CSN: [id number] Arrival date & time: 01/10/18  1211     History   Chief Complaint Chief Complaint  Patient presents with  . Tachycardia    HPI Julie [name] is a 47 y.o. female.     Pt was seen at 1510. Per pt, c/o sudden onset and resolution of one episode of "fast HR" that occurred when she woke up this morning approximately 1030am. Pt states she measured her HR with her home BP machine. Pt endorses hx of similar symptoms, but did not seek medical treatment. Also endorses chronic headache and chronic mid-epigastric abd "pain." Denies any changes in her usual chronic pain patterns. Pt has been extensively evaluated by her PMD and GI MD for these issues. Denies palpitations currently. Denies SOB/cough, no fevers, no N/V/D, no back pain, no calf/LE pain or unilateral swelling.    Past Medical History:  Diagnosis Date  . Anger   . Anxiety   . Aortic insufficiency 2006   Noted at heart cath - mild to moderate  . Arthritis   . Asthma   . Back pain   . BV (bacterial vaginosis) 05/28/2015  . Chronic diarrhea   . Chronic headache   . Depression   . Diarrhea 05/28/2015  . Dyslipidemia 01/19/2016  . GERD (gastroesophageal reflux disease)   . History of kidney stones    history of  . History of nuclear stress test 07/15/2008   lexiscan; mild-mod perfusion defect in mid anteroseptal, apical anterior, apical septal regions (attenuation artifiact), prominent gut uptake activity in infero-apical region; post-stress EF 64%; EKG negative for ischemia; patient experienced CP during test  . Hx of cardiac catheterization 2006   no significant CAD  . IBS (irritable bowel syndrome)   . Irritable bowel syndrome   . Nausea 05/28/2015  . Neck pain   . Numerous moles 01/18/2016  . RLQ abdominal pain 05/28/2015  . Seizures ([place])    2 years ago; unknown etiology and none since then and on no meds  . Vaginal discharge 05/28/2015  . Weight gain  01/18/2016    Patient Active Problem List   Diagnosis Date Noted  . Perimenopause 12/22/2017  . Fibroid 12/22/2017  . History of pneumonia 12/22/2017  . Trichomoniasis 12/22/2017  . Vaginal irritation 12/22/2017  . Anxiety and depression 12/22/2017  . Bilateral wheezing 11/22/2017  . Cough 11/22/2017  . Irregular bleeding 11/22/2017  . LLQ pain 11/22/2017  . Menopause 08/09/2017  . Hormone replacement therapy (HRT) 08/09/2017  . Pregnancy examination or test, negative result 07/31/2017  . Abdominal cramps 07/31/2017  . Missed periods 07/31/2017  . Tired 07/31/2017  . Encounter for annual routine gynecological examination 07/31/2017  . Elevated cholesterol 07/31/2017  . Depression 07/31/2017  . Hot flashes 07/31/2017  . Lobar pneumonia ([place]) 01/04/2017  . Intractable vomiting with nausea 01/03/2017  . PNA (pneumonia) 01/03/2017  . Hx of colonic polyps 09/27/2016  . Vitamin D deficiency 01/19/2016  . Dyslipidemia 01/19/2016  . Numerous moles 01/18/2016  . Weight gain 01/18/2016  . Generalized anxiety disorder 08/27/2015  . Tobacco abuse 02/07/2014  . Palpitations 12/26/2013  . Rectal bleeding 11/12/2013  . Hemorrhoids 11/12/2013  . Nausea alone 11/12/2013  . IBS (irritable bowel syndrome) 08/16/2012  . GERD (gastroesophageal reflux disease) 08/16/2012  . Arthritis 08/16/2012    Past Surgical History:  Procedure Laterality Date  . BIOPSY N/A 02/25/2014   Procedure: BIOPSY;  Surgeon: Rogene Houston, MD;  Location: [facility];  Service: Endoscopy;  Laterality: N/A;  . BREAST ENHANCEMENT SURGERY    . CARDIAC CATHETERIZATION  1025//2006   no significant CAD, mild-mod depressed LV systolic function, EF 57-32%, mild-mod AI (Dr. Gerrie Nordmann)   . CARPAL TUNNEL RELEASE Bilateral 01/01/2016  . CESAREAN SECTION    . COLONOSCOPY  05/27/2011   snare polypectomy   . COLONOSCOPY WITH PROPOFOL N/A 10/21/2016   Procedure: COLONOSCOPY WITH PROPOFOL;  Surgeon: Rogene Houston, MD;   Location: [facility];  Service: Endoscopy;  Laterality: N/A;  10:30  . ESOPHAGOGASTRODUODENOSCOPY N/A 02/25/2014   Procedure: ESOPHAGOGASTRODUODENOSCOPY (EGD);  Surgeon: Rogene Houston, MD;  Location: [facility];  Service: Endoscopy;  Laterality: N/A;  730  . KNEE SURGERY Right   . TRANSTHORACIC ECHOCARDIOGRAM  01/253   LV systolic function is low-normal; RV is normal in size & function; mild MR, mild TR  . TUBAL LIGATION      OB History    Gravida Para Term Preterm AB Living   6 4     2 4    SAB TAB Ectopic Multiple Live Births   2               Home Medications    Prior to Admission medications   Medication Sig Start Date End Date Taking? Authorizing Provider  albuterol (PROVENTIL HFA;VENTOLIN HFA) 108 (90 Base) MCG/ACT inhaler Inhale 2 puffs into the lungs every 6 (six) hours as needed for wheezing or shortness of breath. 11/23/17   Estill Dooms, NP  butalbital-acetaminophen-caffeine (FIORICET, ESGIC) [phone] MG tablet Take 1 tablet by mouth every 6 (six) hours as needed for headache. 11/09/17   Estill Dooms, NP  Cetirizine HCl (ZYRTEC ALLERGY) 10 MG CAPS Take 1 daily 07/31/17   Derrek Monaco A, NP  Cholecalciferol 5000 units capsule Take 1 capsule (5,000 Units total) by mouth daily. 08/09/17   Estill Dooms, NP  citalopram (CELEXA) 40 MG tablet Take 1 tablet (40 mg total) by mouth daily. 07/31/17   Estill Dooms, NP  diclofenac (VOLTAREN) 75 MG EC tablet Take 75 mg by mouth.    [provider]  dicyclomine (BENTYL) 10 MG capsule Take 1 capsule (10 mg total) by mouth 2 (two) times daily before a meal. 01/04/18   Setzer, Rona Ravens, NP  estradiol (ESTRACE) 1 MG tablet Take 1 mg by mouth. 08/09/17   [provider]  ibuprofen (ADVIL,MOTRIN) 800 MG tablet Take 1 tablet (800 mg total) by mouth every 8 (eight) hours as needed for mild pain. 12/15/17   Estill Dooms, NP  loratadine (CLARITIN) 10 MG tablet Take 1 tablet (10 mg total) by  mouth daily. 08/22/17   Estill Dooms, NP  LORazepam (ATIVAN) 0.5 MG tablet Take 1 bid prn anxiety 12/22/17   Estill Dooms, NP  ondansetron (ZOFRAN) 8 MG tablet Take 1 tablet (8 mg total) by mouth every 8 (eight) hours as needed for nausea or vomiting. 12/28/17   Estill Dooms, NP  pantoprazole (PROTONIX) 20 MG tablet TAKE (1) TABLET TWICE A DAY BEFORE MEALS. 08/30/17   Setzer, Rona Ravens, NP  progesterone (PROMETRIUM) 200 MG capsule Take 1 capsule (200 mg total) by mouth daily. 12/22/17   Estill Dooms, NP  sucralfate (CARAFATE) 1 g tablet Take 1 tablet (1 g total) by mouth 4 (four) times daily -  with meals and at bedtime. 12/21/17   Setzer, Rona Ravens, NP  tinidazole (TINDAMAX) 500 MG tablet Take 4 po  now 12/22/17   Estill Dooms, NP    Family History Family History  Problem Relation Age of Onset  . Other Mother        heart problems  . Other Brother        neck and back surgery  . Other Maternal Grandmother        brain tumor  . Leukemia Maternal Grandfather     Social History Social History   Tobacco Use  . Smoking status: Current Every Day Smoker    Packs/day: 0.50    Years: 28.00    Pack years: 14.00    Types: Cigarettes    Start date: 10/11/1986  . Smokeless tobacco: Never Used  Substance Use Topics  . Alcohol use: Yes    Alcohol/week: 0.0 oz    Comment: occasionally - beer; not now  . Drug use: Yes    Types: Marijuana    Comment: 2 days ago     Allergies   Flagyl [metronidazole hcl] and Penicillins   Review of Systems Review of Systems ROS: Statement: All systems negative except as marked or noted in the HPI; Constitutional: Negative for fever and chills. ; ; Eyes: Negative for eye pain, redness and discharge. ; ; ENMT: Negative for ear pain, hoarseness, nasal congestion, sinus pressure and sore throat. ; ; Cardiovascular: +palpitations. Negative for chest pain, diaphoresis, dyspnea and peripheral edema. ; ; Respiratory: Negative for cough,  wheezing and stridor. ; ; Gastrointestinal: Negative for nausea, vomiting, diarrhea, abdominal pain, blood in stool, hematemesis, jaundice and rectal bleeding. . ; ; Genitourinary: Negative for dysuria, flank pain and hematuria. ; ; Musculoskeletal: Negative for back pain and neck pain. Negative for swelling and trauma.; ; Skin: Negative for pruritus, rash, abrasions, blisters, bruising and skin lesion.; ; Neuro: Negative for headache, lightheadedness and neck stiffness. Negative for weakness, altered level of consciousness, altered mental status, extremity weakness, paresthesias, involuntary movement, seizure and syncope.       Physical Exam Updated Vital Signs BP 114/85 (BP Location: Left Arm)   Pulse 93   Temp 98.4 F (36.9 C) (Oral)   Resp 14   Ht 5' (1.524 m)   Wt 53.1 kg (117 lb)   SpO2 99%   BMI 22.85 kg/m    Patient Vitals for the past 24 hrs:  BP Temp Temp src Pulse Resp SpO2 Height Weight  01/10/18 1530 123/89 - - 93 16 99 % - -  01/10/18 1506 114/85 98.4 F (36.9 C) Oral 93 14 99 % - -  01/10/18 1218 - - - - - - 5' (1.524 m) 53.1 kg (117 lb)  01/10/18 1217 101/82 97.7 F (36.5 C) Oral (!) 116 20 100 % - -  01/10/18 1215 - - - - - - 5' (1.524 m) 51.7 kg (114 lb)     Physical Exam 1515: Physical examination:  Nursing notes reviewed; Vital signs and O2 SAT reviewed;  Constitutional: Well developed, Well nourished, Well hydrated, In no acute distress; Head:  Normocephalic, atraumatic; Eyes: EOMI, PERRL, No scleral icterus; ENMT: Mouth and pharynx normal, Mucous membranes moist; Neck: Supple, Full range of motion, No lymphadenopathy; Cardiovascular: Regular rate and rhythm, No gallop; Respiratory: Breath sounds clear & equal bilaterally, No wheezes.  Speaking full sentences with ease, Normal respiratory effort/excursion; Chest: Nontender, Movement normal; Abdomen: Soft, +mid-epigastric tenderness to palp. No rebound or guarding. Nondistended, Normal bowel sounds; Genitourinary:  No CVA tenderness; Extremities: Pulses normal, No tenderness, No edema, No calf edema or asymmetry.; Neuro:  AA&Ox3, Major CN grossly intact.  Speech clear. No gross focal motor or sensory deficits in extremities.; Skin: Color normal, Warm, Dry.; Psych:  Anxious.   ED Treatments / Results  Labs (all labs ordered are listed, but only abnormal results are displayed)   EKG  EKG Interpretation  Date/Time:  Wednesday January 10 2018 12:19:46 EST Ventricular Rate:  113 PR Interval:  126 QRS Duration: 74 QT Interval:  334 QTC Calculation: 458 R Axis:   91 Text Interpretation:  Sinus tachycardia Biatrial enlargement Rightward axis Abnormal ECG rate is faster compared to July 2017 Confirmed by Sherwood Gambler [phone]) on 01/10/2018 1:09:28 PM       Radiology   Procedures Procedures (including critical care time)  Medications Ordered in ED Medications - No data to display   Initial Impression / Assessment and Plan / ED Course  I have reviewed the triage vital signs and the nursing notes.  Pertinent labs & imaging results that were available during my care of the patient were reviewed by me and considered in my medical decision making (see chart for details).  MDM Reviewed: previous chart, nursing note and vitals Reviewed previous: labs and ECG Interpretation: labs, ECG and x-ray    Results for orders placed or performed during the hospital encounter of 01/10/18  CBC with Differential  Result Value Ref Range   WBC 11.8 (H) 4.0 - 10.5 K/uL   RBC 4.52 3.87 - 5.11 MIL/uL   Hemoglobin 13.0 12.0 - 15.0 g/dL   HCT 42.0 36.0 - 46.0 %   MCV 92.9 78.0 - 100.0 fL   MCH 28.8 26.0 - 34.0 pg   MCHC 31.0 30.0 - 36.0 g/dL   RDW 14.6 11.5 - 15.5 %   Platelets 539 (H) 150 - 400 K/uL   Neutrophils Relative % 58 %   Neutro Abs 6.9 1.7 - 7.7 K/uL   Lymphocytes Relative 30 %   Lymphs Abs 3.6 0.7 - 4.0 K/uL   Monocytes Relative 9 %   Monocytes Absolute 1.0 0.1 - 1.0 K/uL   Eosinophils  Relative 3 %   Eosinophils Absolute 0.3 0.0 - 0.7 K/uL   Basophils Relative 0 %   Basophils Absolute 0.0 0.0 - 0.1 K/uL  Basic metabolic panel  Result Value Ref Range   Sodium 133 (L) 135 - 145 mmol/L   Potassium 4.2 3.5 - 5.1 mmol/L   Chloride 100 (L) 101 - 111 mmol/L   CO2 23 22 - 32 mmol/L   Glucose, Bld 92 65 - 99 mg/dL   BUN 5 (L) 6 - 20 mg/dL   Creatinine, Ser 0.57 0.44 - 1.00 mg/dL   Calcium 9.7 8.9 - 10.3 mg/dL   GFR calc non Af Amer >60 >60 mL/min   GFR calc Af Amer >60 >60 mL/min   Anion gap 10 5 - 15  Magnesium  Result Value Ref Range   Magnesium 1.7 1.7 - 2.4 mg/dL  Troponin I  Result Value Ref Range   Troponin I <0.03 <0.03 ng/mL  D-dimer, quantitative  Result Value Ref Range   D-Dimer, Quant 0.27 0.00 - 0.50 ug/mL-FEU  Lipase, blood  Result Value Ref Range   Lipase 25 11 - 51 U/L  Hepatic function panel  Result Value Ref Range   Total Protein 8.1 6.5 - 8.1 g/dL   Albumin 4.0 3.5 - 5.0 g/dL   AST 25 15 - 41 U/L   ALT 16 14 - 54 U/L   Alkaline Phosphatase 90 38 - 126  U/L   Total Bilirubin 0.4 0.3 - 1.2 mg/dL   Bilirubin, Direct <0.1 (L) 0.1 - 0.5 mg/dL   Indirect Bilirubin NOT CALCULATED 0.3 - 0.9 mg/dL   Dg Chest 2 View Result Date: 01/10/2018 CLINICAL DATA:  Tachycardia EXAM: CHEST  2 VIEW COMPARISON:  Two-view chest x-ray 12/22/2017 FINDINGS: The heart size is normal. There is no edema or effusion. No focal airspace disease is present. Mild degenerative changes are noted in the thoracic spine. IMPRESSION: Negative two view chest x-ray Electronically Signed   By: San Morelle M.D.   On: 01/10/2018 16:00    1705:  Pt refused to give urine sample. Told ED RN that she was leaving and walked out of the ED.        Final Clinical Impressions(s) / ED Diagnoses   Final diagnoses:  None    ED Discharge Orders    None        Francine Graven, DO 01/10/18 1728

## 2018-01-10 NOTE — ED Triage Notes (Signed)
Patient reports she woke up with a fast heart rate, 170 at home.

## 2018-01-10 NOTE — ED Notes (Signed)
Pt states she doesn't want to wait any longer and wants to check out. Informed of waiting on tests results and needing a urine test. "I am ready to go, ya'll haven't done anything for me." pt left in NAD walking out of department in steady gait.

## 2018-01-11 ENCOUNTER — Other Ambulatory Visit: Payer: Self-pay | Admitting: Adult Health

## 2018-01-11 ENCOUNTER — Telehealth (INDEPENDENT_AMBULATORY_CARE_PROVIDER_SITE_OTHER): Payer: Self-pay | Admitting: *Deleted

## 2018-01-11 MED ORDER — BUTALBITAL-APAP-CAFFEINE 50-325-40 MG PO TABS: 1.0000 | ORAL_TABLET | Freq: Four times a day (QID) | ORAL | 1 refills | Status: DC | PRN

## 2018-01-11 NOTE — Telephone Encounter (Signed)
I advised her to stop the dicyclomine. Please let Dr. Laural Golden

## 2018-01-11 NOTE — Telephone Encounter (Signed)
Patient called stating the dicyclomine has made her heart rate go up to bad, patient states she has to go to the ER about it. Please advise

## 2018-01-11 NOTE — Telephone Encounter (Signed)
Agree with plans to stop dicyclomine.

## 2018-01-11 NOTE — Telephone Encounter (Signed)
Terri ask that Dr.Rehman be made aware. Forwarded to [place].

## 2018-01-11 NOTE — Progress Notes (Signed)
Requests refill on Fioricet

## 2018-01-15 ENCOUNTER — Encounter: Payer: Self-pay | Admitting: Adult Health

## 2018-01-15 ENCOUNTER — Ambulatory Visit: Payer: Medicaid Other | Admitting: Adult Health

## 2018-01-15 VITALS — BP 98/54 | HR 76 | Ht 60.0 in | Wt 116.0 lb

## 2018-01-15 DIAGNOSIS — Z8619 Personal history of other infectious and parasitic diseases: Secondary | ICD-10-CM | POA: Diagnosis not present

## 2018-01-15 DIAGNOSIS — F329 Major depressive disorder, single episode, unspecified: Secondary | ICD-10-CM

## 2018-01-15 DIAGNOSIS — F419 Anxiety disorder, unspecified: Secondary | ICD-10-CM | POA: Diagnosis not present

## 2018-01-15 DIAGNOSIS — Z113 Encounter for screening for infections with a predominantly sexual mode of transmission: Secondary | ICD-10-CM

## 2018-01-15 DIAGNOSIS — F32A Depression, unspecified: Secondary | ICD-10-CM

## 2018-01-15 MED ORDER — DOXYCYCLINE HYCLATE 100 MG PO CAPS: 100.0000 mg | ORAL_CAPSULE | Freq: Two times a day (BID) | ORAL | 1 refills | Status: DC

## 2018-01-15 MED ORDER — LORAZEPAM 0.5 MG PO TABS: ORAL_TABLET | ORAL | 1 refills | Status: DC

## 2018-01-15 NOTE — Progress Notes (Signed)
Subjective:     Patient ID: Julie [name], female   DOB: Apr 04, 1971, 47 y.o.   MRN: [id number]  HPI Julie [name] is a 47 year old white female in for proof of cure for recent trich. Had seizure 12/22/17 and seen in ER to see [facility] in April, had seizure 15 years ago.   Review of Systems +headache like sinus trouble today but has headaches  Eating better less anxiety with ativan No discharge Reviewed past medical,surgical, social and family history. Reviewed medications and allergies.     Objective:   Physical Exam BP (!) 98/54 (BP Location: Left Arm, Patient Position: Sitting, Cuff Size: Normal)   Pulse 76   Ht 5' (1.524 m)   Wt 116 lb (52.6 kg)   BMI 22.65 kg/m  Skin warm and dry.Pelvic: external genitalia is normal in appearance no lesions, vagina: white discharge without odor,urethra has no lesions or masses noted, cervix:smooth and bulbous, uterus: normal size, shape and contour, non tender, no masses felt, adnexa: no masses or tenderness noted. Bladder is non tender and no masses felt. Nuswab obtained.     Assessment:     1. History of trichomoniasis   2. Screening examination for STD (sexually transmitted disease)   3. Anxiety and depression       Plan:     Nuswab sent Meds ordered this encounter  Medications  . doxycycline (VIBRAMYCIN) 100 MG capsule    Sig: Take 1 capsule (100 mg total) by mouth 2 (two) times daily.    Dispense:  20 capsule    Refill:  1    Order Specific Question:   Supervising Provider    Answer:   EURE, LUTHER H [[id number]]  . LORazepam (ATIVAN) 0.5 MG tablet    Sig: Take 1 bid prn anxiety    Dispense:  30 tablet    Refill:  1    Order Specific Question:   Supervising Provider    Answer:   Florian Buff [[id number]]  Try call [facility] in [place],has number [facility] given by [place] as they are not seeing adults at present. Keep appt with [facility]

## 2018-01-18 ENCOUNTER — Ambulatory Visit (INDEPENDENT_AMBULATORY_CARE_PROVIDER_SITE_OTHER): Payer: Self-pay | Admitting: Internal Medicine

## 2018-01-18 ENCOUNTER — Telehealth: Payer: Self-pay | Admitting: Adult Health

## 2018-01-18 LAB — NUSWAB VAGINITIS PLUS (VG+)
ATOPOBIUM VAGINAE: HIGH {score} — AB
BVAB 2: HIGH Score — AB
CHLAMYDIA TRACHOMATIS, NAA: NEGATIVE
Candida albicans, NAA: POSITIVE — AB
Candida glabrata, NAA: POSITIVE — AB
Neisseria gonorrhoeae, NAA: NEGATIVE
Trich vag by NAA: POSITIVE — AB

## 2018-01-18 MED ORDER — FLUCONAZOLE 150 MG PO TABS: ORAL_TABLET | ORAL | 0 refills | Status: DC

## 2018-01-18 MED ORDER — TINIDAZOLE 500 MG PO TABS: ORAL_TABLET | ORAL | 0 refills | Status: DC

## 2018-01-18 NOTE — Telephone Encounter (Signed)
Left message +trich, yeast and BV on nuswab negative for GC/Chalamyida, will rx tindamax and difucan and no sex or alcohol needs appt in 10 days for POC

## 2018-01-23 ENCOUNTER — Ambulatory Visit (INDEPENDENT_AMBULATORY_CARE_PROVIDER_SITE_OTHER): Payer: Medicaid Other | Admitting: Internal Medicine

## 2018-01-23 ENCOUNTER — Encounter (INDEPENDENT_AMBULATORY_CARE_PROVIDER_SITE_OTHER): Payer: Self-pay | Admitting: Internal Medicine

## 2018-01-23 VITALS — BP 140/82 | HR 68 | Temp 98.3°F | Ht 60.0 in | Wt 117.8 lb

## 2018-01-23 DIAGNOSIS — R1013 Epigastric pain: Secondary | ICD-10-CM | POA: Diagnosis not present

## 2018-01-23 DIAGNOSIS — K625 Hemorrhage of anus and rectum: Secondary | ICD-10-CM

## 2018-01-23 MED ORDER — HYOSCYAMINE SULFATE 0.125 MG SL SUBL: 0.1250 mg | SUBLINGUAL_TABLET | SUBLINGUAL | 0 refills | Status: DC | PRN

## 2018-01-23 NOTE — Patient Instructions (Signed)
CBC today. Rx for Levsin sent to her pharmacy

## 2018-01-23 NOTE — Progress Notes (Signed)
Subjective:    Patient ID: Julie [name], female    DOB: [birth year], 47 y.o.   MRN: [id number]  HPI Here today for f/u. She was last seen in January of this year with epigastric pain. She was started on Dicyclomine but could not take. Her last EGD was in 2015 She says she continues to have some epigastric pain. She says it will occur at least once a day. The pain may last day and sometimes it will resolved after a few hrs.  She has had this pain for a long time.  She has a BM 3-4 times a day. Stools are formed and sometimes they are not.  Hx of same.  She says she has seen some blood mixed in her stool 1 week ago. Her appetite is okay. She has maintained her weight.  Does not taking any NSAIDs. Her last colonoscopy was in November of 2017 Personal hx of colonic polyps Entire examined colon: normal. External hemorrhoids.   3/24/2015EGD Indications: Patient is a 47 year old Caucasian female who has chronic diarrhea felt to be secondary to IBS unresponsible therapy. She was screened for celiac disease. Tissue trueness glutaminase antibody IgA and gliadin antibody IgG are both elevated. She is therefore undergoing EGD with duodenal biopsy. She also has chronic GERD and maintained on PPI. Impression: Small sliding-type hernia without evidence of esophagitis. Small amount of food debris in the stomach with patent pylorus and no evidence of peptic ulcer disease. No endoscopic changes of celiac disease noted involving bulbar and post bulbar mucosa. Biopsy taken. Biopsy was negative for Celiac disease.    Review of Systems Past Medical History:  Diagnosis Date  . Anger   . Anxiety   . Aortic insufficiency 2006   Noted at heart cath - mild to moderate  . Arthritis   . Asthma   . Back pain   . BV (bacterial vaginosis) 05/28/2015  . Chronic diarrhea   . Chronic  headache   . Depression   . Diarrhea 05/28/2015  . Dyslipidemia 01/19/2016  . GERD (gastroesophageal reflux disease)   . History of kidney stones    history of  . History of nuclear stress test 07/15/2008   lexiscan; mild-mod perfusion defect in mid anteroseptal, apical anterior, apical septal regions (attenuation artifiact), prominent gut uptake activity in infero-apical region; post-stress EF 64%; EKG negative for ischemia; patient experienced CP during test  . Hx of cardiac catheterization 2006   no significant CAD  . IBS (irritable bowel syndrome)   . Irritable bowel syndrome   . Nausea 05/28/2015  . Neck pain   . Numerous moles 01/18/2016  . RLQ abdominal pain 05/28/2015  . Seizures ([place])    2 years ago; unknown etiology and none since then and on no meds  . Vaginal discharge 05/28/2015  . Weight gain 01/18/2016    Past Surgical History:  Procedure Laterality Date  . BIOPSY N/A 02/25/2014   Procedure: BIOPSY;  Surgeon: Rogene Houston, MD;  [facility];  Service: Endoscopy;  Laterality: N/A;  . BREAST ENHANCEMENT SURGERY    . CARDIAC CATHETERIZATION  1025//2006   no significant CAD, mild-mod depressed LV systolic function, EF 40-98%, mild-mod AI (Dr. Gerrie Nordmann)   . CARPAL TUNNEL RELEASE Bilateral 01/01/2016  . CESAREAN SECTION    . COLONOSCOPY  05/27/2011   snare polypectomy   . COLONOSCOPY WITH PROPOFOL N/A 10/21/2016   Procedure: COLONOSCOPY WITH PROPOFOL;  Surgeon: Rogene Houston, MD;  [facility]: [facility];  Service: Endoscopy;  Laterality: N/A;  10:30  . ESOPHAGOGASTRODUODENOSCOPY N/A 02/25/2014   Procedure: ESOPHAGOGASTRODUODENOSCOPY (EGD);  Surgeon: Rogene Houston, MD;  [facility];  Service: Endoscopy;  Laterality: N/A;  730  . KNEE SURGERY Right   . TRANSTHORACIC ECHOCARDIOGRAM  02/7105   LV systolic function is low-normal; RV is normal in size & function; mild MR, mild TR  . TUBAL LIGATION      Allergies  Allergen Reactions  . Dicyclomine  Palpitations    Patient states that her heart rate will go real fast when she takes this medication.  . Flagyl [Metronidazole Hcl] Nausea And Vomiting  . Penicillins Other (See Comments)    Caused patient to pass out Has patient had a PCN reaction causing immediate rash, facial/tongue/throat swelling, SOB or lightheadedness with hypotension: unknown Has patient had a PCN reaction causing severe rash involving mucus membranes or skin necrosis: No Has patient had a PCN reaction that required hospitalization No Has patient had a PCN reaction occurring within the last 10 years: No If all of the above answers are "NO", then may proceed with Cephalosporin use.      Current Outpatient Medications on File Prior to Visit  Medication Sig Dispense Refill  . albuterol (PROVENTIL HFA;VENTOLIN HFA) 108 (90 Base) MCG/ACT inhaler Inhale 2 puffs into the lungs every 6 (six) hours as needed for wheezing or shortness of breath. 1 Inhaler 2  . butalbital-acetaminophen-caffeine (FIORICET, ESGIC) 50-325-40 MG tablet Take 1 tablet by mouth every 6 (six) hours as needed for headache. 30 tablet 1  . cetirizine (ZYRTEC) 10 MG tablet Take 10 mg by mouth daily.    . Cholecalciferol 5000 units capsule Take 1 capsule (5,000 Units total) by mouth daily.    . citalopram (CELEXA) 40 MG tablet Take 1 tablet (40 mg total) by mouth daily. 30 tablet 6  . doxycycline (VIBRAMYCIN) 100 MG capsule Take 1 capsule (100 mg total) by mouth 2 (two) times daily. 20 capsule 1  . fluconazole (DIFLUCAN) 150 MG tablet Take 1 now and 1 in 3 days 2 tablet 0  . LORazepam (ATIVAN) 0.5 MG tablet Take 1 bid prn anxiety 30 tablet 1  . ondansetron (ZOFRAN) 8 MG tablet Take 1 tablet (8 mg total) by mouth every 8 (eight) hours as needed for nausea or vomiting. 20 tablet 0  . pantoprazole (PROTONIX) 20 MG tablet TAKE (1) TABLET TWICE A DAY BEFORE MEALS. 60 tablet 3  . progesterone (PROMETRIUM) 200 MG capsule Take 1 capsule (200 mg total) by mouth  daily. 30 capsule 6  . tinidazole (TINDAMAX) 500 MG tablet Take 4 now and 4 tomorrow 8 tablet 0   No current facility-administered medications on file prior to visit.         Objective:   Physical Exam Blood pressure 140/82, pulse 68, temperature 98.3 F (36.8 C), height 5' (1.524 m), weight 117 lb 12.8 oz (53.4 kg).  Alert and oriented. Skin warm and dry. Oral mucosa is moist.   . Sclera anicteric, conjunctivae is pink. Thyroid not enlarged. No cervical lymphadenopathy. Lungs clear. Heart regular rate and rhythm.  Abdomen is soft. Bowel sounds are positive. No hepatomegaly. No abdominal masses felt. Epigastric  tenderness.  No edema to lower extremities.  Stool brown and guaiac negative.        Assessment & Plan:  Epigastric pain. Will try her on Levsin to see how she does. Rectal bleeding: will get a CBC today.  Colonoscopy UTD in 2017. 2 stool  cards sent home with patient.

## 2018-02-02 NOTE — Progress Notes (Signed)
Please place orders in Epic as patient is being scheduled for a pre-op appointment! Thank you! 

## 2018-02-07 ENCOUNTER — Other Ambulatory Visit: Payer: Self-pay | Admitting: Adult Health

## 2018-02-08 ENCOUNTER — Ambulatory Visit: Payer: Self-pay | Admitting: Orthopedic Surgery

## 2018-02-16 ENCOUNTER — Other Ambulatory Visit (INDEPENDENT_AMBULATORY_CARE_PROVIDER_SITE_OTHER): Payer: Self-pay | Admitting: Internal Medicine

## 2018-02-26 ENCOUNTER — Encounter (INDEPENDENT_AMBULATORY_CARE_PROVIDER_SITE_OTHER): Payer: Self-pay | Admitting: *Deleted

## 2018-02-26 ENCOUNTER — Other Ambulatory Visit (INDEPENDENT_AMBULATORY_CARE_PROVIDER_SITE_OTHER): Payer: Self-pay | Admitting: *Deleted

## 2018-02-26 DIAGNOSIS — K625 Hemorrhage of anus and rectum: Secondary | ICD-10-CM

## 2018-02-27 ENCOUNTER — Other Ambulatory Visit: Payer: Self-pay | Admitting: Adult Health

## 2018-02-28 ENCOUNTER — Inpatient Hospital Stay (HOSPITAL_COMMUNITY): Admission: RE | Admit: 2018-02-28 | Payer: Self-pay | Source: Ambulatory Visit

## 2018-03-01 ENCOUNTER — Telehealth: Payer: Self-pay | Admitting: Adult Health

## 2018-03-01 NOTE — Patient Instructions (Addendum)
Julie [name]  03/01/2018   Your procedure is scheduled on: 03-07-18   Report to [facility] Main  Entrance Report to Admitting at 2:45 PM   Call this number if you have problems the morning of surgery [phone]    Remember: Do not eat food or drink liquids :After Midnight. You may have a Clear Liquid Diet from Midnight from 11:15 AM. After 11:15 AM, nothing until after surgery.     CLEAR LIQUID DIET   Foods Allowed                                                                     Foods Excluded  Coffee and tea, regular and decaf                             liquids that you cannot  Plain Jell-O in any flavor                                             see through such as: Fruit ices (not with fruit pulp)                                     milk, soups, orange juice  Iced Popsicles                                    All solid food Carbonated beverages, regular and diet                                    Cranberry, grape and apple juices Sports drinks like Gatorade Lightly seasoned clear broth or consume(fat free) Sugar, honey syrup  Sample Menu Breakfast                                Lunch                                     Supper Cranberry juice                    Beef broth                            Chicken broth Jell-O                                     Grape juice                           Apple  juice Coffee or tea                        Jell-O                                      Popsicle                                                Coffee or tea                        Coffee or tea  _____________________________________________________________________    Take these medicines the morning of surgery with A SIP OF WATER: Citalopram (Celexa),Pantoprazole (Protonix), Cetirizine (Zyrtec). You can also bring and use your nasal spray as needed.                                You may not have any metal on your body including hair pins and               piercings  Do not wear jewelry, make-up, lotions, powders or perfumes, deodorant             Do not wear nail polish.  Do not shave  48 hours prior to surgery.              Do not bring valuables to the hospital. [place].  Contacts, dentures or bridgework may not be worn into surgery.  Leave suitcase in the car. After surgery it may be brought to your room.                 Please read over the following fact sheets you were given: _____________________________________________________________________          [facility] - Preparing for Surgery Before surgery, you can play an important role.  Because skin is not sterile, your skin needs to be as free of germs as possible.  You can reduce the number of germs on your skin by washing with CHG (chlorahexidine gluconate) soap before surgery.  CHG is an antiseptic cleaner which kills germs and bonds with the skin to continue killing germs even after washing. Please DO NOT use if you have an allergy to CHG or antibacterial soaps.  If your skin becomes reddened/irritated stop using the CHG and inform your nurse when you arrive at Short Stay. Do not shave (including legs and underarms) for at least 48 hours prior to the first CHG shower.  You may shave your face/neck. Please follow these instructions carefully:  1.  Shower with CHG Soap the night before surgery and the  morning of Surgery.  2.  If you choose to wash your hair, wash your hair first as usual with your  normal  shampoo.  3.  After you shampoo, rinse your hair and body thoroughly to remove the  shampoo.                           4.  Use CHG as you would any  other liquid soap.  You can apply chg directly  to the skin and wash                       Gently with a scrungie or clean washcloth.  5.  Apply the CHG Soap to your body ONLY FROM THE NECK DOWN.   Do not use on face/ open                           Wound or open sores. Avoid  contact with eyes, ears mouth and genitals (private parts).                       Wash face,  Genitals (private parts) with your normal soap.             6.  Wash thoroughly, paying special attention to the area where your surgery  will be performed.  7.  Thoroughly rinse your body with warm water from the neck down.  8.  DO NOT shower/wash with your normal soap after using and rinsing off  the CHG Soap.                9.  Pat yourself dry with a clean towel.            10.  Wear clean pajamas.            11.  Place clean sheets on your bed the night of your first shower and do not  sleep with pets. Day of Surgery : Do not apply any lotions/deodorants the morning of surgery.  Please wear clean clothes to the hospital/surgery center.  FAILURE TO FOLLOW THESE INSTRUCTIONS MAY RESULT IN THE CANCELLATION OF YOUR SURGERY PATIENT SIGNATURE_________________________________  NURSE SIGNATURE__________________________________  ________________________________________________________________________   Julie [name]  An incentive spirometer is a tool that can help keep your lungs clear and active. This tool measures how well you are filling your lungs with each breath. Taking long deep breaths may help reverse or decrease the chance of developing breathing (pulmonary) problems (especially infection) following:  A long period of time when you are unable to move or be active. BEFORE THE PROCEDURE   If the spirometer includes an indicator to show your best effort, your nurse or respiratory therapist will set it to a desired goal.  If possible, sit up straight or lean slightly forward. Try not to slouch.  Hold the incentive spirometer in an upright position. INSTRUCTIONS FOR USE  1. Sit on the edge of your bed if possible, or sit up as far as you can in bed or on a chair. 2. Hold the incentive spirometer in an upright position. 3. Breathe out normally. 4. Place the mouthpiece in your mouth  and seal your lips tightly around it. 5. Breathe in slowly and as deeply as possible, raising the piston or the ball toward the top of the column. 6. Hold your breath for 3-5 seconds or for as long as possible. Allow the piston or ball to fall to the bottom of the column. 7. Remove the mouthpiece from your mouth and breathe out normally. 8. Rest for a few seconds and repeat Steps 1 through 7 at least 10 times every 1-2 hours when you are awake. Take your time and take a few normal breaths between deep breaths. 9. The spirometer may include an indicator to show your best effort. Use the indicator as  a goal to work toward during each repetition. 10. After each set of 10 deep breaths, practice coughing to be sure your lungs are clear. If you have an incision (the cut made at the time of surgery), support your incision when coughing by placing a pillow or rolled up towels firmly against it. Once you are able to get out of bed, walk around indoors and cough well. You may stop using the incentive spirometer when instructed by your caregiver.  RISKS AND COMPLICATIONS  Take your time so you do not get dizzy or light-headed.  If you are in pain, you may need to take or ask for pain medication before doing incentive spirometry. It is harder to take a deep breath if you are having pain. AFTER USE  Rest and breathe slowly and easily.  It can be helpful to keep track of a log of your progress. Your caregiver can provide you with a simple table to help with this. If you are using the spirometer at home, follow these instructions: [place] IF:   You are having difficultly using the spirometer.  You have trouble using the spirometer as often as instructed.  Your pain medication is not giving enough relief while using the spirometer.  You develop fever of 100.5 F (38.1 C) or higher. SEEK IMMEDIATE MEDICAL CARE IF:   You cough up bloody sputum that had not been present before.  You develop  fever of 102 F (38.9 C) or greater.  You develop worsening pain at or near the incision site. MAKE SURE YOU:   Understand these instructions.  Will watch your condition.  Will get help right away if you are not doing well or get worse. Document Released: 04/03/2007 Document Revised: 02/13/2012 Document Reviewed: 06/04/2007 Exit[employer] Patient Information 2014 Exit[employer], [place].   ________________________________________________________________________  WHAT IS A BLOOD TRANSFUSION? Blood Transfusion Information  A transfusion is the replacement of blood or some of its parts. Blood is made up of multiple cells which provide different functions.  Red blood cells carry oxygen and are used for blood loss replacement.  White blood cells fight against infection.  Platelets control bleeding.  Plasma helps clot blood.  Other blood products are available for specialized needs, such as hemophilia or other clotting disorders. BEFORE THE TRANSFUSION  Who gives blood for transfusions?   Healthy volunteers who are fully evaluated to make sure their blood is safe. This is blood bank blood. Transfusion therapy is the safest it has ever been in the practice of medicine. Before blood is taken from a donor, a complete history is taken to make sure that person has no history of diseases nor engages in risky social behavior (examples are intravenous drug use or sexual activity with multiple partners). The donor's travel history is screened to minimize risk of transmitting infections, such as malaria. The donated blood is tested for signs of infectious diseases, such as HIV and hepatitis. The blood is then tested to be sure it is compatible with you in order to minimize the chance of a transfusion reaction. If you or a relative donates blood, this is often done in anticipation of surgery and is not appropriate for emergency situations. It takes many days to process the donated blood. RISKS AND  COMPLICATIONS Although transfusion therapy is very safe and saves many lives, the main dangers of transfusion include:   Getting an infectious disease.  Developing a transfusion reaction. This is an allergic reaction to something in the blood you were given.  Every precaution is taken to prevent this. The decision to have a blood transfusion has been considered carefully by your caregiver before blood is given. Blood is not given unless the benefits outweigh the risks. AFTER THE TRANSFUSION  Right after receiving a blood transfusion, you will usually feel much better and more energetic. This is especially true if your red blood cells have gotten low (anemic). The transfusion raises the level of the red blood cells which carry oxygen, and this usually causes an energy increase.  The nurse administering the transfusion will monitor you carefully for complications. HOME CARE INSTRUCTIONS  No special instructions are needed after a transfusion. You may find your energy is better. Speak with your caregiver about any limitations on activity for underlying diseases you may have. SEEK MEDICAL CARE IF:   Your condition is not improving after your transfusion.  You develop redness or irritation at the intravenous (IV) site. SEEK IMMEDIATE MEDICAL CARE IF:  Any of the following symptoms occur over the next 12 hours:  Shaking chills.  You have a temperature by mouth above 102 F (38.9 C), not controlled by medicine.  Chest, back, or muscle pain.  People around you feel you are not acting correctly or are confused.  Shortness of breath or difficulty breathing.  Dizziness and fainting.  You get a rash or develop hives.  You have a decrease in urine output.  Your urine turns a dark color or changes to pink, red, or brown. Any of the following symptoms occur over the next 10 days:  You have a temperature by mouth above 102 F (38.9 C), not controlled by medicine.  Shortness of  breath.  Weakness after normal activity.  The white part of the eye turns yellow (jaundice).  You have a decrease in the amount of urine or are urinating less often.  Your urine turns a dark color or changes to pink, red, or brown. Document Released: 11/18/2000 Document Revised: 02/13/2012 Document Reviewed: 07/07/2008 [facility] Patient Information 2014 [place], [place].  _______________________________________________________________________

## 2018-03-01 NOTE — Progress Notes (Addendum)
02-06-18 Surgical clearance on chart.  01-11-18 (Epic) EKG  01-10-18 (Epic) CXR  02-24-14 (Epic) LOV with Cardiology, F/u prn

## 2018-03-01 NOTE — Telephone Encounter (Signed)
Patient called stating that her pharmacy has sent over a prescription for her medication and she would like Anderson Malta to fill them today. I informed the patient that Anderson Malta is not in the office today and wont be back in until 03/05/2018. Pt would like to know if another provider could go ahead and fill them for her. Please contact pt.

## 2018-03-02 ENCOUNTER — Other Ambulatory Visit: Payer: Self-pay

## 2018-03-02 ENCOUNTER — Encounter (HOSPITAL_COMMUNITY)
Admission: RE | Admit: 2018-03-02 | Discharge: 2018-03-02 | Disposition: A | Discharge status: Home / Self Care | Payer: Medicaid Other | Source: Ambulatory Visit | Attending: Orthopedic Surgery | Admitting: Orthopedic Surgery

## 2018-03-02 ENCOUNTER — Encounter (HOSPITAL_COMMUNITY): Payer: Self-pay

## 2018-03-02 DIAGNOSIS — Z01812 Encounter for preprocedural laboratory examination: Secondary | ICD-10-CM | POA: Insufficient documentation

## 2018-03-02 DIAGNOSIS — M1711 Unilateral primary osteoarthritis, right knee: Secondary | ICD-10-CM | POA: Diagnosis not present

## 2018-03-02 LAB — CBC
HEMATOCRIT: 35.3 % — AB (ref 36.0–46.0)
HEMOGLOBIN: 11.2 g/dL — AB (ref 12.0–15.0)
MCH: 29.5 pg (ref 26.0–34.0)
MCHC: 31.7 g/dL (ref 30.0–36.0)
MCV: 92.9 fL (ref 78.0–100.0)
Platelets: 426 10*3/uL — ABNORMAL HIGH (ref 150–400)
RBC: 3.8 MIL/uL — AB (ref 3.87–5.11)
RDW: 14.5 % (ref 11.5–15.5)
WBC: 13 10*3/uL — ABNORMAL HIGH (ref 4.0–10.5)

## 2018-03-02 LAB — COMPREHENSIVE METABOLIC PANEL
ALK PHOS: 85 U/L (ref 38–126)
ALT: 12 U/L — AB (ref 14–54)
AST: 19 U/L (ref 15–41)
Albumin: 4.1 g/dL (ref 3.5–5.0)
Anion gap: 10 (ref 5–15)
BILIRUBIN TOTAL: 0.4 mg/dL (ref 0.3–1.2)
BUN: 5 mg/dL — AB (ref 6–20)
CALCIUM: 9.2 mg/dL (ref 8.9–10.3)
CHLORIDE: 99 mmol/L — AB (ref 101–111)
CO2: 27 mmol/L (ref 22–32)
CREATININE: 0.6 mg/dL (ref 0.44–1.00)
GFR calc Af Amer: >60 mL/min (ref >60)
Glucose, Bld: 94 mg/dL (ref 65–99)
Potassium: 3.5 mmol/L (ref 3.5–5.1)
Sodium: 136 mmol/L (ref 135–145)
Total Protein: 8 g/dL (ref 6.5–8.1)

## 2018-03-02 LAB — SURGICAL PCR SCREEN
MRSA, PCR: POSITIVE — AB
STAPHYLOCOCCUS AUREUS: POSITIVE — AB

## 2018-03-02 LAB — PROTIME-INR
INR: 0.97
PROTHROMBIN TIME: 12.8 s (ref 11.4–15.2)

## 2018-03-02 LAB — PREGNANCY, URINE: PREG TEST UR: NEGATIVE

## 2018-03-02 LAB — ABO/RH: ABO/RH(D): O POS

## 2018-03-02 LAB — APTT: aPTT: 25 seconds (ref 24–36)

## 2018-03-02 NOTE — Progress Notes (Signed)
03-02-18 PCR result routed to Dr. Wynelle Link for review

## 2018-03-02 NOTE — Telephone Encounter (Signed)
Patient informed that it is too early for any refills but that she also needs POC. Appt made.

## 2018-03-04 ENCOUNTER — Ambulatory Visit: Payer: Self-pay | Admitting: Orthopedic Surgery

## 2018-03-04 NOTE — H&P (View-Only) (Signed)
Julie [name], Julie [name] (47yo, F)  DOB 03-Aug-1971   Chief Complaint Right Knee Pain H&P right UKA 03-07-2018  Patient's Care Team Primary Care Provider: Glenda Chroman: [address] [place], [place] [address], Ph [phone], Fax [phone] NPI: [id number] Patient's Pharmacies [facility]): [address] [place], [place] [address], Ph [phone], Fax [phone]   Vitals Ht: 5 ft 1 in  Wt: 119 lbs BMI: 22.5  BP: 104/56  Pulse: 76 bpm  Allergies Reviewed Allergies FLAGYL  HOUSE DUST: Eye redness (Moderate to severe)  MORPHINE: Headache (Severe)  PENICILLINS: Confusion (Severe)    Medications Reviewed Medications Acid Control (ranitidine) 02/20/18   entered Loura Back All Day Allergy (cetirizine) 10 mg tablet 02/20/18   entered Faith Brown Ativan 0.5 mg tablet AS NEEDED 02/20/18   entered Bertie Mcconathy, PA-C CeleXA 40 mg tablet 02/20/18   entered [employer] Fioricet 01/26/18   entered [employer] Protonix 01/26/18   entered Loura Back   Problems Reviewed Problems Osteoarthritis of right knee joint   Family History None Documented  Social History Reviewed Social History Smoking Status: Current every day smoker Smoker (1/2 PPD) Tobacco-years of use: 30 Occupation: Other Chewing tobacco: none Alcohol intake: Occasional Hand Dominance: Right Work related injury?: N Exercise level: None Obese: N Advance directive: N Medical Power of Attorney: N Live alone or with others?: with others Marital status: Single   Surgical History Reviewed Surgical History Release of trigger thumb Knee Arthroscopy Breast implantation Knee Surgery - 12/06/2015 Knee Surgery - 12/05/2013    Past Medical History Reviewed Past Medical History Anxiety Disorder: Y Depression: Y Excessive Thirst: Y GERD/Reflux: Y GI Issues Specify:: Y Headaches: Y Heart Problems: Y Irregular Heartbeat: Y Joint Pain: Y Osteoarthritis: Y Previous Cortisone  Injection(s): Y Previous Fracture(s): Y Seizures/Epilepsy: Y    HPI The patient is here today for a pre-operative History and Physical. They are scheduled for right unicompartmental knee replacement on 03-07-2018 with Dr. Wynelle Link at [facility]. Ms. Boivin has been seen for ongoing right knee pain. She had a knee scope in 2016. She then had another knee scope in 2018. She most recently had a cortisone injection in May 2018. She states that she feels like the injections hurt more than they help. She reports that she has had continued increased pain and swelling in her knee. She has some stiffness in the knee, and occasionally gives out at times. She has taken Oxycodone 7.5/325 as needed for pain. She has had problems with the knee for several years now that started with a fall back in 2016 that lead to tears in the medial right knee requiring a knee arthroscopy. She was treated with injections following the surgery but continued to have progressive pain and issues with that knee. She eventually had to have a second knee arthroscopy. Since then, the knee continues to get worse. She describes pain at rest but worse with weight bearing and at night. The knee swells and has popping and buckling sensations. Fortunately, it is not locking or catching with her. Ice helps along with heat. She has not done any topical rubs. She has difficulty getting up and down steps, getting in and out of the car. It's very stiff after sitting and first thing in the morning. She does have some hip and groin pain but no where near the level of the knee. She is at a point where she is ready to get the knee fixed.   ROS Constitutional:  Constitutional: no fever, chills, night sweats, or significant weight loss.  Cardiovascular: Cardiovascular: no chest pain and palpitations.  Respiratory: Respiratory: no cough or shortness of breath.  Gastrointestinal: Gastrointestinal: vomiting and nausea.  Musculoskeletal:  Musculoskeletal: Joint Pain and swelling in Joints and back pain.  Neurologic: Neurologic: no numbness, tingling, or difficulty with balance.   Physical Exam Patient is a 47 year old female.  General Mental Status - Alert, cooperative and good historian. General Appearance - pleasant, Not in acute distress. Orientation - Oriented X3. Build & Nutrition - Well nourished and Well developed. Petite Frame  Head and Neck Head - normocephalic, atraumatic . Upper Dentures Neck Global Assessment - supple, no bruit auscultated on the right, no bruit auscultated on the left.  Eye Pupil - Bilateral - PERR Motion - Bilateral - EOMI.  Chest and Lung Exam Auscultation Breath sounds - clear at anterior chest wall and clear at posterior chest wall. Adventitious sounds - No Adventitious sounds.  Cardiovascular Auscultation Rhythm - Regular rate and rhythm. Heart Sounds - S1 WNL and S2 WNL. Murmurs & Other Heart Sounds - Auscultation of the heart reveals - No Murmurs.  Abdomen Palpation/Percussion Tenderness - Abdomen is non-tender to palpation. Abdomen is soft. Auscultation Auscultation of the abdomen reveals - Bowel sounds normal.  Genitourinary Note: Not done, not pertinent to present illness  Musculoskeletal Right hip shows flexion to 120 rotation in 30 abduction 40 and external rotation of 40. There is no tenderness over the greater trochanter. There is no pain on provocative testing of the hip. Left hip shows flexion to 120 rotation in 30 out 40 and abduction 40 without discomfort. There is no tenderness over the greater trochanter. There is no pain on provocative testing of the hip. Left knee shows no effusion. Range of motion 0-125. No medial or lateral joint line tenderness. No instability. No crepitus on range of motion. RIGHT knee shows no effusion. Range of motion is 0-135. She is very tender medially. There is no lateral tenderness or instability noted. Pulses sensation and  motor are intact. Gait pattern is antalgic on the RIGHT.  Radiographs-AP and lateral of the knees show that she has significant medial joint space narrowing on the RIGHT just about bone-on-bone. Lateral and patellofemoral compartments are normal    Assessment / Plan 1. Osteoarthritis of right knee joint M17.11: Unilateral primary osteoarthritis, right knee  Patient Instructions Surgical Plans: Right Unicompartmental Knee Replacement Disposition: Home, Straight to Outpatient Therapy - [facility] in [place], [place] PCP: [facility] - seen and cleared to proceed with surgery IV TXA Anesthesia Issues: None Patient was instructed on what medications to stop prior to surgery. - Follow up visit in 2 weeks with Dr. Wynelle Link - Begin physical therapy following surgery - Pre-operative lab work as pre Pre-Surgical Testing - Prescriptions will be provided in hospital at time of discharge  Return to [place], MD for [facility] on 03/20/2018 at 03:45 PM  Encounter signed-off by Mickel Crow, PA-C

## 2018-03-04 NOTE — H&P (Signed)
MELORA, MENON (47yo, F)  DOB March 29, 1971   Chief Complaint Right Knee Pain H&P right UKA 03-07-2018  Patient's Care Team Primary Care Provider: Glenda Chroman: [address], [place], [place] [address], Ph [phone], Fax [phone] NPI: [id number] Patient's Pharmacies [facility]): [address] Carolee Rota, [place] [address], Ph [phone], Fax [phone]   Vitals Ht: 5 ft 1 in  Wt: 119 lbs BMI: 22.5  BP: 104/56  Pulse: 76 bpm  Allergies Reviewed Allergies FLAGYL  HOUSE DUST: Eye redness (Moderate to severe)  MORPHINE: Headache (Severe)  PENICILLINS: Confusion (Severe)    Medications Reviewed Medications Acid Control (ranitidine) 02/20/18   entered Loura Back All Day Allergy (cetirizine) 10 mg tablet 02/20/18   entered Faith Brown Ativan 0.5 mg tablet AS NEEDED 02/20/18   entered ALEXZANDREW PERKINS, PA-C CeleXA 40 mg tablet 02/20/18   entered [employer] Fioricet 01/26/18   entered [employer] Protonix 01/26/18   entered Loura Back   Problems Reviewed Problems Osteoarthritis of right knee joint   Family History None Documented  Social History Reviewed Social History Smoking Status: Current every day smoker Smoker (1/2 PPD) Tobacco-years of use: 30 Occupation: Other Chewing tobacco: none Alcohol intake: Occasional Hand Dominance: Right Work related injury?: N Exercise level: None Obese: N Advance directive: N Medical Power of Attorney: N Live alone or with others?: with others Marital status: Single   Surgical History Reviewed Surgical History Release of trigger thumb Knee Arthroscopy Breast implantation Knee Surgery - 12/06/2015 Knee Surgery - 12/05/2013    Past Medical History Reviewed Past Medical History Anxiety Disorder: Y Depression: Y Excessive Thirst: Y GERD/Reflux: Y GI Issues Specify:: Y Headaches: Y Heart Problems: Y Irregular Heartbeat: Y Joint Pain: Y Osteoarthritis: Y Previous Cortisone  Injection(s): Y Previous Fracture(s): Y Seizures/Epilepsy: Y    HPI The patient is here today for a pre-operative History and Physical. They are scheduled for right unicompartmental knee replacement on 03-07-2018 with Dr. Wynelle Link at [facility]. Ms. Eberly has been seen for ongoing right knee pain. She had a knee scope in 2016. She then had another knee scope in 2018. She most recently had a cortisone injection in May 2018. She states that she feels like the injections hurt more than they help. She reports that she has had continued increased pain and swelling in her knee. She has some stiffness in the knee, and occasionally gives out at times. She has taken Oxycodone 7.5/325 as needed for pain. She has had problems with the knee for several years now that started with a fall back in 2016 that lead to tears in the medial right knee requiring a knee arthroscopy. She was treated with injections following the surgery but continued to have progressive pain and issues with that knee. She eventually had to have a second knee arthroscopy. Since then, the knee continues to get worse. She describes pain at rest but worse with weight bearing and at night. The knee swells and has popping and buckling sensations. Fortunately, it is not locking or catching with her. Ice helps along with heat. She has not done any topical rubs. She has difficulty getting up and down steps, getting in and out of the car. It's very stiff after sitting and first thing in the morning. She does have some hip and groin pain but no where near the level of the knee. She is at a point where she is ready to get the knee fixed.   ROS Constitutional:  Constitutional: no fever, chills, night sweats, or significant weight loss.  Cardiovascular: Cardiovascular: no chest pain and palpitations.  Respiratory: Respiratory: no cough or shortness of breath.  Gastrointestinal: Gastrointestinal: vomiting and nausea.  Musculoskeletal:  Musculoskeletal: Joint Pain and swelling in Joints and back pain.  Neurologic: Neurologic: no numbness, tingling, or difficulty with balance.   Physical Exam Patient is a 47 year old female.  General Mental Status - Alert, cooperative and good historian. General Appearance - pleasant, Not in acute distress. Orientation - Oriented X3. Build & Nutrition - Well nourished and Well developed. Petite Frame  Head and Neck Head - normocephalic, atraumatic . Upper Dentures Neck Global Assessment - supple, no bruit auscultated on the right, no bruit auscultated on the left.  Eye Pupil - Bilateral - PERR Motion - Bilateral - EOMI.  Chest and Lung Exam Auscultation Breath sounds - clear at anterior chest wall and clear at posterior chest wall. Adventitious sounds - No Adventitious sounds.  Cardiovascular Auscultation Rhythm - Regular rate and rhythm. Heart Sounds - S1 WNL and S2 WNL. Murmurs & Other Heart Sounds - Auscultation of the heart reveals - No Murmurs.  Abdomen Palpation/Percussion Tenderness - Abdomen is non-tender to palpation. Abdomen is soft. Auscultation Auscultation of the abdomen reveals - Bowel sounds normal.  Genitourinary Note: Not done, not pertinent to present illness  Musculoskeletal Right hip shows flexion to 120 rotation in 30 abduction 40 and external rotation of 40. There is no tenderness over the greater trochanter. There is no pain on provocative testing of the hip. Left hip shows flexion to 120 rotation in 30 out 40 and abduction 40 without discomfort. There is no tenderness over the greater trochanter. There is no pain on provocative testing of the hip. Left knee shows no effusion. Range of motion 0-125. No medial or lateral joint line tenderness. No instability. No crepitus on range of motion. RIGHT knee shows no effusion. Range of motion is 0-135. She is very tender medially. There is no lateral tenderness or instability noted. Pulses sensation and  motor are intact. Gait pattern is antalgic on the RIGHT.  Radiographs-AP and lateral of the knees show that she has significant medial joint space narrowing on the RIGHT just about bone-on-bone. Lateral and patellofemoral compartments are normal    Assessment / Plan 1. Osteoarthritis of right knee joint M17.11: Unilateral primary osteoarthritis, right knee  Patient Instructions Surgical Plans: Right Unicompartmental Knee Replacement Disposition: Home, Straight to Outpatient Therapy - [facility] in [place], [place] PCP: [facility] - seen and cleared to proceed with surgery IV TXA Anesthesia Issues: None Patient was instructed on what medications to stop prior to surgery. - Follow up visit in 2 weeks with Dr. Wynelle Link - Begin physical therapy following surgery - Pre-operative lab work as pre Pre-Surgical Testing - Prescriptions will be provided in hospital at time of discharge  Return to [place], MD for [facility] on 03/20/2018 at 03:45 PM  Encounter signed-off by Mickel Crow, PA-C

## 2018-03-05 ENCOUNTER — Ambulatory Visit (INDEPENDENT_AMBULATORY_CARE_PROVIDER_SITE_OTHER): Payer: Medicaid Other | Admitting: Adult Health

## 2018-03-05 ENCOUNTER — Encounter: Payer: Self-pay | Admitting: Adult Health

## 2018-03-05 VITALS — BP 118/66 | HR 78 | Ht 61.0 in | Wt 120.0 lb

## 2018-03-05 DIAGNOSIS — F419 Anxiety disorder, unspecified: Secondary | ICD-10-CM

## 2018-03-05 DIAGNOSIS — Z8619 Personal history of other infectious and parasitic diseases: Secondary | ICD-10-CM

## 2018-03-05 LAB — POCT WET PREP (WET MOUNT)
Clue Cells Wet Prep Whiff POC: NEGATIVE
TRICHOMONAS WET PREP HPF POC: ABSENT

## 2018-03-05 MED ORDER — BUTALBITAL-APAP-CAFFEINE 50-325-40 MG PO TABS: 1.0000 | ORAL_TABLET | Freq: Four times a day (QID) | ORAL | 1 refills | Status: DC | PRN

## 2018-03-05 MED ORDER — LORAZEPAM 0.5 MG PO TABS: ORAL_TABLET | ORAL | 1 refills | Status: DC

## 2018-03-05 NOTE — Progress Notes (Signed)
Subjective:     Patient ID: Julie [name], female   DOB: December 11, 1970, 47 y.o.   MRN: [id number]  HPI Julie [name] is a 47 year old white female in for proof of cure for trich, and she is having right knee replacement Wednesday and is nervous, and requests refill on ativan and Fioricet, had to reschedule appt with headache doctor.   Review of Systems +anxiety +headaches Reviewed past medical,surgical, social and family history. Reviewed medications and allergies.     Objective:   Physical Exam BP 118/66 (BP Location: Right Arm, Patient Position: Sitting, Cuff Size: Normal)   Pulse 78   Ht 5\' 1"  (1.549 m)   Wt 120 lb (54.4 kg)   BMI 22.67 kg/m    Skin warm and dry.Pelvic: external genitalia is normal in appearance no lesions, vagina: +period blood, no odor,urethra has no lesions or masses noted, cervix:smooth and bulbous, uterus: normal size, shape and contour, non tender, no masses felt, adnexa: no masses or tenderness noted. Bladder is non tender and no masses felt. Wet prep: + few WBCs Assessment:     1. History of trichomoniasis   2. Anxiety       Plan:    Use condoms Meds ordered this encounter  Medications  . butalbital-acetaminophen-caffeine (FIORICET, ESGIC) 50-325-40 MG tablet    Sig: Take 1 tablet by mouth every 6 (six) hours as needed for headache.    Dispense:  30 tablet    Refill:  1    Order Specific Question:   Supervising Provider    Answer:   Elonda Husky, LUTHER H [[id number]]  . LORazepam (ATIVAN) 0.5 MG tablet    Sig: Take 1 bid prn anxiety    Dispense:  30 tablet    Refill:  1    Order Specific Question:   Supervising Provider    Answer:   Tania Ade H [[id number]]  F/U prn

## 2018-03-06 MED ORDER — BUPIVACAINE LIPOSOME 1.3 % IJ SUSP
20.0000 mL | INTRAMUSCULAR | Status: DC
  Filled 2018-03-06: qty 20

## 2018-03-06 MED ORDER — TRANEXAMIC ACID 1000 MG/10ML IV SOLN
1000.0000 mg | INTRAVENOUS | Status: AC
  Administered 2018-03-07: 1000 mg via INTRAVENOUS
  Filled 2018-03-06: qty 1100

## 2018-03-07 ENCOUNTER — Ambulatory Visit (HOSPITAL_COMMUNITY): Payer: Medicaid Other | Admitting: Anesthesiology

## 2018-03-07 ENCOUNTER — Ambulatory Visit: Payer: Medicaid Other | Admitting: Neurology

## 2018-03-07 ENCOUNTER — Observation Stay (HOSPITAL_COMMUNITY)
Admission: RE | Admit: 2018-03-07 | Discharge: 2018-03-08 | Disposition: A | Discharge status: Home / Self Care | Payer: Medicaid Other | Source: Ambulatory Visit | Attending: Orthopedic Surgery | Admitting: Orthopedic Surgery

## 2018-03-07 ENCOUNTER — Encounter (HOSPITAL_COMMUNITY)
Admission: RE | Disposition: A | Discharge status: Home / Self Care | Payer: Self-pay | Source: Ambulatory Visit | Attending: Orthopedic Surgery

## 2018-03-07 DIAGNOSIS — R011 Cardiac murmur, unspecified: Secondary | ICD-10-CM | POA: Diagnosis not present

## 2018-03-07 DIAGNOSIS — Z87442 Personal history of urinary calculi: Secondary | ICD-10-CM | POA: Insufficient documentation

## 2018-03-07 DIAGNOSIS — Z885 Allergy status to narcotic agent status: Secondary | ICD-10-CM | POA: Diagnosis not present

## 2018-03-07 DIAGNOSIS — Z880 Allergy status to penicillin: Secondary | ICD-10-CM | POA: Diagnosis not present

## 2018-03-07 DIAGNOSIS — Z881 Allergy status to other antibiotic agents status: Secondary | ICD-10-CM | POA: Diagnosis not present

## 2018-03-07 DIAGNOSIS — K589 Irritable bowel syndrome without diarrhea: Secondary | ICD-10-CM | POA: Insufficient documentation

## 2018-03-07 DIAGNOSIS — J45909 Unspecified asthma, uncomplicated: Secondary | ICD-10-CM | POA: Diagnosis not present

## 2018-03-07 DIAGNOSIS — F419 Anxiety disorder, unspecified: Secondary | ICD-10-CM | POA: Insufficient documentation

## 2018-03-07 DIAGNOSIS — Z888 Allergy status to other drugs, medicaments and biological substances status: Secondary | ICD-10-CM | POA: Insufficient documentation

## 2018-03-07 DIAGNOSIS — Z79899 Other long term (current) drug therapy: Secondary | ICD-10-CM | POA: Insufficient documentation

## 2018-03-07 DIAGNOSIS — E785 Hyperlipidemia, unspecified: Secondary | ICD-10-CM | POA: Insufficient documentation

## 2018-03-07 DIAGNOSIS — K219 Gastro-esophageal reflux disease without esophagitis: Secondary | ICD-10-CM | POA: Diagnosis not present

## 2018-03-07 DIAGNOSIS — Z7982 Long term (current) use of aspirin: Secondary | ICD-10-CM | POA: Insufficient documentation

## 2018-03-07 DIAGNOSIS — M1712 Unilateral primary osteoarthritis, left knee: Principal | ICD-10-CM | POA: Insufficient documentation

## 2018-03-07 DIAGNOSIS — F329 Major depressive disorder, single episode, unspecified: Secondary | ICD-10-CM | POA: Diagnosis not present

## 2018-03-07 DIAGNOSIS — M179 Osteoarthritis of knee, unspecified: Secondary | ICD-10-CM | POA: Diagnosis present

## 2018-03-07 DIAGNOSIS — F17200 Nicotine dependence, unspecified, uncomplicated: Secondary | ICD-10-CM | POA: Insufficient documentation

## 2018-03-07 DIAGNOSIS — M1710 Unilateral primary osteoarthritis, unspecified knee: Secondary | ICD-10-CM | POA: Diagnosis present

## 2018-03-07 HISTORY — PX: PARTIAL KNEE ARTHROPLASTY: SHX2174

## 2018-03-07 LAB — TYPE AND SCREEN
ABO/RH(D): O POS
Antibody Screen: NEGATIVE

## 2018-03-07 SURGERY — ARTHROPLASTY, KNEE, UNICOMPARTMENTAL
Anesthesia: Spinal | Site: Knee | Laterality: Right

## 2018-03-07 MED ORDER — PROPOFOL 500 MG/50ML IV EMUL
INTRAVENOUS | Status: DC | PRN
  Administered 2018-03-07: 100 ug/kg/min via INTRAVENOUS

## 2018-03-07 MED ORDER — POLYETHYLENE GLYCOL 3350 17 G PO PACK: 17.0000 g | PACK | Freq: Every day | ORAL | Status: DC | PRN

## 2018-03-07 MED ORDER — ALBUTEROL SULFATE (2.5 MG/3ML) 0.083% IN NEBU: 2.5000 mg | INHALATION_SOLUTION | Freq: Four times a day (QID) | RESPIRATORY_TRACT | Status: DC | PRN

## 2018-03-07 MED ORDER — LORATADINE 10 MG PO TABS
10.0000 mg | ORAL_TABLET | Freq: Every day | ORAL | Status: DC
  Administered 2018-03-08: 10 mg via ORAL
  Filled 2018-03-07: qty 1

## 2018-03-07 MED ORDER — BUTALBITAL-APAP-CAFFEINE 50-325-40 MG PO TABS: 1.0000 | ORAL_TABLET | Freq: Four times a day (QID) | ORAL | Status: DC | PRN

## 2018-03-07 MED ORDER — ONDANSETRON HCL 4 MG PO TABS: 4.0000 mg | ORAL_TABLET | Freq: Three times a day (TID) | ORAL | Status: DC | PRN

## 2018-03-07 MED ORDER — MIDAZOLAM HCL 2 MG/2ML IJ SOLN
INTRAMUSCULAR | Status: AC
  Filled 2018-03-07: qty 2

## 2018-03-07 MED ORDER — LIDOCAINE 2% (20 MG/ML) 5 ML SYRINGE
INTRAMUSCULAR | Status: AC
  Filled 2018-03-07: qty 5

## 2018-03-07 MED ORDER — PHENOL 1.4 % MT LIQD
1.0000 | OROMUCOSAL | Status: DC | PRN
  Filled 2018-03-07: qty 177

## 2018-03-07 MED ORDER — BISACODYL 10 MG RE SUPP: 10.0000 mg | Freq: Every day | RECTAL | Status: DC | PRN

## 2018-03-07 MED ORDER — LACTATED RINGERS IV SOLN
INTRAVENOUS | Status: DC
  Administered 2018-03-07 (×2): via INTRAVENOUS

## 2018-03-07 MED ORDER — PROMETHAZINE HCL 25 MG/ML IJ SOLN
INTRAMUSCULAR | Status: AC
  Filled 2018-03-07: qty 1

## 2018-03-07 MED ORDER — DEXAMETHASONE SODIUM PHOSPHATE 10 MG/ML IJ SOLN
INTRAMUSCULAR | Status: AC
  Filled 2018-03-07: qty 2

## 2018-03-07 MED ORDER — PROPOFOL 10 MG/ML IV BOLUS
INTRAVENOUS | Status: AC
  Filled 2018-03-07: qty 40

## 2018-03-07 MED ORDER — ACETAMINOPHEN 10 MG/ML IV SOLN
1000.0000 mg | Freq: Once | INTRAVENOUS | Status: AC
  Administered 2018-03-07: 1000 mg via INTRAVENOUS
  Filled 2018-03-07: qty 100

## 2018-03-07 MED ORDER — FLEET ENEMA 7-19 GM/118ML RE ENEM: 1.0000 | ENEMA | Freq: Once | RECTAL | Status: DC | PRN

## 2018-03-07 MED ORDER — FENTANYL CITRATE (PF) 100 MCG/2ML IJ SOLN
100.0000 ug | INTRAMUSCULAR | Status: DC | PRN
  Administered 2018-03-07 (×2): 50 ug via INTRAVENOUS
  Filled 2018-03-07: qty 2

## 2018-03-07 MED ORDER — ONDANSETRON HCL 4 MG/2ML IJ SOLN
INTRAMUSCULAR | Status: DC | PRN
  Administered 2018-03-07: 4 mg via INTRAVENOUS

## 2018-03-07 MED ORDER — BUPIVACAINE LIPOSOME 1.3 % IJ SUSP
INTRAMUSCULAR | Status: DC | PRN
  Administered 2018-03-07: 20 mL

## 2018-03-07 MED ORDER — 0.9 % SODIUM CHLORIDE (POUR BTL) OPTIME
TOPICAL | Status: DC | PRN
  Administered 2018-03-07: 1000 mL

## 2018-03-07 MED ORDER — HYDROMORPHONE HCL 1 MG/ML IJ SOLN
0.5000 mg | INTRAMUSCULAR | Status: DC | PRN
  Administered 2018-03-07 – 2018-03-08 (×3): 1 mg via INTRAVENOUS
  Filled 2018-03-07 (×3): qty 1

## 2018-03-07 MED ORDER — ACETAMINOPHEN 500 MG PO TABS
1000.0000 mg | ORAL_TABLET | Freq: Four times a day (QID) | ORAL | Status: DC
  Administered 2018-03-07 – 2018-03-08 (×3): 1000 mg via ORAL
  Filled 2018-03-07 (×3): qty 2

## 2018-03-07 MED ORDER — METOCLOPRAMIDE HCL 5 MG/ML IJ SOLN: 5.0000 mg | Freq: Three times a day (TID) | INTRAMUSCULAR | Status: DC | PRN

## 2018-03-07 MED ORDER — LORAZEPAM 1 MG PO TABS
1.0000 mg | ORAL_TABLET | Freq: Two times a day (BID) | ORAL | Status: DC | PRN
  Administered 2018-03-07: 21:00:00 1 mg via ORAL
  Filled 2018-03-07: qty 1

## 2018-03-07 MED ORDER — DIPHENHYDRAMINE HCL 12.5 MG/5ML PO ELIX: 12.5000 mg | ORAL_SOLUTION | ORAL | Status: DC | PRN

## 2018-03-07 MED ORDER — PHENYLEPHRINE 40 MCG/ML (10ML) SYRINGE FOR IV PUSH (FOR BLOOD PRESSURE SUPPORT)
PREFILLED_SYRINGE | INTRAVENOUS | Status: AC
  Filled 2018-03-07: qty 10

## 2018-03-07 MED ORDER — VANCOMYCIN HCL IN DEXTROSE 1-5 GM/200ML-% IV SOLN
1000.0000 mg | Freq: Two times a day (BID) | INTRAVENOUS | Status: AC
  Administered 2018-03-08: 03:00:00 1000 mg via INTRAVENOUS
  Filled 2018-03-07: qty 200

## 2018-03-07 MED ORDER — PANTOPRAZOLE SODIUM 20 MG PO TBEC
20.0000 mg | DELAYED_RELEASE_TABLET | Freq: Two times a day (BID) | ORAL | Status: DC
  Administered 2018-03-08: 20 mg via ORAL
  Filled 2018-03-07 (×2): qty 1

## 2018-03-07 MED ORDER — CHLORHEXIDINE GLUCONATE 4 % EX LIQD
60.0000 mL | Freq: Once | CUTANEOUS | Status: AC
  Administered 2018-03-07: 4 via TOPICAL

## 2018-03-07 MED ORDER — SODIUM CHLORIDE 0.9 % IJ SOLN
INTRAMUSCULAR | Status: AC
  Filled 2018-03-07: qty 50

## 2018-03-07 MED ORDER — BUPIVACAINE HCL (PF) 0.25 % IJ SOLN
INTRAMUSCULAR | Status: AC
  Filled 2018-03-07: qty 30

## 2018-03-07 MED ORDER — DOCUSATE SODIUM 100 MG PO CAPS
100.0000 mg | ORAL_CAPSULE | Freq: Two times a day (BID) | ORAL | Status: DC
  Administered 2018-03-07 – 2018-03-08 (×2): 100 mg via ORAL
  Filled 2018-03-07 (×2): qty 1

## 2018-03-07 MED ORDER — METHOCARBAMOL 500 MG PO TABS
500.0000 mg | ORAL_TABLET | Freq: Four times a day (QID) | ORAL | Status: DC | PRN
  Administered 2018-03-08 (×2): 500 mg via ORAL
  Filled 2018-03-07 (×2): qty 1

## 2018-03-07 MED ORDER — SODIUM CHLORIDE 0.9 % IJ SOLN
INTRAMUSCULAR | Status: DC | PRN
  Administered 2018-03-07: 50 mL

## 2018-03-07 MED ORDER — MIDAZOLAM HCL 2 MG/2ML IJ SOLN
2.0000 mg | INTRAMUSCULAR | Status: DC | PRN
  Administered 2018-03-07: 2 mg via INTRAVENOUS
  Filled 2018-03-07: qty 2

## 2018-03-07 MED ORDER — LIDOCAINE HCL (CARDIAC) 20 MG/ML IV SOLN
INTRAVENOUS | Status: DC | PRN
  Administered 2018-03-07: 50 mg via INTRAVENOUS

## 2018-03-07 MED ORDER — METOCLOPRAMIDE HCL 5 MG PO TABS: 5.0000 mg | ORAL_TABLET | Freq: Three times a day (TID) | ORAL | Status: DC | PRN

## 2018-03-07 MED ORDER — PROPOFOL 10 MG/ML IV BOLUS
INTRAVENOUS | Status: DC | PRN
  Administered 2018-03-07: 30 mg via INTRAVENOUS

## 2018-03-07 MED ORDER — SODIUM CHLORIDE 0.9 % IV SOLN
INTRAVENOUS | Status: DC
  Administered 2018-03-07: 20:00:00 via INTRAVENOUS

## 2018-03-07 MED ORDER — FLUTICASONE PROPIONATE 50 MCG/ACT NA SUSP
2.0000 | Freq: Every day | NASAL | Status: DC
  Administered 2018-03-08: 2 via NASAL
  Filled 2018-03-07: qty 16

## 2018-03-07 MED ORDER — PROMETHAZINE HCL 25 MG/ML IJ SOLN
6.2500 mg | INTRAMUSCULAR | Status: DC | PRN
  Administered 2018-03-07: 6.25 mg via INTRAVENOUS

## 2018-03-07 MED ORDER — RIVAROXABAN 10 MG PO TABS
10.0000 mg | ORAL_TABLET | Freq: Every day | ORAL | Status: DC
  Administered 2018-03-08: 10 mg via ORAL
  Filled 2018-03-07: qty 1

## 2018-03-07 MED ORDER — SODIUM CHLORIDE 0.9 % IR SOLN
Status: DC | PRN
  Administered 2018-03-07: 1000 mL

## 2018-03-07 MED ORDER — ROPIVACAINE HCL 7.5 MG/ML IJ SOLN
INTRAMUSCULAR | Status: DC | PRN
  Administered 2018-03-07: 20 mL via PERINEURAL

## 2018-03-07 MED ORDER — HYOSCYAMINE SULFATE 0.125 MG SL SUBL
0.1250 mg | SUBLINGUAL_TABLET | SUBLINGUAL | Status: DC | PRN
  Filled 2018-03-07: qty 1

## 2018-03-07 MED ORDER — ONDANSETRON HCL 4 MG PO TABS: 4.0000 mg | ORAL_TABLET | Freq: Four times a day (QID) | ORAL | Status: DC | PRN

## 2018-03-07 MED ORDER — PHENYLEPHRINE 40 MCG/ML (10ML) SYRINGE FOR IV PUSH (FOR BLOOD PRESSURE SUPPORT)
PREFILLED_SYRINGE | INTRAVENOUS | Status: AC
Start: 2018-03-07 — End: ?
  Filled 2018-03-07: qty 10

## 2018-03-07 MED ORDER — DEXAMETHASONE SODIUM PHOSPHATE 10 MG/ML IJ SOLN
10.0000 mg | Freq: Once | INTRAMUSCULAR | Status: AC
  Administered 2018-03-07: 10 mg via INTRAVENOUS

## 2018-03-07 MED ORDER — STERILE WATER FOR IRRIGATION IR SOLN
Status: DC | PRN
  Administered 2018-03-07: 2000 mL

## 2018-03-07 MED ORDER — ALBUTEROL SULFATE HFA 108 (90 BASE) MCG/ACT IN AERS: 2.0000 | INHALATION_SPRAY | Freq: Four times a day (QID) | RESPIRATORY_TRACT | Status: DC | PRN

## 2018-03-07 MED ORDER — PHENYLEPHRINE HCL 10 MG/ML IJ SOLN
INTRAMUSCULAR | Status: DC | PRN
  Administered 2018-03-07: 40 ug via INTRAVENOUS
  Administered 2018-03-07 (×9): 60 ug via INTRAVENOUS

## 2018-03-07 MED ORDER — PROPOFOL 10 MG/ML IV BOLUS
INTRAVENOUS | Status: AC
  Filled 2018-03-07: qty 20

## 2018-03-07 MED ORDER — MENTHOL 3 MG MT LOZG: 1.0000 | LOZENGE | OROMUCOSAL | Status: DC | PRN

## 2018-03-07 MED ORDER — OXYCODONE HCL 5 MG PO TABS
10.0000 mg | ORAL_TABLET | ORAL | Status: DC | PRN
  Administered 2018-03-07: 20:00:00 10 mg via ORAL
  Administered 2018-03-08 (×4): 15 mg via ORAL
  Filled 2018-03-07: qty 2
  Filled 2018-03-07 (×3): qty 3

## 2018-03-07 MED ORDER — MIDAZOLAM HCL 5 MG/5ML IJ SOLN
INTRAMUSCULAR | Status: DC | PRN
  Administered 2018-03-07 (×2): 1 mg via INTRAVENOUS

## 2018-03-07 MED ORDER — HYDROMORPHONE HCL 1 MG/ML IJ SOLN
0.2500 mg | INTRAMUSCULAR | Status: DC | PRN
  Administered 2018-03-07 (×2): 0.25 mg via INTRAVENOUS

## 2018-03-07 MED ORDER — ONDANSETRON HCL 4 MG/2ML IJ SOLN
INTRAMUSCULAR | Status: AC
  Filled 2018-03-07: qty 2

## 2018-03-07 MED ORDER — DEXAMETHASONE SODIUM PHOSPHATE 10 MG/ML IJ SOLN
10.0000 mg | Freq: Once | INTRAMUSCULAR | Status: AC
  Administered 2018-03-08: 10 mg via INTRAVENOUS
  Filled 2018-03-07: qty 1

## 2018-03-07 MED ORDER — METHOCARBAMOL 1000 MG/10ML IJ SOLN
500.0000 mg | Freq: Four times a day (QID) | INTRAMUSCULAR | Status: DC | PRN
  Administered 2018-03-07: 500 mg via INTRAVENOUS
  Filled 2018-03-07: qty 550
  Filled 2018-03-07: qty 5

## 2018-03-07 MED ORDER — EPHEDRINE 5 MG/ML INJ
INTRAVENOUS | Status: AC
  Filled 2018-03-07: qty 10

## 2018-03-07 MED ORDER — CITALOPRAM HYDROBROMIDE 20 MG PO TABS
40.0000 mg | ORAL_TABLET | Freq: Every day | ORAL | Status: DC
  Administered 2018-03-08: 40 mg via ORAL
  Filled 2018-03-07: qty 2

## 2018-03-07 MED ORDER — HYDROMORPHONE HCL 1 MG/ML IJ SOLN
INTRAMUSCULAR | Status: AC
  Administered 2018-03-07: 0.25 mg via INTRAVENOUS
  Filled 2018-03-07: qty 1

## 2018-03-07 MED ORDER — VANCOMYCIN HCL IN DEXTROSE 1-5 GM/200ML-% IV SOLN
1000.0000 mg | INTRAVENOUS | Status: AC
  Administered 2018-03-07: 1000 mg via INTRAVENOUS
  Filled 2018-03-07: qty 200

## 2018-03-07 MED ORDER — OXYCODONE HCL 5 MG PO TABS
5.0000 mg | ORAL_TABLET | ORAL | Status: DC | PRN
  Filled 2018-03-07: qty 2
  Filled 2018-03-07: qty 1

## 2018-03-07 MED ORDER — ONDANSETRON HCL 4 MG/2ML IJ SOLN: 4.0000 mg | Freq: Four times a day (QID) | INTRAMUSCULAR | Status: DC | PRN

## 2018-03-07 SURGICAL SUPPLY — 58 items
BAG DECANTER FOR FLEXI CONT (MISCELLANEOUS) ×3 IMPLANT
BAG SPEC THK2 15X12 ZIP CLS (MISCELLANEOUS)
BAG ZIPLOCK 12X15 (MISCELLANEOUS) IMPLANT
BANDAGE ACE 6X5 VEL STRL LF (GAUZE/BANDAGES/DRESSINGS) ×3 IMPLANT
BLADE SAW RECIPROCATING 77.5 (BLADE) ×3 IMPLANT
BLADE SAW SGTL 13.0X1.19X90.0M (BLADE) ×3 IMPLANT
BOWL SMART MIX CTS (DISPOSABLE) ×3 IMPLANT
BRNG TIB D 8 STRL KN RT MDL (Orthopedic Implant) ×1 IMPLANT
BSPLAT TIB D STRL KN RT MED TI (Orthopedic Implant) ×1 IMPLANT
BUR OVAL CARBIDE 4.0 (BURR) IMPLANT
CEMENT HV SMART SET (Cement) ×3 IMPLANT
CLOSURE WOUND 1/2 X4 (GAUZE/BANDAGES/DRESSINGS) ×2
CLOTH BEACON ORANGE TIMEOUT ST (SAFETY) IMPLANT
COMP FEM PRSN CEM SZ 3 RT (Orthopedic Implant) ×1 IMPLANT
COVER SURGICAL LIGHT HANDLE (MISCELLANEOUS) ×3 IMPLANT
CUFF TOURN SGL QUICK 34 (TOURNIQUET CUFF) ×3
CUFF TRNQT CYL 34X4X40X1 (TOURNIQUET CUFF) ×1 IMPLANT
DRAPE TOP 10253 STERILE (DRAPES) IMPLANT
DRILL PIN HEADLESS TROCAR 3X75 (Knees) ×6 IMPLANT
DRSG ADAPTIC 3X8 NADH LF (GAUZE/BANDAGES/DRESSINGS) ×3 IMPLANT
DRSG PAD ABDOMINAL 8X10 ST (GAUZE/BANDAGES/DRESSINGS) ×3 IMPLANT
DURAPREP 26ML APPLICATOR (WOUND CARE) ×3 IMPLANT
ELECT REM PT RETURN 15FT ADLT (MISCELLANEOUS) ×3 IMPLANT
EVACUATOR 1/8 PVC DRAIN (DRAIN) ×3 IMPLANT
GAUZE SPONGE 4X4 12PLY STRL (GAUZE/BANDAGES/DRESSINGS) ×3 IMPLANT
GLOVE BIO SURGEON STRL SZ7.5 (GLOVE) ×3 IMPLANT
GLOVE BIO SURGEON STRL SZ8 (GLOVE) ×3 IMPLANT
GLOVE BIOGEL PI IND STRL 8 (GLOVE) ×2 IMPLANT
GLOVE BIOGEL PI INDICATOR 8 (GLOVE) ×4
GOWN STRL REUS W/TWL LRG LVL3 (GOWN DISPOSABLE) ×3 IMPLANT
GOWN STRL REUS W/TWL XL LVL3 (GOWN DISPOSABLE) ×3 IMPLANT
HANDPIECE INTERPULSE COAX TIP (DISPOSABLE) ×3
IMMOBILIZER KNEE 20 (SOFTGOODS) ×3
IMMOBILIZER KNEE 20 THIGH 36 (SOFTGOODS) ×1 IMPLANT
INSERTER TIP PARTIAL KNEE (Miscellaneous) ×3 IMPLANT
MANIFOLD NEPTUNE II (INSTRUMENTS) ×3 IMPLANT
PACK TOTAL KNEE CUSTOM (KITS) ×3 IMPLANT
PADDING CAST COTTON 6X4 STRL (CAST SUPPLIES) ×6 IMPLANT
PART FEMUR CEM SZ 3 RT KNEE (Orthopedic Implant) ×3 IMPLANT
PART TIBIAL CEM SZ D RT KNEE (Orthopedic Implant) ×3 IMPLANT
PARTVIVACIT-E SZ D 8 KNEE (Orthopedic Implant) ×3 IMPLANT
POSITIONER SURGICAL ARM (MISCELLANEOUS) ×3 IMPLANT
PROSTHESIS KNEE VIVACTV E SZD8 (Orthopedic Implant) ×1 IMPLANT
Partial Femur ×2 IMPLANT
Partial Tibial ×3 IMPLANT
SCREW HEADED 33MM KNEE (Screw) ×6 IMPLANT
SCREW HEADED 48MM KNEE (Screw) ×6 IMPLANT
SET HNDPC FAN SPRY TIP SCT (DISPOSABLE) ×1 IMPLANT
STRIP CLOSURE SKIN 1/2X4 (GAUZE/BANDAGES/DRESSINGS) ×4 IMPLANT
SUT MNCRL AB 4-0 PS2 18 (SUTURE) ×3 IMPLANT
SUT STRATAFIX 0 PDS 27 VIOLET (SUTURE) ×3
SUT VIC AB 2-0 CT1 27 (SUTURE) ×6
SUT VIC AB 2-0 CT1 TAPERPNT 27 (SUTURE) ×2 IMPLANT
SUTURE STRATFX 0 PDS 27 VIOLET (SUTURE) ×1 IMPLANT
SYR 50ML LL SCALE MARK (SYRINGE) ×3 IMPLANT
SYSTEM KNEE TIBIAL CEM SZ D RT (Orthopedic Implant) ×1 IMPLANT
WRAP KNEE MAXI GEL POST OP (GAUZE/BANDAGES/DRESSINGS) ×3 IMPLANT
partial Articular Crosslink Polyethylene ×3 IMPLANT

## 2018-03-07 NOTE — Op Note (Signed)
OPERATIVE REPORT-UNICOMPARTMENTAL ARTHROPLASTY  PREOPERATIVE DIAGNOSIS: Medial compartment osteoarthritis, Left knee  POSTOPERATIVE DIAGNOSIS: Medial compartment osteoarthritis, Left knee  PROCEDURE:Left knee medial unicompartmental arthroplasty. ([employer])  SURGEON: Gaynelle Arabian, MD   ASSISTANT: Arlee Muslim, PA-C  ANESTHESIA:  Adductor canal block and spinal.   ESTIMATED BLOOD LOSS: Minimal.   DRAINS: Hemovac x1.   TOURNIQUET TIME:   Total Tourniquet Time Documented: Thigh (Left) - 39 minutes Total: Thigh (Left) - 39 minutes    COMPLICATIONS: None.   CONDITION: Stable to recovery.   BRIEF CLINICAL NOTE: Julie [name] is a 47 y.o. female, who has  significant isolated medial compartment arthritis of the Left knee. The patient has had nonoperative management including injections of cortisone and viscous supplements. Unfortunately, the pain persists.  Radiograph showed isolated medial compartment  arthritis  with normal-appearing patellofemoral and lateral compartments. The patient presents now for left knee unicompartmental arthroplasty.   PROCEDURE IN DETAIL: After successful administration of  Adductor canal block and spinal anesthetic, a tourniquet was placed high on the  Left thigh and the Left lower extremity prepped and draped in usual sterile fashion. Extremity was wrapped in an Esmarch, knee flexed, and tourniquet inflated to 300 mmHg.       A midline incision was made with a 10 blade through subcutaneous  tissue to the extensor mechanism. A fresh blade was used to make a  medial parapatellar arthrotomy. Soft tissue on the proximal medial  tibia subperiosteally elevated to the joint line with a knife and into  the semimembranosus bursa with a Cobb elevator. The patella was  subluxed laterally, and the knee flexed 90 degrees. The ACL was intact.  The marginal osteophytes on the medial femur and tibia were removed with  a rongeur. The medial meniscus was also  removed. The extramedullary tibial cutting guide was placed referencing Proximally at the medial aspect of the tibial tubercle and distally along the 2nd metatarsal axis. 5 degrees of posterior slope was dialed in and the block was pinned to remove 4 mm from the medial tibial surface.The cut is made with an oscillating saw and the cut bone removed.      The 8 mm spacer was then placed with the knee in flexion with a stable fit in flexion and extension. The distal femoral cutting guide was attached to the spacer in extension and pinned to make the distal femur cut with an oscillating saw.  The trial size 3 cutting guide is placed and is most appropriate. The femoral preparation is performed with the drilling of the 2 lug holes Then the posterior and the chamfer cut. The trial size 3 femur is placed with excellent fit. The 8 mm spacer is placed and there is excellent balance through full range of motion.  The trial and the spacer are removed and tibia addressed.      The tibial sizer is placed and size D is most appropriate. The proximal tibia is prepared with the drill holes and keel for the size D. The size D implant is placed with excellent fit. The trials are removed and cut bone surfaces prepared with pulsatile lavage. The cement is mixed and once ready for implantation The size D tibia and size 3 femur are cemented into place and all extruded cement removed. The 8 mm insert is then placed into the tibial tray  and locked into position. The knee is placed through a full range of motion with excellent stability.  I then injected the extensor mechanism, periosteum of  the femur and subcu tissues, a total of 20 mL of Exparel mixed with 40  mL of saline. Wound was copiously irrigated with saline solution, and the arthrotomy closed over a Hemovac drain with a running #1 Stratofix  suture. The subcutaneous was closed with  interrupted 2-0 Vicryl and subcuticular running 4-0 Monocryl. The drain   was hooked to suction. Incision cleaned and dried and Steri-Strips and  a bulky sterile dressing applied. The tourniquet was released after a  total time of 39 minutes. This was done after closing the extensor  mechanism. The wound was closed and a bulky sterile dressing was  applied. The operative limb was placed into a knee immobilizer, and the patient awakened and transported to recovery room in stable condition.       Please note that a surgical assistant was a medical necessity for this  procedure in order to perform it in a safe and expeditious manner.  Assistance was necessary for retracting vital ligaments, neurovascular  structures, as well as for proper positioning of the limb to allow for  appropriate bone cuts and appropriate placement of the prosthesis.    Julie Plover Princetta Uplinger, MD

## 2018-03-07 NOTE — Anesthesia Preprocedure Evaluation (Addendum)
Anesthesia Evaluation  Patient identified by MRN, date of birth, ID band Patient awake    Reviewed: Allergy & Precautions, NPO status , Patient's Chart, lab work & pertinent test results  Airway Mallampati: II  TM Distance: >3 FB Neck ROM: Full    Dental no notable dental hx. (+) Poor Dentition   Pulmonary neg pulmonary ROS, Current Smoker,    Pulmonary exam normal breath sounds clear to auscultation       Cardiovascular + Valvular Problems/Murmurs AI  Rhythm:Regular Rate:Normal + Systolic murmurs    Neuro/Psych Anxiety negative neurological ROS     GI/Hepatic negative GI ROS, Neg liver ROS,   Endo/Other  negative endocrine ROS  Renal/GU negative Renal ROS  negative genitourinary   Musculoskeletal negative musculoskeletal ROS (+)   Abdominal   Peds negative pediatric ROS (+)  Hematology negative hematology ROS (+)   Anesthesia Other Findings   Reproductive/Obstetrics negative OB ROS                            Anesthesia Physical Anesthesia Plan  ASA: III  Anesthesia Plan: Spinal   Post-op Pain Management:  Regional for Post-op pain   Induction: Intravenous  PONV Risk Score and Plan: 2 and Ondansetron and Dexamethasone  Airway Management Planned: Simple Face Mask  Additional Equipment:   Intra-op Plan:   Post-operative Plan:   Informed Consent: I have reviewed the patients History and Physical, chart, labs and discussed the procedure including the risks, benefits and alternatives for the proposed anesthesia with the patient or authorized representative who has indicated his/her understanding and acceptance.   Dental advisory given  Plan Discussed with: CRNA and Surgeon  Anesthesia Plan Comments:         Anesthesia Quick Evaluation

## 2018-03-07 NOTE — Transfer of Care (Signed)
Immediate Anesthesia Transfer of Care Note  Patient: Julie [name]  Procedure(s) Performed: Right knee medial unicompartmental arthroplasty (Right Knee)  Patient Location: PACU  Anesthesia Type:Spinal  Level of Consciousness: awake, alert  and oriented  Airway & Oxygen Therapy: Patient Spontanous Breathing and Patient connected to face mask oxygen  Post-op Assessment: Report given to RN  Post vital signs: Reviewed and stable  Last Vitals:  Vitals Value Taken Time  BP 91/65 03/07/2018  5:31 PM  Temp    Pulse 69 03/07/2018  5:33 PM  Resp 15 03/07/2018  5:33 PM  SpO2 100 % 03/07/2018  5:33 PM  Vitals shown include unvalidated device data.  Last Pain:  Vitals:   03/07/18 1450  TempSrc:   PainSc: 7       Patients Stated Pain Goal: 4 (94/17/40 8144)  Complications: No apparent anesthesia complications

## 2018-03-07 NOTE — Anesthesia Procedure Notes (Signed)
Anesthesia Procedure Image    

## 2018-03-07 NOTE — Anesthesia Procedure Notes (Signed)
Anesthesia Regional Block: Adductor canal block   Pre-Anesthetic Checklist: ,, timeout performed, Correct Patient, Correct Site, Correct Laterality, Correct Procedure, Correct Position, site marked, Risks and benefits discussed,  Surgical consent,  Pre-op evaluation,  At surgeon's request and post-op pain management  Laterality: Right  Prep: chloraprep       Needles:  Injection technique: Single-shot  Needle Type: Echogenic Needle     Needle Length: 9cm      Additional Needles:   Procedures:,,,, ultrasound used (permanent image in chart),,,,  Narrative:  Start time: 03/07/2018 3:22 PM End time: 03/07/2018 3:31 PM Injection made incrementally with aspirations every 5 mL.  Performed by: Personally  Anesthesiologist: Myrtie Soman, MD  Additional Notes: Patient tolerated the procedure well without complications

## 2018-03-07 NOTE — Interval H&P Note (Signed)
History and Physical Interval Note:  03/07/2018 3:35 PM  Julie [name]  has presented today for surgery, with the diagnosis of Right knee medial compartment osteoarthritis  The various methods of treatment have been discussed with the patient and family. After consideration of risks, benefits and other options for treatment, the patient has consented to  Procedure(s): Right knee media unicompartmental arthroplasty (Right) as a surgical intervention .  The patient's history has been reviewed, patient examined, no change in status, stable for surgery.  I have reviewed the patient's chart and labs.  Questions were answered to the patient's satisfaction.     Pilar Plate Maitland Lesiak

## 2018-03-07 NOTE — Progress Notes (Signed)
Assisted Dr. Rose with right, ultrasound guided, adductor canal block. Side rails up, monitors on throughout procedure. See vital signs in flow sheet. Tolerated Procedure well.  

## 2018-03-08 ENCOUNTER — Other Ambulatory Visit: Payer: Self-pay

## 2018-03-08 ENCOUNTER — Encounter (HOSPITAL_COMMUNITY): Payer: Self-pay

## 2018-03-08 DIAGNOSIS — M1712 Unilateral primary osteoarthritis, left knee: Secondary | ICD-10-CM | POA: Diagnosis not present

## 2018-03-08 LAB — BASIC METABOLIC PANEL
Anion gap: 9 (ref 5–15)
BUN: 5 mg/dL — ABNORMAL LOW (ref 6–20)
CO2: 24 mmol/L (ref 22–32)
CREATININE: 0.68 mg/dL (ref 0.44–1.00)
Calcium: 8.5 mg/dL — ABNORMAL LOW (ref 8.9–10.3)
Chloride: 103 mmol/L (ref 101–111)
GFR calc non Af Amer: >60 mL/min (ref >60)
Glucose, Bld: 165 mg/dL — ABNORMAL HIGH (ref 65–99)
POTASSIUM: 3.6 mmol/L (ref 3.5–5.1)
SODIUM: 136 mmol/L (ref 135–145)

## 2018-03-08 LAB — CBC
HCT: 30.5 % — ABNORMAL LOW (ref 36.0–46.0)
HEMOGLOBIN: 9.6 g/dL — AB (ref 12.0–15.0)
MCH: 29.6 pg (ref 26.0–34.0)
MCHC: 31.5 g/dL (ref 30.0–36.0)
MCV: 94.1 fL (ref 78.0–100.0)
PLATELETS: 367 10*3/uL (ref 150–400)
RBC: 3.24 MIL/uL — AB (ref 3.87–5.11)
RDW: 14.5 % (ref 11.5–15.5)
WBC: 19.4 10*3/uL — AB (ref 4.0–10.5)

## 2018-03-08 MED ORDER — RIVAROXABAN 10 MG PO TABS: 10.0000 mg | ORAL_TABLET | Freq: Every day | ORAL | 0 refills | Status: DC

## 2018-03-08 MED ORDER — METHOCARBAMOL 500 MG PO TABS: 500.0000 mg | ORAL_TABLET | Freq: Four times a day (QID) | ORAL | 0 refills | Status: DC | PRN

## 2018-03-08 MED ORDER — OXYCODONE HCL 5 MG PO TABS: 5.0000 mg | ORAL_TABLET | ORAL | 0 refills | Status: DC | PRN

## 2018-03-08 NOTE — Evaluation (Signed)
Physical Therapy Evaluation Patient Details Name: Julie [name] MRN: [id number] DOB: 1971-03-12 Today's Date: 03/08/2018   History of Present Illness  R UKR  Clinical Impression  *the patient complains if pain but is willing to ambulate.Plans DC after PT this PM. Pt admitted with above diagnosis. Pt currently with functional limitations due to the deficits listed below (see PT Problem List). Pt will benefit from skilled PT to increase their independence and safety with mobility to allow discharge to the venue listed below.     Follow Up Recommendations Follow surgeon's recommendation for DC plan and follow-up therapies    Equipment Recommendations  Rolling walker with 5" wheels;3in1 (PT)(youth)    Recommendations for Other Services       Precautions / Restrictions Precautions Precautions: Knee;Fall Required Braces or Orthoses: Knee Immobilizer - Right Knee Immobilizer - Right: Discontinue once straight leg raise with < 10 degree lag Restrictions Weight Bearing Restrictions: No      Mobility  Bed Mobility Overal bed mobility: Needs Assistance Bed Mobility: Supine to Sit     Supine to sit: Min assist     General bed mobility comments: assist with right leg  Transfers Overall transfer level: Needs assistance Equipment used: Rolling walker (2 wheeled) Transfers: Sit to/from Stand Sit to Stand: Min assist         General transfer comment: cues for technique and hand placement  Ambulation/Gait Ambulation/Gait assistance: Min assist Ambulation Distance (Feet): 60 Feet Assistive device: Rolling walker (2 wheeled) Gait Pattern/deviations: Step-to pattern;Decreased step length - right;Decreased stance time - right     General Gait Details: barely placing  weight  on the right foot.  Stairs            Wheelchair Mobility    Modified Rankin (Stroke Patients Only)       Balance                                             Pertinent  Vitals/Pain Pain Assessment: 0-10 Pain Score: 8  Pain Location: right knee Pain Descriptors / Indicators: Aching;Discomfort;Grimacing Pain Intervention(s): Monitored during session;Repositioned;Patient requesting pain meds-RN notified;Ice applied    Home Living Family/patient expects to be discharged to:: Private residence Living Arrangements: Children;Non-relatives/Friends Available Help at Discharge: Family Type of Home: House Home Access: Stairs to enter   [employer] of Steps: 12 or level in back Home Layout: Multi-level Home Equipment: None Additional Comments: can go into level and not have to  negotiate steps.    Prior Function Level of Independence: Independent               Hand Dominance        Extremity/Trunk Assessment   Upper Extremity Assessment Upper Extremity Assessment: Overall WFL for tasks assessed    Lower Extremity Assessment Lower Extremity Assessment: RLE deficits/detail RLE Deficits / Details: 10-30 knee flexion       Communication   Communication: No difficulties  Cognition Arousal/Alertness: Awake/alert Behavior During Therapy: WFL for tasks assessed/performed Overall Cognitive Status: Within Functional Limits for tasks assessed                                        General Comments      Exercises     Assessment/Plan    PT  Assessment Patient needs continued PT services  PT Problem List Decreased strength;Decreased range of motion;Decreased activity tolerance;Decreased safety awareness;Decreased mobility;Decreased knowledge of precautions;Pain       PT Treatment Interventions DME instruction;Gait training;Stair training;Functional mobility training;Patient/family education;Therapeutic activities;Therapeutic exercise    PT Goals (Current goals can be found in the Care Plan section)  Acute Rehab PT Goals [name] PT Goal Formulation: With patient Time For Goal Achievement:  03/10/18 Potential to Achieve Goals: Good    Frequency 7X/week   Barriers to discharge Decreased caregiver support      Co-evaluation               AM-PAC PT "6 Clicks" Daily Activity  Outcome Measure Difficulty turning over in bed (including adjusting bedclothes, sheets and blankets)?: A Little Difficulty moving from lying on back to sitting on the side of the bed? : A Little Difficulty sitting down on and standing up from a chair with arms (e.g., wheelchair, bedside commode, etc,.)?: A Little Help needed moving to and from a bed to chair (including a wheelchair)?: A Little Help needed walking in hospital room?: A Lot Help needed climbing 3-5 steps with a railing? : A Lot 6 Click Score: 16    End of Session Equipment Utilized During Treatment: Right knee immobilizer Activity Tolerance: Patient tolerated treatment well Patient left: in chair;with call bell/phone within reach;with chair alarm set;with family/visitor present Nurse Communication: Mobility status PT Visit Diagnosis: Unsteadiness on feet (R26.81)    Time: 8185-6314 PT Time Calculation (min) (ACUTE ONLY): 31 min   Charges:   PT Evaluation $PT Eval Low Complexity: 1 Low PT Treatments $Gait Training: 8-22 mins   PT G CodesTresa [name] PT [phone]   Julie [name] 03/08/2018, 11:08 AM

## 2018-03-08 NOTE — Anesthesia Postprocedure Evaluation (Signed)
Anesthesia Post Note  Patient: Julie [name]  Procedure(s) Performed: Right knee medial unicompartmental arthroplasty (Right Knee)     Patient location during evaluation: PACU Anesthesia Type: Spinal Level of consciousness: oriented and awake and alert Pain management: pain level controlled Vital Signs Assessment: post-procedure vital signs reviewed and stable Respiratory status: spontaneous breathing, respiratory function stable and patient connected to nasal cannula oxygen Cardiovascular status: blood pressure returned to baseline and stable Postop Assessment: no headache, no backache and no apparent nausea or vomiting Anesthetic complications: no    Last Vitals:  Vitals:   03/08/18 0110 03/08/18 0455  BP: 127/77 115/61  Pulse: 78 62  Resp: 17 18  Temp: 36.8 C 36.8 C  SpO2: 98% 100%    Last Pain:  Vitals:   03/08/18 0550  TempSrc:   PainSc: 9                  Dilyn Smiles S

## 2018-03-08 NOTE — Care Management Note (Signed)
Case Management Note  Patient Details  Name: Julie [name] MRN: [id number] Date of Birth: Jul 18, 1971  Subjective/Objective:  PT-recc youth rw, youth 3n1-[facility] dme rep Santiago Glad aware of order, & d/c to deliver to rm prior d/c. Patient will f/u w/Surgeon for appt.                  Action/Plan:d/c home.   Expected Discharge Date:  03/08/18               Expected Discharge Plan:  Home/Self Care  In-House Referral:     Discharge planning Services  CM Consult  Post Acute Care Choice:    Choice offered to:     DME Arranged:  3-N-1, [facility] youth DME Agency:  [place]:    [place] Agency:     Status of Service:  Completed, signed off  If discussed at [employer] of Stay Meetings, dates discussed:    Additional Comments:  Dessa Phi, RN 03/08/2018, 12:33 PM

## 2018-03-08 NOTE — Progress Notes (Signed)
Subjective: 1 Day Post-Op Procedure(s) (LRB): Right knee medial unicompartmental arthroplasty (Right) Patient reports pain as moderate.   Patient seen in rounds with Dr. Wynelle Link. Patient is well, but has had some minor complaints of pain in the knee, requiring pain medications Patient is ready to go home this afternoon after therapy  Objective: Vital signs in last 24 hours: Temp:  [97.3 F (36.3 C)-99 F (37.2 C)] 98.3 F (36.8 C) (04/04 0455) Pulse Rate:  [56-92] 62 (04/04 0455) Resp:  [10-21] 18 (04/04 0455) BP: (91-127)/(61-83) 115/61 (04/04 0455) SpO2:  [98 %-100 %] 100 % (04/04 0455) Weight:  [54.4 kg (120 lb)] 54.4 kg (120 lb) (04/03 1450)  Intake/Output from previous day:  Intake/Output Summary (Last 24 hours) at 03/08/2018 0845 Last data filed at 03/08/2018 0704 Gross per 24 hour  Intake 1872.5 ml  Output 2985 ml  Net -1112.5 ml    Intake/Output this shift: Total I/O In: -  Out: 700 [Urine:700]  Labs: Recent Labs    03/08/18 0518  HGB 9.6*   Recent Labs    03/08/18 0518  WBC 19.4*  RBC 3.24*  HCT 30.5*  PLT 367   Recent Labs    03/08/18 0518  NA 136  K 3.6  CL 103  CO2 24  BUN 5*  CREATININE 0.68  GLUCOSE 165*  CALCIUM 8.5*   No results for input(s): LABPT, INR in the last 72 hours.  EXAM: General - Patient is Alert, Appropriate and Oriented Extremity - Neurovascular intact Sensation intact distally Intact pulses distally Dorsiflexion/Plantar flexion intact Dressing - clean, dry Motor Function - intact, moving foot and toes well on exam.  Hemovac pulled without difficulty.  Assessment/Plan: 1 Day Post-Op Procedure(s) (LRB): Right knee medial unicompartmental arthroplasty (Right) Procedure(s) (LRB): Right knee medial unicompartmental arthroplasty (Right) Past Medical History:  Diagnosis Date  . Anger   . Anxiety   . Aortic insufficiency 2006   Noted at heart cath - mild to moderate  . Arthritis   . Asthma   . Back pain   .  BV (bacterial vaginosis) 05/28/2015  . Chronic diarrhea   . Chronic headache   . Depression   . Diarrhea 05/28/2015  . Dyslipidemia 01/19/2016  . GERD (gastroesophageal reflux disease)   . History of kidney stones    history of  . History of nuclear stress test 07/15/2008   lexiscan; mild-mod perfusion defect in mid anteroseptal, apical anterior, apical septal regions (attenuation artifiact), prominent gut uptake activity in infero-apical region; post-stress EF 64%; EKG negative for ischemia; patient experienced CP during test  . Hx of cardiac catheterization 2006   no significant CAD  . IBS (irritable bowel syndrome)   . Irritable bowel syndrome   . Nausea 05/28/2015  . Neck pain   . Numerous moles 01/18/2016  . RLQ abdominal pain 05/28/2015  . Seizures ([place])    2 years ago; unknown etiology and none since then and on no meds  . Vaginal discharge 05/28/2015  . Weight gain 01/18/2016   Principal Problem:   OA (osteoarthritis) of knee  Estimated body mass index is 22.67 kg/m as calculated from the following:   Height as of this encounter: 5\' 1"  (1.549 m).   Weight as of this encounter: 54.4 kg (120 lb). Up with therapy Diet - Cardiac diet and Diabetic diet Follow up - in 2 weeks Activity - WBAT Dressing - May remove the surgical dressing tomorrow at home and then apply a dry gauze dressing daily. May shower  three days following surgery but do not submerge the incision under water. Disposition - Home Condition Upon Discharge - home when stable D/C Meds - See DC Summary DVT Prophylaxis Xarelto 10 mg daily for ten days, then change to Aspirin 325 mg daily for two weeks, then reduce to Baby Aspirin 81 mg daily for three additional weeks.  Arlee Muslim, PA-C Orthopaedic Surgery 03/08/2018, 8:45 AM

## 2018-03-08 NOTE — Progress Notes (Signed)
Performed discharge education to patient to include home care and safety, pain control, incision/dressing care, and constipation prevention.  Pt verbalized understanding.  No questions at this time.  Youth rolling walker and 3-in-1 commode at bedside discharged with pt.

## 2018-03-08 NOTE — Progress Notes (Signed)
Physical Therapy Treatment Patient Details Name: Julie [name] MRN: [id number] DOB: 1971-04-21 Today's Date: 03/08/2018    History of Present Illness R UKR    PT Comments    The patient tolerated exercises and ambulation. Plans Dc this PM.   Follow Up Recommendations  Follow surgeon's recommendation for DC plan and follow-up therapies     Equipment Recommendations  Rolling walker with 5" wheels;3in1 (PT)    Recommendations for Other Services       Precautions / Restrictions Precautions Precautions: Knee;Fall Required Braces or Orthoses: Knee Immobilizer - Right Knee Immobilizer - Right: Discontinue once straight leg raise with < 10 degree lag Restrictions Weight Bearing Restrictions: No    Mobility  Bed Mobility Overal bed mobility: Needs Assistance Bed Mobility: Sit to Supine     Supine to sit: Min assist     General bed mobility comments: assist with right leg  Transfers Overall transfer level: Needs assistance Equipment used: Rolling walker (2 wheeled) Transfers: Sit to/from Stand Sit to Stand: Supervision         General transfer comment: cues for technique and hand placement and left leg   Ambulation/Gait Ambulation/Gait assistance: Min guard Ambulation Distance (Feet): 60 Feet Assistive device: Rolling walker (2 wheeled) Gait Pattern/deviations: Step-to pattern;Decreased step length - right;Decreased stance time - right     General Gait Details: increased weight on the right leg/ this visit   Stairs            Wheelchair Mobility    Modified Rankin (Stroke Patients Only)       Balance                                            Cognition Arousal/Alertness: Awake/alert Behavior During Therapy: WFL for tasks assessed/performed Overall Cognitive Status: Within Functional Limits for tasks assessed                                        Exercises Total Joint Exercises Ankle Circles/Pumps:  AROM;Both;5 reps Quad Sets: AROM;Both;5 reps Heel Slides: AAROM;Right;5 reps Hip ABduction/ADduction: AAROM;Right;5 reps Straight Leg Raises: AAROM;Right;5 reps    General Comments        Pertinent Vitals/Pain Pain Assessment: 0-10 Pain Score: 8  Pain Location: right knee Pain Descriptors / Indicators: Aching;Discomfort;Grimacing Pain Intervention(s): Monitored during session;Premedicated before session;Ice applied    Home Living Family/patient expects to be discharged to:: Private residence Living Arrangements: Children;Non-relatives/Friends Available Help at Discharge: Family Type of Home: House Home Access: Stairs to enter   Home Layout: Multi-level Home Equipment: Kasandra Knudsen - single point Additional Comments: can go into level and not have to  negotiate steps.    Prior Function Level of Independence: Independent          PT Goals (current goals can now be found in the care plan section) Acute Rehab PT Goals [name] PT Goal Formulation: With patient Time For Goal Achievement: 03/10/18 Potential to Achieve Goals: Good Progress towards PT goals: Progressing toward goals    Frequency    7X/week      PT Plan Current plan remains appropriate    Co-evaluation              AM-PAC PT "6 Clicks" Daily Activity  Outcome Measure  Difficulty  turning over in bed (including adjusting bedclothes, sheets and blankets)?: A Little Difficulty moving from lying on back to sitting on the side of the bed? : A Little Difficulty sitting down on and standing up from a chair with arms (e.g., wheelchair, bedside commode, etc,.)?: A Little Help needed moving to and from a bed to chair (including a wheelchair)?: A Little Help needed walking in hospital room?: A Lot Help needed climbing 3-5 steps with a railing? : A Lot 6 Click Score: 16    End of Session Equipment Utilized During Treatment: Right knee immobilizer Activity Tolerance: Patient tolerated  treatment well Patient left: in bed;with call bell/phone within reach;with bed alarm set Nurse Communication: Mobility status PT Visit Diagnosis: Unsteadiness on feet (R26.81)     Time: 0102-7253 PT Time Calculation (min) (ACUTE ONLY): 23 min  Charges:  $Gait Training: 8-22 mins $Therapeutic Exercise: 8-22 mins                    G CodesTresa Endo PT [phone]    Claretha Cooper 03/08/2018, 12:40 PM

## 2018-03-08 NOTE — Discharge Instructions (Signed)
Dr. Gaynelle Arabian Total Joint Specialist [facility] [address], [place], [place] [address] [phone]  UNI KNEE REPLACEMENT POSTOPERATIVE DIRECTIONS   Knee Rehabilitation, Guidelines Following Surgery  Results after knee surgery are often greatly improved when you follow the exercise, range of motion and muscle strengthening exercises prescribed by your doctor. Safety measures are also important to protect the knee from further injury. Any time any of these exercises cause you to have increased pain or swelling in your knee joint, decrease the amount until you are comfortable again and slowly increase them. If you have problems or questions, call your caregiver or physical therapist for advice.   HOME CARE INSTRUCTIONS  Remove items at home which could result in a fall. This includes throw rugs or furniture in walking pathways.   ICE to the affected knee every three hours for 30 minutes at a time and then as needed for pain and swelling.  Continue to use ice on the knee for pain and swelling from surgery. You may notice swelling that will progress down to the foot and ankle.  This is normal after surgery.  Elevate the leg when you are not up walking on it.    Continue to use the breathing machine which will help keep your temperature down.  It is common for your temperature to cycle up and down following surgery, especially at night when you are not up moving around and exerting yourself.  The breathing machine keeps your lungs expanded and your temperature down.  Do not place pillow under knee, focus on keeping the knee straight while resting  DIET You may resume your previous home diet once your are discharged from the hospital.  DRESSING / WOUND CARE / SHOWERING You may shower 3 days after surgery, but keep the wounds dry during showering.  You may use an occlusive plastic wrap (Press'n Seal for example), NO SOAKING/SUBMERGING IN THE BATHTUB.  If the bandage gets  wet, change with a clean dry gauze.  If the incision gets wet, pat the wound dry with a clean towel. Leave the surgical dressing on the knee for 48 hours.  May remove the dressing on the second day.  Remove the Ace Wrap along with the cotton padding.  Leave the steri-strip bandaids in place along the incision on the skin.  Cover the incision each day with some dry gauze and paper tape. You may start showering once you are discharged home but do not submerge the incision under water. Just pat the incision dry and apply a dry gauze dressing on daily. Change the surgical dressing daily and reapply a dry dressing each time.  ACTIVITY Walk with your walker as instructed. Use walker as long as suggested by your caregivers. Avoid periods of inactivity such as sitting longer than an hour when not asleep. This helps prevent blood clots.  You may resume a sexual relationship in one month or when given the OK by your doctor.  You may return to work once you are cleared by your doctor.  Do not drive a car for 6 weeks or until released by you surgeon.  Do not drive while taking narcotics.  WEIGHT BEARING Weight bearing as tolerated with assist device (walker, cane, etc) as directed, use it as long as suggested by your surgeon or therapist, typically at least 4-6 weeks.  POSTOPERATIVE CONSTIPATION PROTOCOL Constipation - defined medically as fewer than three stools per week and severe constipation as less than one stool per week.  One of the most common issues patients have following surgery is constipation.  Even if you have a regular bowel pattern at home, your normal regimen is likely to be disrupted due to multiple reasons following surgery.  Combination of anesthesia, postoperative narcotics, change in appetite and fluid intake all can affect your bowels.  In order to avoid complications following surgery, here are some recommendations in order to help you during your recovery period.  Colace (docusate)  - Pick up an over-the-counter form of Colace or another stool softener and take twice a day as long as you are requiring postoperative pain medications.  Take with a full glass of water daily.  If you experience loose stools or diarrhea, hold the colace until you stool forms back up.  If your symptoms do not get better within 1 week or if they get worse, check with your doctor.  Dulcolax (bisacodyl) - Pick up over-the-counter and take as directed by the product packaging as needed to assist with the movement of your bowels.  Take with a full glass of water.  Use this product as needed if not relieved by Colace only.   MiraLax (polyethylene glycol) - Pick up over-the-counter to have on hand.  MiraLax is a solution that will increase the amount of water in your bowels to assist with bowel movements.  Take as directed and can mix with a glass of water, juice, soda, coffee, or tea.  Take if you go more than two days without a movement. Do not use MiraLax more than once per day. Call your doctor if you are still constipated or irregular after using this medication for 7 days in a row.  If you continue to have problems with postoperative constipation, please contact the office for further assistance and recommendations.  If you experience "the worst abdominal pain ever" or develop nausea or vomiting, please contact the office immediatly for further recommendations for treatment.  ITCHING  If you experience itching with your medications, try taking only a single pain pill, or even half a pain pill at a time.  You can also use Benadryl over the counter for itching or also to help with sleep.   TED HOSE STOCKINGS Wear the elastic stockings on both legs for three weeks following surgery during the day but you may remove then at night for sleeping.  MEDICATIONS See your medication summary on the After Visit Summary that the nursing staff will review with you prior to discharge.  You may have some home  medications which will be placed on hold until you complete the course of blood thinner medication.  It is important for you to complete the blood thinner medication as prescribed by your surgeon.  Continue your approved medications as instructed at time of discharge.  PRECAUTIONS If you experience chest pain or shortness of breath - call 911 immediately for transfer to the hospital emergency department.  If you develop a fever greater that 101 F, purulent drainage from wound, increased redness or drainage from wound, foul odor from the wound/dressing, or calf pain - CONTACT YOUR SURGEON.                                                   FOLLOW-UP APPOINTMENTS Make sure you keep all of your appointments after your operation with your surgeon and caregivers. You should call  the office at the above phone number and make an appointment for approximately two weeks after the date of your surgery or on the date instructed by your surgeon outlined in the "After Visit Summary".  RANGE OF MOTION AND STRENGTHENING EXERCISES  Rehabilitation of the knee is important following a knee injury or an operation. After just a few days of immobilization, the muscles of the thigh which control the knee become weakened and shrink (atrophy). Knee exercises are designed to build up the tone and strength of the thigh muscles and to improve knee motion. Often times heat used for twenty to thirty minutes before working out will loosen up your tissues and help with improving the range of motion but do not use heat for the first two weeks following surgery. These exercises can be done on a training (exercise) mat, on the floor, on a table or on a bed. Use what ever works the best and is most comfortable for you Knee exercises include:  Leg Lifts - While your knee is still immobilized in a splint or cast, you can do straight leg raises. Lift the leg to 60 degrees, hold for 3 sec, and slowly lower the leg. Repeat 10-20 times 2-3 times  daily. Perform this exercise against resistance later as your knee gets better.  Quad and Hamstring Sets - Tighten up the muscle on the front of the thigh (Quad) and hold for 5-10 sec. Repeat this 10-20 times hourly. Hamstring sets are done by pushing the foot backward against an object and holding for 5-10 sec. Repeat as with quad sets.   Leg Slides: Lying on your back, slowly slide your foot toward your buttocks, bending your knee up off the floor (only go as far as is comfortable). Then slowly slide your foot back down until your leg is flat on the floor again.  Angel Wings: Lying on your back spread your legs to the side as far apart as you can without causing discomfort.  A rehabilitation program following serious knee injuries can speed recovery and prevent re-injury in the future due to weakened muscles. Contact your doctor or a physical therapist for more information on knee rehabilitation.   IF YOU ARE TRANSFERRED TO A SKILLED REHAB FACILITY If the patient is transferred to a skilled rehab facility following release from the hospital, a list of the current medications will be sent to the facility for the patient to continue.  When discharged from the skilled rehab facility, please have the facility set up the patient's [place] prior to being released. Also, the skilled facility will be responsible for providing the patient with their medications at time of release from the facility to include their pain medication, the muscle relaxants, and their blood thinner medication. If the patient is still at the rehab facility at time of the two week follow up appointment, the skilled rehab facility will also need to assist the patient in arranging follow up appointment in our office and any transportation needs.  MAKE SURE YOU:  Understand these instructions.  Get help right away if you are not doing well or get worse.    Pick up stool softner and laxative for home use following  surgery while on pain medications. Do not submerge incision under water. Please use good hand washing techniques while changing dressing each day. May shower starting three days after surgery. Please use a clean towel to pat the incision dry following showers. Continue to use ice for pain and swelling  after surgery. Do not use any lotions or creams on the incision until instructed by your surgeon.   Take Xarelto 10 mg daily for ten days, then change to Aspirin 325 mg daily for two weeks, then reduce to Baby Aspirin 81 mg daily for three additional weeks.    Information on my medicine - XARELTO (Rivaroxaban)  Why was Xarelto prescribed for you? Xarelto was prescribed for you to reduce the risk of blood clots forming after orthopedic surgery. The medical term for these abnormal blood clots is venous thromboembolism (VTE).  What do you need to know about xarelto ? Take your Xarelto ONCE DAILY at the same time every day. You may take it either with or without food.  If you have difficulty swallowing the tablet whole, you may crush it and mix in applesauce just prior to taking your dose.  Take Xarelto exactly as prescribed by your doctor and DO NOT stop taking Xarelto without talking to the doctor who prescribed the medication.  Stopping without other VTE prevention medication to take the place of Xarelto may increase your risk of developing a clot.  After discharge, you should have regular check-up appointments with your healthcare provider that is prescribing your Xarelto.    What do you do if you miss a dose? If you miss a dose, take it as soon as you remember on the same day then continue your regularly scheduled once daily regimen the next day. Do not take two doses of Xarelto on the same day.   Important Safety Information A possible side effect of Xarelto is bleeding. You should call your healthcare provider right away if you experience any of the following: ? Bleeding  from an injury or your nose that does not stop. ? Unusual colored urine (red or dark brown) or unusual colored stools (red or black). ? Unusual bruising for unknown reasons. ? A serious fall or if you hit your head (even if there is no bleeding).  Some medicines may interact with Xarelto and might increase your risk of bleeding while on Xarelto. To help avoid this, consult your healthcare provider or pharmacist prior to using any new prescription or non-prescription medications, including herbals, vitamins, non-steroidal anti-inflammatory drugs (NSAIDs) and supplements.  This website has more information on Xarelto: [email or web address]

## 2018-03-08 NOTE — Discharge Summary (Signed)
Physician Discharge Summary   Patient ID: Julie [name] MRN: [id number] DOB/AGE: 49-Apr-1972 49 y.o.  Admit date: 03/07/2018 Discharge date: 03-08-2018  Primary Diagnosis:  Medial compartment osteoarthritis, Left knee  Admission Diagnoses:  Past Medical History:  Diagnosis Date  . Anger   . Anxiety   . Aortic insufficiency 2006   Noted at heart cath - mild to moderate  . Arthritis   . Asthma   . Back pain   . BV (bacterial vaginosis) 05/28/2015  . Chronic diarrhea   . Chronic headache   . Depression   . Diarrhea 05/28/2015  . Dyslipidemia 01/19/2016  . GERD (gastroesophageal reflux disease)   . History of kidney stones    history of  . History of nuclear stress test 07/15/2008   lexiscan; mild-mod perfusion defect in mid anteroseptal, apical anterior, apical septal regions (attenuation artifiact), prominent gut uptake activity in infero-apical region; post-stress EF 64%; EKG negative for ischemia; patient experienced CP during test  . Hx of cardiac catheterization 2006   no significant CAD  . IBS (irritable bowel syndrome)   . Irritable bowel syndrome   . Nausea 05/28/2015  . Neck pain   . Numerous moles 01/18/2016  . RLQ abdominal pain 05/28/2015  . Seizures ([place])    2 years ago; unknown etiology and none since then and on no meds  . Vaginal discharge 05/28/2015  . Weight gain 01/18/2016   Discharge Diagnoses:   Principal Problem:   OA (osteoarthritis) of knee  Estimated body mass index is 22.67 kg/m as calculated from the following:   Height as of this encounter: _0  (1.549 m).   Weight as of this encounter: 54.4 kg (120 lb).  Procedure:  Procedure(s) (LRB): Right knee medial unicompartmental arthroplasty (Right)   Consults: None  HPI: Julie [name] is a 47 y.o. female, who has  significant isolated medial compartment arthritis of the Left knee. The patient has had nonoperative management including injections of cortisone and viscous supplements. Unfortunately,  the pain persists.  Radiograph showed isolated medial compartment  arthritis  with normal-appearing patellofemoral and lateral compartments. The patient presents now for left knee unicompartmental arthroplasty.    Laboratory Data: Admission on 03/07/2018  Component Date Value Ref Range Status  . WBC 03/08/2018 19.4* 4.0 - 10.5 K/uL Final  . RBC 03/08/2018 3.24* 3.87 - 5.11 MIL/uL Final  . Hemoglobin 03/08/2018 9.6* 12.0 - 15.0 g/dL Final  . HCT 03/08/2018 30.5* 36.0 - 46.0 % Final  . MCV 03/08/2018 94.1  78.0 - 100.0 fL Final  . MCH 03/08/2018 29.6  26.0 - 34.0 pg Final  . MCHC 03/08/2018 31.5  30.0 - 36.0 g/dL Final  . RDW 03/08/2018 14.5  11.5 - 15.5 % Final  . Platelets 03/08/2018 367  150 - 400 K/uL Final   Performed at [facility], [place] [address] [place], [place] [address]  . Sodium 03/08/2018 136  135 - 145 mmol/L Final  . Potassium 03/08/2018 3.6  3.5 - 5.1 mmol/L Final  . Chloride 03/08/2018 103  101 - 111 mmol/L Final  . CO2 03/08/2018 24  22 - 32 mmol/L Final  . Glucose, Bld 03/08/2018 165* 65 - 99 mg/dL Final  . BUN 03/08/2018 5* 6 - 20 mg/dL Final  . Creatinine, Ser 03/08/2018 0.68  0.44 - 1.00 mg/dL Final  . Calcium 03/08/2018 8.5* 8.9 - 10.3 mg/dL Final  . GFR calc non Af Amer 03/08/2018 >60  >60 mL/min Final  . GFR calc Af Wyvonnia Lora  03/08/2018 >60  >60 mL/min Final   Comment: (NOTE) The eGFR has been calculated using the CKD EPI equation. This calculation has not been validated in all clinical situations. eGFR's persistently <60 mL/min signify possible Chronic Kidney Disease.   Georgiann Hahn gap 03/08/2018 9  5 - 15 Final   Performed at [facility], [place] [address] [place], [place] [address]  Office Visit on 03/05/2018  Component Date Value Ref Range Status  . Source Wet Prep POC 03/05/2018 vagina   Final  . WBC, Wet Prep HPF POC 03/05/2018 few   Final  . Clue Cells Wet Prep Whiff POC 03/05/2018 Negative Whiff   Final  .  Trichomonas Wet Prep HPF POC 03/05/2018 Absent  Absent Final  Hospital Outpatient Visit on 03/02/2018  Component Date Value Ref Range Status  . aPTT 03/02/2018 25  24 - 36 seconds Final   Performed at [facility], [place] [address] [place], [place] [address]  . WBC 03/02/2018 13.0* 4.0 - 10.5 K/uL Final  . RBC 03/02/2018 3.80* 3.87 - 5.11 MIL/uL Final  . Hemoglobin 03/02/2018 11.2* 12.0 - 15.0 g/dL Final  . HCT 03/02/2018 35.3* 36.0 - 46.0 % Final  . MCV 03/02/2018 92.9  78.0 - 100.0 fL Final  . MCH 03/02/2018 29.5  26.0 - 34.0 pg Final  . MCHC 03/02/2018 31.7  30.0 - 36.0 g/dL Final  . RDW 03/02/2018 14.5  11.5 - 15.5 % Final  . Platelets 03/02/2018 426* 150 - 400 K/uL Final   Performed at [facility], [place] [address], [place], [place] [address]  . Sodium 03/02/2018 136  135 - 145 mmol/L Final  . Potassium 03/02/2018 3.5  3.5 - 5.1 mmol/L Final  . Chloride 03/02/2018 99* 101 - 111 mmol/L Final  . CO2 03/02/2018 27  22 - 32 mmol/L Final  . Glucose, Bld 03/02/2018 94  65 - 99 mg/dL Final  . BUN 03/02/2018 5* 6 - 20 mg/dL Final  . Creatinine, Ser 03/02/2018 0.60  0.44 - 1.00 mg/dL Final  . Calcium 03/02/2018 9.2  8.9 - 10.3 mg/dL Final  . Total Protein 03/02/2018 8.0  6.5 - 8.1 g/dL Final  . Albumin 03/02/2018 4.1  3.5 - 5.0 g/dL Final  . AST 03/02/2018 19  15 - 41 U/L Final  . ALT 03/02/2018 12* 14 - 54 U/L Final  . Alkaline Phosphatase 03/02/2018 85  38 - 126 U/L Final  . Total Bilirubin 03/02/2018 0.4  0.3 - 1.2 mg/dL Final  . GFR calc non Af Amer 03/02/2018 >60  >60 mL/min Final  . GFR calc Af Amer 03/02/2018 >60  >60 mL/min Final   Comment: (NOTE) The eGFR has been calculated using the CKD EPI equation. This calculation has not been validated in all clinical situations. eGFR's persistently <60 mL/min signify possible Chronic Kidney Disease.   Georgiann Hahn gap 03/02/2018 10  5 - 15 Final   Performed at [facility], [place]  [address], [place], [place] [address]  . Prothrombin Time 03/02/2018 12.8  11.4 - 15.2 seconds Final  . INR 03/02/2018 0.97   Final   Performed at [facility], [place] [address] [place], [place] [address]  . ABO/RH(D) 03/02/2018 O POS   Final  . Antibody Screen 03/02/2018 NEG   Final  . Sample Expiration 03/02/2018 03/10/2018   Final  . Extend sample reason 03/02/2018    Final  Value:NO TRANSFUSIONS OR PREGNANCY IN THE PAST 3 MONTHS Performed at [place] [address] [place], [place] [address]   . MRSA, PCR 03/02/2018 POSITIVE* NEGATIVE Final   Comment: RESULT CALLED TO, READ BACK BY AND VERIFIED WITH: STANLEY, K. _0  ON 3.29.19 BY NMCCOY   . Staphylococcus aureus 03/02/2018 POSITIVE* NEGATIVE Final   Comment: (NOTE) The Xpert SA Assay (FDA approved for NASAL specimens in patients 58 years of age and older), is one component of a comprehensive surveillance program. It is not intended to diagnose infection nor to guide or monitor treatment. Performed at [facility], [place] [address], [place], [place] [address]   . Preg Test, Ur 03/02/2018 NEGATIVE  NEGATIVE Final   Comment:        THE SENSITIVITY OF THIS METHODOLOGY IS >20 mIU/mL. Performed at [facility], [place] [address] [place], [place] [address]   . ABO/RH(D) 03/02/2018    Final                   Value:O POS Performed at [facility], [place] [address] [place], [place] [address]   Office Visit on 01/15/2018  Component Date Value Ref Range Status  . Atopobium vaginae 01/15/2018 High - 2* Score Final  . BVAB 2 01/15/2018 High - 2* Score Final  . Megasphaera 1 01/15/2018 Low - 0  Score Final   Comment: Calculate total score by adding the 3 individual bacterial vaginosis (BV) marker scores together.  Total score is interpreted as follows: Total score 0-1: Indicates the absence of BV. Total score   2:  Indeterminate for BV. Additional clinical                  data should be evaluated to establish a                  diagnosis. Total score 3-6: Indicates the presence of BV. This test was developed and its performance characteristics determined by [facility].  It has not been cleared or approved by the Food and Drug Administration.  The FDA has determined that such clearance or approval is not necessary.   . Candida albicans, NAA 01/15/2018 Positive* Negative Final  . Candida glabrata, NAA 01/15/2018 Positive* Negative Final   Comment: This test was developed and its performance characteristics determined by [facility].  It has not been cleared or approved by the Food and Drug Administration.  The FDA has determined that such clearance or approval is not necessary. Published data demonstrate that up to 65% of Candida glabrata identified in cases of vaginal candidiasis have decreased susceptibility to fluconazole.   . Trich vag by NAA 01/15/2018 Positive* Negative Final  . Chlamydia trachomatis, NAA 01/15/2018 Negative  Negative Final  . Neisseria gonorrhoeae, NAA 01/15/2018 Negative  Negative Final     X-Rays:No results found.  EKG: Orders placed or performed during the hospital encounter of 01/10/18  . EKG 12-Lead  . EKG 12-Lead  . EKG     Hospital Course: Julie [name] is a 47 y.o. who was admitted to [facility]. They were brought to the operating room on 03/07/2018 and underwent Procedure(s): Right knee medial unicompartmental arthroplasty.  Patient tolerated the procedure well and was later transferred to the recovery room and then to the orthopaedic floor for postoperative care.  They were given PO and IV analgesics for pain control following their surgery.  They were given 24 hours of postoperative antibiotics of  Anti-infectives (From admission, onward)   Start     Dose/Rate Route Frequency Ordered Stop   03/08/18 0600  vancomycin (VANCOCIN) IVPB 1000 mg/200 mL premix      1,000 mg 200 mL/hr over 60 Minutes Intravenous On call to O.R. 03/07/18 1411 03/07/18 1644   03/08/18 0400  vancomycin (VANCOCIN) IVPB 1000 mg/200 mL premix     1,000 mg 200 mL/hr over 60 Minutes Intravenous Every 12 hours 03/07/18 1902 03/08/18 0400     and started on DVT prophylaxis in the form of Xarelto.   PT and OT were ordered for postop therapy protocol.  Discharge planning consulted to help with postop disposition and equipment needs.  Patient had a tough night on the evening of surgery with pain.  They started to get up OOB with therapy on day one. Hemovac drain was pulled without difficulty.  Patient was seen in rounds on day one and it was felt that as long as they did well with the remaining sessions of therapy that they would be ready to go home.  Arrangements were made and they were setup to go home on POD 1.  Diet - Cardiac diet and Diabetic diet Follow up - in 2 weeks Activity - WBAT Dressing - May remove the surgical dressing tomorrow at home and then apply a dry gauze dressing daily. May shower three days following surgery but do not submerge the incision under water. Disposition - Home Condition Upon Discharge - home when stable D/C Meds - See DC Summary DVT Prophylaxis Xarelto 10 mg daily for ten days, then change to Aspirin 325 mg daily for two weeks, then reduce to Baby Aspirin 81 mg daily for three additional weeks.    Discharge Instructions    Call MD / Call 911   Complete by:  As directed    If you experience chest pain or shortness of breath, CALL 911 and be transported to the hospital emergency room.  If you develope a fever above 101 F, pus (white drainage) or increased drainage or redness at the wound, or calf pain, call your surgeon's office.   Change dressing   Complete by:  As directed    Change dressing daily with sterile 4 x 4 inch gauze dressing and apply TED hose. Do not submerge the incision under water.   Constipation Prevention   Complete by:  As  directed    Drink plenty of fluids.  Prune juice may be helpful.  You may use a stool softener, such as Colace (over the counter) 100 mg twice a day.  Use MiraLax (over the counter) for constipation as needed.   Diet - low sodium heart healthy   Complete by:  As directed    Diet Carb Modified   Complete by:  As directed    Discharge instructions   Complete by:  As directed    Xarelto 10 mg daily for ten days, then change to Aspirin 325 mg daily for two weeks, then reduce to Baby Aspirin 81 mg daily for three additional weeks.   Pick up stool softner and laxative for home use following surgery while on pain medications. Do not submerge incision under water. Please use good hand washing techniques while changing dressing each day. May shower starting three days after surgery. Please use a clean towel to pat the incision dry following showers. Continue to use ice for pain and swelling after surgery. Do not use any lotions or creams on the incision until instructed by your surgeon.  Wear both TED hose on both legs during the day every day for three weeks, but may remove the TED hose at night at home.  Postoperative Constipation Protocol  Constipation - defined medically as fewer than three stools per week and severe constipation as less than one stool per week.  One of the most common issues patients have following surgery is constipation.  Even if you have a regular bowel pattern at home, your normal regimen is likely to be disrupted due to multiple reasons following surgery.  Combination of anesthesia, postoperative narcotics, change in appetite and fluid intake all can affect your bowels.  In order to avoid complications following surgery, here are some recommendations in order to help you during your recovery period.  Colace (docusate) - Pick up an over-the-counter form of Colace or another stool softener and take twice a day as long as you are requiring postoperative pain medications.   Take with a full glass of water daily.  If you experience loose stools or diarrhea, hold the colace until you stool forms back up.  If your symptoms do not get better within 1 week or if they get worse, check with your doctor.  Dulcolax (bisacodyl) - Pick up over-the-counter and take as directed by the product packaging as needed to assist with the movement of your bowels.  Take with a full glass of water.  Use this product as needed if not relieved by Colace only.   MiraLax (polyethylene glycol) - Pick up over-the-counter to have on hand.  MiraLax is a solution that will increase the amount of water in your bowels to assist with bowel movements.  Take as directed and can mix with a glass of water, juice, soda, coffee, or tea.  Take if you go more than two days without a movement. Do not use MiraLax more than once per day. Call your doctor if you are still constipated or irregular after using this medication for 7 days in a row.  If you continue to have problems with postoperative constipation, please contact the office for further assistance and recommendations.  If you experience "the worst abdominal pain ever" or develop nausea or vomiting, please contact the office immediatly for further recommendations for treatment.   Do not put a pillow under the knee. Place it under the heel.   Complete by:  As directed    Do not sit on low chairs, stoools or toilet seats, as it may be difficult to get up from low surfaces   Complete by:  As directed    Driving restrictions   Complete by:  As directed    No driving until released by the physician.   Increase activity slowly as tolerated   Complete by:  As directed    Lifting restrictions   Complete by:  As directed    No lifting until released by the physician.   Patient may shower   Complete by:  As directed    You may shower without a dressing once there is no drainage.  Do not wash over the wound.  If drainage remains, do not shower until drainage  stops.   TED hose   Complete by:  As directed    Use stockings (TED hose) for 3 weeks on both leg(s).  You may remove them at night for sleeping.   Weight bearing as tolerated   Complete by:  As directed    Laterality:  right   Extremity:  Lower     Allergies as  of 03/08/2018      Reactions   Dicyclomine Palpitations   Patient states that her heart rate will go real fast when she takes this medication.   Flagyl [metronidazole Hcl] Nausea And Vomiting   Metronidazole    Morphine And Related Other (See Comments)   Headache    Penicillins Other (See Comments)   Caused patient to pass out Has patient had a PCN reaction causing immediate rash, facial/tongue/throat swelling, SOB or lightheadedness with hypotension: unknown Has patient had a PCN reaction causing severe rash involving mucus membranes or skin necrosis: No Has patient had a PCN reaction that required hospitalization No Has patient had a PCN reaction occurring within the last 10 years: No If all of the above answers are "NO", then may proceed with Cephalosporin use.      Medication List    STOP taking these medications   Ascorbic Acid 500 MG Caps   Cholecalciferol 5000 units capsule   multivitamin with minerals Tabs tablet   progesterone 200 MG capsule Commonly known as:  PROMETRIUM     TAKE these medications   albuterol 108 (90 Base) MCG/ACT inhaler Commonly known as:  PROVENTIL HFA;VENTOLIN HFA Inhale 2 puffs into the lungs every 6 (six) hours as needed for wheezing or shortness of breath.   butalbital-acetaminophen-caffeine 50-325-40 MG tablet Commonly known as:  FIORICET, ESGIC Take 1 tablet by mouth every 6 (six) hours as needed for headache.   cetirizine 10 MG tablet Commonly known as:  ZYRTEC Take 10 mg by mouth daily.   citalopram 40 MG tablet Commonly known as:  CELEXA Take 1 tablet (40 mg total) by mouth daily.   fluticasone 50 MCG/ACT nasal spray Commonly known as:  FLONASE Place 2 sprays into  both nostrils daily.   hyoscyamine 0.125 MG SL tablet Commonly known as:  LEVSIN SL Place 1 tablet (0.125 mg total) under the tongue every 4 (four) hours as needed.   LORazepam 0.5 MG tablet Commonly known as:  ATIVAN Take 1 bid prn anxiety   methocarbamol 500 MG tablet Commonly known as:  ROBAXIN Take 1 tablet (500 mg total) by mouth every 6 (six) hours as needed for muscle spasms.   ondansetron 8 MG tablet Commonly known as:  ZOFRAN TAKE 1 TABLET EVERY 8 HOURS AS NEEDED FOR NAUSEA OR VOMITING.   oxyCODONE 5 MG immediate release tablet Commonly known as:  Oxy IR/ROXICODONE Take 1-2 tablets (5-10 mg total) by mouth every 4 (four) hours as needed for moderate pain or severe pain.   pantoprazole 20 MG tablet Commonly known as:  PROTONIX TAKE (1) TABLET TWICE A DAY BEFORE MEALS.   pseudoephedrine-acetaminophen 30-500 MG Tabs tablet Commonly known as:  TYLENOL SINUS Take 1 tablet by mouth every 4 (four) hours as needed.   rivaroxaban 10 MG Tabs tablet Commonly known as:  XARELTO Take 1 tablet (10 mg total) by mouth daily with breakfast. Xarelto 10 mg daily for ten days, then change to Aspirin 325 mg daily for two weeks, then reduce to Baby Aspirin 81 mg daily for three additional weeks.            Discharge Care Instructions  (From admission, onward)        Start     Ordered   03/08/18 0000  Weight bearing as tolerated    Question Answer Comment  Laterality right   Extremity Lower      03/08/18 0850   03/08/18 0000  Change dressing    Comments:  Change  dressing daily with sterile 4 x 4 inch gauze dressing and apply TED hose. Do not submerge the incision under water.   03/08/18 0850     Follow-up Information    Gaynelle Arabian, MD. Schedule an appointment as soon as possible for a visit on 03/20/2018.   Specialty:  Orthopedic Surgery Contact information: [address] [place] [address] [phone]           Signed: Arlee Muslim,  PA-C Orthopaedic Surgery 03/08/2018, 8:51 AM

## 2018-03-09 ENCOUNTER — Encounter (HOSPITAL_COMMUNITY): Payer: Self-pay | Admitting: Orthopedic Surgery

## 2018-03-14 DIAGNOSIS — Z96651 Presence of right artificial knee joint: Secondary | ICD-10-CM | POA: Insufficient documentation

## 2018-03-22 ENCOUNTER — Encounter (HOSPITAL_COMMUNITY): Payer: Self-pay | Admitting: Orthopedic Surgery

## 2018-03-29 ENCOUNTER — Telehealth (HOSPITAL_COMMUNITY): Payer: Self-pay

## 2018-03-29 NOTE — Telephone Encounter (Signed)
Patient call with question about a referral. I returned her call and left a message for her to call us back so that we can better understand  what we need to do for her. WT

## 2018-04-04 ENCOUNTER — Other Ambulatory Visit: Payer: Self-pay | Admitting: Adult Health

## 2018-04-10 ENCOUNTER — Other Ambulatory Visit: Payer: Self-pay | Admitting: Adult Health

## 2018-04-11 ENCOUNTER — Ambulatory Visit (HOSPITAL_COMMUNITY): Payer: Medicaid Other

## 2018-04-11 ENCOUNTER — Telehealth (HOSPITAL_COMMUNITY): Payer: Self-pay

## 2018-04-11 ENCOUNTER — Encounter (HOSPITAL_COMMUNITY): Payer: Self-pay

## 2018-04-11 NOTE — Telephone Encounter (Signed)
04/11/18  pt left a message that she has been putting ice on her leg all day and it's been swollen all day.... doesn't know how she can do therapy if she can't walk on it

## 2018-04-12 ENCOUNTER — Telehealth: Payer: Self-pay | Admitting: *Deleted

## 2018-04-12 ENCOUNTER — Telehealth: Payer: Self-pay | Admitting: Adult Health

## 2018-04-12 NOTE — Telephone Encounter (Signed)
LMOM that Rx for lorazepam had been sent 03/05/2018 with 1 refill so pt should be good with Rx until the end of the month. Advised to call back with any further questions.

## 2018-04-12 NOTE — Telephone Encounter (Signed)
Pt called stating that she need a refill on her lorazepam. Advised that Anderson Malta said she will need to make an appt before she gives any more refills. Pt connected to scheduling

## 2018-04-18 ENCOUNTER — Ambulatory Visit (HOSPITAL_COMMUNITY): Payer: Medicaid Other | Attending: Orthopedic Surgery

## 2018-04-18 ENCOUNTER — Ambulatory Visit: Payer: Medicaid Other | Admitting: Adult Health

## 2018-04-19 ENCOUNTER — Encounter: Payer: Self-pay | Admitting: Adult Health

## 2018-04-19 ENCOUNTER — Ambulatory Visit: Payer: Medicaid Other | Admitting: Adult Health

## 2018-04-19 VITALS — BP 140/60 | HR 96 | Ht 61.0 in | Wt 119.5 lb

## 2018-04-19 DIAGNOSIS — J302 Other seasonal allergic rhinitis: Secondary | ICD-10-CM

## 2018-04-19 DIAGNOSIS — R232 Flushing: Secondary | ICD-10-CM | POA: Diagnosis not present

## 2018-04-19 DIAGNOSIS — F419 Anxiety disorder, unspecified: Secondary | ICD-10-CM | POA: Diagnosis not present

## 2018-04-19 MED ORDER — LORAZEPAM 0.5 MG PO TABS: ORAL_TABLET | ORAL | 1 refills | Status: DC

## 2018-04-19 NOTE — Progress Notes (Signed)
  Subjective:     Patient ID: Julie [name], female   DOB: 1971-10-25, 47 y.o.   MRN: [id number]  HPI Julie [name] is a 47 year old white female in requesting refills on ativan. She says she is anxious at times and has palpitations and has family stressors.   Review of Systems +anxiety with palpitation Left ear hurts,"feels like something bouncing in it" +hot flashes, since off HRT when had knee surgery  Reviewed past medical,surgical, social and family history. Reviewed medications and allergies.     Objective:   Physical Exam BP 140/60 (BP Location: Left Arm, Patient Position: Sitting, Cuff Size: Normal)   Pulse 96   Ht 5\' 1"  (1.549 m)   Wt 119 lb 8 oz (54.2 kg)   BMI 22.58 kg/m    PHQ 2 score 0. Skin warm and dry, ears clear, with pearly gray TM. Will refill ativan but really needs to see behavorial health provider for continued Rx. Change antihistamine, and if wants to restart HRT will call me.  Face time 15 minutes with 50% counseling and listening to her talk.  Assessment:     1. Anxiety   2. Hot flashes   3. Seasonal allergies       Plan:    Try allegra  Call if wants to go back on HRT She says she is going to see somebody in [place] about anxiety, did not know name of office but will let me know,they are new.  Meds ordered this encounter  Medications  . LORazepam (ATIVAN) 0.5 MG tablet    Sig: Take 1 bid prn anxiety    Dispense:  30 tablet    Refill:  1    Order Specific Question:   Supervising Provider    Answer:   Tania Ade H [[id number]]  F/U prn

## 2018-05-10 ENCOUNTER — Ambulatory Visit: Payer: Medicaid Other | Admitting: Neurology

## 2018-05-10 ENCOUNTER — Telehealth: Payer: Self-pay | Admitting: *Deleted

## 2018-05-10 NOTE — Telephone Encounter (Signed)
Pt no showed appt on 05/10/2018 @ 2:30 pm.

## 2018-05-11 ENCOUNTER — Encounter: Payer: Self-pay | Admitting: Neurology

## 2018-05-11 ENCOUNTER — Other Ambulatory Visit: Payer: Self-pay | Admitting: Adult Health

## 2018-05-31 ENCOUNTER — Other Ambulatory Visit: Payer: Self-pay | Admitting: *Deleted

## 2018-06-01 MED ORDER — LORAZEPAM 0.5 MG PO TABS: ORAL_TABLET | ORAL | 0 refills | Status: DC

## 2018-06-05 ENCOUNTER — Telehealth: Payer: Self-pay | Admitting: Adult Health

## 2018-06-05 NOTE — Telephone Encounter (Signed)
Pt aware it was sent 6/28 and that is last refill, needs Mental health to manage

## 2018-06-08 NOTE — Progress Notes (Deleted)
Psychiatric Initial Adult Assessment   Patient Identification: Julie [name] MRN:  [id number] Date of Evaluation:  06/08/2018 Referral Source: *** Chief Complaint:   Visit Diagnosis: No diagnosis found.  History of Present Illness:   Julie [name] is a 47 y.o. year old female with a history of depression, anxiety, headache, who is referred for     Associated Signs/Symptoms: Depression Symptoms:  {DEPRESSION SYMPTOMS:20000} (Hypo) Manic Symptoms:  {BHH MANIC SYMPTOMS:22872} Anxiety Symptoms:  {BHH ANXIETY SYMPTOMS:22873} Psychotic Symptoms:  {BHH PSYCHOTIC SYMPTOMS:22874} PTSD Symptoms: {BHH PTSD SYMPTOMS:22875}  Past Psychiatric History:  Outpatient:  Psychiatry admission:  Previous suicide attempt:  Past trials of medication:  History of violence:   Previous Psychotropic Medications: {YES/NO:21197}  Substance Abuse History in the last 12 months:  {yes no:314532}  Consequences of Substance Abuse: {BHH CONSEQUENCES OF SUBSTANCE ABUSE:22880}  Past Medical History:  Past Medical History:  Diagnosis Date  . Anger   . Anxiety   . Aortic insufficiency 2006   Noted at heart cath - mild to moderate  . Arthritis   . Asthma   . Back pain   . BV (bacterial vaginosis) 05/28/2015  . Chronic diarrhea   . Chronic headache   . Depression   . Diarrhea 05/28/2015  . Dyslipidemia 01/19/2016  . GERD (gastroesophageal reflux disease)   . History of kidney stones    history of  . History of nuclear stress test 07/15/2008   lexiscan; mild-mod perfusion defect in mid anteroseptal, apical anterior, apical septal regions (attenuation artifiact), prominent gut uptake activity in infero-apical region; post-stress EF 64%; EKG negative for ischemia; patient experienced CP during test  . Hx of cardiac catheterization 2006   no significant CAD  . IBS (irritable bowel syndrome)   . Irritable bowel syndrome   . Nausea 05/28/2015  . Neck pain   . Numerous moles 01/18/2016  . RLQ abdominal  pain 05/28/2015  . Seizures ([place])    2 years ago; unknown etiology and none since then and on no meds  . Vaginal discharge 05/28/2015  . Weight gain 01/18/2016    Past Surgical History:  Procedure Laterality Date  . BIOPSY N/A 02/25/2014   Procedure: BIOPSY;  Surgeon: Rogene Houston, MD;  [facility];  Service: Endoscopy;  Laterality: N/A;  . BREAST ENHANCEMENT SURGERY    . CARDIAC CATHETERIZATION  1025//2006   no significant CAD, mild-mod depressed LV systolic function, EF 67-20%, mild-mod AI (Dr. Gerrie Nordmann)   . CARPAL TUNNEL RELEASE Bilateral 01/01/2016  . CESAREAN SECTION    . COLONOSCOPY  05/27/2011   snare polypectomy   . COLONOSCOPY WITH PROPOFOL N/A 10/21/2016   Procedure: COLONOSCOPY WITH PROPOFOL;  Surgeon: Rogene Houston, MD;  [facility];  Service: Endoscopy;  Laterality: N/A;  10:30  . ESOPHAGOGASTRODUODENOSCOPY N/A 02/25/2014   Procedure: ESOPHAGOGASTRODUODENOSCOPY (EGD);  Surgeon: Rogene Houston, MD;  [facility];  Service: Endoscopy;  Laterality: N/A;  730  . KNEE SURGERY Right   . PARTIAL KNEE ARTHROPLASTY Right 03/07/2018   Procedure: Right knee medial unicompartmental arthroplasty;  Surgeon: Gaynelle Arabian, MD;  Location: [facility];  Service: Orthopedics;  Laterality: Right;  with block  . TRANSTHORACIC ECHOCARDIOGRAM  08/4708   LV systolic function is low-normal; RV is normal in size & function; mild MR, mild TR  . TUBAL LIGATION      Family Psychiatric History: ***  Family History:  Family History  Problem Relation Age of Onset  . Other Mother  heart problems  . Other Brother        neck and back surgery  . Other Maternal Grandmother        brain tumor  . Leukemia Maternal Grandfather   . Dementia Father     Social History:   Social History   Socioeconomic History  . Marital status: Single    Spouse name: Not on file  . Number of children: 4  . Years of education: 9  . Highest education level: Not on file   Occupational History    Employer: [employer]  . Financial resource strain: Not on file  . Food insecurity:    Worry: Not on file    Inability: Not on file  . Transportation needs:    Medical: Not on file    Non-medical: Not on file  Tobacco Use  . Smoking status: Current Every Day Smoker    Packs/day: 0.50    Years: 28.00    Pack years: 14.00    Types: Cigarettes    Start date: 10/11/1986  . Smokeless tobacco: Never Used  Substance and Sexual Activity  . Alcohol use: Yes    Alcohol/week: 0.6 oz    Types: 1 Glasses of wine per week    Comment: occasionally   . Drug use: Yes    Types: Marijuana    Comment: twice a week  . Sexual activity: Not Currently    Birth control/protection: Surgical    Comment: tubal  Lifestyle  . Physical activity:    Days per week: Not on file    Minutes per session: Not on file  . Stress: Not on file  Relationships  . Social connections:    Talks on phone: Not on file    Gets together: Not on file    Attends religious service: Not on file    Active member of club or organization: Not on file    Attends meetings of clubs or organizations: Not on file    Relationship status: Not on file  Other Topics Concern  . Not on file  Social History Narrative  . Not on file    Additional Social History: ***  Allergies:   Allergies  Allergen Reactions  . Dicyclomine Palpitations    Patient states that her heart rate will go real fast when she takes this medication.  . Flagyl [Metronidazole Hcl] Nausea And Vomiting  . Metronidazole   . Morphine And Related Other (See Comments)    Headache   . Penicillins Other (See Comments)    Caused patient to pass out Has patient had a PCN reaction causing immediate rash, facial/tongue/throat swelling, SOB or lightheadedness with hypotension: unknown Has patient had a PCN reaction causing severe rash involving mucus membranes or skin necrosis: No Has patient had a PCN reaction that required  hospitalization No Has patient had a PCN reaction occurring within the last 10 years: No If all of the above answers are "NO", then may proceed with Cephalosporin use.      Metabolic Disorder Labs: Lab Results  Component Value Date   HGBA1C 5.4 08/08/2017   No results found for: PROLACTIN Lab Results  Component Value Date   CHOL 189 08/08/2017   TRIG 244 (H) 08/08/2017   HDL 40 08/08/2017   CHOLHDL 4.7 (H) 08/08/2017   LDLCALC 100 (H) 08/08/2017   LDLCALC 72 01/18/2016     Current Medications: Current Outpatient Medications  Medication Sig Dispense Refill  . albuterol (PROVENTIL HFA;VENTOLIN HFA) 108 (  90 Base) MCG/ACT inhaler Inhale 2 puffs into the lungs every 6 (six) hours as needed for wheezing or shortness of breath. 1 Inhaler 2  . aspirin EC 81 MG tablet Take 81 mg by mouth daily.    . butalbital-acetaminophen-caffeine (FIORICET, ESGIC) 50-325-40 MG tablet Take 1 tablet by mouth every 6 (six) hours as needed for headache. 30 tablet 1  . cetirizine (ZYRTEC) 10 MG tablet Take 10 mg by mouth daily.    . citalopram (CELEXA) 40 MG tablet Take 1 tablet (40 mg total) by mouth daily. 30 tablet 6  . fluticasone (FLONASE) 50 MCG/ACT nasal spray Place 2 sprays into both nostrils daily.    . hyoscyamine (LEVSIN SL) 0.125 MG SL tablet Place 1 tablet (0.125 mg total) under the tongue every 4 (four) hours as needed. 30 tablet 0  . LORazepam (ATIVAN) 0.5 MG tablet Take 1 bid prn anxiety 30 tablet 0  . methocarbamol (ROBAXIN) 500 MG tablet Take 1 tablet (500 mg total) by mouth every 6 (six) hours as needed for muscle spasms. 60 tablet 0  . ondansetron (ZOFRAN) 8 MG tablet TAKE 1 TABLET EVERY 8 HOURS AS NEEDED FOR NAUSEA OR VOMITING. 20 tablet 0  . oxyCODONE (OXY IR/ROXICODONE) 5 MG immediate release tablet Take 1-2 tablets (5-10 mg total) by mouth every 4 (four) hours as needed for moderate pain or severe pain. 60 tablet 0  . pantoprazole (PROTONIX) 20 MG tablet TAKE (1) TABLET TWICE A DAY  BEFORE MEALS. 60 tablet 3  . pseudoephedrine-acetaminophen (TYLENOL SINUS) 30-500 MG TABS tablet Take 1 tablet by mouth every 4 (four) hours as needed.     No current facility-administered medications for this visit.     Neurologic: Headache: No Seizure: No Paresthesias:No  Musculoskeletal: Strength & Muscle Tone: within normal limits Gait & Station: normal Patient leans: N/A  Psychiatric Specialty Exam: ROS  There were no vitals taken for this visit.There is no height or weight on file to calculate BMI.  General Appearance: Fairly Groomed  Eye Contact:  Good  Speech:  Clear and Coherent  Volume:  Normal  Mood:  {BHH MOOD:22306}  Affect:  {Affect (PAA):22687}  Thought Process:  Coherent  Orientation:  Full (Time, Place, and Person)  Thought Content:  Logical  Suicidal Thoughts:  {ST/HT (PAA):22692}  Homicidal Thoughts:  {ST/HT (PAA):22692}  Memory:  Immediate;   Good  Judgement:  {Judgement (PAA):22694}  Insight:  {Insight (PAA):22695}  Psychomotor Activity:  Normal  Concentration:  Concentration: Good and Attention Span: Good  Recall:  Good  Fund of Knowledge:Good  Language: Good  Akathisia:  No  Handed:  Right  AIMS (if indicated):  N/A  Assets:  Communication Skills Desire for Improvement  ADL's:  Intact  Cognition: WNL  Sleep:  ***   Assessment  Plan  The patient demonstrates the following risk factors for suicide: Chronic risk factors for suicide include: {Chronic Risk Factors for OIBBCWU:88916945}. Acute risk factors for suicide include: {Acute Risk Factors for WTUUEKC:00349179}. Protective factors for this patient include: {Protective Factors for Suicide XTAV:69794801}. Considering these factors, the overall suicide risk at this point appears to be {Desc; low/moderate/high:110033}. Patient {ACTION; IS/IS KPV:37482707} appropriate for outpatient follow up.   Treatment Plan Summary: Plan as above   Norman Clay, MD 7/5/20198:59 AM

## 2018-06-13 ENCOUNTER — Ambulatory Visit (HOSPITAL_COMMUNITY): Payer: Self-pay | Admitting: Psychiatry

## 2018-07-02 ENCOUNTER — Other Ambulatory Visit (INDEPENDENT_AMBULATORY_CARE_PROVIDER_SITE_OTHER): Payer: Self-pay | Admitting: Internal Medicine

## 2018-07-04 ENCOUNTER — Other Ambulatory Visit: Payer: Self-pay | Admitting: Adult Health

## 2018-07-09 ENCOUNTER — Telehealth: Payer: Self-pay | Admitting: Adult Health

## 2018-07-09 NOTE — Telephone Encounter (Signed)
Pt to call [facility] to see if they will see her, and I will refill celexa but not her ativan

## 2018-07-09 NOTE — Telephone Encounter (Signed)
Please call pt and talk to her about getting her meds refilled, Julie [name] had told her she could not get refilled here anymore  And referred her to mental health she said she called and went to see them and they would not treat her because they treated her daughter!!! Please advise

## 2018-07-24 ENCOUNTER — Other Ambulatory Visit: Payer: Self-pay | Admitting: Adult Health

## 2018-07-30 ENCOUNTER — Ambulatory Visit: Payer: Medicaid Other | Admitting: Adult Health

## 2018-08-08 ENCOUNTER — Other Ambulatory Visit: Payer: Self-pay | Admitting: Adult Health

## 2018-08-30 ENCOUNTER — Ambulatory Visit: Payer: Medicaid Other | Admitting: Adult Health

## 2018-09-04 ENCOUNTER — Other Ambulatory Visit: Payer: Self-pay | Admitting: Adult Health

## 2018-09-12 ENCOUNTER — Telehealth (INDEPENDENT_AMBULATORY_CARE_PROVIDER_SITE_OTHER): Payer: Self-pay | Admitting: Internal Medicine

## 2018-09-12 NOTE — Telephone Encounter (Signed)
Patient called stated she is having stomach issues (swelling) PCP is ordering a CT - please call at [phone]

## 2018-09-12 NOTE — Telephone Encounter (Signed)
She has seen her PCP. CT ordered for tomorrow

## 2018-10-02 ENCOUNTER — Ambulatory Visit (INDEPENDENT_AMBULATORY_CARE_PROVIDER_SITE_OTHER): Payer: Self-pay | Admitting: Internal Medicine

## 2018-10-10 ENCOUNTER — Encounter (INDEPENDENT_AMBULATORY_CARE_PROVIDER_SITE_OTHER): Payer: Self-pay | Admitting: Internal Medicine

## 2018-10-10 ENCOUNTER — Other Ambulatory Visit: Payer: Self-pay | Admitting: Adult Health

## 2018-10-10 ENCOUNTER — Ambulatory Visit (INDEPENDENT_AMBULATORY_CARE_PROVIDER_SITE_OTHER): Payer: Self-pay | Admitting: Internal Medicine

## 2018-10-10 VITALS — BP 90/62 | HR 80 | Temp 97.6°F | Ht 61.0 in | Wt 126.9 lb

## 2018-10-10 DIAGNOSIS — R1011 Right upper quadrant pain: Secondary | ICD-10-CM

## 2018-10-10 NOTE — Progress Notes (Signed)
Subjective:    Patient ID: Julie [name], female    DOB: [birth year], 47 y.o.   MRN: [id number]  HPI Here today with c/o RUQ pain. Pain off and on for about a month. She says the pain sometimes is steady. Sometimes it feels like a stabbing pain.  She has weight gain but she says her appetite is not good. She alternates between constipation and diarrhea.  She underwent a CT abdomen/pelvis with CM 09/14/2018 for LLQ pain x 2 weeks: No definite  Acute intra-abdominal or intrapelvic abnormalities.  She was covered with Cipro 500mg  BID. 09/06/2018 total bili 0.2, AlP 89, ALT 8 AST 15.4. Colonoscopy in 2017 was normal.    Review of Systems Past Medical History:  Diagnosis Date  . Anger   . Anxiety   . Aortic insufficiency 2006   Noted at heart cath - mild to moderate  . Arthritis   . Asthma   . Back pain   . BV (bacterial vaginosis) 05/28/2015  . Chronic diarrhea   . Chronic headache   . Depression   . Diarrhea 05/28/2015  . Dyslipidemia 01/19/2016  . GERD (gastroesophageal reflux disease)   . History of kidney stones    history of  . History of nuclear stress test 07/15/2008   lexiscan; mild-mod perfusion defect in mid anteroseptal, apical anterior, apical septal regions (attenuation artifiact), prominent gut uptake activity in infero-apical region; post-stress EF 64%; EKG negative for ischemia; patient experienced CP during test  . Hx of cardiac catheterization 2006   no significant CAD  . IBS (irritable bowel syndrome)   . Irritable bowel syndrome   . Nausea 05/28/2015  . Neck pain   . Numerous moles 01/18/2016  . RLQ abdominal pain 05/28/2015  . Seizures ([place])    2 years ago; unknown etiology and none since then and on no meds  . Vaginal discharge 05/28/2015  . Weight gain 01/18/2016    Past Surgical History:  Procedure Laterality Date  . BIOPSY N/A 02/25/2014   Procedure: BIOPSY;  Surgeon: Rogene Houston, MD;  [facility];  Service: Endoscopy;  Laterality: N/A;    . BREAST ENHANCEMENT SURGERY    . CARDIAC CATHETERIZATION  1025//2006   no significant CAD, mild-mod depressed LV systolic function, EF 11-94%, mild-mod AI (Dr. Gerrie Nordmann)   . CARPAL TUNNEL RELEASE Bilateral 01/01/2016  . CESAREAN SECTION    . COLONOSCOPY  05/27/2011   snare polypectomy   . COLONOSCOPY WITH PROPOFOL N/A 10/21/2016   Procedure: COLONOSCOPY WITH PROPOFOL;  Surgeon: Rogene Houston, MD;  [facility];  Service: Endoscopy;  Laterality: N/A;  10:30  . ESOPHAGOGASTRODUODENOSCOPY N/A 02/25/2014   Procedure: ESOPHAGOGASTRODUODENOSCOPY (EGD);  Surgeon: Rogene Houston, MD;  [facility];  Service: Endoscopy;  Laterality: N/A;  730  . KNEE SURGERY Right   . PARTIAL KNEE ARTHROPLASTY Right 03/07/2018   Procedure: Right knee medial unicompartmental arthroplasty;  Surgeon: Gaynelle Arabian, MD;  Location: [facility];  Service: Orthopedics;  Laterality: Right;  with block  . TRANSTHORACIC ECHOCARDIOGRAM  12/7406   LV systolic function is low-normal; RV is normal in size & function; mild MR, mild TR  . TUBAL LIGATION      Allergies  Allergen Reactions  . Dicyclomine Palpitations    Patient states that her heart rate will go real fast when she takes this medication.  . Flagyl [Metronidazole Hcl] Nausea And Vomiting  . Metronidazole   . Morphine And Related Other (See Comments)  Headache   . Penicillins Other (See Comments)    Caused patient to pass out Has patient had a PCN reaction causing immediate rash, facial/tongue/throat swelling, SOB or lightheadedness with hypotension: unknown Has patient had a PCN reaction causing severe rash involving mucus membranes or skin necrosis: No Has patient had a PCN reaction that required hospitalization No Has patient had a PCN reaction occurring within the last 10 years: No If all of the above answers are "NO", then may proceed with Cephalosporin use.      Current Outpatient Medications on File Prior to Visit  Medication  Sig Dispense Refill  . albuterol (PROVENTIL HFA;VENTOLIN HFA) 108 (90 Base) MCG/ACT inhaler Inhale 2 puffs into the lungs every 6 (six) hours as needed for wheezing or shortness of breath. 1 Inhaler 2  . aspirin EC 81 MG tablet Take 81 mg by mouth daily.    . butalbital-acetaminophen-caffeine (FIORICET, ESGIC) 50-325-40 MG tablet TAKE 1 TABLET BY MOUTH EVERY 6 HOURS AS NEEDED FOR HEADACHE. 30 tablet 0  . cetirizine (ZYRTEC) 10 MG tablet TAKE 1 TABLET ONCE DAILY. 30 tablet 6  . fluticasone (FLONASE) 50 MCG/ACT nasal spray Place 2 sprays into both nostrils daily.    . hyoscyamine (LEVSIN SL) 0.125 MG SL tablet Place 1 tablet (0.125 mg total) under the tongue every 4 (four) hours as needed. 30 tablet 0  . methocarbamol (ROBAXIN) 500 MG tablet Take 1 tablet (500 mg total) by mouth every 6 (six) hours as needed for muscle spasms. 60 tablet 0  . ondansetron (ZOFRAN) 8 MG tablet TAKE 1 TABLET EVERY 8 HOURS AS NEEDED FOR NAUSEA OR VOMITING. 20 tablet 0  . pantoprazole (PROTONIX) 20 MG tablet TAKE (1) TABLET TWICE A DAY BEFORE MEALS. 60 tablet 4   No current facility-administered medications on file prior to visit.         Objective:   Physical Exam Blood pressure 90/62, pulse 80, temperature 97.6 F (36.4 C), height 5\' 1"  (1.549 m), weight 126 lb 14.4 oz (57.6 kg). Alert and oriented. Skin warm and dry. Oral mucosa is moist.   . Sclera anicteric, conjunctivae is pink. Thyroid not enlarged. No cervical lymphadenopathy. Lungs clear. Heart regular rate and rhythm.  Abdomen is soft. Bowel sounds are positive. No hepatomegaly. No abdominal masses felt. Tenderness RUQ.  No edema to lower extremities.           Assessment & Plan:  RUQ pain. Am going to get an [place] RUQ and an Hepatic. Further recommendations to follow.

## 2018-10-10 NOTE — Patient Instructions (Signed)
Labs and US. 

## 2018-10-11 ENCOUNTER — Ambulatory Visit (INDEPENDENT_AMBULATORY_CARE_PROVIDER_SITE_OTHER): Payer: Medicaid Other | Admitting: Adult Health

## 2018-10-11 ENCOUNTER — Encounter: Payer: Self-pay | Admitting: Adult Health

## 2018-10-11 VITALS — BP 112/69 | HR 85 | Ht 61.0 in | Wt 130.0 lb

## 2018-10-11 DIAGNOSIS — R102 Pelvic and perineal pain: Secondary | ICD-10-CM | POA: Diagnosis not present

## 2018-10-11 DIAGNOSIS — N921 Excessive and frequent menstruation with irregular cycle: Secondary | ICD-10-CM

## 2018-10-11 MED ORDER — KETOROLAC TROMETHAMINE 10 MG PO TABS: 10.0000 mg | ORAL_TABLET | Freq: Four times a day (QID) | ORAL | 0 refills | Status: DC | PRN

## 2018-10-11 MED ORDER — ONDANSETRON HCL 8 MG PO TABS: ORAL_TABLET | ORAL | 1 refills | Status: DC

## 2018-10-11 NOTE — Progress Notes (Signed)
  Subjective:     Patient ID: Julie [name], female   DOB: 09-07-1971, 47 y.o.   MRN: [id number]  HPI Julie [name] is a 47 year old white female in complaining of heavy bleeding and pelvic pain.   Review of Systems +spotting then for last 2-3 days heavy bleeding, changes pads every 3 hours  +pelvic pain No new partners Saw Writer yesterday for RUQ pain Reviewed past medical,surgical, social and family history. Reviewed medications and allergies.      Objective:   Physical Exam BP 112/69 (BP Location: Left Arm, Patient Position: Sitting, Cuff Size: Normal)   Pulse 85   Ht '5\' 1"'$  (1.549 m)   Wt 130 lb (59 kg)   LMP 10/08/2018 (Approximate)   BMI 24.56 kg/m   Skin warm and dry.Pelvic: external genitalia is normal in appearance no lesions, vagina: +period blood,urethra has no lesions or masses noted, cervix:smooth and bulbous, no CMT, uterus: normal size, shape and contour, + tender, no masses felt, adnexa: no masses, + tenderness noted, L>R. Bladder is non tender and no masses felt.  GC/CHL obtained.  Will get labs and [place] scheduled to assess uterus and ovaries. Examination chaperoned by Estill Bamberg Rash LPN.    Assessment:     1. Menorrhagia with irregular cycle   2. Pelvic pain       Plan:    GC/CHL sent  Check CBC and ESR Return in 1 week for GYN [place] Meds ordered this encounter  Medications  . ketorolac (TORADOL) 10 MG tablet    Sig: Take 1 tablet (10 mg total) by mouth every 6 (six) hours as needed.    Dispense:  20 tablet    Refill:  0    Order Specific Question:   Supervising Provider    Answer:   Elonda Husky, LUTHER H [[id number]]  . ondansetron (ZOFRAN) 8 MG tablet    Sig: TAKE 1 TABLET EVERY 8 HOURS AS NEEDED FOR NAUSEA OR VOMITING.    Dispense:  20 tablet    Refill:  1    Order Specific Question:   Supervising Provider    Answer:   Tania Ade H [[id number]]

## 2018-10-12 LAB — CBC
Hematocrit: 32.3 % — ABNORMAL LOW (ref 34.0–46.6)
Hemoglobin: 10.2 g/dL — ABNORMAL LOW (ref 11.1–15.9)
MCH: 28.3 pg (ref 26.6–33.0)
MCHC: 31.6 g/dL (ref 31.5–35.7)
MCV: 90 fL (ref 79–97)
Platelets: 411 10*3/uL (ref 150–450)
RBC: 3.61 x10E6/uL — ABNORMAL LOW (ref 3.77–5.28)
RDW: 16.7 % — AB (ref 12.3–15.4)
WBC: 12.3 10*3/uL — AB (ref 3.4–10.8)

## 2018-10-12 LAB — SEDIMENTATION RATE: Sed Rate: 21 mm/hr (ref 0–32)

## 2018-10-13 LAB — GC/CHLAMYDIA PROBE AMP
Chlamydia trachomatis, NAA: NEGATIVE
Neisseria gonorrhoeae by PCR: NEGATIVE

## 2018-10-15 ENCOUNTER — Telehealth: Payer: Self-pay | Admitting: Adult Health

## 2018-10-15 NOTE — Telephone Encounter (Signed)
Left message @ 2:16 pm. JSY

## 2018-10-15 NOTE — Telephone Encounter (Signed)
Patient called, wants to check on her lab results.  She also stated that the Toradol is not helping.  [place]  [phone]

## 2018-10-15 NOTE — Telephone Encounter (Signed)
Spoke with pt. Lab results given along with JAG's recommendations. Pt states Toradol is not helping. Pt was prescribed Toradol for pain in left side. Scheduled for [place] 11/14. Please advise. Thanks!! [place]

## 2018-10-16 ENCOUNTER — Telehealth: Payer: Self-pay | Admitting: Obstetrics & Gynecology

## 2018-10-16 MED ORDER — TRAMADOL HCL 50 MG PO TABS: 50.0000 mg | ORAL_TABLET | Freq: Four times a day (QID) | ORAL | 0 refills | Status: DC | PRN

## 2018-10-16 NOTE — Telephone Encounter (Signed)
Tramadol e prescribed

## 2018-10-16 NOTE — Telephone Encounter (Signed)
Meds ordered this encounter  Medications  . traMADol (ULTRAM) 50 MG tablet    Sig: Take 1 tablet (50 mg total) by mouth every 6 (six) hours as needed.    Dispense:  20 tablet    Refill:  0

## 2018-10-17 ENCOUNTER — Ambulatory Visit (HOSPITAL_COMMUNITY): Payer: Medicaid Other

## 2018-10-18 ENCOUNTER — Ambulatory Visit: Payer: Medicaid Other

## 2018-10-18 DIAGNOSIS — R102 Pelvic and perineal pain: Secondary | ICD-10-CM | POA: Diagnosis not present

## 2018-10-18 DIAGNOSIS — N921 Excessive and frequent menstruation with irregular cycle: Secondary | ICD-10-CM | POA: Diagnosis not present

## 2018-10-18 NOTE — Progress Notes (Signed)
PELVIC US TA/TV:heterogeneous anteverted uterus with a posterior submucosal fibroid 1.9 x 1.4 x 1.9 cm,EEC 4.7 mm,normal left ovary,simple right dominate follicle 2.1 x 1.9 x 1.7 cm,ovaries appear mobile,no free fluid,some adnexal discomfort during ultrasound

## 2018-10-24 ENCOUNTER — Ambulatory Visit (HOSPITAL_COMMUNITY): Payer: Medicaid Other

## 2018-10-24 ENCOUNTER — Encounter: Payer: Self-pay | Admitting: Adult Health

## 2018-10-24 ENCOUNTER — Telehealth: Payer: Self-pay | Admitting: Adult Health

## 2018-10-24 DIAGNOSIS — D250 Submucous leiomyoma of uterus: Secondary | ICD-10-CM

## 2018-10-24 HISTORY — DX: Submucous leiomyoma of uterus: D25.0

## 2018-10-24 NOTE — Telephone Encounter (Signed)
Left message that US showed small submucosal fibroid<2 cm and normal left ovary and right ovary has dominate follicle.

## 2019-01-11 ENCOUNTER — Other Ambulatory Visit: Payer: Self-pay | Admitting: Adult Health

## 2019-01-28 ENCOUNTER — Ambulatory Visit: Payer: Medicaid Other | Admitting: Adult Health

## 2019-02-07 ENCOUNTER — Encounter: Payer: Self-pay | Admitting: Adult Health

## 2019-02-07 ENCOUNTER — Ambulatory Visit (INDEPENDENT_AMBULATORY_CARE_PROVIDER_SITE_OTHER): Payer: Medicaid Other | Admitting: Adult Health

## 2019-02-07 VITALS — BP 118/76 | HR 92 | Ht 60.0 in | Wt 127.0 lb

## 2019-02-07 DIAGNOSIS — R454 Irritability and anger: Secondary | ICD-10-CM | POA: Diagnosis not present

## 2019-02-07 DIAGNOSIS — Z780 Asymptomatic menopausal state: Secondary | ICD-10-CM | POA: Diagnosis not present

## 2019-02-07 DIAGNOSIS — R232 Flushing: Secondary | ICD-10-CM

## 2019-02-07 MED ORDER — LORAZEPAM 0.5 MG PO TABS: 0.5000 mg | ORAL_TABLET | Freq: Three times a day (TID) | ORAL | 0 refills | Status: DC | PRN

## 2019-02-07 MED ORDER — PROGESTERONE MICRONIZED 200 MG PO CAPS: ORAL_CAPSULE | ORAL | 3 refills | Status: DC

## 2019-02-07 MED ORDER — ESTRADIOL 1 MG PO TABS: 1.0000 mg | ORAL_TABLET | Freq: Every day | ORAL | 3 refills | Status: DC

## 2019-02-07 NOTE — Progress Notes (Signed)
Patient ID: Julie [name], female   DOB: Oct 30, 1971, 48 y.o.   MRN: [id number] History of Present Illness:  Lynel is a 48 year old white female, got engaged 01/18/19, in complaining of hot flashes and being irritable and no period in months. PCP is [facility].   Current Medications, Allergies, Past Medical History, Past Surgical History, Family History and Social History were reviewed in [employer] record.     Review of Systems: +hot flashes  irregular periods Very irritable Decreased energy Not sleeping well Headaches      Physical Exam:BP 118/76 (BP Location: Left Arm, Patient Position: Sitting, Cuff Size: Normal)   Pulse 92   Ht 5' (1.524 m)   Wt 127 lb (57.6 kg)   LMP 09/18/2018 (Approximate)   BMI 24.80 kg/m  General:  Well developed, well nourished, no acute distress Skin:  Warm and dry Lungs; Clear to auscultation bilaterally Cardiovascular: Regular rate and rhythm Psych:  No mood changes, alert and cooperative,seems happy Fall risk is low. Discussed HRT, she denies any MI,stroke, DVT or breast cancer and she wants to try something. Will rx estrace and Prometrium, and ativan till HRT kicks in, but do not take ativan if taking pain meds, and she says she won't.   Impression:  1. Hot flashes   2. Irritable   3. Menopause      Plan:  Meds ordered this encounter  Medications  . estradiol (ESTRACE) 1 MG tablet    Sig: Take 1 tablet (1 mg total) by mouth daily.    Dispense:  30 tablet    Refill:  3    Order Specific Question:   Supervising Provider    Answer:   Elonda Husky, LUTHER H [[id number]]  . progesterone (PROMETRIUM) 200 MG capsule    Sig: Take 1 daily at bedtime    Dispense:  30 capsule    Refill:  3    Order Specific Question:   Supervising Provider    Answer:   Elonda Husky, LUTHER H [[id number]]  . LORazepam (ATIVAN) 0.5 MG tablet    Sig: Take 1 tablet (0.5 mg total) by mouth every 8 (eight) hours as needed for anxiety.    Dispense:   30 tablet    Refill:  0    Order Specific Question:   Supervising Provider    Answer:   Tania Ade H [[id number]]  F/U in 2 months Review handouts on menopause and HRT  Will refer to [facility] health too.

## 2019-02-07 NOTE — Patient Instructions (Signed)
Menopause and Hormone Replacement Therapy  What is hormone replacement therapy?    Hormone replacement therapy (HRT) is the use of artificial (synthetic) hormones to replace hormones that your body stops producing during menopause.  Menopause is the normal time of life when menstrual periods stop completely and the ovaries stop producing the female hormones estrogen and progesterone. This lack of hormones can affect your health and cause undesirable symptoms. HRT can relieve some of those symptoms.  What are my options for HRT?  HRT may consist of the synthetic hormones estrogen and progestin, or it may consist of only estrogen (estrogen-only therapy). You and your health care provider will decide which form of HRT is best for you.  If you choose to be on HRT and you have a uterus, estrogen and progestin are usually prescribed. Estrogen-only therapy is used for women who do not have a uterus.  Possible options for taking HRT include:   Pills.   Patches.   Gels.   Sprays.   Vaginal cream.   Vaginal rings.   Vaginal inserts.  The amount of hormone(s) that you take and how long you take the hormone(s) varies depending on your individual health. It is important to:   Begin HRT with the lowest possible dosage.   Stop HRT as soon as your health care provider tells you to stop.   Work with your health care provider so that you feel informed and comfortable with your decisions.  What are the benefits of HRT?  HRT can reduce the frequency and severity of menopausal symptoms. Benefits of HRT vary depending on the menopausal symptoms that you have, the severity of your symptoms, and your overall health.  HRT may help to improve the following menopausal symptoms:   Hot flashes and night sweats. These are sudden feelings of heat that spread over the face and body. The skin may turn red, like a blush. Night sweats are hot flashes that happen while you are sleeping or trying to sleep.   Bone loss (osteoporosis). The  body loses calcium more quickly after menopause, causing the bones to become weaker. This can increase the risk for bone breaks (fractures).   Vaginal dryness. The lining of the vagina can become thin and dry, which can cause pain during sexual intercourse or cause infection, burning, or itching.   Urinary tract infections.   Urinary incontinence. This is a decreased ability to control when you urinate.   Irritability.   Short-term memory problems.  What are the risks of HRT?  Risks of HRT vary depending on your individual health and medical history. Risks of HRT also depend on whether you receive both estrogen and progestin or you receive estrogen only.HRT may increase the risk of:   Spotting. This is when a small amount of bloodleaks from the vagina unexpectedly.   Endometrial cancer. This cancer is in the lining of the uterus (endometrium).   Breast cancer.   Increased density of breast tissue. This can make it harder to find breast cancer on a breast X-ray (mammogram).   Stroke.   Heart attack.   Blood clots.   Gallbladder disease.  Risks of HRT can increase if you have any of the following conditions:   Endometrial cancer.   Liver disease.   Heart disease.   Breast cancer.   History of blood clots.   History of stroke.  How should I care for myself while I am on HRT?   Take over-the-counter and prescription medicines only as told   by your health care provider.   Get mammograms, pelvic exams, and medical checkups as often as told by your health care provider.   Have Pap tests done as often as told by your health care provider. A Pap test is sometimes called a Pap smear. It is a screening test that is used to check for signs of cancer of the cervix and vagina. A Pap test can also identify the presence of infection or precancerous changes. Pap tests may be done:  ? Every 3 years, starting at age 21.  ? Every 5 years, starting after age 30, in combination with testing for human papillomavirus  (HPV).  ? More often or less often depending on other medical conditions you have, your age, and other risk factors.   It is your responsibility to get your Pap test results. Ask your health care provider or the department performing the test when your results will be ready.   Keep all follow-up visits as told by your health care provider. This is important.  When should I seek medical care?  Talk with your health care provider if:   You have any of these:  ? Pain or swelling in your legs.  ? Shortness of breath.  ? Chest pain.  ? Lumps or changes in your breasts or armpits.  ? Slurred speech.  ? Pain, burning, or bleeding when you urine.   You develop any of these:  ? Unusual vaginal bleeding.  ? Dizziness or headaches.  ? Weakness or numbness in any part of your arms or legs.  ? Pain in your abdomen.  This information is not intended to replace advice given to you by your health care provider. Make sure you discuss any questions you have with your health care provider.  Document Released: 08/20/2003 Document Revised: 07/06/2017 Document Reviewed: 05/25/2015  [employer] Interactive Patient Education  2019 [employer]      Menopause  Menopause is the normal time of life when menstrual periods stop completely. It is usually confirmed by 12 months without a menstrual period. The transition to menopause (perimenopause) most often happens between the ages of 45 and 55. During perimenopause, hormone levels change in your body, which can cause symptoms and affect your health. Menopause may increase your risk for:   Loss of bone (osteoporosis), which causes bone breaks (fractures).   Depression.   Hardening and narrowing of the arteries (atherosclerosis), which can cause heart attacks and strokes.  What are the causes?  This condition is usually caused by a natural change in hormone levels that happens as you get older. The condition may also be caused by surgery to remove both ovaries (bilateral  oophorectomy).  What increases the risk?  This condition is more likely to start at an earlier age if you have certain medical conditions or treatments, including:   A tumor of the pituitary gland in the brain.   A disease that affects the ovaries and hormone production.   Radiation treatment for cancer.   Certain cancer treatments, such as chemotherapy or hormone (anti-estrogen) therapy.   Heavy smoking and excessive alcohol use.   Family history of early menopause.  This condition is also more likely to develop earlier in women who are very thin.  What are the signs or symptoms?  Symptoms of this condition include:   Hot flashes.   Irregular menstrual periods.   Night sweats.   Changes in feelings about sex. This could be a decrease in sex drive or an increased   comfort around your sexuality.   Vaginal dryness and thinning of the vaginal walls. This may cause painful intercourse.   Dryness of the skin and development of wrinkles.   Headaches.   Problems sleeping (insomnia).   Mood swings or irritability.   Memory problems.   Weight gain.   Hair growth on the face and chest.   Bladder infections or problems with urinating.  How is this diagnosed?  This condition is diagnosed based on your medical history, a physical exam, your age, your menstrual history, and your symptoms. Hormone tests may also be done.  How is this treated?  In some cases, no treatment is needed. You and your health care provider should make a decision together about whether treatment is necessary. Treatment will be based on your individual condition and preferences. Treatment for this condition focuses on managing symptoms. Treatment may include:   Menopausal hormone therapy (MHT).   Medicines to treat specific symptoms or complications.   Acupuncture.   Vitamin or herbal supplements.  Before starting treatment, make sure to let your health care provider know if you have a personal or family history of:   Heart  disease.   Breast cancer.   Blood clots.   Diabetes.   Osteoporosis.  Follow these instructions at home:  Lifestyle   Do not use any products that contain nicotine or tobacco, such as cigarettes and e-cigarettes. If you need help quitting, ask your health care provider.   Get at least 30 minutes of physical activity on 5 or more days each week.   Avoid alcoholic and caffeinated beverages, as well as spicy foods. This may help prevent hot flashes.   Get 7-8 hours of sleep each night.   If you have hot flashes, try:  ? Dressing in layers.  ? Avoiding things that may trigger hot flashes, such as spicy food, warm places, or stress.  ? Taking slow, deep breaths when a hot flash starts.  ? Keeping a fan in your home and office.   Find ways to manage stress, such as deep breathing, meditation, or journaling.   Consider going to group therapy with other women who are having menopause symptoms. Ask your health care provider about recommended group therapy meetings.  Eating and drinking   Eat a healthy, balanced diet that contains whole grains, lean protein, low-fat dairy, and plenty of fruits and vegetables.   Your health care provider may recommend adding more soy to your diet. Foods that contain soy include tofu, tempeh, and soy milk.   Eat plenty of foods that contain calcium and vitamin D for bone health. Items that are rich in calcium include low-fat milk, yogurt, beans, almonds, sardines, broccoli, and kale.  Medicines   Take over-the-counter and prescription medicines only as told by your health care provider.   Talk with your health care provider before starting any herbal supplements. If prescribed, take vitamins and supplements as told by your health care provider. These may include:  ? Calcium. Women age 51 and older should get 1,200 mg (milligrams) of calcium every day.  ? Vitamin D. Women need 600-800 International Units of vitamin D each day.  ? Vitamins B12 and B6. Aim for 50 micrograms of B12  and 1.5 mg of B6 each day.  General instructions   Keep track of your menstrual periods, including:  ? When they occur.  ? How heavy they are and how long they last.  ? How much time passes between periods.   Keep   track of your symptoms, noting when they start, how often you have them, and how long they last.   Use vaginal lubricants or moisturizers to help with vaginal dryness and improve comfort during sex.   Keep all follow-up visits as told by your health care provider. This is important. This includes any group therapy or counseling.  Contact a health care provider if:   You are still having menstrual periods after age 55.   You have pain during sex.   You have not had a period for 12 months and you develop vaginal bleeding.  Get help right away if:   You have:  ? Severe depression.  ? Excessive vaginal bleeding.  ? Pain when you urinate.  ? A fast or irregular heart beat (palpitations).  ? Severe headaches.  ? Abdomen (abdominal) pain or severe indigestion.   You fell and you think you have a broken bone.   You develop leg or chest pain.   You develop vision problems.   You feel a lump in your breast.  Summary   Menopause is the normal time of life when menstrual periods stop completely. It is usually confirmed by 12 months without a menstrual period.   The transition to menopause (perimenopause) most often happens between the ages of 45 and 55.   Symptoms can be managed through medicines, lifestyle changes, and complementary therapies such as acupuncture.   Eat a balanced diet that is rich in nutrients to promote bone health and heart health and to manage symptoms during menopause.  This information is not intended to replace advice given to you by your health care provider. Make sure you discuss any questions you have with your health care provider.  Document Released: 02/11/2004 Document Revised: 12/24/2016 Document Reviewed: 12/24/2016  [employer] Interactive Patient Education  2019 [employer]

## 2019-02-08 ENCOUNTER — Other Ambulatory Visit: Payer: Self-pay | Admitting: Adult Health

## 2019-02-08 DIAGNOSIS — F419 Anxiety disorder, unspecified: Secondary | ICD-10-CM

## 2019-02-08 DIAGNOSIS — F329 Major depressive disorder, single episode, unspecified: Secondary | ICD-10-CM

## 2019-02-08 NOTE — Progress Notes (Signed)
Referred to [facility]

## 2019-02-22 ENCOUNTER — Other Ambulatory Visit: Payer: Self-pay | Admitting: Adult Health
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
## 2019-03-06 ENCOUNTER — Telehealth: Payer: Self-pay | Admitting: *Deleted

## 2019-03-06 NOTE — Telephone Encounter (Signed)
Patient called stating Julie [name] is writing her nerve medicine until she see's her behavorial health doctor next week. Patient states she knows it is soon to get this refilled but with her kids and all of this going on about this virus she thinks she needs more. Please call patient [phone]

## 2019-03-07 ENCOUNTER — Telehealth: Payer: Self-pay | Admitting: Adult Health

## 2019-03-07 MED ORDER — LORAZEPAM 1 MG PO TABS: ORAL_TABLET | ORAL | 1 refills | Status: DC

## 2019-03-07 NOTE — Telephone Encounter (Signed)
Has swelling with estrace so stop it. But continue Prometrium, and she is feeling super stressed, has appt next week. Will refill ativan

## 2019-03-07 NOTE — Telephone Encounter (Signed)
Left message I called and ativan was refilled 3/20 and keep [place] appt

## 2019-03-07 NOTE — Telephone Encounter (Signed)
Patient called stating that she would like a call back from [place]. Pt stating that she needs to speak with her regarding another medication.

## 2019-03-07 NOTE — Addendum Note (Signed)
Addended by: Derrek Monaco A on: 03/07/2019 04:43 PM   Modules accepted: Orders

## 2019-03-07 NOTE — Telephone Encounter (Signed)
Patient has other medication questions, she is requesting to speak to [facility] directly.  [phone]

## 2019-03-12 ENCOUNTER — Ambulatory Visit (INDEPENDENT_AMBULATORY_CARE_PROVIDER_SITE_OTHER): Payer: Medicaid Other | Admitting: Psychiatry

## 2019-03-12 ENCOUNTER — Other Ambulatory Visit: Payer: Self-pay

## 2019-03-12 ENCOUNTER — Encounter (HOSPITAL_COMMUNITY): Payer: Self-pay | Admitting: Psychiatry

## 2019-03-12 DIAGNOSIS — F331 Major depressive disorder, recurrent, moderate: Secondary | ICD-10-CM

## 2019-03-12 DIAGNOSIS — F4310 Post-traumatic stress disorder, unspecified: Secondary | ICD-10-CM | POA: Diagnosis not present

## 2019-03-12 MED ORDER — DULOXETINE HCL 60 MG PO CPEP: 60.0000 mg | ORAL_CAPSULE | Freq: Every day | ORAL | 0 refills | Status: DC

## 2019-03-12 MED ORDER — TRAZODONE HCL 50 MG PO TABS: ORAL_TABLET | ORAL | 0 refills | Status: DC

## 2019-03-12 MED ORDER — LORAZEPAM 1 MG PO TABS: 1.0000 mg | ORAL_TABLET | Freq: Two times a day (BID) | ORAL | 0 refills | Status: DC | PRN

## 2019-03-12 MED ORDER — DULOXETINE HCL 30 MG PO CPEP: 30.0000 mg | ORAL_CAPSULE | Freq: Every day | ORAL | 0 refills | Status: DC

## 2019-03-12 NOTE — Patient Instructions (Signed)
1. Start duloxetine 30 mg daily for one week, then 60 mg daily  2. Continue lorazepam 1 mg twice a day as needed for anxiety  3. Start Trazodone 25-50 mg at night as needed for sleep 4. Referral to therapy 5. Next appointment: 5/4 at 3 PM for 30 mins, video visit

## 2019-03-18 ENCOUNTER — Other Ambulatory Visit: Payer: Self-pay | Admitting: Adult Health

## 2019-03-18 MED ORDER — CETIRIZINE HCL 10 MG PO TABS: 10.0000 mg | ORAL_TABLET | Freq: Every day | ORAL | 6 refills | Status: DC

## 2019-03-18 NOTE — Progress Notes (Signed)
Refill zyrtec 

## 2019-04-03 ENCOUNTER — Telehealth (HOSPITAL_COMMUNITY): Payer: Self-pay | Admitting: *Deleted

## 2019-04-03 NOTE — Telephone Encounter (Signed)
Dr Modesta Messing Patient called saying she needs to talk to you . " I called because I need to talk to her" She says she has migraines headache's & she's been having a headache every evening.  She asked if I was the one to prescribed medication & the said I need to talk to Dr Modesta Messing
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
## 2019-04-04 NOTE — Telephone Encounter (Signed)
Left voice message. Would advise her to discontinue duloxetine if her headache worsens after starting the medication. She has an appointment on May 4th: we can discuss other medication option at her next visit/when her symptoms subsides. Please tell her above when she calls back to [place].

## 2019-04-08 ENCOUNTER — Ambulatory Visit (INDEPENDENT_AMBULATORY_CARE_PROVIDER_SITE_OTHER): Payer: Medicaid Other | Admitting: Psychiatry

## 2019-04-08 ENCOUNTER — Encounter (HOSPITAL_COMMUNITY): Payer: Self-pay | Admitting: Psychiatry

## 2019-04-08 ENCOUNTER — Other Ambulatory Visit: Payer: Self-pay

## 2019-04-08 DIAGNOSIS — F321 Major depressive disorder, single episode, moderate: Secondary | ICD-10-CM

## 2019-04-08 MED ORDER — QUETIAPINE FUMARATE 25 MG PO TABS: 25.0000 mg | ORAL_TABLET | Freq: Every day | ORAL | 0 refills | Status: DC

## 2019-04-08 MED ORDER — DULOXETINE HCL 60 MG PO CPEP: 60.0000 mg | ORAL_CAPSULE | Freq: Every day | ORAL | 0 refills | Status: DC

## 2019-04-08 MED ORDER — TRAZODONE HCL 50 MG PO TABS: ORAL_TABLET | ORAL | 0 refills | Status: DC

## 2019-04-08 MED ORDER — LORAZEPAM 1 MG PO TABS: 1.0000 mg | ORAL_TABLET | Freq: Two times a day (BID) | ORAL | 0 refills | Status: DC | PRN

## 2019-04-08 NOTE — Patient Instructions (Signed)
1. Continue duloxetine 60 mg daily  2. Start quetiapine 25 mg at night  3. Continue lorazepam 1 mg twice a day as needed for anxiety  4. Continue Trazodone 25-50 mg at night as needed for sleep 5. Next appointment 6/1 at 2:20 for 30 mins

## 2019-04-09 ENCOUNTER — Ambulatory Visit: Payer: Self-pay | Admitting: Adult Health

## 2019-04-11 ENCOUNTER — Encounter: Payer: Self-pay | Admitting: *Deleted

## 2019-04-15 ENCOUNTER — Ambulatory Visit (HOSPITAL_COMMUNITY): Payer: Medicaid Other | Admitting: Licensed Clinical Social Worker

## 2019-04-15 ENCOUNTER — Other Ambulatory Visit: Payer: Self-pay
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
## 2019-05-02 ENCOUNTER — Ambulatory Visit (HOSPITAL_COMMUNITY): Payer: Medicaid Other | Admitting: Licensed Clinical Social Worker

## 2019-05-06 ENCOUNTER — Other Ambulatory Visit: Payer: Self-pay

## 2019-05-06 ENCOUNTER — Encounter (HOSPITAL_COMMUNITY): Payer: Self-pay | Admitting: Psychiatry

## 2019-05-06 ENCOUNTER — Ambulatory Visit (INDEPENDENT_AMBULATORY_CARE_PROVIDER_SITE_OTHER): Payer: Medicaid Other | Admitting: Psychiatry

## 2019-05-06 DIAGNOSIS — F321 Major depressive disorder, single episode, moderate: Secondary | ICD-10-CM

## 2019-05-06 DIAGNOSIS — F4310 Post-traumatic stress disorder, unspecified: Secondary | ICD-10-CM | POA: Diagnosis not present

## 2019-05-06 MED ORDER — QUETIAPINE FUMARATE 25 MG PO TABS: 25.0000 mg | ORAL_TABLET | Freq: Every day | ORAL | 0 refills | Status: DC

## 2019-05-06 MED ORDER — LORAZEPAM 1 MG PO TABS: 1.0000 mg | ORAL_TABLET | Freq: Two times a day (BID) | ORAL | 1 refills | Status: DC | PRN

## 2019-05-06 MED ORDER — TRAZODONE HCL 50 MG PO TABS: ORAL_TABLET | ORAL | 0 refills | Status: DC

## 2019-05-06 MED ORDER — DULOXETINE HCL 60 MG PO CPEP: 60.0000 mg | ORAL_CAPSULE | Freq: Every day | ORAL | 0 refills | Status: DC

## 2019-05-06 NOTE — Patient Instructions (Addendum)
1.Continue duloxetine60 mg daily  2. Continue quetiapine 25 mg at night  3. Continue lorazepam 1 mg twice a day as needed for anxiety 4.ContinueTrazodone 25-50 mg at night as needed for sleep 5. Next appointment 7/27 at 1:40 -Check TSH at [place]

## 2019-05-08 ENCOUNTER — Ambulatory Visit (INDEPENDENT_AMBULATORY_CARE_PROVIDER_SITE_OTHER): Payer: Medicaid Other | Admitting: Licensed Clinical Social Worker

## 2019-05-08 ENCOUNTER — Other Ambulatory Visit: Payer: Self-pay

## 2019-05-08 DIAGNOSIS — F4310 Post-traumatic stress disorder, unspecified: Secondary | ICD-10-CM

## 2019-05-08 DIAGNOSIS — F321 Major depressive disorder, single episode, moderate: Secondary | ICD-10-CM | POA: Diagnosis not present

## 2019-05-09 ENCOUNTER — Encounter (HOSPITAL_COMMUNITY): Payer: Self-pay | Admitting: Licensed Clinical Social Worker
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
## 2019-05-28 LAB — PROTIME-INR
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
## 2019-05-29 ENCOUNTER — Encounter (INDEPENDENT_AMBULATORY_CARE_PROVIDER_SITE_OTHER): Payer: Self-pay | Admitting: Internal Medicine

## 2019-05-29 ENCOUNTER — Ambulatory Visit (INDEPENDENT_AMBULATORY_CARE_PROVIDER_SITE_OTHER): Payer: Medicaid Other | Admitting: Internal Medicine

## 2019-05-29 ENCOUNTER — Other Ambulatory Visit: Payer: Self-pay

## 2019-05-29 VITALS — BP 127/74 | HR 101 | Temp 98.1°F | Ht 60.0 in | Wt 124.9 lb

## 2019-05-29 DIAGNOSIS — R935 Abnormal findings on diagnostic imaging of other abdominal regions, including retroperitoneum: Secondary | ICD-10-CM

## 2019-05-29 DIAGNOSIS — R1030 Lower abdominal pain, unspecified: Secondary | ICD-10-CM | POA: Diagnosis not present

## 2019-05-29 DIAGNOSIS — K5732 Diverticulitis of large intestine without perforation or abscess without bleeding: Secondary | ICD-10-CM | POA: Diagnosis not present

## 2019-05-29 NOTE — Patient Instructions (Signed)
Will get records from [facility]

## 2019-05-29 NOTE — Progress Notes (Signed)
Subjective:    Patient ID: Julie [name], female    DOB: 1971/05/03, 48 y.o.   MRN: [id number]  HPI Here today f/u. Evaluated by Arsenio Katz 05/24/19 and CT scan for abdominal pain. Underwent a CT scan which revealed: Equivocal wall thickening of the sigmoid colon, questionable for mild colitis or diverticulitis although not surrounding inflammatory change in the pericolonic fat. Focally thickened appearance of the pylorus, appears different compared to prior, consider correlation with endoscopy to exclude PUD or mass.  She is presently taking Cipro 500mg  BID. She tells me she was admitted to [name] yesterday but she left AMA. She left because they would not give her anything stronger than Toradol.  She tells me she is having abdominal pain  Left mid and rt abdomen. She has had pain for about a week. There has been no fever. Today she does not feel good. It feels like her stomach is going to pop. She is on a clear liquid diet.  Has not had a BM in 4 days.  Takes ASA 81mg  daily.  Rarely takes Meloxicam  3/24/2015EGD  Indications: Patient is a 48 year old Caucasian female who has chronic diarrhea felt to be secondary to IBS unresponsible therapy. She was screened for celiac disease. Tissue trueness glutaminase antibody IgA and gliadin antibody IgG are both elevated. She is therefore undergoing EGD with duodenal biopsy. She also has chronic GERD and maintained on PPI. Impression: Small sliding-type hernia without evidence of esophagitis. Small amount of food debris in the stomach with patent pylorus and no evidence of peptic ulcer disease. No endoscopic changes of celiac disease noted involving bulbar and post bulbar mucosa. Biopsy taken.    Review of Systems Past Medical History:  Diagnosis Date  . Anger   . Anxiety   . Aortic insufficiency 2006   Noted  at heart cath - mild to moderate  . Arthritis   . Asthma   . Back pain   . BV (bacterial vaginosis) 05/28/2015  . Chronic diarrhea   . Chronic headache   . Depression   . Diarrhea 05/28/2015  . Dyslipidemia 01/19/2016  . Fibroids, submucosal 10/24/2018  . GERD (gastroesophageal reflux disease)   . History of kidney stones    history of  . History of nuclear stress test 07/15/2008   lexiscan; mild-mod perfusion defect in mid anteroseptal, apical anterior, apical septal regions (attenuation artifiact), prominent gut uptake activity in infero-apical region; post-stress EF 64%; EKG negative for ischemia; patient experienced CP during test  . Hx of cardiac catheterization 2006   no significant CAD  . IBS (irritable bowel syndrome)   . Irritable bowel syndrome   . Nausea 05/28/2015  . Neck pain   . Numerous moles 01/18/2016  . RLQ abdominal pain 05/28/2015  . Seizures ([place])    2 years ago; unknown etiology and none since then and on no meds  . Vaginal discharge 05/28/2015  . Weight gain 01/18/2016    Past Surgical History:  Procedure Laterality Date  . BIOPSY N/A 02/25/2014   Procedure: BIOPSY;  Surgeon: Rogene Houston, MD;  [facility];  Service: Endoscopy;  Laterality: N/A;  . BREAST ENHANCEMENT SURGERY    . CARDIAC CATHETERIZATION  1025//2006   no significant CAD, mild-mod depressed LV systolic function, EF 02-54%, mild-mod AI (Dr. Gerrie Nordmann)   . CARPAL TUNNEL RELEASE Bilateral 01/01/2016  . CESAREAN SECTION    . COLONOSCOPY  05/27/2011   snare polypectomy   . COLONOSCOPY WITH PROPOFOL N/A  10/21/2016   Procedure: COLONOSCOPY WITH PROPOFOL;  Surgeon: Rogene Houston, MD;  [facility];  Service: Endoscopy;  Laterality: N/A;  10:30  . ESOPHAGOGASTRODUODENOSCOPY N/A 02/25/2014   Procedure: ESOPHAGOGASTRODUODENOSCOPY (EGD);  Surgeon: Rogene Houston, MD;  [facility];  Service: Endoscopy;  Laterality: N/A;  730  . KNEE SURGERY Right   . PARTIAL KNEE  ARTHROPLASTY Right 03/07/2018   Procedure: Right knee medial unicompartmental arthroplasty;  Surgeon: Gaynelle Arabian, MD;  [facility];  Service: Orthopedics;  Laterality: Right;  with block  . TRANSTHORACIC ECHOCARDIOGRAM  06/176   LV systolic function is low-normal; RV is normal in size & function; mild MR, mild TR  . TUBAL LIGATION      Allergies  Allergen Reactions  . Dicyclomine Palpitations    Patient states that her heart rate will go real fast when she takes this medication.  . Other Other (See Comments)    House dust  . Flagyl [Metronidazole Hcl] Nausea And Vomiting  . Metronidazole   . Morphine And Related Other (See Comments)    Headache   . Penicillins Other (See Comments)    Caused patient to pass out Has patient had a PCN reaction causing immediate rash, facial/tongue/throat swelling, SOB or lightheadedness with hypotension: unknown Has patient had a PCN reaction causing severe rash involving mucus membranes or skin necrosis: No Has patient had a PCN reaction that required hospitalization No Has patient had a PCN reaction occurring within the last 10 years: No If all of the above answers are "NO", then may proceed with Cephalosporin use.      Current Outpatient Medications on File Prior to Visit  Medication Sig Dispense Refill  . aspirin EC 81 MG tablet Take 81 mg by mouth daily.    . cetirizine (ZYRTEC) 10 MG tablet Take 1 tablet (10 mg total) by mouth daily. 30 tablet 6  . ciprofloxacin (CIPRO) 500 MG tablet Take 500 mg by mouth 2 (two) times daily.    . DULoxetine (CYMBALTA) 60 MG capsule Take 1 capsule (60 mg total) by mouth daily. 90 capsule 0  . fluticasone (FLONASE) 50 MCG/ACT nasal spray Place 2 sprays into both nostrils daily.    . meloxicam (MOBIC) 15 MG tablet meloxicam 15 mg tablet  Take 1 tablet every day by oral route.    . methocarbamol (ROBAXIN) 500 MG tablet Take 1 tablet (500 mg total) by mouth every 6 (six) hours as needed for muscle spasms. 60  tablet 0  . ondansetron (ZOFRAN) 8 MG tablet TAKE 1 TABLET BY MOUTH EVERY 8 HOURS AS NEEDED FOR NAUSEA AND VOMITING. 20 tablet 0  . oxyCODONE-acetaminophen (PERCOCET) 10-325 MG tablet oxycodone-acetaminophen 10 mg-325 mg tablet  Take 1 tablet 4 times a day by oral route as needed.    . pantoprazole (PROTONIX) 20 MG tablet TAKE (1) TABLET TWICE A DAY BEFORE MEALS. 60 tablet 4  . PROAIR HFA 108 (90 Base) MCG/ACT inhaler USE 2 PUFFS EVERY 6 HOURS AS NEEDED FOR WHEEZING OR SHORTNESS OF BREATH. 8.5 g 0  . QUEtiapine (SEROQUEL) 25 MG tablet Take 1 tablet (25 mg total) by mouth at bedtime. 90 tablet 0  . traZODone (DESYREL) 50 MG tablet 25-50 mg at night as needed for insomnia 90 tablet 0  . UNABLE TO FIND Migraine med-one at bedtime    . estradiol (ESTRACE) 1 MG tablet Take 1 tablet (1 mg total) by mouth daily. (Patient not taking: Reported on 03/12/2019) 30 tablet 3  .  hyoscyamine (LEVSIN SL) 0.125 MG SL tablet Place 1 tablet (0.125 mg total) under the tongue every 4 (four) hours as needed. (Patient not taking: Reported on 03/12/2019) 30 tablet 0  . LORazepam (ATIVAN) 1 MG tablet Take 1 tablet (1 mg total) by mouth 2 (two) times daily as needed for anxiety. (Patient not taking: Reported on 05/29/2019) 60 tablet 1  . progesterone (PROMETRIUM) 200 MG capsule Take 1 daily at bedtime (Patient not taking: Reported on 05/29/2019) 30 capsule 3  . traMADol (ULTRAM) 50 MG tablet Take 1 tablet (50 mg total) by mouth every 6 (six) hours as needed. (Patient not taking: Reported on 03/12/2019) 20 tablet 0   No current facility-administered medications on file prior to visit.         Objective:   Physical Exam Blood pressure 127/74, pulse (!) 101, temperature 98.1 F (36.7 C), height 5' (1.524 m), weight 124 lb 14.4 oz (56.7 kg). Alert and oriented. Skin warm and dry. Oral mucosa is moist.   . Sclera anicteric, conjunctivae is pink. Thyroid not enlarged. No cervical lymphadenopathy. Lungs clear. Heart regular rate and  rhythm.  Abdomen is soft. Bowel sounds are positive. No hepatomegaly. No abdominal masses felt. Diffuse tenderness.  No edema to lower extremities.          Assessment & Plan:  Abdominal pain. Am going to get records from [facility]. Further recommendations to follow.  Abnormal CT: May need an EGD with propafol . Will discuss with Dr. Laural Golden.

## 2019-05-30 ENCOUNTER — Ambulatory Visit (INDEPENDENT_AMBULATORY_CARE_PROVIDER_SITE_OTHER): Payer: Self-pay | Admitting: Internal Medicine

## 2019-06-03 DIAGNOSIS — M4310 Spondylolisthesis, site unspecified: Secondary | ICD-10-CM | POA: Insufficient documentation

## 2019-06-03 DIAGNOSIS — M5030 Other cervical disc degeneration, unspecified cervical region: Secondary | ICD-10-CM | POA: Insufficient documentation

## 2019-06-05 ENCOUNTER — Ambulatory Visit (INDEPENDENT_AMBULATORY_CARE_PROVIDER_SITE_OTHER): Payer: Medicaid Other | Admitting: Psychiatry

## 2019-06-05 ENCOUNTER — Encounter (HOSPITAL_COMMUNITY): Payer: Self-pay | Admitting: Psychiatry

## 2019-06-05 DIAGNOSIS — F4310 Post-traumatic stress disorder, unspecified: Secondary | ICD-10-CM

## 2019-06-05 DIAGNOSIS — F320 Major depressive disorder, single episode, mild: Secondary | ICD-10-CM | POA: Diagnosis not present

## 2019-06-05 NOTE — Patient Instructions (Signed)
1.Continue duloxetine60 mg daily  2. Continuequetiapine 25 mg at night  3. Continue lorazepam 1 mg twice a day as needed for anxiety 4.ContinueTrazodone 25-50 mg at night as needed for sleep 5. Next appointment 8/11 at 1:40

## 2019-06-06 ENCOUNTER — Telehealth (INDEPENDENT_AMBULATORY_CARE_PROVIDER_SITE_OTHER): Payer: Self-pay | Admitting: Internal Medicine

## 2019-06-06 DIAGNOSIS — K219 Gastro-esophageal reflux disease without esophagitis: Secondary | ICD-10-CM

## 2019-06-06 MED ORDER — PANTOPRAZOLE SODIUM 40 MG PO TBEC: 40.0000 mg | DELAYED_RELEASE_TABLET | Freq: Two times a day (BID) | ORAL | 4 refills | Status: DC

## 2019-06-06 NOTE — Telephone Encounter (Signed)
Rx for Protonix BID sent to her pharmacy.

## 2019-06-06 NOTE — Telephone Encounter (Signed)
err

## 2019-06-06 NOTE — Telephone Encounter (Signed)
I have called patient several times today to let her know that I talked with Dr. Laural Golden yesterday. She needs an EGD/Colonoscopy

## 2019-06-06 NOTE — Telephone Encounter (Signed)
Ann, EGD/Colonoscopy 6 weeks out.   Abnormal CT of stomach and diverticulitis.

## 2019-06-24 ENCOUNTER — Other Ambulatory Visit (INDEPENDENT_AMBULATORY_CARE_PROVIDER_SITE_OTHER): Payer: Self-pay | Admitting: Internal Medicine

## 2019-06-24 DIAGNOSIS — R935 Abnormal findings on diagnostic imaging of other abdominal regions, including retroperitoneum: Secondary | ICD-10-CM

## 2019-06-24 DIAGNOSIS — K5732 Diverticulitis of large intestine without perforation or abscess without bleeding: Secondary | ICD-10-CM

## 2019-06-24 DIAGNOSIS — R1030 Lower abdominal pain, unspecified: Secondary | ICD-10-CM

## 2019-06-25 ENCOUNTER — Encounter (INDEPENDENT_AMBULATORY_CARE_PROVIDER_SITE_OTHER): Payer: Self-pay | Admitting: *Deleted

## 2019-06-25 ENCOUNTER — Telehealth (INDEPENDENT_AMBULATORY_CARE_PROVIDER_SITE_OTHER): Payer: Self-pay | Admitting: *Deleted

## 2019-06-25 MED ORDER — SUPREP BOWEL PREP KIT 17.5-3.13-1.6 GM/177ML PO SOLN: 1.0000 | Freq: Once | ORAL | 0 refills | Status: AC

## 2019-06-25 NOTE — Telephone Encounter (Signed)
Patient needs suprep 

## 2019-06-25 NOTE — Telephone Encounter (Signed)
TCS/EGD sch'd 08/02/19, patient aware, instructions mailed

## 2019-07-01 ENCOUNTER — Ambulatory Visit (HOSPITAL_COMMUNITY): Payer: Medicaid Other | Admitting: Psychiatry
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
## 2019-07-16 ENCOUNTER — Other Ambulatory Visit: Payer: Self-pay

## 2019-07-16 ENCOUNTER — Ambulatory Visit (INDEPENDENT_AMBULATORY_CARE_PROVIDER_SITE_OTHER): Payer: Medicaid Other | Admitting: Psychiatry

## 2019-07-16 ENCOUNTER — Encounter (HOSPITAL_COMMUNITY): Payer: Self-pay | Admitting: Psychiatry

## 2019-07-16 DIAGNOSIS — F320 Major depressive disorder, single episode, mild: Secondary | ICD-10-CM

## 2019-07-16 DIAGNOSIS — F4310 Post-traumatic stress disorder, unspecified: Secondary | ICD-10-CM

## 2019-07-16 MED ORDER — QUETIAPINE FUMARATE 50 MG PO TABS: 50.0000 mg | ORAL_TABLET | Freq: Every day | ORAL | 0 refills | Status: DC

## 2019-07-16 MED ORDER — DULOXETINE HCL 60 MG PO CPEP: 60.0000 mg | ORAL_CAPSULE | Freq: Every day | ORAL | 0 refills | Status: DC

## 2019-07-16 MED ORDER — TRAZODONE HCL 50 MG PO TABS: ORAL_TABLET | ORAL | 0 refills | Status: DC

## 2019-07-16 MED ORDER — LORAZEPAM 1 MG PO TABS: 1.0000 mg | ORAL_TABLET | Freq: Two times a day (BID) | ORAL | 1 refills | Status: DC | PRN

## 2019-07-16 NOTE — Patient Instructions (Signed)
1.Continue duloxetine60 mg daily  2. Increasequetiapine 50 mg at night  3. Continue lorazepam 1 mg twice a day as needed for anxiety  4.ContinueTrazodone 25-50 mg at night as needed for sleep 5. Next appointment:  10/20 at 1:20 6. Check blood test (TSH) at [place]

## 2019-07-29 ENCOUNTER — Other Ambulatory Visit (HOSPITAL_COMMUNITY): Payer: Self-pay | Admitting: Psychiatry

## 2019-07-30 ENCOUNTER — Telehealth (HOSPITAL_COMMUNITY): Payer: Self-pay | Admitting: *Deleted

## 2019-07-30 ENCOUNTER — Encounter (HOSPITAL_COMMUNITY): Payer: Self-pay | Admitting: Psychiatry

## 2019-07-30 LAB — TSH: TSH: 0.34 mIU/L — ABNORMAL LOW

## 2019-07-30 NOTE — Telephone Encounter (Signed)
Noted, thanks. Her medication is recently uptitrated, and would like to see if that would be effective before making any more change. Please also make sure that she has follow up appointment with her therapist.

## 2019-07-30 NOTE — Telephone Encounter (Signed)
SPOKE WITH PATIENT & SHE'S ASKING TO WITH YOU. THAT SHE NEEDS TO TELL YOU SOMETHING. PATIENT CAME IN ON YESTERDAY TO PICK UP LAB ORDERS & NO MENTION OF A REQUEST TO SPEAK WITH THE DOCTOR. TODAY SHE NEEDS TO LET YOU KNOW ABOUT HER MEDICATION (MAY NEED INCREASE) & HER ANXIETY. I INFORMED HER THOSE CONCERNS WOULD REQUIRE A APPT FOR A OFFICE VISIT SO YOU CAN SHARE YOUR ISSUES. I OFFER TO MAKE HER APPT I DID 1ST INFORM HER THAT THE  EARLIEST APPT AVAILABLE WOULD BE IN OCTOBER (WHICH SHE'S ALREADY SCHEDULE). SHE STATES I THOUGHT MAYBE THE DOCTOR COULD CALL ME WHEN SHE GET'S A MOMENT.  I INFORM HER THAT HER CONCERNS REQUIRE A VISIT & ASKED IF ANY SI? SHE REPLIED NO. I DID TELL HER THE DOCTOR IS SEEING OTHER PATIENT'S THAT HAVE BEEN SCHEDULE PRIOR TO YOU & IT MAY BE NOT UNTIL YOUR SCHEDULED APPT UNTIL YOU SPEAK WITH THE [place]

## 2019-07-31 ENCOUNTER — Other Ambulatory Visit (HOSPITAL_COMMUNITY)
Admission: RE | Admit: 2019-07-31 | Discharge: 2019-07-31 | Disposition: A | Discharge status: Home / Self Care | Payer: Medicaid Other | Source: Ambulatory Visit | Attending: Internal Medicine | Admitting: Internal Medicine

## 2019-07-31 ENCOUNTER — Telehealth (HOSPITAL_COMMUNITY): Payer: Self-pay | Admitting: Psychiatry

## 2019-07-31 ENCOUNTER — Encounter (HOSPITAL_COMMUNITY)
Admission: RE | Admit: 2019-07-31 | Discharge: 2019-07-31 | Disposition: A | Discharge status: Home / Self Care | Payer: Medicaid Other | Source: Ambulatory Visit | Attending: Internal Medicine | Admitting: Internal Medicine

## 2019-07-31 ENCOUNTER — Other Ambulatory Visit: Payer: Self-pay

## 2019-07-31 DIAGNOSIS — Z01812 Encounter for preprocedural laboratory examination: Secondary | ICD-10-CM | POA: Diagnosis not present

## 2019-07-31 DIAGNOSIS — Z20828 Contact with and (suspected) exposure to other viral communicable diseases: Secondary | ICD-10-CM | POA: Diagnosis not present

## 2019-07-31 LAB — SARS CORONAVIRUS 2 (TAT 6-24 HRS): SARS Coronavirus 2: NEGATIVE

## 2019-07-31 NOTE — Telephone Encounter (Signed)
Left message to inquire about her concerns about medication she discussed with the nurse. She is not available on the phone. Left voice message to contact the office if she has any concerns.

## 2019-07-31 NOTE — Telephone Encounter (Signed)
LVM: Her medication is recently uptitrated, and would like to see if that would be effective before making any more change. Please also make sure that she has follow up appointment with her therapist

## 2019-08-02 ENCOUNTER — Ambulatory Visit (HOSPITAL_COMMUNITY): Payer: Medicaid Other | Admitting: Anesthesiology

## 2019-08-02 ENCOUNTER — Encounter (HOSPITAL_COMMUNITY)
Admission: RE | Disposition: A | Discharge status: Home / Self Care | Payer: Self-pay | Source: Home / Self Care | Attending: Internal Medicine

## 2019-08-02 ENCOUNTER — Ambulatory Visit (HOSPITAL_COMMUNITY)
Admission: RE | Admit: 2019-08-02 | Discharge: 2019-08-02 | Disposition: A | Discharge status: Home / Self Care | Payer: Medicaid Other | Attending: Internal Medicine | Admitting: Internal Medicine

## 2019-08-02 ENCOUNTER — Other Ambulatory Visit: Payer: Self-pay

## 2019-08-02 ENCOUNTER — Encounter (HOSPITAL_COMMUNITY): Payer: Self-pay | Admitting: *Deleted

## 2019-08-02 DIAGNOSIS — K648 Other hemorrhoids: Secondary | ICD-10-CM | POA: Insufficient documentation

## 2019-08-02 DIAGNOSIS — Z806 Family history of leukemia: Secondary | ICD-10-CM | POA: Insufficient documentation

## 2019-08-02 DIAGNOSIS — R935 Abnormal findings on diagnostic imaging of other abdominal regions, including retroperitoneum: Secondary | ICD-10-CM | POA: Insufficient documentation

## 2019-08-02 DIAGNOSIS — K921 Melena: Secondary | ICD-10-CM | POA: Diagnosis not present

## 2019-08-02 DIAGNOSIS — K5900 Constipation, unspecified: Secondary | ICD-10-CM | POA: Insufficient documentation

## 2019-08-02 DIAGNOSIS — Z881 Allergy status to other antibiotic agents status: Secondary | ICD-10-CM | POA: Insufficient documentation

## 2019-08-02 DIAGNOSIS — M1990 Unspecified osteoarthritis, unspecified site: Secondary | ICD-10-CM | POA: Insufficient documentation

## 2019-08-02 DIAGNOSIS — Z8249 Family history of ischemic heart disease and other diseases of the circulatory system: Secondary | ICD-10-CM | POA: Insufficient documentation

## 2019-08-02 DIAGNOSIS — Z791 Long term (current) use of non-steroidal anti-inflammatories (NSAID): Secondary | ICD-10-CM | POA: Insufficient documentation

## 2019-08-02 DIAGNOSIS — R51 Headache: Secondary | ICD-10-CM | POA: Diagnosis not present

## 2019-08-02 DIAGNOSIS — Z888 Allergy status to other drugs, medicaments and biological substances status: Secondary | ICD-10-CM | POA: Diagnosis not present

## 2019-08-02 DIAGNOSIS — I351 Nonrheumatic aortic (valve) insufficiency: Secondary | ICD-10-CM | POA: Diagnosis not present

## 2019-08-02 DIAGNOSIS — D125 Benign neoplasm of sigmoid colon: Secondary | ICD-10-CM | POA: Diagnosis not present

## 2019-08-02 DIAGNOSIS — J45909 Unspecified asthma, uncomplicated: Secondary | ICD-10-CM | POA: Insufficient documentation

## 2019-08-02 DIAGNOSIS — Z885 Allergy status to narcotic agent status: Secondary | ICD-10-CM | POA: Diagnosis not present

## 2019-08-02 DIAGNOSIS — K644 Residual hemorrhoidal skin tags: Secondary | ICD-10-CM | POA: Diagnosis not present

## 2019-08-02 DIAGNOSIS — R1013 Epigastric pain: Secondary | ICD-10-CM | POA: Insufficient documentation

## 2019-08-02 DIAGNOSIS — Z7982 Long term (current) use of aspirin: Secondary | ICD-10-CM | POA: Insufficient documentation

## 2019-08-02 DIAGNOSIS — Z8489 Family history of other specified conditions: Secondary | ICD-10-CM | POA: Insufficient documentation

## 2019-08-02 DIAGNOSIS — M549 Dorsalgia, unspecified: Secondary | ICD-10-CM | POA: Insufficient documentation

## 2019-08-02 DIAGNOSIS — K6289 Other specified diseases of anus and rectum: Secondary | ICD-10-CM | POA: Insufficient documentation

## 2019-08-02 DIAGNOSIS — F329 Major depressive disorder, single episode, unspecified: Secondary | ICD-10-CM | POA: Diagnosis not present

## 2019-08-02 DIAGNOSIS — F419 Anxiety disorder, unspecified: Secondary | ICD-10-CM | POA: Insufficient documentation

## 2019-08-02 DIAGNOSIS — Z880 Allergy status to penicillin: Secondary | ICD-10-CM | POA: Insufficient documentation

## 2019-08-02 DIAGNOSIS — E785 Hyperlipidemia, unspecified: Secondary | ICD-10-CM | POA: Insufficient documentation

## 2019-08-02 DIAGNOSIS — Z87442 Personal history of urinary calculi: Secondary | ICD-10-CM | POA: Insufficient documentation

## 2019-08-02 DIAGNOSIS — K589 Irritable bowel syndrome without diarrhea: Secondary | ICD-10-CM | POA: Diagnosis not present

## 2019-08-02 DIAGNOSIS — R921 Mammographic calcification found on diagnostic imaging of breast: Secondary | ICD-10-CM

## 2019-08-02 DIAGNOSIS — Z820 Family history of epilepsy and other diseases of the nervous system: Secondary | ICD-10-CM | POA: Insufficient documentation

## 2019-08-02 DIAGNOSIS — K5732 Diverticulitis of large intestine without perforation or abscess without bleeding: Secondary | ICD-10-CM | POA: Insufficient documentation

## 2019-08-02 DIAGNOSIS — Z79899 Other long term (current) drug therapy: Secondary | ICD-10-CM | POA: Insufficient documentation

## 2019-08-02 DIAGNOSIS — R197 Diarrhea, unspecified: Secondary | ICD-10-CM | POA: Insufficient documentation

## 2019-08-02 DIAGNOSIS — K219 Gastro-esophageal reflux disease without esophagitis: Secondary | ICD-10-CM | POA: Diagnosis not present

## 2019-08-02 DIAGNOSIS — F17210 Nicotine dependence, cigarettes, uncomplicated: Secondary | ICD-10-CM | POA: Insufficient documentation

## 2019-08-02 DIAGNOSIS — R933 Abnormal findings on diagnostic imaging of other parts of digestive tract: Secondary | ICD-10-CM | POA: Diagnosis not present

## 2019-08-02 HISTORY — PX: COLONOSCOPY WITH PROPOFOL: SHX5780

## 2019-08-02 HISTORY — PX: ESOPHAGOGASTRODUODENOSCOPY (EGD) WITH PROPOFOL: SHX5813

## 2019-08-02 HISTORY — PX: POLYPECTOMY: SHX5525

## 2019-08-02 LAB — PREGNANCY, URINE: Preg Test, Ur: NEGATIVE

## 2019-08-02 SURGERY — COLONOSCOPY WITH PROPOFOL
Anesthesia: General

## 2019-08-02 MED ORDER — CHLORHEXIDINE GLUCONATE CLOTH 2 % EX PADS: 6.0000 | MEDICATED_PAD | Freq: Once | CUTANEOUS | Status: DC

## 2019-08-02 MED ORDER — KETAMINE HCL 50 MG/5ML IJ SOSY
PREFILLED_SYRINGE | INTRAMUSCULAR | Status: AC
  Filled 2019-08-02: qty 5

## 2019-08-02 MED ORDER — GLYCOPYRROLATE 0.2 MG/ML IJ SOLN
INTRAMUSCULAR | Status: DC | PRN
  Administered 2019-08-02: 0.2 mg via INTRAVENOUS

## 2019-08-02 MED ORDER — PROPOFOL 10 MG/ML IV BOLUS
INTRAVENOUS | Status: AC
  Filled 2019-08-02: qty 40

## 2019-08-02 MED ORDER — SIMETHICONE 40 MG/0.6ML PO SUSP
ORAL | Status: AC
  Filled 2019-08-02: qty 0.6

## 2019-08-02 MED ORDER — PROPOFOL 10 MG/ML IV BOLUS
INTRAVENOUS | Status: DC | PRN
  Administered 2019-08-02 (×3): 20 mg via INTRAVENOUS
  Administered 2019-08-02 (×2): 10 mg via INTRAVENOUS
  Administered 2019-08-02: 20 mg via INTRAVENOUS

## 2019-08-02 MED ORDER — LACTATED RINGERS IV SOLN
INTRAVENOUS | Status: DC
  Administered 2019-08-02: 07:00:00 1000 mL via INTRAVENOUS

## 2019-08-02 MED ORDER — MIDAZOLAM HCL 2 MG/2ML IJ SOLN
INTRAMUSCULAR | Status: AC
  Filled 2019-08-02: qty 2

## 2019-08-02 MED ORDER — KETAMINE HCL 10 MG/ML IJ SOLN
INTRAMUSCULAR | Status: DC | PRN
  Administered 2019-08-02 (×2): 10 mg via INTRAVENOUS

## 2019-08-02 MED ORDER — PROPOFOL 500 MG/50ML IV EMUL
INTRAVENOUS | Status: DC | PRN
  Administered 2019-08-02: 150 ug/kg/min via INTRAVENOUS

## 2019-08-02 MED ORDER — EPHEDRINE SULFATE 50 MG/ML IJ SOLN
INTRAMUSCULAR | Status: AC
  Filled 2019-08-02: qty 2

## 2019-08-02 NOTE — Op Note (Signed)
[facility] Patient Name: Julie [name] Procedure Date: 08/02/2019 7:45 AM MRN: [id number] Date of Birth: March 29, 1971 Attending MD: Hildred Laser , MD CSN: [id number] Age: 48 Admit Type: Outpatient Procedure:                Colonoscopy Indications:              Abnormal CT of the GI tract Providers:                Hildred Laser, MD, Hinton Rao, RN, Raphael Gibney, Technician Referring MD:             [facility] Internal medicine Medicines:                Propofol per Anesthesia Complications:            No immediate complications. Estimated Blood Loss:     Estimated blood loss was minimal. Procedure:                Pre-Anesthesia Assessment:                           - Prior to the procedure, a History and Physical                            was performed, and patient medications and                            allergies were reviewed. The patient's tolerance of                            previous anesthesia was also reviewed. The risks                            and benefits of the procedure and the sedation                            options and risks were discussed with the patient.                            All questions were answered, and informed consent                            was obtained. Prior Anticoagulants: The patient has                            taken no previous anticoagulant or antiplatelet                            agents except for aspirin and has taken no previous                            anticoagulant or antiplatelet agents except for  NSAID medication. ASA Grade Assessment: III - A                            patient with severe systemic disease. After                            reviewing the risks and benefits, the patient was                            deemed in satisfactory condition to undergo the                            procedure.                           After obtaining informed consent, the  colonoscope                            was passed under direct vision. Throughout the                            procedure, the patient's blood pressure, pulse, and                            oxygen saturations were monitored continuously. The                            [id number] scope was introduced through                            the anus and advanced to the the cecum, identified                            by appendiceal orifice and ileocecal valve. The                            colonoscopy was performed without difficulty. The                            patient tolerated the procedure well. The quality                            of the bowel preparation was good. The ileocecal                            valve, appendiceal orifice, and rectum were                            photographed. Scope In: 7:47:03 AM Scope Out: 8:02:32 AM Scope Withdrawal Time: 0 hours 7 minutes 29 seconds  Total Procedure Duration: 0 hours 15 minutes 29 seconds  Findings:      The perianal and digital rectal examinations were normal.      A 4 mm polyp was found in the distal sigmoid colon. The polyp was  removed with a cold snare. Resection and retrieval were complete.      External and internal hemorrhoids were found during retroflexion. The       hemorrhoids were small.      Anal papilla(e) were hypertrophied. Impression:               - One 4 mm polyp in the distal sigmoid colon,                            removed with a cold snare. Resected and retrieved.                           - External and internal hemorrhoids.                           - Anal papilla(e) were hypertrophied. Moderate Sedation:      Per Anesthesia Care Recommendation:           - Patient has a contact number available for                            emergencies. The signs and symptoms of potential                            delayed complications were discussed with the                            patient. Return to  normal activities tomorrow.                            Written discharge instructions were provided to the                            patient.                           - Resume previous diet today.                           - Continue present medications.                           - No aspirin, ibuprofen, naproxen, or other                            non-steroidal anti-inflammatory drugs for 1 day.                           - Await pathology results.                           - Repeat colonoscopy in 7 years for surveillance. Procedure Code(s):        --- Professional ---                           [phone], Colonoscopy, flexible; with removal of  tumor(s), polyp(s), or other lesion(s) by snare                            technique Diagnosis Code(s):        --- Professional ---                           K63.5, Polyp of colon                           K62.89, Other specified diseases of anus and rectum                           K64.8, Other hemorrhoids                           R93.3, Abnormal findings on diagnostic imaging of                            other parts of digestive tract CPT copyright 2019 American Medical Association. All rights reserved. The codes documented in this report are preliminary and upon coder review may  be revised to meet current compliance requirements. Hildred Laser, MD Hildred Laser, MD 08/02/2019 8:19:00 AM This report has been signed electronically. Number of Addenda: 0

## 2019-08-02 NOTE — H&P (Signed)
Julie [name] is an 48 y.o. female.   Chief Complaint: Patient is here for esophagogastroduodenoscopy and colonoscopy. HPI: Patient 48 year old Caucasian female with multiple medical problems who was hospitalized at [facility] in June for abdominal pain.  Abdominal pelvic CT revealed wall thickening of pyloric channel different than on prior CT as well as sigmoid wall thickening.  She was felt to either have focal colitis or diverticulitis.  She was treated with antibiotic for 3 weeks.  She was seen by surgeon and endoscopic evaluation was recommended but she signed AMA wanted to come here. She complains of intermittent tarry stools.  She does not take iron or Pepto-Bismol.  She is on low-dose aspirin on as-needed basis.  She may take OTC NSAIDs or meloxicam on as-needed basis.  She states she is aware of potential side effects and tries not to take very often.  She also complains of epigastric periumbilical and hypogastric pain.  Her bowels are irregular.  She has diarrhea as well as constipation.  Last EGD was in 2015 and last colonoscopy was in 2017. Patient smokes half a pack of cigarettes per day and drinks alcohol occasionally. Family history is negative for CRC or IBD. Forward testing is negative.  Urine pregnancy test from this morning is also negative.   Past Medical History:  Diagnosis Date  . Anger   . Anxiety   . Aortic insufficiency 2006   Noted at heart cath - mild to moderate  . Arthritis   . Asthma   . Back pain   . BV (bacterial vaginosis) 05/28/2015  .    Marland Kitchen Chronic headache   . Depression   . Diarrhea 05/28/2015  . Dyslipidemia 01/19/2016  . Fibroids, submucosal 10/24/2018  . GERD (gastroesophageal reflux disease)   . History of kidney stones    history of  . History of nuclear stress test 07/15/2008   lexiscan; mild-mod perfusion defect in mid anteroseptal, apical anterior, apical septal regions (attenuation artifiact), prominent gut uptake activity in infero-apical region;  post-stress EF 64%; EKG negative for ischemia; patient experienced CP during test  . Hx of cardiac catheterization 2006   no significant CAD  . IBS (irritable bowel syndrome)   . Irritable bowel syndrome   . Nausea 05/28/2015  . Neck pain   . Numerous moles 01/18/2016  . RLQ abdominal pain 05/28/2015  . Seizures ([place])    2 years ago; unknown etiology and none since then and on no meds  . Vaginal discharge 05/28/2015  . Weight gain 01/18/2016    Past Surgical History:  Procedure Laterality Date  . BIOPSY N/A 02/25/2014   Procedure: BIOPSY;  Surgeon: Rogene Houston, MD;  Location: [facility];  Service: Endoscopy;  Laterality: N/A;  . BREAST ENHANCEMENT SURGERY    . CARDIAC CATHETERIZATION  1025//2006   no significant CAD, mild-mod depressed LV systolic function, EF 123456, mild-mod AI (Dr. Gerrie Nordmann)   . CARPAL TUNNEL RELEASE Bilateral 01/01/2016  . CESAREAN SECTION    . COLONOSCOPY  05/27/2011   snare polypectomy   . COLONOSCOPY WITH PROPOFOL N/A 10/21/2016   Procedure: COLONOSCOPY WITH PROPOFOL;  Surgeon: Rogene Houston, MD;  Location: [facility];  Service: Endoscopy;  Laterality: N/A;  10:30  . ESOPHAGOGASTRODUODENOSCOPY N/A 02/25/2014   Procedure: ESOPHAGOGASTRODUODENOSCOPY (EGD);  Surgeon: Rogene Houston, MD;  Location: [facility];  Service: Endoscopy;  Laterality: N/A;  730  . KNEE SURGERY Right   . PARTIAL KNEE ARTHROPLASTY Right 03/07/2018   Procedure: Right  knee medial unicompartmental arthroplasty;  Surgeon: Gaynelle Arabian, MD;  Location: [facility];  Service: Orthopedics;  Laterality: Right;  with block  . TRANSTHORACIC ECHOCARDIOGRAM  [id number]   LV systolic function is low-normal; RV is normal in size & function; mild MR, mild TR  . TUBAL LIGATION      Family History  Problem Relation Age of Onset  . Other Mother        heart problems  . Other Brother        neck and back surgery  . Other Maternal Grandmother        brain tumor  . Leukemia Maternal Grandfather    . Dementia Father    Social History:  reports that she has been smoking cigarettes. She started smoking about 32 years ago. She has a 14.00 pack-year smoking history. She has never used smokeless tobacco. She reports current alcohol use of about 1.0 standard drinks of alcohol per week. She reports current drug use. Drug: Marijuana.  Allergies:  Allergies  Allergen Reactions  . Dicyclomine Palpitations    Patient states that her heart rate will go real fast when she takes this medication.  . Other Other (See Comments)    House dust  . Flagyl [Metronidazole Hcl] Nausea And Vomiting  . Morphine And Related Other (See Comments)    Headache   . Penicillins Other (See Comments)    Caused patient to pass out Did it involve swelling of the face/tongue/throat, SOB, or low BP? No Did it involve sudden or severe rash/hives, skin peeling, or any reaction on the inside of your mouth or nose? No Did you need to seek medical attention at a hospital or doctor's office? No When did it last happen?20 + years If all above answers are "NO", may proceed with cephalosporin use.     Medications Prior to Admission  Medication Sig Dispense Refill  . aspirin EC 81 MG tablet Take 81 mg by mouth daily as needed (chest pain).     Derrill Memo ON 08/06/2019] DULoxetine (CYMBALTA) 60 MG capsule Take 1 capsule (60 mg total) by mouth daily. 90 capsule 0  . fluticasone (FLONASE) 50 MCG/ACT nasal spray Place 2 sprays into both nostrils daily as needed for allergies.     Marland Kitchen loratadine (CLARITIN) 10 MG tablet Take 10 mg by mouth daily.    Marland Kitchen LORazepam (ATIVAN) 1 MG tablet Take 1 tablet (1 mg total) by mouth 2 (two) times daily as needed for anxiety. (Patient taking differently: Take 1-2 mg by mouth 2 (two) times daily as needed for anxiety. ) 60 tablet 1  . meloxicam (MOBIC) 15 MG tablet Take 15 mg by mouth daily as needed for pain.     Marland Kitchen ondansetron (ZOFRAN) 8 MG tablet TAKE 1 TABLET BY MOUTH EVERY 8 HOURS AS NEEDED FOR  NAUSEA AND VOMITING. (Patient taking differently: Take 8 mg by mouth every 8 (eight) hours as needed for nausea or vomiting. ) 20 tablet 0  . oxyCODONE-acetaminophen (PERCOCET) 10-325 MG tablet Take 1 tablet by mouth every 6 (six) hours as needed for pain.     . pantoprazole (PROTONIX) 40 MG tablet Take 1 tablet (40 mg total) by mouth 2 (two) times daily before a meal. 30 tablet 4  . Phenylephrine-Acetaminophen (TYLENOL SINUS+HEADACHE PO) Take 1 tablet by mouth daily as needed (sinus headaches).    Marland Kitchen PROAIR HFA 108 (90 Base) MCG/ACT inhaler USE 2 PUFFS EVERY 6 HOURS AS NEEDED FOR WHEEZING OR SHORTNESS OF BREATH. (Patient taking  differently: Inhale 2 puffs into the lungs every 6 (six) hours as needed for wheezing or shortness of breath. ) 8.5 g 0  . promethazine (PHENERGAN) 25 MG tablet Take 25 mg by mouth every 6 (six) hours as needed for nausea or vomiting.    Marland Kitchen QUEtiapine (SEROQUEL) 50 MG tablet Take 1 tablet (50 mg total) by mouth at bedtime. 90 tablet 0  . Tetrahydrozoline HCl (VISINE OP) Place 1 drop into both eyes daily as needed (dry / allergy eyes).    Derrill Memo ON 08/06/2019] traZODone (DESYREL) 50 MG tablet 25-50 mg at night as needed for insomnia (Patient taking differently: Take 50 mg by mouth at bedtime. ) 90 tablet 0  . cetirizine (ZYRTEC) 10 MG tablet Take 1 tablet (10 mg total) by mouth daily. (Patient not taking: Reported on 07/25/2019) 30 tablet 6  . estradiol (ESTRACE) 1 MG tablet Take 1 tablet (1 mg total) by mouth daily. (Patient not taking: Reported on 03/12/2019) 30 tablet 3  . progesterone (PROMETRIUM) 200 MG capsule Take 1 daily at bedtime (Patient not taking: Reported on 05/29/2019) 30 capsule 3    Results for orders placed or performed during the hospital encounter of 08/02/19 (from the past 48 hour(s))  Pregnancy, urine     Status: None   Collection Time: 08/02/19  6:49 AM  Result Value Ref Range   Preg Test, Ur NEGATIVE NEGATIVE    Comment:        THE SENSITIVITY OF  THIS METHODOLOGY IS >20 mIU/mL. Performed at [facility], [address] [place], [place] [address]    No results found.  ROS  Blood pressure 101/68, temperature 98.1 F (36.7 C), temperature source Esophageal, resp. rate 15, height 5' (1.524 m), weight 58.5 kg, SpO2 96 %. Physical Exam  Constitutional: She appears well-developed and well-nourished.  HENT:  Mouth/Throat: Oropharynx is clear and moist.  Eyes: Conjunctivae are normal. No scleral icterus.  Neck: No thyromegaly present.  Cardiovascular: Normal rate, regular rhythm and normal heart sounds.  No murmur heard. Respiratory: Effort normal and breath sounds normal.  GI:  Abdomen is symmetrical with laparoscopy scars across upper abdomen from prior cholecystectomy.  Abdomen is soft.  She has tenderness across lower abdomen as well as LUQ.  She has mild to moderate midepigastric tenderness.  No organomegaly or masses.  Musculoskeletal:        General: No edema.  Lymphadenopathy:    She has no cervical adenopathy.  Neurological: She is alert.  Skin: Skin is warm and dry.     Assessment/Plan Abdominal pain irregular bowel movements history of melena. Abnormal CT revealing pyloric channel abnormality as well as sigmoid colon wall thickening. Diagnostic EGD and colonoscopy.  Hildred Laser, MD 08/02/2019, 7:21 AM

## 2019-08-02 NOTE — Transfer of Care (Signed)
Immediate Anesthesia Transfer of Care Note  Patient: Julie [name]  Procedure(s) Performed: COLONOSCOPY WITH PROPOFOL (N/A ) ESOPHAGOGASTRODUODENOSCOPY (EGD) WITH PROPOFOL (N/A ) POLYPECTOMY  Patient Location: PACU  Anesthesia Type:General  Level of Consciousness: awake  Airway & Oxygen Therapy: Patient Spontanous Breathing  Post-op Assessment: Report given to RN  Post vital signs: Reviewed and stable  Last Vitals:  Vitals Value Taken Time  BP 82/52 08/02/19 0813  Temp    Pulse    Resp 13 08/02/19 0814  SpO2    Vitals shown include unvalidated device data.  Last Pain:  Vitals:   08/02/19 0645  TempSrc: Esophageal  PainSc: 4       Patients Stated Pain Goal: 8 ([id number])  Complications: No apparent anesthesia complications

## 2019-08-02 NOTE — Op Note (Signed)
[facility] Patient Name: Julie [name] Procedure Date: 08/02/2019 6:50 AM MRN: [id number] Date of Birth: 02-12-1971 Attending MD: Hildred Laser , MD CSN: [id number] Age: 48 Admit Type: Outpatient Procedure:                Upper GI endoscopy Indications:              Epigastric abdominal pain, Melena, Abnormal CT of                            the GI tract Providers:                Hildred Laser, MD, Hinton Rao, RN, Raphael Gibney, Technician Referring MD:             [facility] Internal medicine Medicines:                Propofol per Anesthesia Complications:            No immediate complications. Estimated Blood Loss:     Estimated blood loss: none. Procedure:                Pre-Anesthesia Assessment:                           - Prior to the procedure, a History and Physical                            was performed, and patient medications and                            allergies were reviewed. The patient's tolerance of                            previous anesthesia was also reviewed. The risks                            and benefits of the procedure and the sedation                            options and risks were discussed with the patient.                            All questions were answered, and informed consent                            was obtained. Prior Anticoagulants: The patient has                            taken no previous anticoagulant or antiplatelet                            agents except for aspirin and has taken no previous  anticoagulant or antiplatelet agents except for                            NSAID medication. ASA Grade Assessment: III - A                            patient with severe systemic disease. After                            reviewing the risks and benefits, the patient was                            deemed in satisfactory condition to undergo the                            procedure.                         After obtaining informed consent, the endoscope was                            passed under direct vision. Throughout the                            procedure, the patient's blood pressure, pulse, and                            oxygen saturations were monitored continuously. The                            [id number]) scope was introduced through the                            mouth, and advanced to the second part of duodenum.                            The upper GI endoscopy was accomplished without                            difficulty. The patient tolerated the procedure                            well. Scope In: 7:36:55 AM Scope Out: 7:41:39 AM Total Procedure Duration: 0 hours 4 minutes 44 seconds  Findings:      The examined esophagus was normal.      The Z-line was regular and was found 37 cm from the incisors.      The entire examined stomach was normal.      The duodenal bulb and second portion of the duodenum were normal. Impression:               - Normal esophagus.                           - Z-line regular, 37 cm from the incisors.                           -  Normal stomach.                           - Normal duodenal bulb and second portion of the                            duodenum.                           - No specimens collected. Moderate Sedation:      Per Anesthesia Care Recommendation:           - Patient has a contact number available for                            emergencies. The signs and symptoms of potential                            delayed complications were discussed with the                            patient. Return to normal activities tomorrow.                            Written discharge instructions were provided to the                            patient.                           - Resume previous diet today.                           - Continue present medications.                           - FDgard 1 cap po tid.                            - See the other procedure note for documentation of                            additional recommendations. Procedure Code(s):        --- Professional ---                           [phone], Esophagogastroduodenoscopy, flexible,                            transoral; diagnostic, including collection of                            specimen(s) by brushing or washing, when performed                            (separate procedure) Diagnosis Code(s):        --- Professional ---  R10.13, Epigastric pain                           K92.1, Melena (includes Hematochezia)                           R93.3, Abnormal findings on diagnostic imaging of                            other parts of digestive tract CPT copyright 2019 [employer]. All rights reserved. The codes documented in this report are preliminary and upon coder review may  be revised to meet current compliance requirements. Hildred Laser, MD Hildred Laser, MD 08/02/2019 8:15:12 AM This report has been signed electronically. Number of Addenda: 0

## 2019-08-02 NOTE — Anesthesia Preprocedure Evaluation (Addendum)
Anesthesia Evaluation  Patient identified by MRN, date of birth, ID band Patient awake    Reviewed: Allergy & Precautions, NPO status , Patient's Chart, lab work & pertinent test results  Airway Mallampati: III  TM Distance: >3 FB Neck ROM: Full    Dental  (+) Partial Upper, Missing, Dental Advisory Given,    Pulmonary asthma , pneumonia, Current SmokerPatient did not abstain from smoking.,    Pulmonary exam normal breath sounds clear to auscultation       Cardiovascular negative cardio ROS Normal cardiovascular exam Rhythm:Regular Rate:Normal     Neuro/Psych  Headaches, Seizures -,  PSYCHIATRIC DISORDERS Anxiety Depression    GI/Hepatic GERD  Medicated and Controlled,  Endo/Other    Renal/GU      Musculoskeletal  (+) Arthritis  (Back pain), Osteoarthritis,    Abdominal   Peds  Hematology   Anesthesia Other Findings   Reproductive/Obstetrics                            Anesthesia Physical Anesthesia Plan  ASA: III  Anesthesia Plan: General   Post-op Pain Management:    Induction:   PONV Risk Score and Plan:   Airway Management Planned: Nasal Cannula, Simple Face Mask and Natural Airway  Additional Equipment:   Intra-op Plan:   Post-operative Plan:   Informed Consent: I have reviewed the patients History and Physical, chart, labs and discussed the procedure including the risks, benefits and alternatives for the proposed anesthesia with the patient or authorized representative who has indicated his/her understanding and acceptance.     Dental advisory given  Plan Discussed with: CRNA  Anesthesia Plan Comments:         Anesthesia Quick Evaluation

## 2019-08-02 NOTE — Anesthesia Postprocedure Evaluation (Signed)
Anesthesia Post Note  Patient: Julie [name]  Procedure(s) Performed: COLONOSCOPY WITH PROPOFOL (N/A ) ESOPHAGOGASTRODUODENOSCOPY (EGD) WITH PROPOFOL (N/A ) POLYPECTOMY  Patient location during evaluation: PACU Anesthesia Type: General Level of consciousness: awake and alert and oriented Pain management: pain level controlled Vital Signs Assessment: post-procedure vital signs reviewed and stable Respiratory status: spontaneous breathing Cardiovascular status: stable Postop Assessment: no apparent nausea or vomiting Anesthetic complications: no     Last Vitals:  Vitals:   08/02/19 0645 08/02/19 0700  BP: 109/69 101/68  Resp: 15   Temp: 36.7 C   SpO2: 96%     Last Pain:  Vitals:   08/02/19 0645  TempSrc: Esophageal  PainSc: 4                  Adeliz Tonkinson

## 2019-08-02 NOTE — Discharge Instructions (Signed)
No aspirin or NSAIDs for 24 hours. Resume other medications as before. Resume usual diet. FDgard 1 capsule or tablet after each meal(over-the-counter medication). No driving for 24 hours. Physician will call with biopsy results.   Hemorrhoids Hemorrhoids are swollen veins that may develop:  In the butt (rectum). These are called internal hemorrhoids.  Around the opening of the butt (anus). These are called external hemorrhoids. Hemorrhoids can cause pain, itching, or bleeding. Most of the time, they do not cause serious problems. They usually get better with diet changes, lifestyle changes, and other home treatments. What are the causes? This condition may be caused by:  Having trouble pooping (constipation).  Pushing hard (straining) to poop.  Watery poop (diarrhea).  Pregnancy.  Being very overweight (obese).  Sitting for long periods of time.  Heavy lifting or other activity that causes you to strain.  Anal sex.  Riding a bike for a long period of time. What are the signs or symptoms? Symptoms of this condition include:  Pain.  Itching or soreness in the butt.  Bleeding from the butt.  Leaking poop.  Swelling in the area.  One or more lumps around the opening of your butt. How is this diagnosed? A doctor can often diagnose this condition by looking at the affected area. The doctor may also:  Do an exam that involves feeling the area with a gloved hand (digital rectal exam).  Examine the area inside your butt using a small tube (anoscope).  Order blood tests. This may be done if you have lost a lot of blood.  Have you get a test that involves looking inside the colon using a flexible tube with a camera on the end (sigmoidoscopy or colonoscopy). How is this treated? This condition can usually be treated at home. Your doctor may tell you to change what you eat, make lifestyle changes, or try home treatments. If these do not help, procedures can be done to  remove the hemorrhoids or make them smaller. These may involve:  Placing rubber bands at the base of the hemorrhoids to cut off their blood supply.  Injecting medicine into the hemorrhoids to shrink them.  Shining a type of light energy onto the hemorrhoids to cause them to fall off.  Doing surgery to remove the hemorrhoids or cut off their blood supply. Follow these instructions at home: Eating and drinking   Eat foods that have a lot of fiber in them. These include whole grains, beans, nuts, fruits, and vegetables.  Ask your doctor about taking products that have added fiber (fibersupplements).  Reduce the amount of fat in your diet. You can do this by: ? Eating low-fat dairy products. ? Eating less red meat. ? Avoiding processed foods.  Drink enough fluid to keep your pee (urine) pale yellow. Managing pain and swelling   Take a warm-water bath (sitz bath) for 20 minutes to ease pain. Do this 3-4 times a day. You may do this in a bathtub or using a portable sitz bath that fits over the toilet.  If told, put ice on the painful area. It may be helpful to use ice between your warm baths. ? Put ice in a plastic bag. ? Place a towel between your skin and the bag. ? Leave the ice on for 20 minutes, 2-3 times a day. General instructions  Take over-the-counter and prescription medicines only as told by your doctor. ? Medicated creams and medicines may be used as told.  Exercise often. Ask your doctor  how much and what kind of exercise is best for you.  Go to the bathroom when you have the urge to poop. Do not wait.  Avoid pushing too hard when you poop.  Keep your butt dry and clean. Use wet toilet paper or moist towelettes after pooping.  Do not sit on the toilet for a long time.  Keep all follow-up visits as told by your doctor. This is important. Contact a doctor if you:  Have pain and swelling that do not get better with treatment or medicine.  Have trouble  pooping.  Cannot poop.  Have pain or swelling outside the area of the hemorrhoids. Get help right away if you have:  Bleeding that will not stop. Summary  Hemorrhoids are swollen veins in the butt or around the opening of the butt.  They can cause pain, itching, or bleeding.  Eat foods that have a lot of fiber in them. These include whole grains, beans, nuts, fruits, and vegetables.  Take a warm-water bath (sitz bath) for 20 minutes to ease pain. Do this 3-4 times a day. This information is not intended to replace advice given to you by your health care provider. Make sure you discuss any questions you have with your health care provider. Document Released: 08/30/2008 Document Revised: 11/29/2018 Document Reviewed: 04/12/2018 Elsevier Patient Education  2020 [employer] After These instructions provide you with information about caring for yourself after your procedure. Your health care provider may also give you more specific instructions. Your treatment has been planned according to current medical practices, but problems sometimes occur. Call your health care provider if you have any problems or questions after your procedure. What can I expect after the procedure? After your procedure, you may:  Feel sleepy for several hours.  Feel clumsy and have poor balance for several hours.  Feel forgetful about what happened after the procedure.  Have poor judgment for several hours.  Feel nauseous or vomit.  Have a sore throat if you had a breathing tube during the procedure. Follow these instructions at home: For at least 24 hours after the procedure:      Have a responsible adult stay with you. It is important to have someone help care for you until you are awake and alert.  Rest as needed.  Do not: ? Participate in activities in which you could fall or become injured. ? Drive. ? Use heavy machinery. ? Drink alcohol. ? Take sleeping  pills or medicines that cause drowsiness. ? Make important decisions or sign legal documents. ? Take care of children on your own. Eating and drinking  Follow the diet that is recommended by your health care provider.  If you vomit, drink water, juice, or soup when you can drink without vomiting.  Make sure you have little or no nausea before eating solid foods. General instructions  Take over-the-counter and prescription medicines only as told by your health care provider.  If you have sleep apnea, surgery and certain medicines can increase your risk for breathing problems. Follow instructions from your health care provider about wearing your sleep device: ? Anytime you are sleeping, including during daytime naps. ? While taking prescription pain medicines, sleeping medicines, or medicines that make you drowsy.  If you smoke, do not smoke without supervision.  Keep all follow-up visits as told by your health care provider. This is important. Contact a health care provider if:  You keep feeling nauseous or you keep vomiting.  You feel light-headed.  You develop a rash.  You have a fever. Get help right away if:  You have trouble breathing. Summary  For several hours after your procedure, you may feel sleepy and have poor judgment.  Have a responsible adult stay with you for at least 24 hours or until you are awake and alert. This information is not intended to replace advice given to you by your health care provider. Make sure you discuss any questions you have with your health care provider. Document Released: 03/13/2016 Document Revised: 02/19/2018 Document Reviewed: 03/13/2016 Elsevier Patient Education  [place].  Colon Polyps  Polyps are tissue growths inside the body. Polyps can grow in many places, including the large intestine (colon). A polyp may be a round bump or a mushroom-shaped growth. You could have one polyp or several. Most colon polyps are  noncancerous (benign). However, some colon polyps can become cancerous over time. Finding and removing the polyps early can help prevent this. What are the causes? The exact cause of colon polyps is not known. What increases the risk? You are more likely to develop this condition if you:  Have a family history of colon cancer or colon polyps.  Are older than 32 or older than 45 if you are African American.  Have inflammatory bowel disease, such as ulcerative colitis or Crohn's disease.  Have certain hereditary conditions, such as: ? Familial adenomatous polyposis. ? Lynch syndrome. ? Turcot syndrome. ? Peutz-Jeghers syndrome.  Are overweight.  Smoke cigarettes.  Do not get enough exercise.  Drink too much alcohol.  Eat a diet that is high in fat and red meat and low in fiber.  Had childhood cancer that was treated with abdominal radiation. What are the signs or symptoms? Most polyps do not cause symptoms. If you have symptoms, they may include:  Blood coming from your rectum when having a bowel movement.  Blood in your stool. The stool may look dark red or black.  Abdominal pain.  A change in bowel habits, such as constipation or diarrhea. How is this diagnosed? This condition is diagnosed with a colonoscopy. This is a procedure in which a lighted, flexible scope is inserted into the anus and then passed into the colon to examine the area. Polyps are sometimes found when a colonoscopy is done as part of routine cancer screening tests. How is this treated? Treatment for this condition involves removing any polyps that are found. Most polyps can be removed during a colonoscopy. Those polyps will then be tested for cancer. Additional treatment may be needed depending on the results of testing. Follow these instructions at home: Lifestyle  Maintain a healthy weight, or lose weight if recommended by your health care provider.  Exercise every day or as told by your health  care provider.  Do not use any products that contain nicotine or tobacco, such as cigarettes and e-cigarettes. If you need help quitting, ask your health care provider.  If you drink alcohol, limit how much you have: ? 0-1 drink a day for women. ? 0-2 drinks a day for men.  Be aware of how much alcohol is in your drink. In the [place] one drink equals one 12 oz bottle of beer (355 mL), one 5 oz glass of wine (148 mL), or one 1 oz shot of hard liquor (44 mL). Eating and drinking   Eat foods that are high in fiber, such as fruits, vegetables, and whole grains.  Eat foods that are high  in calcium and vitamin D, such as milk, cheese, yogurt, eggs, liver, fish, and broccoli.  Limit foods that are high in fat, such as fried foods and desserts.  Limit the amount of red meat and processed meat you eat, such as hot dogs, sausage, bacon, and lunch meats. General instructions  Keep all follow-up visits as told by your health care provider. This is important. ? This includes having regularly scheduled colonoscopies. ? Talk to your health care provider about when you need a colonoscopy. Contact a health care provider if:  You have new or worsening bleeding during a bowel movement.  You have new or increased blood in your stool.  You have a change in bowel habits.  You lose weight for no known reason. Summary  Polyps are tissue growths inside the body. Polyps can grow in many places, including the colon.  Most colon polyps are noncancerous (benign), but some can become cancerous over time.  This condition is diagnosed with a colonoscopy.  Treatment for this condition involves removing any polyps that are found. Most polyps can be removed during a colonoscopy. This information is not intended to replace advice given to you by your health care provider. Make sure you discuss any questions you have with your health care provider. Document Released: 08/17/2004 Document Revised: 03/08/2018  Document Reviewed: 03/08/2018 Elsevier Patient Education  2020 [facility], Adult, Care After This sheet gives you information about how to care for yourself after your procedure. Your health care provider may also give you more specific instructions. If you have problems or questions, contact your health care provider. What can I expect after the procedure? After the procedure, it is common to have:  A sore throat.  Mild stomach pain or discomfort.  Bloating.  Nausea. Follow these instructions at home:   Follow instructions from your health care provider about what to eat or drink after your procedure.  Return to your normal activities as told by your health care provider. Ask your health care provider what activities are safe for you.  Take over-the-counter and prescription medicines only as told by your health care provider.  Do not drive for 24 hours if you were given a sedative during your procedure.  Keep all follow-up visits as told by your health care provider. This is important. Contact a health care provider if you have:  A sore throat that lasts longer than one day.  Trouble swallowing. Get help right away if:  You vomit blood or your vomit looks like coffee grounds.  You have: ? A fever. ? Bloody, black, or tarry stools. ? A severe sore throat or you cannot swallow. ? Difficulty breathing. ? Severe pain in your chest or abdomen. Summary  After the procedure, it is common to have a sore throat, mild stomach discomfort, bloating, and nausea.  Do not drive for 24 hours if you were given a sedative during the procedure.  Follow instructions from your health care provider about what to eat or drink after your procedure.  Return to your normal activities as told by your health care provider. This information is not intended to replace advice given to you by your health care provider. Make sure you discuss any questions you have with your  health care provider. Document Released: 05/22/2012 Document Revised: 05/15/2018 Document Reviewed: 04/23/2018 Elsevier Patient Education  2020 [employer].  Colonoscopy, Adult, Care After This sheet gives you information about how to care for yourself after your procedure. Your doctor  may also give you more specific instructions. If you have problems or questions, call your doctor. What can I expect after the procedure? After the procedure, it is common to have:  A small amount of blood in your poop for 24 hours.  Some gas.  Mild cramping or bloating in your belly. Follow these instructions at home: General instructions  For the first 24 hours after the procedure: ? Do not drive or use machinery. ? Do not sign important documents. ? Do not drink alcohol. ? Do your daily activities more slowly than normal. ? Eat foods that are soft and easy to digest.  Take over-the-counter or prescription medicines only as told by your doctor. To help cramping and bloating:   Try walking around.  Put heat on your belly (abdomen) as told by your doctor. Use a heat source that your doctor recommends, such as a moist heat pack or a heating pad. ? Put a towel between your skin and the heat source. ? Leave the heat on for 20-30 minutes. ? Remove the heat if your skin turns bright red. This is especially important if you cannot feel pain, heat, or cold. You can get burned. Eating and drinking   Drink enough fluid to keep your pee (urine) clear or pale yellow.  Return to your normal diet as told by your doctor. Avoid heavy or fried foods that are hard to digest.  Avoid drinking alcohol for as long as told by your doctor. Contact a doctor if:  You have blood in your poop (stool) 2-3 days after the procedure. Get help right away if:  You have more than a small amount of blood in your poop.  You see large clumps of tissue (blood clots) in your poop.  Your belly is swollen.  You feel sick  to your stomach (nauseous).  You throw up (vomit).  You have a fever.  You have belly pain that gets worse, and medicine does not help your pain. Summary  After the procedure, it is common to have a small amount of blood in your poop. You may also have mild cramping and bloating in your belly.  For the first 24 hours after the procedure, do not drive or use machinery, do not sign important documents, and do not drink alcohol.  Get help right away if you have a lot of blood in your poop, feel sick to your stomach, have a fever, or have more belly pain. This information is not intended to replace advice given to you by your health care provider. Make sure you discuss any questions you have with your health care provider. Document Released: 12/24/2010 Document Revised: 09/21/2017 Document Reviewed: 08/15/2016 Elsevier Patient Education  2020 [employer].

## 2019-08-05 ENCOUNTER — Other Ambulatory Visit: Payer: Self-pay

## 2019-08-05 ENCOUNTER — Ambulatory Visit (INDEPENDENT_AMBULATORY_CARE_PROVIDER_SITE_OTHER): Payer: Medicaid Other | Admitting: Licensed Clinical Social Worker

## 2019-08-05 DIAGNOSIS — F320 Major depressive disorder, single episode, mild: Secondary | ICD-10-CM | POA: Diagnosis not present

## 2019-08-06 ENCOUNTER — Encounter (HOSPITAL_COMMUNITY): Payer: Self-pay | Admitting: Internal Medicine

## 2019-08-06 ENCOUNTER — Telehealth (INDEPENDENT_AMBULATORY_CARE_PROVIDER_SITE_OTHER): Payer: Self-pay | Admitting: Internal Medicine

## 2019-08-06 NOTE — Telephone Encounter (Signed)
Patient left message stating after her procedure she thought Dr Laural Golden stated he was going to prescribe her some type of medication - would like a call back at [phone]

## 2019-08-06 NOTE — Progress Notes (Signed)
Virtual Visit via Video Note  I connected with Julie [name] on 08/06/19 at  1:00 PM EDT by a video enabled telemedicine application and verified that I am speaking with the correct person using two identifiers.  Location: Patient: Home  Provider: Office   I discussed the limitations of evaluation and management by telemedicine and the availability of in person appointments. The patient expressed understanding and agreed to proceed.   THERAPIST PROGRESS NOTE  Session Time: 1:00 pm-1:40 pm  Participation Level: Active  Behavioral Response: CasualAlertAnxious  Type of Therapy: Individual Therapy  Treatment Goals addressed: Coping  Interventions: CBT and Solution Focused  Summary: Julie [name] is a 48 y.o. female who presents oriented x5 (person, place, situation, time, and object), casually dressed, appropriately groomed, average height, overweight, and cooperative to address mood. Patient has a history of medical treatment including arthritis and menopause. Patient has a history of mental health treatment including outpatient therapy and medication management. Patient denies suicidal and homicidal ideations. Patient denies psychosis including auditory and visual hallucinations. Patient denies substance abuse. Patient is at low risk for lethality.  Physically: Patient has limited appetite due to feeling sick on her stomach. Patient feels drained and has no energy. Patient started menopause and feels like this could be a factor. Spiritually/values: No issues identified.  Relationships: Patient is close to her parents. She worries about her father's health. Patient's relationship with her fiance fluctuates. She feels like they are close and then they feel distant.  Emotionally/mentally/behavior: Patient feels fatigued which causes her to not doing daily tasks. She feels lonely and depressed. Patient is more quiet and isolated right now. Patient understands she needs to get out of the  home and see friends.   Patient engaged in session. She responded well to interventions. 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 Patient will continue in outpatient therapy due to being the least restrictive service to meet her needs at this time. Patient made no progress on her goals at this time.    Suicidal/Homicidal: Negativewithout intent/plan  Therapist Response: Therapist reviewed patient's recent thoughts and behaviors. Therapist utilized CBT to address anxiety and depression. Therapist processed patient's thoughts to identify triggers for mood and anxiety. Therapist assisted patient in completing treatment plan. Therapist educated patient on CBT.    Plan: Return again in 3 weeks.  Diagnosis: Axis I: Current moderate episode of major depressive disorder without prior episode    Axis II: No diagnosis   I discussed the assessment and treatment plan with the patient. The patient was provided an opportunity to ask questions and all were answered. The patient agreed with the plan and demonstrated an understanding of the instructions.   The patient was advised to call back or seek an in-person evaluation if the symptoms worsen or if the condition fails to improve as anticipated.  I provided 45 minutes of non-face-to-face time during this encounter.   Glori Bickers, LCSW 08/06/2019

## 2019-08-07 NOTE — Telephone Encounter (Signed)
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  Results given to patient.  Next colonoscopy in 7 years.  Patient advised to try FDgard for 2 weeks or so and call with progress report.  Report to PCP.  This has been sent to PCP.

## 2019-08-07 NOTE — Telephone Encounter (Signed)
Patient was called and made aware that no mention of a prescription was seen. Patient states that she is not having any issues with her throat , stomach or bowels. To address with Dr.Rehman.

## 2019-08-19 ENCOUNTER — Telehealth (HOSPITAL_COMMUNITY): Payer: Self-pay | Admitting: *Deleted

## 2019-08-19 NOTE — Telephone Encounter (Signed)
PATIENT CALLED STATED THAT SHE IS SKIPPING TAKING THE QUEtiapine (SEROQUEL) 50 MG tablet  DUE TO CAUSING INCREASE IN WEIGHT GAIN WENT TO PCP  & HAS GAINED 7/8 LBS IN 2 MONTHS & CAUSING HER TO EAT @ LATE NIGHT & EATING SWEETS LIKE CRAZY. WAS HAVING A PANIC ATTACK OR ANXIETY  & TOOK 1/2 TAB & SHE FELT BETTER & THE UNCONTROLLABLE SHAKING STOPPED. WOULD LIKE TO TRY SOMETHING DIFFERENT

## 2019-08-19 NOTE — Telephone Encounter (Signed)
SPOKE WITH PATIENT  &  She wants to try the Abilify. She's interested

## 2019-08-19 NOTE — Telephone Encounter (Signed)
She can try Abilify if interested. It can still cause side effect of increasing in appetite (although it would be less risk compared to quetiapine), and restlessness.

## 2019-08-19 NOTE — Telephone Encounter (Signed)
ATTEMPTED CALL BACK TO PATIENT AFTER SPEAKING WITH FRONT OFFICE OF MED CAUSING HER TO HAVE SHAKES. NO ANSWER LVM FOR CALL BACK

## 2019-08-20 ENCOUNTER — Telehealth (HOSPITAL_COMMUNITY): Payer: Self-pay

## 2019-08-20 ENCOUNTER — Other Ambulatory Visit (HOSPITAL_COMMUNITY): Payer: Self-pay | Admitting: Psychiatry

## 2019-08-20 MED ORDER — ARIPIPRAZOLE 2 MG PO TABS: 2.0000 mg | ORAL_TABLET | Freq: Every day | ORAL | 1 refills | Status: DC

## 2019-08-20 NOTE — Telephone Encounter (Signed)
LVM TO INFORM PER PROVIDER: Ordered Abilify 2 mg. Advise her to take it at night. However, if the medication makes her awake, she can take it in the morning (once a day).

## 2019-08-20 NOTE — Telephone Encounter (Signed)
Medication management - message left for patient that Dr. Modesta Messing did send in [place] 2 mg to take once a day at bedtime per her instruction and also that if the medication started affecting her sleep she could change it to the mornings.  Informed patient the medication should be at her [facility] and to call our office back if any questions or problems with the medication once she starts taking it.

## 2019-08-20 NOTE — Telephone Encounter (Signed)
Medication management - Called [employer] [place] [employer] for prior authorization approval for pt's newly prescribed Abilify.  Medication approved with PA# [id number] until 08/14/20.  Called pt's [place] to inform so medication could be filled.

## 2019-08-20 NOTE — Telephone Encounter (Signed)
Ordered Abilify 2 mg. Advise her to take it at night. However, if the medication makes her awake, she can take it in the morning (once a day).

## 2019-08-28 ENCOUNTER — Other Ambulatory Visit: Payer: Self-pay

## 2019-08-28 ENCOUNTER — Ambulatory Visit (HOSPITAL_COMMUNITY): Payer: Medicaid Other | Admitting: Licensed Clinical Social Worker

## 2019-08-30 ENCOUNTER — Telehealth (HOSPITAL_COMMUNITY): Payer: Self-pay | Admitting: Licensed Clinical Social Worker

## 2019-08-30 NOTE — Telephone Encounter (Signed)
I sent a text to connect with patient at 3pm for video session. She did not respond and visit was counted as a no show.
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
## 2019-09-19 ENCOUNTER — Encounter (HOSPITAL_COMMUNITY): Payer: Self-pay | Admitting: Psychiatry

## 2019-09-19 ENCOUNTER — Other Ambulatory Visit: Payer: Self-pay

## 2019-09-19 ENCOUNTER — Ambulatory Visit (INDEPENDENT_AMBULATORY_CARE_PROVIDER_SITE_OTHER): Payer: Medicaid Other | Admitting: Psychiatry

## 2019-09-19 DIAGNOSIS — F4310 Post-traumatic stress disorder, unspecified: Secondary | ICD-10-CM | POA: Diagnosis not present

## 2019-09-19 DIAGNOSIS — F320 Major depressive disorder, single episode, mild: Secondary | ICD-10-CM | POA: Diagnosis not present

## 2019-09-19 MED ORDER — TRAZODONE HCL 100 MG PO TABS: 100.0000 mg | ORAL_TABLET | Freq: Every day | ORAL | 0 refills | Status: DC

## 2019-09-19 MED ORDER — LORAZEPAM 1 MG PO TABS: 1.0000 mg | ORAL_TABLET | Freq: Two times a day (BID) | ORAL | 3 refills | Status: DC | PRN

## 2019-09-19 MED ORDER — DULOXETINE HCL 60 MG PO CPEP: 60.0000 mg | ORAL_CAPSULE | Freq: Every day | ORAL | 0 refills | Status: DC

## 2019-09-19 MED ORDER — ARIPIPRAZOLE 2 MG PO TABS: 2.0000 mg | ORAL_TABLET | Freq: Every day | ORAL | 0 refills | Status: DC

## 2019-09-19 NOTE — Patient Instructions (Signed)
1.Continue duloxetine60 mg daily  2. Continue Abilify 2 mg daily  3. Continue lorazepam 1 mg twice a day as needed for anxiety  4.Increase Trazodone 100 mg at night as needed for sleep 5. Next appointment: In January

## 2019-09-24 ENCOUNTER — Ambulatory Visit (HOSPITAL_COMMUNITY): Payer: Medicaid Other | Admitting: Psychiatry

## 2019-10-23 ENCOUNTER — Other Ambulatory Visit: Payer: Self-pay

## 2019-10-23 ENCOUNTER — Encounter (HOSPITAL_COMMUNITY): Payer: Self-pay | Admitting: Licensed Clinical Social Worker

## 2019-10-23 ENCOUNTER — Ambulatory Visit (INDEPENDENT_AMBULATORY_CARE_PROVIDER_SITE_OTHER): Payer: Medicaid Other | Admitting: Licensed Clinical Social Worker

## 2019-10-23 DIAGNOSIS — F320 Major depressive disorder, single episode, mild: Secondary | ICD-10-CM | POA: Diagnosis not present

## 2019-10-23 NOTE — Progress Notes (Signed)
Virtual Visit via Video Note  I connected with Julie [name] on 10/23/19 at  3:00 PM EST by a video enabled telemedicine application and verified that I am speaking with the correct person using two identifiers.  Location: Patient: Home  Provider: Office   I discussed the limitations of evaluation and management by telemedicine and the availability of in person appointments. The patient expressed understanding and agreed to proceed.   THERAPIST PROGRESS NOTE  Session Time: 3:00 pm-3:45 pm  Participation Level: Active  Behavioral Response: CasualAlertAnxious  Type of Therapy: Individual Therapy  Treatment Goals addressed: Coping  Interventions: CBT and Solution Focused  Summary: Julie [name] is a 48 y.o. female who presents oriented x5 (person, place, situation, time, and object), casually dressed, appropriately groomed, average height, overweight, and cooperative to address mood. Patient has a history of medical treatment including arthritis and menopause. Patient has a history of mental health treatment including outpatient therapy and medication management. Patient denies suicidal and homicidal ideations. Patient denies psychosis including auditory and visual hallucinations. Patient denies substance abuse. Patient is at low risk for lethality.  Physically: Patient has joined a gym but has not gone. Patient was dressed in her exercise clothes and had planned to go to the gym prior to session. Patient agreed to go to the gym after session.  Spiritually/values: No issues identified.  Relationships: Patient feels like she is not getting her needs met physically or emotionally by her fiance. Patient feels like the only time she gets attention from her fiance is when he wants to have sex. Patient feels like their relationship fluctuates where she feels like she gets no attention to getting attention for sex.  Emotionally/mentally/behavior: Patient has been feeling anxious. She feels it  is hard for her to leave the house but does it anyway. Patient uses deep breathing, talking to others, and "leaning into" her triggers instead of avoid them. Patient also admits to feeling overwhelmed. Patient agreed to break down tasks around the house, etc to make them more manageable.   Patient engaged in session. She responded well to interventions. 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 Patient will continue in outpatient therapy due to being the least restrictive service to meet her needs at this time. Patient made minimal progress on her goals at this time.    Suicidal/Homicidal: Negativewithout intent/plan  Therapist Response: Therapist reviewed patient's recent thoughts and behaviors. Therapist utilized CBT to address anxiety and depression. Therapist processed patient's thoughts to identify triggers for mood and anxiety. Therapist had patient identify what has gone well and discussed patient's relationships.  Plan: Return again in 3 weeks.  Diagnosis: Axis I: Current moderate episode of major depressive disorder without prior episode    Axis II: No diagnosis   I discussed the assessment and treatment plan with the patient. The patient was provided an opportunity to ask questions and all were answered. The patient agreed with the plan and demonstrated an understanding of the instructions.   The patient was advised to call back or seek an in-person evaluation if the symptoms worsen or if the condition fails to improve as anticipated.  I provided 45 minutes of non-face-to-face time during this encounter.   Glori Bickers, LCSW 10/23/2019

## 2019-11-04 ENCOUNTER — Ambulatory Visit: Payer: Self-pay | Admitting: Adult Health

## 2019-11-08 ENCOUNTER — Ambulatory Visit: Payer: Self-pay | Admitting: Adult Health

## 2019-12-10 ENCOUNTER — Ambulatory Visit (INDEPENDENT_AMBULATORY_CARE_PROVIDER_SITE_OTHER): Payer: Medicaid Other | Admitting: Licensed Clinical Social Worker

## 2019-12-10 DIAGNOSIS — F320 Major depressive disorder, single episode, mild: Secondary | ICD-10-CM
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
## 2019-12-23 ENCOUNTER — Ambulatory Visit (HOSPITAL_COMMUNITY): Payer: Medicaid Other | Admitting: Licensed Clinical Social Worker

## 2019-12-25 ENCOUNTER — Ambulatory Visit (INDEPENDENT_AMBULATORY_CARE_PROVIDER_SITE_OTHER): Payer: Medicaid Other | Admitting: Adult Health

## 2019-12-25 ENCOUNTER — Encounter: Payer: Self-pay | Admitting: Adult Health

## 2019-12-25 ENCOUNTER — Other Ambulatory Visit: Payer: Self-pay

## 2019-12-25 VITALS — BP 111/62 | HR 100 | Ht 60.0 in | Wt 133.0 lb

## 2019-12-25 DIAGNOSIS — N951 Menopausal and female climacteric states: Secondary | ICD-10-CM

## 2019-12-25 DIAGNOSIS — R6882 Decreased libido: Secondary | ICD-10-CM | POA: Diagnosis not present

## 2019-12-25 DIAGNOSIS — R232 Flushing: Secondary | ICD-10-CM

## 2019-12-25 DIAGNOSIS — N898 Other specified noninflammatory disorders of vagina: Secondary | ICD-10-CM | POA: Insufficient documentation

## 2019-12-25 DIAGNOSIS — R102 Pelvic and perineal pain: Secondary | ICD-10-CM | POA: Diagnosis not present

## 2019-12-25 NOTE — Progress Notes (Signed)
  Subjective:     Patient ID: Julie [name], female   DOB: 07-07-1971, 49 y.o.   MRN: [id number]  HPI Margaux is a 49 year old white female, single, [id number] in complaining of pelvic pain and hot flashes, decreased libidio and vaginal dryness, no period in 9-10 months PCP is [facility].   Review of Systems +pelvic pain +hot flashes +decrased libido +nop period in 9-10 months  +vaginal dryness   Reviewed past medical,surgical, social and family history. Reviewed medications and allergies.     Objective:   Physical Exam BP 111/62 (BP Location: Left Arm, Patient Position: Sitting, Cuff Size: Normal)   Pulse 100   Ht 5' (1.524 m)   Wt 133 lb (60.3 kg)   LMP  (LMP Unknown)   BMI 25.97 kg/m   Skin warm and dry.Pelvic: external genitalia is normal in appearance no lesions, vagina: scant white discharge without odor,urethra has no lesions or masses noted, cervix:smooth and bulbous, uterus: normal size, shape and contour, mildly tender, no masses felt, adnexa: no masses, LLQ tenderness noted. Bladder is non tender and no masses felt.  Examination chaperoned by Estill Bamberg Rash LPN.     Assessment:     1. Pelvic pain Return 12/30/19 for GYN [place]  2. Hot flashes  3. Decreased libido  4. Vaginal dryness   5. Perimenopause   Discussed she is nearing menopause and if [place] normal, may try HRT Plan:     Return in about 2 weeks for pap and physical and ROS

## 2019-12-26 ENCOUNTER — Other Ambulatory Visit: Payer: Self-pay | Admitting: Adult Health

## 2019-12-26 DIAGNOSIS — R1032 Left lower quadrant pain: Secondary | ICD-10-CM

## 2019-12-30 ENCOUNTER — Ambulatory Visit (INDEPENDENT_AMBULATORY_CARE_PROVIDER_SITE_OTHER): Payer: Medicaid Other

## 2019-12-30 ENCOUNTER — Other Ambulatory Visit: Payer: Self-pay

## 2019-12-30 DIAGNOSIS — R1032 Left lower quadrant pain: Secondary | ICD-10-CM | POA: Diagnosis not present

## 2019-12-30 NOTE — Progress Notes (Signed)
PELVIC US TA/TV: heterogeneous axial uterus,posterior submucosal fibroid N/C 1.2 x 1.1 x 1.3 cm,EEC 1.3 mm,normal ovaries,ovaries appear mobile,no free fluid, bilat adnexal discomfort

## 2020-01-06 ENCOUNTER — Other Ambulatory Visit (HOSPITAL_COMMUNITY)
Admission: RE | Admit: 2020-01-06 | Discharge: 2020-01-06 | Disposition: A | Discharge status: Home / Self Care | Payer: Medicaid Other | Source: Ambulatory Visit | Attending: Adult Health | Admitting: Adult Health

## 2020-01-06 ENCOUNTER — Ambulatory Visit (INDEPENDENT_AMBULATORY_CARE_PROVIDER_SITE_OTHER): Payer: Medicaid Other | Admitting: Adult Health

## 2020-01-06 ENCOUNTER — Other Ambulatory Visit: Payer: Self-pay

## 2020-01-06 ENCOUNTER — Encounter: Payer: Self-pay | Admitting: Adult Health

## 2020-01-06 VITALS — BP 115/71 | HR 90 | Ht 60.0 in | Wt 134.6 lb

## 2020-01-06 DIAGNOSIS — Z1211 Encounter for screening for malignant neoplasm of colon: Secondary | ICD-10-CM

## 2020-01-06 DIAGNOSIS — Z01419 Encounter for gynecological examination (general) (routine) without abnormal findings: Secondary | ICD-10-CM | POA: Diagnosis not present

## 2020-01-06 DIAGNOSIS — Z0000 Encounter for general adult medical examination without abnormal findings: Secondary | ICD-10-CM

## 2020-01-06 DIAGNOSIS — R102 Pelvic and perineal pain unspecified side: Secondary | ICD-10-CM

## 2020-01-06 DIAGNOSIS — Z1212 Encounter for screening for malignant neoplasm of rectum: Secondary | ICD-10-CM | POA: Diagnosis not present

## 2020-01-06 DIAGNOSIS — D229 Melanocytic nevi, unspecified: Secondary | ICD-10-CM

## 2020-01-06 DIAGNOSIS — Z79890 Hormone replacement therapy (postmenopausal): Secondary | ICD-10-CM

## 2020-01-06 DIAGNOSIS — N951 Menopausal and female climacteric states: Secondary | ICD-10-CM

## 2020-01-06 DIAGNOSIS — N898 Other specified noninflammatory disorders of vagina: Secondary | ICD-10-CM

## 2020-01-06 DIAGNOSIS — N926 Irregular menstruation, unspecified: Secondary | ICD-10-CM

## 2020-01-06 DIAGNOSIS — R6882 Decreased libido: Secondary | ICD-10-CM

## 2020-01-06 DIAGNOSIS — R232 Flushing: Secondary | ICD-10-CM

## 2020-01-06 LAB — HEMOCCULT GUIAC POC 1CARD (OFFICE): Fecal Occult Blood, POC: NEGATIVE

## 2020-01-06 MED ORDER — CONJ ESTROG-MEDROXYPROGEST ACE 0.625-5 MG PO TABS: 1.0000 | ORAL_TABLET | Freq: Every day | ORAL | 3 refills | Status: DC

## 2020-01-06 NOTE — Progress Notes (Signed)
Patient ID: Julie [name], female   DOB: [birth year], 49 y.o.   MRN: [id number] History of Present Illness: Julie [name] is a 49 year old white female,single, [id number] in for a well woman gyn exam and pap.  PCP is [facility].    Current Medications, Allergies, Past Medical History, Past Surgical History, Family History and Social History were reviewed in [employer] record.     Review of Systems: Patient denies any headaches, hearing loss, fatigue, blurred vision, shortness of breath, chest pain, , problems with bowel movements, urination, or intercourse. No joint pain or mood swings. +hot flashes +vaginal dryness  +decrased libido No period in about 10 months  Pain in left side goes and comes   Physical Exam:BP 115/71 (BP Location: Left Arm, Patient Position: Sitting, Cuff Size: Normal)   Pulse 90   Ht 5' (1.524 m)   Wt 134 lb 9.6 oz (61.1 kg)   LMP  (LMP Unknown)   BMI 26.29 kg/m  General:  Well developed, well nourished, no acute distress Skin:  Warm and dry Neck:  Midline trachea, normal thyroid, good ROM, no lymphadenopathy Lungs; Clear to auscultation bilaterally Breast:  No dominant palpable mass, retraction, or nipple discharge, has bilateral implants and has tender lumps under both axilla, R>L, has been there and was supposed to see dermatology per has not had referral she says . Has AK vs nevi under right breast and 2 moles left back in bra line that are brown and bigger that pencil eraser.  Cardiovascular: Regular rate and rhythm Abdomen:  Soft, non tender, no hepatosplenomegaly Pelvic:  External genitalia is normal in appearance, no lesions.  The vagina is normal in appearance. Urethra has no lesions or masses. The cervix is bulbous,pap with GC/CHL and high risk HPV 16/18 performed.  Uterus is felt to be normal size, shape, and contour.  No adnexal masses, LLQ tenderness noted, over bowel.Bladder is non tender, no masses felt. Rectal: Good  sphincter tone, no polyps, or hemorrhoids felt.  Hemoccult negative. Extremities/musculoskeletal:  No swelling or varicosities noted, no clubbing or cyanosis Psych:  No mood changes, alert and cooperative,seems happy Fall risk is low PHQ 2 score 0. Examination chaperoned by Sabrina Holland LPN.  [place], performed 624THL, showed small fibroids, normal ovaries.   Impression and Plan: 1. Encounter for gynecological examination with Papanicolaou smear of cervix Pap sent Physical in 1 year Pap in 3 if normal Labs with PCP Request  copy mammogram sent to me from [facility]  2. Screening for colorectal cancer   3. Hot flashes Discussed HRT pros and cons and she wants to try Will Rx Prempro Meds ordered this encounter  Medications  . estrogen, conjugated,-medroxyprogesterone (PREMPRO) 0.625-5 MG tablet    Sig: Take 1 tablet by mouth daily.    Dispense:  30 tablet    Refill:  3    Order Specific Question:   Supervising Provider    Answer:   Tania Ade H [[id number]]   Follow up in 3 months for ROS  4. Decreased libido Will try prempro  5. Vaginal dryness Will try prempro  6. Perimenopause  7. Missed periods  8. Pelvic pain Follow up with GI  9. Hormone replacement therapy (HRT) Rx Prempro  10. Numerous moles Needs referral to dermatologist, get PCP to refer due to [facility]

## 2020-01-09 LAB — CYTOLOGY - PAP
Chlamydia: NEGATIVE
Comment: NEGATIVE
Comment: NEGATIVE
Comment: NORMAL
Diagnosis: NEGATIVE
High risk HPV: NEGATIVE
Neisseria Gonorrhea: NEGATIVE

## 2020-01-14 ENCOUNTER — Other Ambulatory Visit: Payer: Self-pay

## 2020-01-14 ENCOUNTER — Ambulatory Visit (INDEPENDENT_AMBULATORY_CARE_PROVIDER_SITE_OTHER): Payer: Medicaid Other | Admitting: Psychiatry

## 2020-01-14 ENCOUNTER — Encounter: Payer: Self-pay | Admitting: Psychiatry

## 2020-01-14 DIAGNOSIS — F4310 Post-traumatic stress disorder, unspecified: Secondary | ICD-10-CM

## 2020-01-14 DIAGNOSIS — F3341 Major depressive disorder, recurrent, in partial remission: Secondary | ICD-10-CM

## 2020-01-14 MED ORDER — ARIPIPRAZOLE 2 MG PO TABS: 2.0000 mg | ORAL_TABLET | Freq: Every day | ORAL | 0 refills | Status: DC

## 2020-01-14 MED ORDER — DULOXETINE HCL 60 MG PO CPEP: 60.0000 mg | ORAL_CAPSULE | Freq: Every day | ORAL | 0 refills | Status: DC

## 2020-01-14 MED ORDER — LORAZEPAM 1 MG PO TABS: 1.0000 mg | ORAL_TABLET | Freq: Two times a day (BID) | ORAL | 1 refills | Status: DC | PRN

## 2020-01-14 MED ORDER — TRAZODONE HCL 150 MG PO TABS: 150.0000 mg | ORAL_TABLET | Freq: Every day | ORAL | 0 refills | Status: DC

## 2020-01-14 NOTE — Progress Notes (Signed)
[facility] MD OP Progress Note  Virtual Visit via Telephone Note  I connected with Julie [name] on 01/14/20 at  3:45 PM EST by telephone and verified that I am speaking with the correct person using two identifiers.   I discussed the limitations, risks, security and privacy concerns of performing an evaluation and management service by telephone and the availability of in person appointments. I also discussed with the patient that there may be a patient responsible charge related to this service. The patient expressed understanding and agreed to proceed.    01/14/2020 4:05 PM Julie [name]  MRN:  [id number]  Chief Complaint: " I have a lot going on."  HPI: Patient reported that she has had several different stressors.  She had a death in the family last week and as result she is not been doing too well.  She also informed that her wedding was called also that has been a very big stressor for her.  She stated that she started taking one and a 1/2 tablets of trazodone for sleep (150 mg) as the only milligrams dose was not helping enough.  Due to that she ran out of trazodone and she has not been able to sleep for the past couple of nights.  She stated that she has not been able to talk with therapist in more than a month and she would really like for that appointment to be scheduled as soon as possible.  She stated that she has all the situations going on and she knows with time things will get better.  She would like to keep the rest of her regimen same way.   Visit Diagnosis:    ICD-10-CM   1. MDD (major depressive disorder), recurrent, in partial remission ([place])  F33.41   2. PTSD (post-traumatic stress disorder)  F43.10     Past Psychiatric History: MDD, PTSD  Past Medical History:  Past Medical History:  Diagnosis Date  . Anger   . Anxiety   . Aortic insufficiency 2006   Noted at heart cath - mild to moderate  . Arthritis   . Asthma   . Back pain   . BV (bacterial vaginosis)  05/28/2015  . Chronic diarrhea   . Chronic headache   . Depression   . Diarrhea 05/28/2015  . Dyslipidemia 01/19/2016  . Fibroids, submucosal 10/24/2018  . GERD (gastroesophageal reflux disease)   . History of kidney stones    history of  . History of nuclear stress test 07/15/2008   lexiscan; mild-mod perfusion defect in mid anteroseptal, apical anterior, apical septal regions (attenuation artifiact), prominent gut uptake activity in infero-apical region; post-stress EF 64%; EKG negative for ischemia; patient experienced CP during test  . Hx of cardiac catheterization 2006   no significant CAD  . IBS (irritable bowel syndrome)   . Irritable bowel syndrome   . Nausea 05/28/2015  . Neck pain   . Numerous moles 01/18/2016  . RLQ abdominal pain 05/28/2015  . Seizures ([place])    2 years ago; unknown etiology and none since then and on no meds  . Vaginal discharge 05/28/2015  . Weight gain 01/18/2016    Past Surgical History:  Procedure Laterality Date  . BIOPSY N/A 02/25/2014   Procedure: BIOPSY;  Surgeon: Rogene Houston, MD;  [facility]: [facility];  Service: Endoscopy;  Laterality: N/A;  . BREAST ENHANCEMENT SURGERY    . CARDIAC CATHETERIZATION  1025//2006   no significant CAD, mild-mod depressed LV systolic function, EF 62-22%,  mild-mod AI (Dr. Gerrie Nordmann)   . CARPAL TUNNEL RELEASE Bilateral 01/01/2016  . CESAREAN SECTION    . COLONOSCOPY  05/27/2011   snare polypectomy   . COLONOSCOPY WITH PROPOFOL N/A 10/21/2016   Procedure: COLONOSCOPY WITH PROPOFOL;  Surgeon: Rogene Houston, MD;  Location: [facility];  Service: Endoscopy;  Laterality: N/A;  10:30  . COLONOSCOPY WITH PROPOFOL N/A 08/02/2019   Procedure: COLONOSCOPY WITH PROPOFOL;  Surgeon: Rogene Houston, MD;  Location: [facility];  Service: Endoscopy;  Laterality: N/A;  7:30  . ESOPHAGOGASTRODUODENOSCOPY N/A 02/25/2014   Procedure: ESOPHAGOGASTRODUODENOSCOPY (EGD);  Surgeon: Rogene Houston, MD;  [facility]: [facility];   Service: Endoscopy;  Laterality: N/A;  730  . ESOPHAGOGASTRODUODENOSCOPY (EGD) WITH PROPOFOL N/A 08/02/2019   Procedure: ESOPHAGOGASTRODUODENOSCOPY (EGD) WITH PROPOFOL;  Surgeon: Rogene Houston, MD;  [facility]: [facility];  Service: Endoscopy;  Laterality: N/A;  . KNEE SURGERY Right   . PARTIAL KNEE ARTHROPLASTY Right 03/07/2018   Procedure: Right knee medial unicompartmental arthroplasty;  Surgeon: Gaynelle Arabian, MD;  Location: [facility];  Service: Orthopedics;  Laterality: Right;  with block  . POLYPECTOMY  08/02/2019   Procedure: POLYPECTOMY;  Surgeon: Rogene Houston, MD;  [facility]: [facility];  Service: Endoscopy;;  cold snare distal sigmoid  . TRANSTHORACIC ECHOCARDIOGRAM  01/8412   LV systolic function is low-normal; RV is normal in size & function; mild MR, mild TR  . TUBAL LIGATION      Family Psychiatric History: see below  Family History:  Family History  Problem Relation Age of Onset  . Other Mother        heart problems  . Other Brother        neck and back surgery  . Other Maternal Grandmother        brain tumor  . Leukemia Maternal Grandfather   . Dementia Father     Social History:  Social History   Socioeconomic History  . Marital status: Single    Spouse name: Not on file  . Number of children: 4  . Years of education: 9  . Highest education level: Not on file  Occupational History    Employer: [employer]  Tobacco Use  . Smoking status: Current Every Day Smoker    Packs/day: 0.50    Years: 28.00    Pack years: 14.00    Types: Cigarettes    Start date: 10/11/1986  . Smokeless tobacco: Never Used  Substance and Sexual Activity  . Alcohol use: Yes    Alcohol/week: 1.0 standard drinks    Types: 1 Glasses of wine per week    Comment: occasionally   . Drug use: Yes    Types: Marijuana    Comment: twice a week  . Sexual activity: Yes    Birth control/protection: Surgical    Comment: tubal  Other Topics Concern  . Not on file  Social History  Narrative  . Not on file   Social Determinants of Health   Financial Resource Strain:   . Difficulty of Paying Living Expenses: Not on file  Food Insecurity:   . Worried About Charity fundraiser in the Last Year: Not on file  . Ran Out of Food in the Last Year: Not on file  Transportation Needs:   . Lack of Transportation (Medical): Not on file  . Lack of Transportation (Non-Medical): Not on file  Physical Activity:   . Days of Exercise per Week: Not on file  .  Minutes of Exercise per Session: Not on file  Stress:   . Feeling of Stress : Not on file  Social Connections:   . Frequency of Communication with Friends and Family: Not on file  . Frequency of Social Gatherings with Friends and Family: Not on file  . Attends Religious Services: Not on file  . Active Member of Clubs or Organizations: Not on file  . Attends Archivist Meetings: Not on file  . Marital Status: Not on file    Allergies:  Allergies  Allergen Reactions  . Dicyclomine Palpitations    Patient states that her heart rate will go real fast when she takes this medication.  . Other Other (See Comments)    House dust  . Flagyl [Metronidazole Hcl] Nausea And Vomiting  . Morphine And Related Other (See Comments)    Headache   . Penicillins Other (See Comments)    Caused patient to pass out Did it involve swelling of the face/tongue/throat, SOB, or low BP? No Did it involve sudden or severe rash/hives, skin peeling, or any reaction on the inside of your mouth or nose? No Did you need to seek medical attention at a hospital or doctor's office? No When did it last happen?20 + years If all above answers are "NO", may proceed with cephalosporin use.     Metabolic Disorder Labs: Lab Results  Component Value Date   HGBA1C 5.4 08/08/2017   No results found for: PROLACTIN Lab Results  Component Value Date   CHOL 189 08/08/2017   TRIG 244 (H) 08/08/2017   HDL 40 08/08/2017   CHOLHDL 4.7 (H)  08/08/2017   LDLCALC 100 (H) 08/08/2017   LDLCALC 72 01/18/2016   Lab Results  Component Value Date   TSH 0.34 (L) 07/29/2019   TSH 0.402 (L) 11/22/2017    Therapeutic Level Labs: No results found for: LITHIUM No results found for: VALPROATE No components found for:  CBMZ  Current Medications: Current Outpatient Medications  Medication Sig Dispense Refill  . aspirin EC 81 MG tablet Take 81 mg by mouth daily as needed (chest pain).     . cetirizine (ZYRTEC) 10 MG tablet cetirizine 10 mg tablet   10 mg by oral route.    . chlorhexidine (PERIDEX) 0.12 % solution SMARTSIG:0.5 Ounce(s) By Mouth Twice Daily    . estrogen, conjugated,-medroxyprogesterone (PREMPRO) 0.625-5 MG tablet Take 1 tablet by mouth daily. 30 tablet 3  . fluticasone (FLONASE) 50 MCG/ACT nasal spray Place 2 sprays into both nostrils daily as needed for allergies.     Marland Kitchen gabapentin (NEURONTIN) 300 MG capsule gabapentin 300 mg capsule  TAKE 1 CAPSULE BY MOUTH THREE TIMES A DAY.    . IBU 400 MG tablet Take 400 mg by mouth 4 (four) times daily as needed.    . loratadine (CLARITIN) 10 MG tablet Take 10 mg by mouth daily.    Marland Kitchen LORazepam (ATIVAN) 1 MG tablet Take 1 tablet (1 mg total) by mouth 2 (two) times daily as needed for anxiety. 60 tablet 3  . naloxone (NARCAN) 4 MG/0.1ML LIQD nasal spray kit Narcan 4 mg/actuation nasal spray  Take by nasal route every 3 minutes until patient awakes or EMS arrives.    . ondansetron (ZOFRAN) 8 MG tablet TAKE 1 TABLET BY MOUTH EVERY 8 HOURS AS NEEDED FOR NAUSEA AND VOMITING. (Patient taking differently: Take 8 mg by mouth every 8 (eight) hours as needed for nausea or vomiting. ) 20 tablet 0  . oxyCODONE-acetaminophen (PERCOCET)  10-325 MG tablet Take 1 tablet by mouth every 6 (six) hours as needed for pain.     . pantoprazole (PROTONIX) 40 MG tablet Take 1 tablet (40 mg total) by mouth 2 (two) times daily before a meal. 30 tablet 4  . PROAIR HFA 108 (90 Base) MCG/ACT inhaler USE 2 PUFFS  EVERY 6 HOURS AS NEEDED FOR WHEEZING OR SHORTNESS OF BREATH. (Patient taking differently: Inhale 2 puffs into the lungs every 6 (six) hours as needed for wheezing or shortness of breath. ) 8.5 g 0  . promethazine (PHENERGAN) 25 MG tablet Take 25 mg by mouth every 6 (six) hours as needed for nausea or vomiting.    . Tetrahydrozoline HCl (VISINE OP) Place 1 drop into both eyes daily as needed (dry / allergy eyes).    . traZODone (DESYREL) 100 MG tablet Take 1 tablet (100 mg total) by mouth at bedtime. 90 tablet 0   No current facility-administered medications for this visit.        Psychiatric Specialty Exam: Review of Systems  There were no vitals taken for this visit.There is no height or weight on file to calculate BMI.  General Appearance: unable to assess due to phone visit  Eye Contact:  unable to assess due to phone visit  Speech:  Clear and Coherent and Normal Rate  Volume:  Normal  Mood:  Euthymic  Affect:  Congruent  Thought Process:  Goal Directed, Linear and Descriptions of Associations: Intact  Orientation:  Full (Time, Place, and Person)  Thought Content: Logical   Suicidal Thoughts:  No  Homicidal Thoughts:  No  Memory:  Recent;   Good Remote;   Good  Judgement:  Fair  Insight:  Fair  Psychomotor Activity:  Normal  Concentration:  Concentration: Good and Attention Span: Good  Recall:  Good  Fund of Knowledge: Good  Language: Good  Akathisia:  Negative  Handed:  Right  AIMS (if indicated): not done  Assets:  Communication Skills Desire for Improvement Financial Resources/Insurance Housing  ADL's:  Intact  Cognition: WNL  Sleep:  Poor, ran out of Trazodone     Screenings: PHQ2-9     Office Visit from 01/06/2020 in [facility] from 04/19/2018 in [facility] Office Visit from 12/22/2017 in [facility] Office Visit from 08/09/2017 in [place] Office Visit from 07/31/2017 in [facility]  PHQ-2 Total Score  0  0   _0 PHQ-9 Total Score  --  --  _1 Assessment and Plan: 49 y.o. year old female with a history of PTSD, depression, anxiety, GERD , who was contacted via phone for follow. Pt reported that she has had a few psychosocial stressors and she would like to go up on the dose of Trazodone. She also wants to talk to therapist asap.  1. MDD (major depressive disorder), recurrent, in partial remission (H[facility])  - LORazepam (ATIVAN) 1 MG tablet; Take 1 tablet (1 mg total) by mouth 2 (two) times daily as needed for anxiety.  Dispense: 60 tablet; Refill: 1 - DULoxetine (CYMBALTA) 60 MG capsule; Take 1 capsule (60 mg total) by mouth daily.  Dispense: 90 capsule; Refill: 0 - ARIPiprazole (ABILIFY) 2 MG tablet; Take 1 tablet (2 mg total) by mouth daily.  Dispense: 90 tablet; Refill: 0 - Increase  traZODone (DESYREL) 150 MG tablet; Take 1 tablet (150 mg total) by mouth at  bedtime.  Dispense: 90 tablet; Refill: 0  2. PTSD (post-traumatic stress disorder)  - LORazepam (ATIVAN) 1 MG tablet; Take 1 tablet (1 mg total) by mouth 2 (two) times daily as needed for anxiety.  Dispense: 60 tablet; Refill: 1 - DULoxetine (CYMBALTA) 60 MG capsule; Take 1 capsule (60 mg total) by mouth daily.  Dispense: 90 capsule; Refill: 0  Resume individual therapy sessions. F/up in 2 months with Dr. Modesta Messing.    [place] Crane, MD 01/14/2020, 4:05 PM

## 2020-01-20 ENCOUNTER — Ambulatory Visit (HOSPITAL_COMMUNITY): Payer: Medicaid Other | Admitting: Psychiatry

## 2020-01-30 ENCOUNTER — Telehealth: Payer: Self-pay | Admitting: *Deleted

## 2020-01-30 NOTE — Telephone Encounter (Signed)
Pt aware that I have not seen mammogram from [facility]

## 2020-01-30 NOTE — Telephone Encounter (Signed)
Telephoned patient at home number and patient states Derrek Monaco, NP was going to schedule diagnostic mammogram. She has not heard anything about this yet. Did not know if we received previous records. Advised would check with Anderson Malta and someone would give her a call back. Patient voiced understanding.

## 2020-01-30 NOTE — Telephone Encounter (Signed)
Calling to find out about getting a mammogram.

## 2020-02-10 ENCOUNTER — Ambulatory Visit: Payer: Medicaid Other | Attending: Internal Medicine

## 2020-02-10 ENCOUNTER — Other Ambulatory Visit: Payer: Self-pay

## 2020-02-10 DIAGNOSIS — Z20822 Contact with and (suspected) exposure to covid-19: Secondary | ICD-10-CM

## 2020-02-11 ENCOUNTER — Telehealth: Payer: Self-pay | Admitting: *Deleted

## 2020-02-11 LAB — NOVEL CORONAVIRUS, NAA: SARS-CoV-2, NAA: NOT DETECTED

## 2020-02-11 NOTE — Telephone Encounter (Signed)
Pt called for result of COVID test obtained 02/10/20; informed pt result is still pending , and will be available first in MyChart; pt also notified she will receive a call regarding result; she was encouraged to answer all calls; she verbalized understanding.
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
## 2020-03-13 ENCOUNTER — Encounter (HOSPITAL_COMMUNITY): Payer: Self-pay | Admitting: Psychiatry

## 2020-03-13 ENCOUNTER — Other Ambulatory Visit: Payer: Self-pay

## 2020-03-13 ENCOUNTER — Telehealth (HOSPITAL_COMMUNITY): Payer: Self-pay | Admitting: Psychiatry

## 2020-03-13 ENCOUNTER — Ambulatory Visit (INDEPENDENT_AMBULATORY_CARE_PROVIDER_SITE_OTHER): Payer: Medicaid Other | Admitting: Psychiatry

## 2020-03-13 DIAGNOSIS — F331 Major depressive disorder, recurrent, moderate: Secondary | ICD-10-CM

## 2020-03-13 DIAGNOSIS — F4310 Post-traumatic stress disorder, unspecified: Secondary | ICD-10-CM

## 2020-03-13 DIAGNOSIS — F3341 Major depressive disorder, recurrent, in partial remission: Secondary | ICD-10-CM

## 2020-03-13 MED ORDER — DIAZEPAM 2 MG PO TABS: 1.0000 mg | ORAL_TABLET | Freq: Every day | ORAL | 1 refills | Status: DC | PRN

## 2020-03-13 MED ORDER — DULOXETINE HCL 60 MG PO CPEP: 60.0000 mg | ORAL_CAPSULE | Freq: Every day | ORAL | 0 refills | Status: DC

## 2020-03-13 MED ORDER — ARIPIPRAZOLE 5 MG PO TABS: 5.0000 mg | ORAL_TABLET | Freq: Every day | ORAL | 0 refills | Status: DC

## 2020-03-13 MED ORDER — TRAZODONE HCL 150 MG PO TABS: 150.0000 mg | ORAL_TABLET | Freq: Every day | ORAL | 0 refills | Status: DC

## 2020-03-13 NOTE — Patient Instructions (Signed)
1.Continue duloxetine60 mg daily  2. Increae Abilify 5 mg daily  3. Discontinue lorazepam  4.Continue Trazodone 100 mg at night as needed for sleep 5. Next appointment: 6/10 at 1:20

## 2020-03-13 NOTE — Telephone Encounter (Signed)
Contacted the phone twice for the appointment today. She did not answer the phone. No option to leave a voice message.

## 2020-04-06 ENCOUNTER — Ambulatory Visit: Payer: Medicaid Other | Admitting: Adult Health

## 2020-04-14 ENCOUNTER — Other Ambulatory Visit: Payer: Self-pay | Admitting: Adult Health

## 2020-04-20 ENCOUNTER — Encounter: Payer: Self-pay | Admitting: Adult Health

## 2020-04-20 ENCOUNTER — Other Ambulatory Visit: Payer: Self-pay

## 2020-04-20 ENCOUNTER — Ambulatory Visit: Payer: Medicaid Other | Admitting: Adult Health

## 2020-04-20 VITALS — BP 115/69 | HR 92 | Ht 60.0 in | Wt 128.0 lb

## 2020-04-20 DIAGNOSIS — Z79890 Hormone replacement therapy (postmenopausal): Secondary | ICD-10-CM | POA: Diagnosis not present

## 2020-04-20 DIAGNOSIS — D229 Melanocytic nevi, unspecified: Secondary | ICD-10-CM | POA: Diagnosis not present

## 2020-04-20 DIAGNOSIS — L720 Epidermal cyst: Secondary | ICD-10-CM | POA: Insufficient documentation

## 2020-04-20 MED ORDER — PREMPRO 0.625-2.5 MG PO TABS: ORAL_TABLET | ORAL | 6 refills | Status: DC

## 2020-04-20 NOTE — Progress Notes (Signed)
  Subjective:     Patient ID: Julie [name], female   DOB: 1971/02/25, 49 y.o.   MRN: [id number]  HPI Julie [name] is a 49 year old white female, single, back in follow up on starting Prempro and hot flashes are better, np pelvic pain, vaginal dryness better but libido only  A little better. Has mole right ribs that is getting bigger and catches on clothes,  And has inclusion cysts right under arm she wants removed. She moved out on boyfriend and is happier. PCP is [facility].     Review of Systems Hot flashes much better, has is vaginal dryness Denies any pelvic pain now  Libido is a little better +cysts right under arm and a mole is getting bigger right rib area Missed few Prempro and had brown discharge but it stopped when got back on Prempro Reviewed past medical,surgical, social and family history. Reviewed medications and allergies.     Objective:   Physical Exam .BP 115/69 (BP Location: Right Arm, Patient Position: Sitting, Cuff Size: Normal)   Pulse 92   Ht 5' (1.524 m)   Wt 128 lb (58.1 kg)   BMI 25.00 kg/m  Skin warm and dry. Lungs: clear to ausculation bilaterally. Cardiovascular: regular rate and rhythm. Has inclusion cysts right under arm and has mole right rib area that is growing, it seems darker too.     Assessment:     1. Hormone replacement therapy (HRT) Will continue Prempro Meds ordered this encounter  Medications  . estrogen, conjugated,-medroxyprogesterone (PREMPRO) 0.625-2.5 MG tablet    Sig: TAKE (1) TABLET BY MOUTH ONCE DAILY.    Dispense:  28 tablet    Refill:  6    Order Specific Question:   Supervising Provider    Answer:   Elonda Husky, LUTHER H [[id number]]    2. Numerous moles Referred to Dr Constance Haw for removal  3. Inclusion cyst Referred to Dr Constance Haw for removal     Plan:     Follow up in 3 months

## 2020-05-05 ENCOUNTER — Telehealth: Payer: Self-pay

## 2020-05-05 ENCOUNTER — Encounter: Payer: Self-pay | Admitting: General Surgery

## 2020-05-05 ENCOUNTER — Other Ambulatory Visit: Payer: Self-pay

## 2020-05-05 ENCOUNTER — Ambulatory Visit (INDEPENDENT_AMBULATORY_CARE_PROVIDER_SITE_OTHER): Payer: Medicaid Other | Admitting: General Surgery

## 2020-05-05 VITALS — BP 100/65 | HR 100 | Temp 98.2°F | Resp 12 | Ht 60.0 in | Wt 130.0 lb

## 2020-05-05 DIAGNOSIS — R590 Localized enlarged lymph nodes: Secondary | ICD-10-CM | POA: Diagnosis not present

## 2020-05-05 DIAGNOSIS — L723 Sebaceous cyst: Secondary | ICD-10-CM | POA: Diagnosis not present

## 2020-05-05 DIAGNOSIS — L821 Other seborrheic keratosis: Secondary | ICD-10-CM

## 2020-05-05 DIAGNOSIS — T8549XA Other mechanical complication of breast prosthesis and implant, initial encounter: Secondary | ICD-10-CM | POA: Insufficient documentation

## 2020-05-05 NOTE — Progress Notes (Signed)
[facility] History and Physical  Reason for Referral: Right axillary Cyst, right chest wall nevus  Referring Physician:  Derrek Monaco, NP ([facility])   Chief Complaint    New Patient (Initial Visit)      Julie [name] is a 49 y.o. female.  HPI:  Julie [name] is a 49 yo who was recently seen by her Ob and has had known right axillary cyst and a raised mole / lesion on her rib cage on there right. Given these issues she was referred for possible excision of both.  She says that her axilla has been imaged in the past and confirmed cyst lesion, and says that they have never been swollen or infected. These areas are tender. The mole/ lesion on the right chest wall has been there for over 10 years and has grown in nature over time. She says that it gets caught on clothes and has changed colors.  She tans and has other moles and similar appearing lesions on her body. This is the largest.    She has not had a mammogram since 2019, and has a history of breast implants. She does not know if they are below the muscle or not, and says that she gets significant pain from the left breast.   Past Medical History:  Diagnosis Date  . Anger   . Anxiety   . Aortic insufficiency 2006   Noted at heart cath - mild to moderate  . Arthritis   . Asthma   . Back pain   . BV (bacterial vaginosis) 05/28/2015  . Chronic diarrhea   . Chronic headache   . Depression   . Diarrhea 05/28/2015  . Dyslipidemia 01/19/2016  . Fibroids, submucosal 10/24/2018  . GERD (gastroesophageal reflux disease)   . History of kidney stones    history of  . History of nuclear stress test 07/15/2008   lexiscan; mild-mod perfusion defect in mid anteroseptal, apical anterior, apical septal regions (attenuation artifiact), prominent gut uptake activity in infero-apical region; post-stress EF 64%; EKG negative for ischemia; patient experienced CP during test  . Hx of cardiac catheterization 2006   no  significant CAD  . IBS (irritable bowel syndrome)   . Irritable bowel syndrome   . Nausea 05/28/2015  . Neck pain   . Numerous moles 01/18/2016  . RLQ abdominal pain 05/28/2015  . Seizures ([place])    2 years ago; unknown etiology and none since then and on no meds  . Vaginal discharge 05/28/2015  . Weight gain 01/18/2016    Past Surgical History:  Procedure Laterality Date  . BIOPSY N/A 02/25/2014   Procedure: BIOPSY;  Surgeon: Rogene Houston, MD;  [facility]: [facility];  Service: Endoscopy;  Laterality: N/A;  . BREAST ENHANCEMENT SURGERY    . CARDIAC CATHETERIZATION  1025//2006   no significant CAD, mild-mod depressed LV systolic function, EF 95-09%, mild-mod AI (Dr. Gerrie Nordmann)   . CARPAL TUNNEL RELEASE Bilateral 01/01/2016  . CESAREAN SECTION    . COLONOSCOPY  05/27/2011   snare polypectomy   . COLONOSCOPY WITH PROPOFOL N/A 10/21/2016   Procedure: COLONOSCOPY WITH PROPOFOL;  Surgeon: Rogene Houston, MD;  [facility]: [facility];  Service: Endoscopy;  Laterality: N/A;  10:30  . COLONOSCOPY WITH PROPOFOL N/A 08/02/2019   Procedure: COLONOSCOPY WITH PROPOFOL;  Surgeon: Rogene Houston, MD;  [facility]: [facility];  Service: Endoscopy;  Laterality: N/A;  7:30  . ESOPHAGOGASTRODUODENOSCOPY N/A 02/25/2014   Procedure: ESOPHAGOGASTRODUODENOSCOPY (EGD);  Surgeon: Rogene Houston, MD;  [facility];  Service: Endoscopy;  Laterality: N/A;  730  . ESOPHAGOGASTRODUODENOSCOPY (EGD) WITH PROPOFOL N/A 08/02/2019   Procedure: ESOPHAGOGASTRODUODENOSCOPY (EGD) WITH PROPOFOL;  Surgeon: Rogene Houston, MD;  [facility];  Service: Endoscopy;  Laterality: N/A;  . KNEE SURGERY Right   . PARTIAL KNEE ARTHROPLASTY Right 03/07/2018   Procedure: Right knee medial unicompartmental arthroplasty;  Surgeon: Gaynelle Arabian, MD;  Location: [facility];  Service: Orthopedics;  Laterality: Right;  with block  . POLYPECTOMY  08/02/2019   Procedure: POLYPECTOMY;  Surgeon: Rogene Houston, MD;   [facility]: [facility];  Service: Endoscopy;;  cold snare distal sigmoid  . TRANSTHORACIC ECHOCARDIOGRAM  06/6733   LV systolic function is low-normal; RV is normal in size & function; mild MR, mild TR  . TUBAL LIGATION      Family History  Problem Relation Age of Onset  . Other Mother        heart problems  . Other Brother        neck and back surgery  . Other Maternal Grandmother        brain tumor  . Leukemia Maternal Grandfather   . Dementia Father     Social History   Tobacco Use  . Smoking status: Current Every Day Smoker    Packs/day: 0.50    Years: 28.00    Pack years: 14.00    Types: Cigarettes    Start date: 10/11/1986  . Smokeless tobacco: Never Used  Substance Use Topics  . Alcohol use: Yes    Alcohol/week: 1.0 standard drinks    Types: 1 Glasses of wine per week    Comment: occasionally   . Drug use: Yes    Types: Marijuana    Comment: twice a week    Medications: I have reviewed the patient's current medications. Allergies as of 05/05/2020      Reactions   Dicyclomine Palpitations   Patient states that her heart rate will go real fast when she takes this medication.   Other Other (See Comments)   House dust   Flagyl [metronidazole Hcl] Nausea And Vomiting   Morphine And Related Other (See Comments)   Headache    Penicillins Other (See Comments)   Caused patient to pass out Did it involve swelling of the face/tongue/throat, SOB, or low BP? No Did it involve sudden or severe rash/hives, skin peeling, or any reaction on the inside of your mouth or nose? No Did you need to seek medical attention at a hospital or doctor's office? No When did it last happen?20 + years If all above answers are "NO", may proceed with cephalosporin use.      Medication List       Accurate as of May 05, 2020  3:50 PM. If you have any questions, ask your nurse or doctor.        STOP taking these medications   LORazepam 1 MG tablet Commonly known as:  ATIVAN Stopped by: Virl Cagey, MD     TAKE these medications   ARIPiprazole 5 MG tablet Commonly known as: ABILIFY Take 1 tablet (5 mg total) by mouth daily.   aspirin EC 81 MG tablet Take 81 mg by mouth daily as needed (chest pain).   cetirizine 10 MG tablet Commonly known as: ZYRTEC cetirizine 10 mg tablet   10 mg by oral route.   chlorhexidine 0.12 % solution Commonly known as: PERIDEX SMARTSIG:0.5 Ounce(s) By Mouth Twice  Daily   diazepam 2 MG tablet Commonly known as: VALIUM Take 0.5 tablets (1 mg total) by mouth daily as needed for anxiety.   DULoxetine 60 MG capsule Commonly known as: Cymbalta Take 1 capsule (60 mg total) by mouth daily.   fluticasone 50 MCG/ACT nasal spray Commonly known as: FLONASE Place 2 sprays into both nostrils daily as needed for allergies.   gabapentin 300 MG capsule Commonly known as: NEURONTIN gabapentin 300 mg capsule  TAKE 1 CAPSULE BY MOUTH THREE TIMES A DAY.   IBU 400 MG tablet Generic drug: ibuprofen Take 400 mg by mouth 4 (four) times daily as needed.   loratadine 10 MG tablet Commonly known as: CLARITIN Take 10 mg by mouth daily.   Narcan 4 MG/0.1ML Liqd nasal spray kit Generic drug: naloxone Narcan 4 mg/actuation nasal spray  Take by nasal route every 3 minutes until patient awakes or EMS arrives.   ondansetron 8 MG tablet Commonly known as: ZOFRAN TAKE 1 TABLET BY MOUTH EVERY 8 HOURS AS NEEDED FOR NAUSEA AND VOMITING. What changed:   how much to take  how to take this  when to take this  reasons to take this  additional instructions   oxyCODONE-acetaminophen 10-325 MG tablet Commonly known as: PERCOCET Take 1 tablet by mouth every 6 (six) hours as needed for pain.   pantoprazole 40 MG tablet Commonly known as: Protonix Take 1 tablet (40 mg total) by mouth 2 (two) times daily before a meal.   Prempro 0.625-2.5 MG tablet Generic drug: estrogen (conjugated)-medroxyprogesterone TAKE (1) TABLET BY  MOUTH ONCE DAILY.   ProAir HFA 108 (90 Base) MCG/ACT inhaler Generic drug: albuterol USE 2 PUFFS EVERY 6 HOURS AS NEEDED FOR WHEEZING OR SHORTNESS OF BREATH. What changed: See the new instructions.   promethazine 25 MG tablet Commonly known as: PHENERGAN Take 25 mg by mouth every 6 (six) hours as needed for nausea or vomiting.   traZODone 150 MG tablet Commonly known as: DESYREL Take 1 tablet (150 mg total) by mouth at bedtime.   VISINE OP Place 1 drop into both eyes daily as needed (dry / allergy eyes).        ROS:  A comprehensive review of systems was negative except for: Respiratory: positive for cough Gastrointestinal: positive for nausea and reflux symptoms Musculoskeletal: positive for back pain, neck pain and stiff joints Neurological: positive for dizziness Behavioral/Psych: positive for anxiety and depression Endocrine: positive for temperature intolerance and tired/ sluggish  Blood pressure 100/65, pulse 100, temperature 98.2 F (36.8 C), temperature source Oral, resp. rate 12, height 5' (1.524 m), weight 130 lb (59 kg), SpO2 98 %. Physical Exam Vitals reviewed.  Constitutional:      Appearance: She is normal weight.  HENT:     Head: Normocephalic and atraumatic.     Nose: Nose normal.     Mouth/Throat:     Mouth: Mucous membranes are moist.  Eyes:     Pupils: Pupils are equal, round, and reactive to light.  Cardiovascular:     Rate and Rhythm: Normal rate and regular rhythm.  Pulmonary:     Effort: Pulmonary effort is normal.     Breath sounds: Normal breath sounds.  Chest:     Breasts:        Right: No mass.        Left: Skin change and tenderness present. No mass.     Comments: Left axillary adenopathy, 1.5cm node, left breast with rippling of the skin inferior, silicone easily  palpable, right breast with implant mobile, no obvious masses on either breast, right axilla tender with superficial lesions noted centrally about 1-2cm; right lateral lower  chest wall inferior to bra line 1.5cm raised, scaly, brown lesion ovoid in shape; two similar lesions on the right upper back about 106m in size each and less raised  Abdominal:     General: There is no distension.     Palpations: Abdomen is soft.     Tenderness: There is no abdominal tenderness.  Musculoskeletal:        General: No swelling. Normal range of motion.     Cervical back: Normal range of motion.  Lymphadenopathy:     Upper Body:     Right upper body: No axillary adenopathy.     Left upper body: Axillary adenopathy present.  Skin:    General: Skin is warm and dry.  Neurological:     General: No focal deficit present.     Mental Status: She is alert and oriented to person, place, and time.  Psychiatric:        Mood and Affect: Mood normal.        Behavior: Behavior normal.        Thought Content: Thought content normal.        Judgment: Judgment normal.     Results: Mammogram [facility] Report 08/2018 in system -Reviewed imaging personally with Dr. JShelly Bombard left implant very superficial to skin, report indicating cystic lesions in right axilla   Assessment & Plan:  Julie AWADALLAHis a 49y.o. female with right axillary lesions and what appears to be a seborrheic keratosis lesion on the right lateral chest wall.  Her implant on the left is causing pain and there is a palpable lymph node on the left. There are no obvious masses and her [facility] [place] no concerning lesions and cyst in the right axilla. It has been two years since her last mammogram.   -Discussed the case with Dr. JShelly Bombardand given the adenopathy on the left will proceed with diagnostic mammogram.   -Patient will ultimately need referred to Dr. DMarla Roefor plastics to discuss her skin rippling as the implant is very superficial in the inferior portion, pending no concerning lesions on the mammogram   -Discussed excision of the axillary cyst on the right and the lesion on the  chest wall which is likely a seborrheic keratosis but could be an abnormal mole given the changes. Discussed risk of bleeding, infection, needing further surgery, need for sedation given the tenderness in the axilla on exam   -Some history of atypical chest pain and evaluated by cardiology in the past with stress test and ECHO. Stress test with some possible coronary spasm, catheterization normal in 2016. ECHO with EF 60%. No recent chest pain reported. Follow up PRN.   -Once we have the mammogram, will make a plan for surgery.   All questions were answered to the satisfaction of the patient.  Future Appointments  Date Time Provider DLonepine 05/14/2020  1:20 PM HNorman Clay MD [facility] None  05/19/2020 11:00 AM [facility] ADeneise LeverPENN H  07/20/2020  3:30 PM GEstill Dooms NP [facility] FZetta Bills   LVirl Cagey6/12/2019, 3:50 PM

## 2020-05-05 NOTE — Patient Instructions (Signed)
Screening mammogram 2021. Someone should get in touch with you in 1-2 weeks with a date.  Will call and discuss excision once the mammogram is completed. Will refer to Plastic Surgery after mammogram regarding left breast.   Epidermal Cyst  An epidermal cyst is a small, painless lump under your skin. The cyst contains a grayish-white, bad-smelling substance (keratin). Do not try to pop or open an epidermal cyst yourself. What are the causes?  A blocked hair follicle.  A hair that curls and re-enters the skin instead of growing straight out of the skin.  A blocked pore.  Irritated skin.  An injury to the skin.  Certain conditions that are passed along from parent to child (inherited).  Human papillomavirus (HPV).  Long-term sun damage to the skin. What increases the risk?  Having acne.  Being overweight.  Being 25-73 years old. What are the signs or symptoms? These cysts are usually harmless, but they can get infected. Symptoms of infection may include:  Redness.  Inflammation.  Tenderness.  Warmth.  Fever.  A grayish-white, bad-smelling substance drains from the cyst.  Pus drains from the cyst. How is this treated? In many cases, epidermal cysts go away on their own without treatment. If a cyst becomes infected, treatment may include:  Opening and draining the cyst, done by a doctor. After draining, you may need minor surgery to remove the rest of the cyst.  Antibiotic medicine.  Shots of medicines (steroids) that help to reduce inflammation.  Surgery to remove the cyst. Surgery may be done if the cyst: ? Becomes large. ? Bothers you. ? Has a chance of turning into cancer.  Do not try to open a cyst yourself. Follow these instructions at home:  Take over-the-counter and prescription medicines only as told by your doctor.  If you were prescribed an antibiotic medicine, take it it as told by your doctor. Do not stop using the antibiotic even if you  start to feel better.  Keep the area around your cyst clean and dry.  Wear loose, dry clothing.  Avoid touching your cyst.  Check your cyst every day for signs of infection. Check for: ? Redness, swelling, or pain. ? Fluid or blood. ? Warmth. ? Pus or a bad smell.  Keep all follow-up visits as told by your doctor. This is important. How is this prevented?  Wear clean, dry, clothing.  Avoid wearing tight clothing.  Keep your skin clean and dry. Take showers or baths every day. Contact a doctor if:  Your cyst has symptoms of infection.  Your condition does not improve or gets worse.  You have a cyst that looks different from other cysts you have had.  You have a fever. Get help right away if:  Redness spreads from the cyst into the area close by. Summary  An epidermal cyst is a sac made of skin tissue.  If a cyst becomes infected, treatment may include surgery to open and drain the cyst, or to remove it.  Take over-the-counter and prescription medicines only as told by your doctor.  Contact a doctor if your condition is not improving or is getting worse.  Keep all follow-up visits as told by your doctor. This is important. This information is not intended to replace advice given to you by your health care provider. Make sure you discuss any questions you have with your health care provider. Document Revised: 03/14/2019 Document Reviewed: 08/30/2018 Elsevier Patient Education  [place].   Actinic Keratosis  An actinic keratosis is a precancerous growth on the skin. If there is more than one growth, the condition is called actinic keratoses. Actinic keratoses appear most often on areas of skin that get a lot of sun exposure, including the scalp, face, ears, lips, upper back, forearms, and the backs of the hands. If left untreated, these growths may develop into a skin cancer called squamous cell carcinoma. It is important to have all these growths checked by a  health care provider to determine the best treatment approach. What are the causes? Actinic keratoses are caused by getting too much ultraviolet (UV) radiation from the sun or other UV light sources. What increases the risk? You are more likely to develop this condition if you:  Have light-colored skin and blue eyes.  Have blond or red hair.  Spend a lot of time in the sun.  Do not protect your skin from the sun when outdoors.  Are an older person. The risk of developing an actinic keratosis increases with age. What are the signs or symptoms? Actinic keratoses feel like scaly, rough spots of skin. Symptoms of this condition include growths that may:  Be as small as a pinhead or as big as a quarter.  Itch, hurt, or feel sensitive.  Be skin-colored, light tan, dark tan, pink, or a combination of any of these colors. In most cases, the growths become red.  Have a small piece of pink or gray skin (skin tag) growing from them. It may be easier to notice actinic keratoses by feeling them, rather than seeing them. Sometimes, actinic keratoses disappear, but many reappear a few days to a few weeks later. How is this diagnosed? This condition is usually diagnosed with a physical exam.  A tissue sample may be removed from the actinic keratosis and examined under a microscope (biopsy). How is this treated? If needed, this condition may be treated by:  Scraping off the actinic keratosis (curettage).  Freezing the actinic keratosis with liquid nitrogen (cryosurgery). This causes the growth to eventually fall off the skin.  Applying medicated creams or gels to destroy the cells in the growth.  Applying chemicals to the actinic keratosis to make the outer layers of skin peel off (chemical peel).  Using photodynamic therapy. In this procedure, medicated cream is applied to the actinic keratosis. This cream increases your skin's sensitivity to light. Then, a strong light is aimed at the  actinic keratosis to destroy cells in the growth. Follow these instructions at home: Skin care  Apply cool, wet cloths (cool compresses) to the affected areas.  Do not scratch your skin.  Check your skin regularly for any growths, especially growths that: ? Start to itch or bleed. ? Change in size, shape, or color. Caring for the treated area  Keep the treated area clean and dry as told by your health care provider.  Do not apply any medicine, cream, or lotion to the treated area unless your health care provider tells you to do that.  Do not pick at blisters or try to break them open. This can cause infection and scarring.  If you have red or irritated skin after treatment, follow instructions from your health care provider about how to take care of the treated area. Make sure you: ? Wash your hands with soap and water before you change your bandage (dressing). If soap and water are not available, use hand sanitizer. ? Change your dressing as told by your health care provider.  If you  have red or irritated skin after treatment, check your treated area every day for signs of infection. Check for: ? Redness, swelling, or pain. ? Fluid or blood. ? Warmth. ? Pus or a bad smell. General instructions  Take or apply over-the-counter and prescription medicines only as told by your health care provider.  Return to your normal activities as told by your health care provider. Ask your health care provider what activities are safe for you.  Have a skin exam done every year by a health care provider who is a skin specialist (dermatologist).  Keep all follow-up visits as told by your health care provider. This is important. Lifestyle  Do not use any products that contain nicotine or tobacco, such as cigarettes and e-cigarettes. If you need help quitting, ask your health care provider.  Take steps to protect your skin from the sun. ? Try to avoid the sun between 10:00 a.m. and 4:00 p.m.  This is when the UV light is the strongest. ? Use a sunscreen or sunblock with SPF 30 (sun protection factor 30) or greater. ? Apply sunscreen before you are exposed to sunlight and reapply as often as directed by the instructions on the sunscreen container. ? Always wear sunglasses that have UV protection, and always wear a hat and clothing to protect your skin from sunlight. ? When possible, avoid medicines that increase your sensitivity to sunlight. ? Do not use tanning beds or other indoor tanning devices. Contact a health care provider if:  You notice any changes or new growths on your skin.  You have swelling, pain, or more redness around your treated area.  You have fluid or blood coming from your treated area.  Your treated area feels warm to the touch.  You have pus or a bad smell coming from your treated area.  You have a fever.  You have a blister that becomes large and painful. Summary  An actinic keratosis is a precancerous growth on the skin. If there is more than one growth, the condition is called actinic keratoses. In some cases, if left untreated, these growths can develop into skin cancer.  Check your skin regularly for any growths, especially growths that start to itch or bleed, or change in size, shape, or color.  Take steps to protect your skin from the sun.  Contact a health care provider if you notice any changes or new growths on your skin.  Keep all follow-up visits as told by your health care provider. This is important. This information is not intended to replace advice given to you by your health care provider. Make sure you discuss any questions you have with your health care provider. Document Revised: 04/03/2018 Document Reviewed: 04/03/2018 Elsevier Patient Education  [place].  Mole A mole is a colored (pigmented) growth on the skin. Moles are very common. They are usually harmless, but some moles can become cancerous over time. What  are the causes? Moles are caused when pigmented skin cells grow together in clusters instead of spreading out in the skin as they normally do. The reason why the skin cells grow together in clusters is not known. What increases the risk? You are more likely to develop a mole if you:  Have family members who have moles.  Are white.  Have blond hair.  Are often outdoors and exposed to the sun.  Received phototherapy when you were a newborn baby.  Are female. What are the signs or symptoms? A mole may be:  Brown or black.  Flat or raised.  Smooth or wrinkled. How is this diagnosed? A mole is diagnosed with a skin exam. If your health care provider thinks a mole may be cancerous, all or part of the mole will be removed for testing (biopsy). How is this treated? Most moles are noncancerous (benign) and do not require treatment. If a mole is found to be cancerous, it will be removed. You may also choose to have a mole removed if it is causing pain or if you do not like the way it looks. Follow these instructions at home: General instructions   Every month, look for new moles and check your existing moles for changes. This is important because a change in a mole can mean that the mole has become cancerous.  ABCDE changes in a mole indicate that you should be evaluated by your health care provider. ABCDE stands for: ? Asymmetry. This means the mole has an irregular shape. It is not round or oval. ? Border. This means the mole has an irregular or bumpy border. ? Color. This means the mole has multiple colors in it, including brown, black, blue, red, or tan. Note that it is normal for moles to get darker when a woman is pregnant or takes birth control pills. ? Diameter. This means the mole is more than 0.2 inches (6 mm) across. ? Evolving. This refers to any unusual changes or symptoms in the mole, such as pain, itching, stinging, sensitivity, or bleeding.  If you have a large number of  moles, see a skin doctor (dermatologist) at least one time every year for a full-body skin check. Lifestyle   When you are outdoors, wear sunscreen with SPF 30 (sun protection factor 30) or higher.  Use an adequate amount of sunscreen to cover exposed areas of skin. Put it on 30 minutes before you go out. Reapply it every 2 hours or anytime you come out of the water.  When you are out in the sun, wear a broad-brimmed hat and clothing that covers your arms and legs. Wear wraparound sunglasses. Contact a health care provider if:  The size, shape, borders, or color of your mole changes.  Your mole, or the skin near the mole, becomes painful, sore, red, or swollen.  Your mole: ? Develops more than one color. ? Itches or bleeds. ? Becomes scaly, sheds skin, or oozes fluid. ? Becomes flat or develops raised areas. ? Becomes hard or soft.  You develop a new mole. Summary  A mole is a colored (pigmented) growth on the skin. Moles are very common. They are usually harmless, but some moles can become cancerous over time.  Every month, look for new moles and check your existing moles for changes. This is important because a change in a mole can mean that the mole has become cancerous.  If you have a large number of moles, see a skin doctor (dermatologist) at least one time every year for a full-body skin check.  When you are outdoors, wear sunscreen with SPF 30 (sun protection factor 30) or higher. Reapply it every 2 hours or anytime you come out of the water.  Contact a health care provider if you notice changes in a mole or if you develop a new mole. This information is not intended to replace advice given to you by your health care provider. Make sure you discuss any questions you have with your health care provider. Document Revised: 06/27/2019 Document Reviewed: 04/17/2018 Elsevier Patient Education  2020 [employer]  

## 2020-05-05 NOTE — Telephone Encounter (Signed)
Mammogram scheduled 05/19/20 @ 11 am [facility] radiology. Patient notified.

## 2020-05-06 ENCOUNTER — Other Ambulatory Visit (HOSPITAL_COMMUNITY): Payer: Self-pay | Admitting: General Surgery

## 2020-05-06 DIAGNOSIS — N6011 Diffuse cystic mastopathy of right breast: Secondary | ICD-10-CM
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
## 2020-05-14 ENCOUNTER — Telehealth (INDEPENDENT_AMBULATORY_CARE_PROVIDER_SITE_OTHER): Payer: Medicaid Other | Admitting: Psychiatry

## 2020-05-14 ENCOUNTER — Encounter (HOSPITAL_COMMUNITY): Payer: Self-pay | Admitting: Psychiatry

## 2020-05-14 ENCOUNTER — Other Ambulatory Visit: Payer: Self-pay

## 2020-05-14 DIAGNOSIS — F331 Major depressive disorder, recurrent, moderate: Secondary | ICD-10-CM

## 2020-05-14 DIAGNOSIS — G4700 Insomnia, unspecified: Secondary | ICD-10-CM | POA: Diagnosis not present

## 2020-05-14 DIAGNOSIS — F4310 Post-traumatic stress disorder, unspecified: Secondary | ICD-10-CM

## 2020-05-14 MED ORDER — TRAZODONE HCL 150 MG PO TABS: 150.0000 mg | ORAL_TABLET | Freq: Every day | ORAL | 0 refills | Status: DC

## 2020-05-14 MED ORDER — LORAZEPAM 1 MG PO TABS: 1.0000 mg | ORAL_TABLET | Freq: Two times a day (BID) | ORAL | 0 refills | Status: DC | PRN

## 2020-05-14 MED ORDER — ARIPIPRAZOLE 5 MG PO TABS: 5.0000 mg | ORAL_TABLET | Freq: Every day | ORAL | 0 refills | Status: DC

## 2020-05-14 MED ORDER — DULOXETINE HCL 30 MG PO CPEP: ORAL_CAPSULE | ORAL | 1 refills | Status: DC

## 2020-05-14 NOTE — Patient Instructions (Addendum)
1.Increase duloxetine90 mg daily  2. Continue Abilify 5 mg daily 3. Discontinue diazepam 4. Reinitiate lorazepam 1 mg twice a day as needed for anxiety 4.ContinueTrazodone100mg  at night as needed for sleep 5. Next appointment: 8/10 at 1:2

## 2020-05-18 ENCOUNTER — Ambulatory Visit (HOSPITAL_COMMUNITY): Payer: Medicaid Other | Admitting: Clinical

## 2020-05-18 ENCOUNTER — Other Ambulatory Visit: Payer: Self-pay

## 2020-05-19 ENCOUNTER — Other Ambulatory Visit: Payer: Self-pay

## 2020-05-19 ENCOUNTER — Ambulatory Visit (HOSPITAL_COMMUNITY)
Admission: RE | Admit: 2020-05-19 | Discharge: 2020-05-19 | Disposition: A | Discharge status: Home / Self Care | Payer: Medicaid Other | Source: Ambulatory Visit | Attending: General Surgery | Admitting: General Surgery

## 2020-05-19 ENCOUNTER — Other Ambulatory Visit: Payer: Self-pay | Admitting: General Surgery

## 2020-05-19 DIAGNOSIS — R590 Localized enlarged lymph nodes: Secondary | ICD-10-CM

## 2020-05-19 DIAGNOSIS — N6011 Diffuse cystic mastopathy of right breast: Secondary | ICD-10-CM | POA: Diagnosis present

## 2020-05-19 DIAGNOSIS — L723 Sebaceous cyst: Secondary | ICD-10-CM | POA: Diagnosis not present

## 2020-05-19 DIAGNOSIS — T8549XA Other mechanical complication of breast prosthesis and implant, initial encounter: Secondary | ICD-10-CM | POA: Insufficient documentation

## 2020-05-29 ENCOUNTER — Telehealth: Payer: Self-pay | Admitting: General Surgery

## 2020-05-29 ENCOUNTER — Encounter: Payer: Self-pay | Admitting: General Surgery

## 2020-05-29 ENCOUNTER — Other Ambulatory Visit (INDEPENDENT_AMBULATORY_CARE_PROVIDER_SITE_OTHER): Payer: Self-pay | Admitting: Gastroenterology

## 2020-05-29 DIAGNOSIS — K219 Gastro-esophageal reflux disease without esophagitis: Secondary | ICD-10-CM

## 2020-05-29 MED ORDER — PANTOPRAZOLE SODIUM 40 MG PO TBEC: 40.0000 mg | DELAYED_RELEASE_TABLET | Freq: Two times a day (BID) | ORAL | 4 refills | Status: DC

## 2020-05-29 NOTE — Progress Notes (Signed)
Refilled ppi per pharmacy request. Due for yearly OV in 07/2019.

## 2020-05-29 NOTE — Telephone Encounter (Signed)
Returned call from patient X 2; Left message for patient to call office.

## 2020-06-01 ENCOUNTER — Other Ambulatory Visit (INDEPENDENT_AMBULATORY_CARE_PROVIDER_SITE_OTHER): Payer: Self-pay | Admitting: Gastroenterology

## 2020-06-01 DIAGNOSIS — K219 Gastro-esophageal reflux disease without esophagitis: Secondary | ICD-10-CM

## 2020-06-01 MED ORDER — PANTOPRAZOLE SODIUM 40 MG PO TBEC: 40.0000 mg | DELAYED_RELEASE_TABLET | Freq: Two times a day (BID) | ORAL | 4 refills | Status: DC

## 2020-06-01 NOTE — Progress Notes (Signed)
Refill request filled.

## 2020-06-02 ENCOUNTER — Ambulatory Visit (HOSPITAL_COMMUNITY)
Admission: RE | Admit: 2020-06-02 | Discharge: 2020-06-02 | Disposition: A | Discharge status: Home / Self Care | Payer: Medicaid Other | Source: Ambulatory Visit | Attending: General Surgery | Admitting: General Surgery

## 2020-06-02 ENCOUNTER — Other Ambulatory Visit: Payer: Self-pay

## 2020-06-02 DIAGNOSIS — N6011 Diffuse cystic mastopathy of right breast: Secondary | ICD-10-CM | POA: Diagnosis present

## 2020-06-02 NOTE — Telephone Encounter (Signed)
Patient returned call. Questions answered in regards to next appointment pending ultrasounds. Informed patient that we would call her once Dr. Constance Haw had reviewed results of ultrasounds. Patient verbalized understanding.

## 2020-06-04 ENCOUNTER — Telehealth (INDEPENDENT_AMBULATORY_CARE_PROVIDER_SITE_OTHER): Payer: Self-pay | Admitting: General Surgery

## 2020-06-04 ENCOUNTER — Ambulatory Visit (HOSPITAL_COMMUNITY): Payer: Medicaid Other | Admitting: Psychiatry

## 2020-06-04 DIAGNOSIS — L821 Other seborrheic keratosis: Secondary | ICD-10-CM

## 2020-06-04 DIAGNOSIS — L723 Sebaceous cyst: Secondary | ICD-10-CM

## 2020-06-04 NOTE — Telephone Encounter (Signed)
[facility]  Called patient to confirm mammogram and [place] axilla are good. Right axillary cyst confirmed and no left adenopathy. She has a right axillary cyst and a chest wall seborrheic keratosis lesion she wants removed.  Need to see if she wants sedation or just local.  Left a message. Notified the office.   Curlene Labrum, MD [facility] [address] [name], [name] [address] [phone] (office)

## 2020-06-05 ENCOUNTER — Telehealth: Payer: Self-pay | Admitting: General Surgery

## 2020-06-05 DIAGNOSIS — L723 Sebaceous cyst: Secondary | ICD-10-CM

## 2020-06-05 DIAGNOSIS — L821 Other seborrheic keratosis: Secondary | ICD-10-CM

## 2020-06-05 NOTE — Telephone Encounter (Signed)
[facility]  Plan for excision of the right axillary cyst (L72.3) 1.5cm and right chest seborrheic keratosis (L82.1) lesion 1.5cm. She wants anesthesia.   She is going to the beach in early [place] and wants to be able to swim. Will hold on doing any excision until after this vacation.  Will see in clinic July 29 to repeat H&P. Scheduled for surgery July 15, 2020.  Office will call patient with more specific information.   Curlene Labrum, MD [facility] [address] [place], [place] [address] [phone] (office)

## 2020-06-09 ENCOUNTER — Other Ambulatory Visit (HOSPITAL_COMMUNITY): Payer: Medicaid Other

## 2020-06-09 NOTE — Telephone Encounter (Signed)
Provider called patient

## 2020-06-15 ENCOUNTER — Ambulatory Visit (HOSPITAL_COMMUNITY): Payer: Medicaid Other | Admitting: Licensed Clinical Social Worker

## 2020-06-15 ENCOUNTER — Telehealth (HOSPITAL_COMMUNITY): Payer: Self-pay | Admitting: Licensed Clinical Social Worker

## 2020-06-15 NOTE — Telephone Encounter (Signed)
I sent patient a [phone] to connect for video session at 11am. I waited until 11:15 am and patient did not connect for session.

## 2020-07-01 ENCOUNTER — Other Ambulatory Visit (HOSPITAL_COMMUNITY): Payer: Self-pay | Admitting: Psychiatry

## 2020-07-01 MED ORDER — LORAZEPAM 1 MG PO TABS: 1.0000 mg | ORAL_TABLET | Freq: Two times a day (BID) | ORAL | 1 refills | Status: DC | PRN

## 2020-07-01 NOTE — Telephone Encounter (Signed)
I have utilized the [place] Controlled Substances Reporting System (PMP AWARxE) to confirm adherence regarding the patient's medication. My review reveals appropriate prescription fills.  

## 2020-07-14 ENCOUNTER — Encounter (HOSPITAL_COMMUNITY): Payer: Self-pay | Admitting: Psychiatry

## 2020-07-14 ENCOUNTER — Telehealth (INDEPENDENT_AMBULATORY_CARE_PROVIDER_SITE_OTHER): Payer: Medicaid Other | Admitting: Psychiatry

## 2020-07-14 ENCOUNTER — Other Ambulatory Visit: Payer: Self-pay

## 2020-07-14 DIAGNOSIS — G4700 Insomnia, unspecified: Secondary | ICD-10-CM | POA: Diagnosis not present

## 2020-07-14 DIAGNOSIS — F4310 Post-traumatic stress disorder, unspecified: Secondary | ICD-10-CM | POA: Diagnosis not present

## 2020-07-14 DIAGNOSIS — F330 Major depressive disorder, recurrent, mild: Secondary | ICD-10-CM

## 2020-07-14 MED ORDER — ARIPIPRAZOLE 5 MG PO TABS: 5.0000 mg | ORAL_TABLET | Freq: Every day | ORAL | 0 refills | Status: DC

## 2020-07-14 MED ORDER — DULOXETINE HCL 60 MG PO CPEP: 60.0000 mg | ORAL_CAPSULE | Freq: Every day | ORAL | 0 refills | Status: DC

## 2020-07-14 MED ORDER — DULOXETINE HCL 30 MG PO CPEP: ORAL_CAPSULE | ORAL | 0 refills | Status: DC

## 2020-07-14 MED ORDER — LORAZEPAM 1 MG PO TABS: 1.0000 mg | ORAL_TABLET | Freq: Two times a day (BID) | ORAL | 1 refills | Status: DC | PRN

## 2020-07-14 MED ORDER — TRAZODONE HCL 150 MG PO TABS: 150.0000 mg | ORAL_TABLET | Freq: Every day | ORAL | 0 refills | Status: DC

## 2020-07-14 NOTE — Patient Instructions (Signed)
1.Continue duloxetine90 mg daily  2. ContinueAbilify 5mg  daily 3. Continue lorazepam 1 mg twice a day as needed for anxiety   4.ContinueTrazodone100mg  at night as needed for sleep 5. Next appointment:10/26 at 4 PM
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
## 2020-07-16 ENCOUNTER — Ambulatory Visit: Payer: Medicaid Other | Admitting: General Surgery

## 2020-07-20 ENCOUNTER — Ambulatory Visit: Payer: Medicaid Other | Admitting: Adult Health

## 2020-07-20 ENCOUNTER — Encounter (INDEPENDENT_AMBULATORY_CARE_PROVIDER_SITE_OTHER): Payer: Self-pay

## 2020-07-21 ENCOUNTER — Ambulatory Visit: Payer: Medicaid Other | Admitting: Adult Health

## 2020-07-30 ENCOUNTER — Ambulatory Visit: Payer: Medicaid Other | Admitting: General Surgery

## 2020-07-31 ENCOUNTER — Ambulatory Visit
Admission: EM | Admit: 2020-07-31 | Discharge: 2020-07-31 | Discharge status: Absent without leave | Payer: Medicaid Other

## 2020-07-31 ENCOUNTER — Ambulatory Visit
Admission: EM | Admit: 2020-07-31 | Discharge: 2020-07-31 | Disposition: A | Discharge status: Home / Self Care | Payer: Medicaid Other | Attending: Emergency Medicine | Admitting: Emergency Medicine

## 2020-07-31 ENCOUNTER — Other Ambulatory Visit: Payer: Self-pay

## 2020-07-31 DIAGNOSIS — Z1152 Encounter for screening for COVID-19: Secondary | ICD-10-CM

## 2020-07-31 NOTE — ED Triage Notes (Signed)
Needs covid test. No s/s

## 2020-08-02 LAB — NOVEL CORONAVIRUS, NAA: SARS-CoV-2, NAA: DETECTED — AB

## 2020-08-02 LAB — SARS-COV-2, NAA 2 DAY TAT

## 2020-08-03 ENCOUNTER — Telehealth: Payer: Self-pay | Admitting: Adult Health

## 2020-08-03 NOTE — Telephone Encounter (Signed)
Called and LMOM regarding monoclonal antibody treatment for COVID 19 given to those who are at risk for complications and/or hospitalization of the virus.  Patient meets criteria based on: heart disease, h/o asthma, recurrent pneumonia  Call back number given: [phone]  My chart message: sent  Wilber Bihari, NP

## 2020-08-04 ENCOUNTER — Ambulatory Visit: Payer: Medicaid Other | Admitting: Adult Health

## 2020-08-24 IMAGING — MG MM  DIGITAL DIAGNOSTIC BREAST BILAT IMPLANT W/ TOMO W/ CAD
8 of 15 series · 8 of 35 positions shown · non-contrast
Comparison: Previous exam(s).

CLINICAL DATA: 49-year-old patient presents for annual mammogram
and evaluation of possible left axillary lymphadenopathy palpated on
recent clinical physical exam. Known benign epidermal inclusion
cysts right axilla.

As a left axillary ultrasound order could not be obtained today, the
ultrasound portion of the exam was not performed.
EXAM:
DIGITAL DIAGNOSTIC BILATERAL MAMMOGRAM WITH IMPLANTS, CAD AND TOMO
The patient has retropectoral implants. Standard and implant
displaced views were performed.

[L CC (1 of 3)]
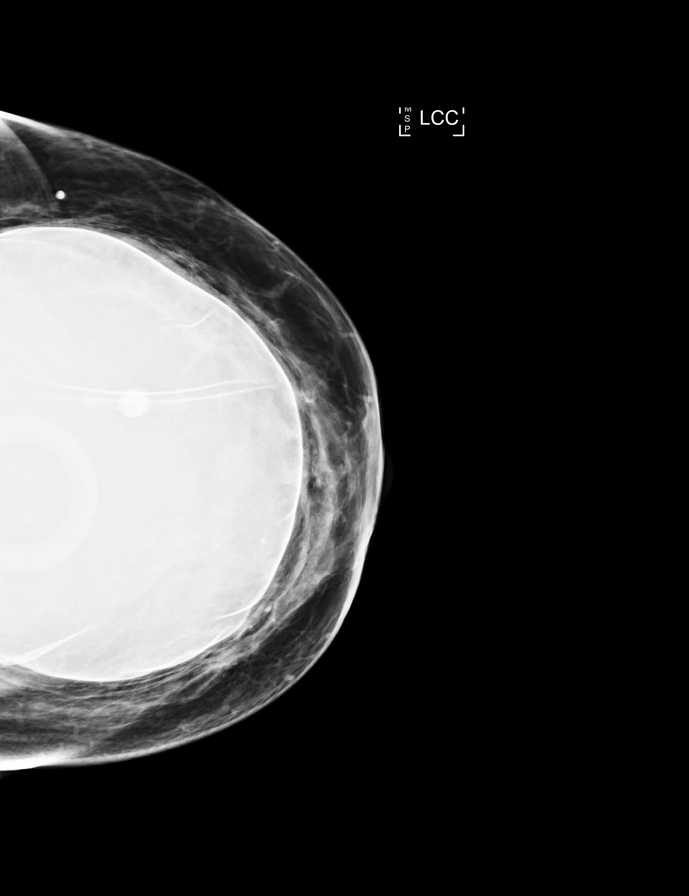

[L MLO (1 of 2)]
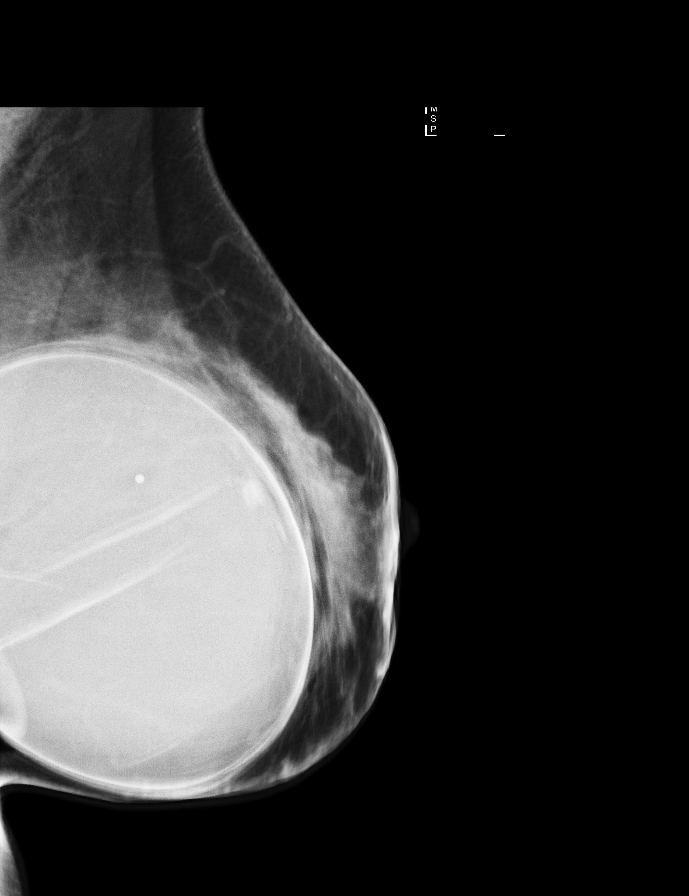

[R CC]
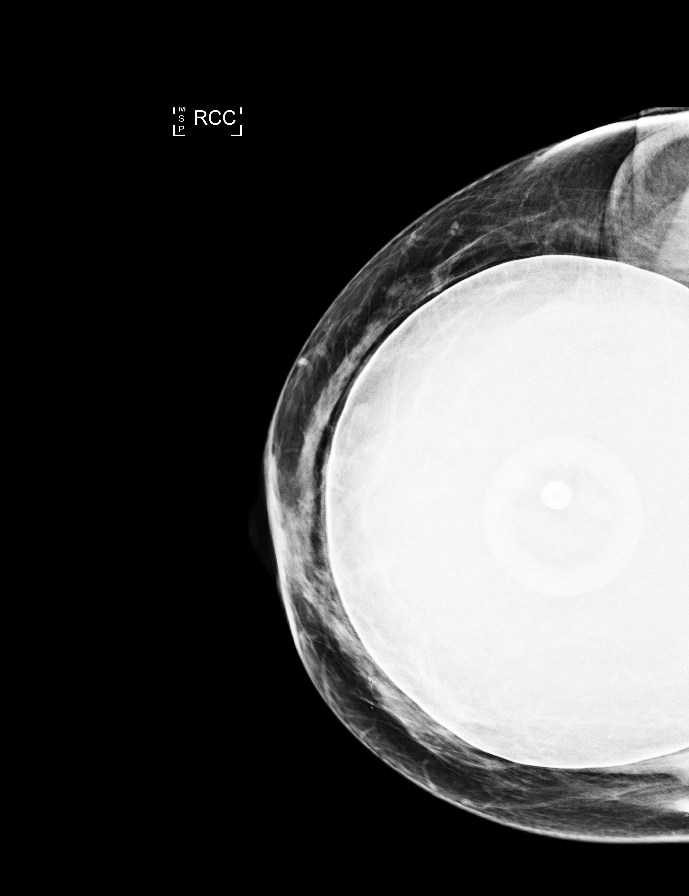

[R MLO (1 of 2)]
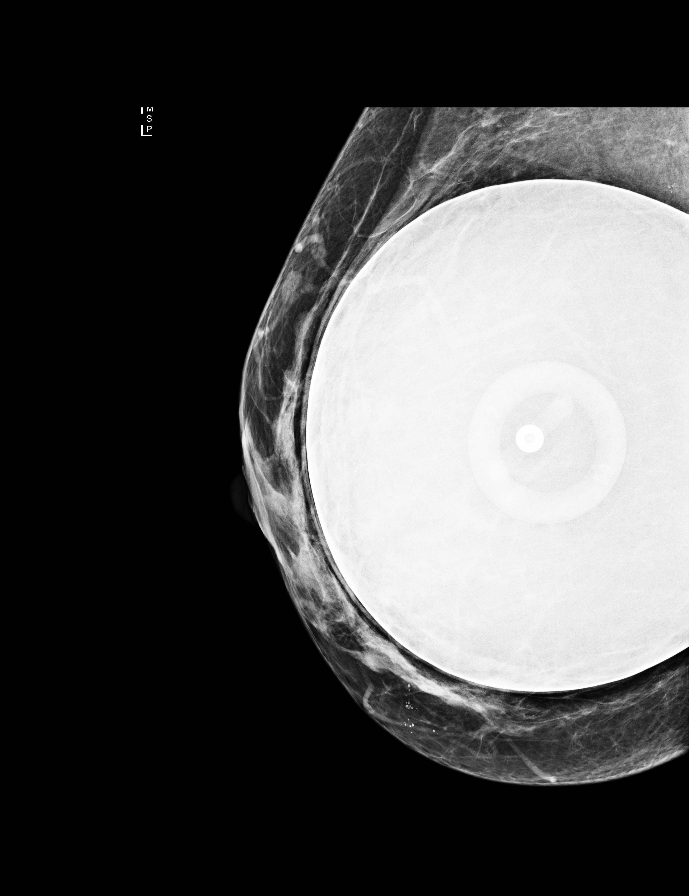

[L MLO (2 of 2)]
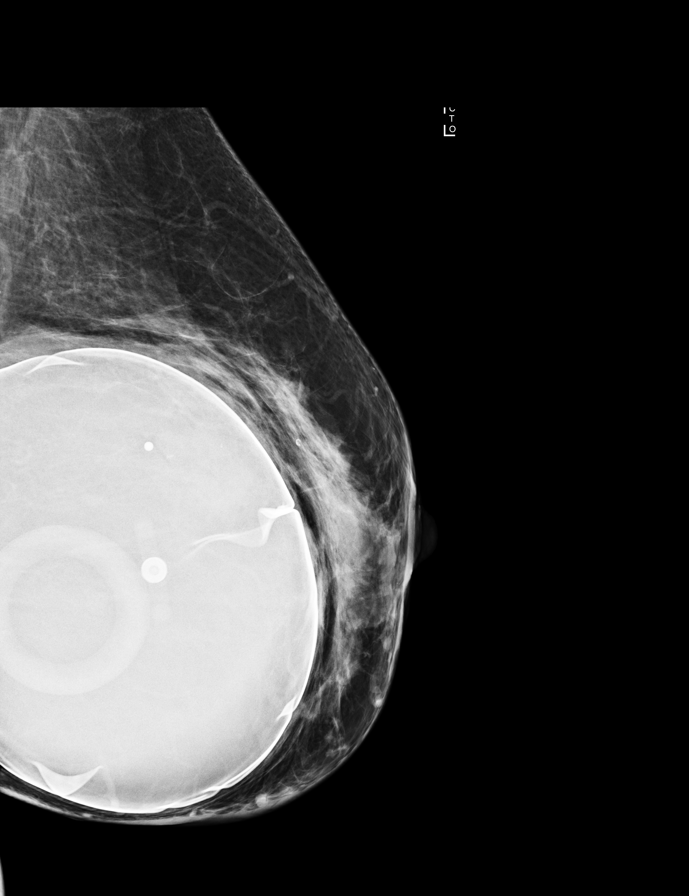

[L CC (2 of 3)]
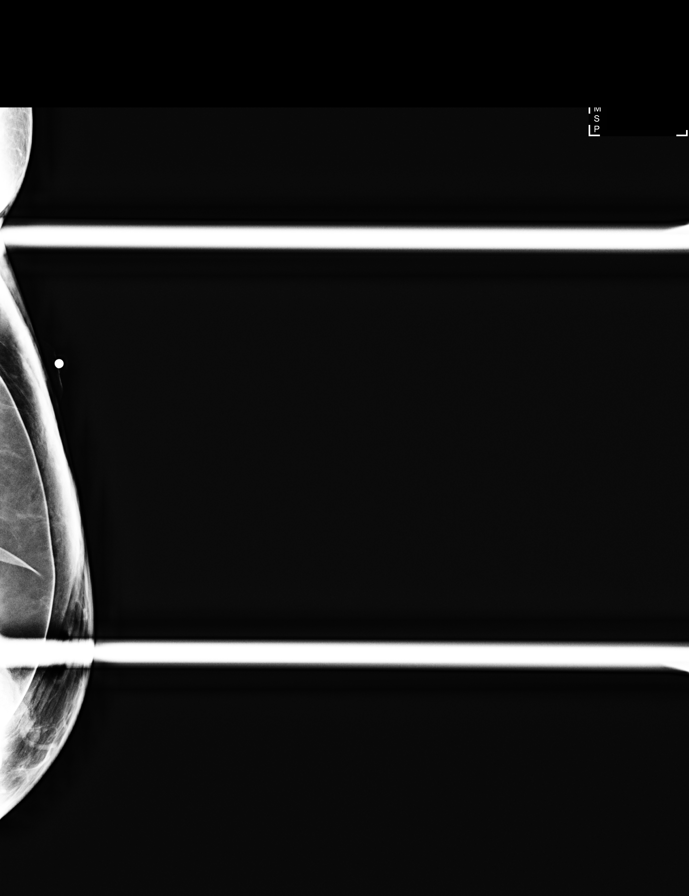

[L CC (3 of 3)]
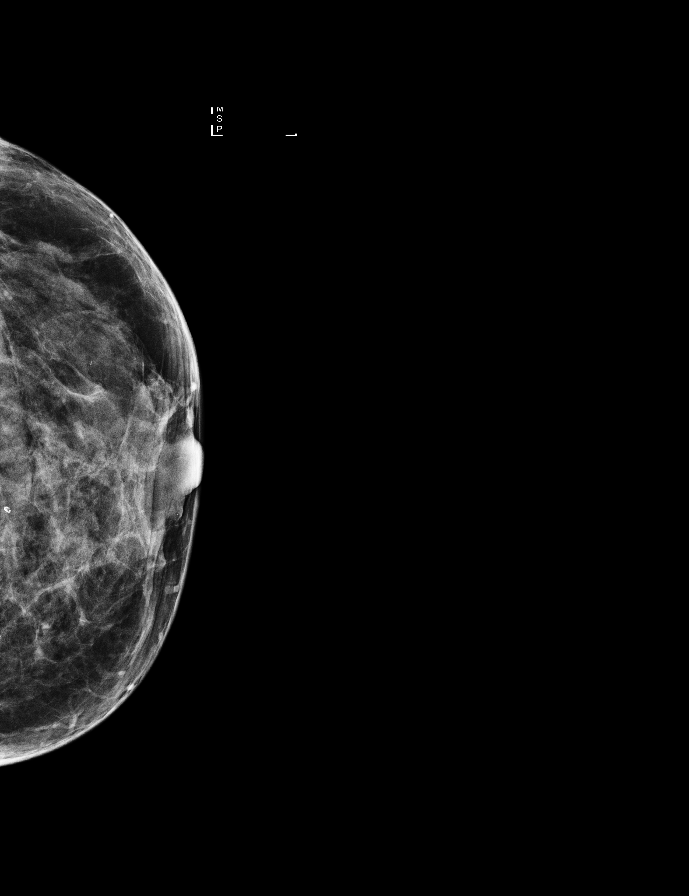

[R MLO (2 of 2)]
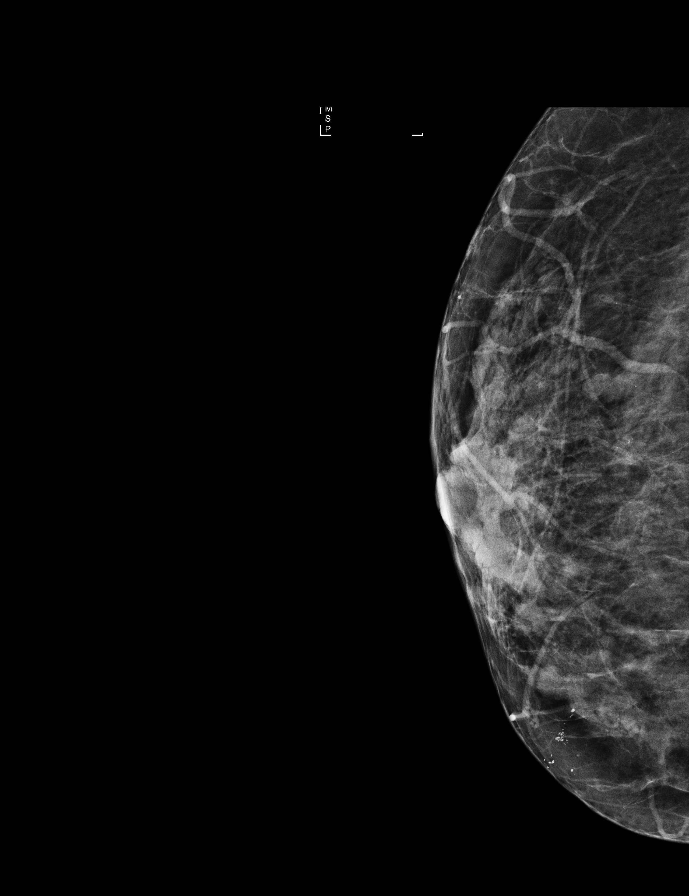

[8 of 35 positions shown; findings below may reference images not displayed]

ACR Breast Density Category c: The breast tissue is heterogeneously
dense, which may obscure small masses.
FINDINGS: No suspicious mass, architectural distortion, or suspicious
microcalcification is identified in either breast to suggest
malignancy.

Mammographic images were processed with CAD.
IMPRESSION: No mammographic evidence of malignancy in either breast.

RECOMMENDATION:
We could not obtain an order for a left breast/axillary ultrasound
today to evaluate the left axilla in this patient with possible left
axillary lymphadenopathy identified on recent clinical exam. Dr.
Popayan would also like the right axillary probable epidermal
inclusion cysts re-evaluated at the time of her ultrasound
appointment as well. The patient will be scheduled for bilateral
axillary ultrasound when the order is obtained.

Bilateral axillary ultrasounds will be performed when both orders
are available.

I have discussed the findings and recommendations with the patient.
If applicable, a reminder letter will be sent to the patient
regarding the next appointment.

BI-RADS CATEGORY  0: Incomplete. Need additional imaging evaluation
and/or prior mammograms for comparison.

## 2020-09-10 ENCOUNTER — Ambulatory Visit: Payer: Medicaid Other | Admitting: General Surgery

## 2020-09-14 ENCOUNTER — Other Ambulatory Visit: Payer: Self-pay | Admitting: Neurology

## 2020-09-14 ENCOUNTER — Other Ambulatory Visit (HOSPITAL_COMMUNITY): Payer: Self-pay | Admitting: Neurology

## 2020-09-14 DIAGNOSIS — R569 Unspecified convulsions: Secondary | ICD-10-CM

## 2020-09-17 ENCOUNTER — Ambulatory Visit (INDEPENDENT_AMBULATORY_CARE_PROVIDER_SITE_OTHER): Payer: Medicaid Other | Admitting: General Surgery

## 2020-09-17 ENCOUNTER — Encounter: Payer: Self-pay | Admitting: General Surgery

## 2020-09-17 ENCOUNTER — Other Ambulatory Visit: Payer: Self-pay

## 2020-09-17 VITALS — BP 116/78 | HR 113 | Temp 99.0°F | Resp 16 | Ht 60.0 in | Wt 128.0 lb

## 2020-09-17 DIAGNOSIS — L723 Sebaceous cyst: Secondary | ICD-10-CM | POA: Diagnosis not present

## 2020-09-17 DIAGNOSIS — L821 Other seborrheic keratosis: Secondary | ICD-10-CM

## 2020-09-17 NOTE — Progress Notes (Signed)
[facility] History and Physical    Chief Complaint    Follow-up      Julie [name] is a 49 y.o. female.  HPI: Julie [name] is a 49 yo who is known to me from the summer and came in with a complaints of a cyst in the right axilla and also a skin lesion on the right side. During her exam she also had an implant on the left and concern for possible adenopathy on the left, so we sent her for her mammogram and US of the axilla to further evaluate.  Her mammogram was negative and the implant was intact. There was no adenopathy and the right axilla had a cyst that was superficial. She then had COVID and had her follow up delayed due to this and returns now to get the lesion in the axilla and the growing skin lesion removed.  The skin lesion on the right flank at the bra line she reports actually was injured and part of it sheared off but it grew back. It looks mostly like a seborrheic keratosis but could be a skin cancer or other skin lesion given her sun and tanning exposure.   Past Medical History:  Diagnosis Date  . Anger   . Anxiety   . Aortic insufficiency 2006   Noted at heart cath - mild to moderate  . Arthritis   . Asthma   . Back pain   . BV (bacterial vaginosis) 05/28/2015  . Chronic diarrhea   . Chronic headache   . Depression   . Diarrhea 05/28/2015  . Dyslipidemia 01/19/2016  . Fibroids, submucosal 10/24/2018  . GERD (gastroesophageal reflux disease)   . History of kidney stones    history of  . History of nuclear stress test 07/15/2008   lexiscan; mild-mod perfusion defect in mid anteroseptal, apical anterior, apical septal regions (attenuation artifiact), prominent gut uptake activity in infero-apical region; post-stress EF 64%; EKG negative for ischemia; patient experienced CP during test  . Hx of cardiac catheterization 2006   no significant CAD  . IBS (irritable bowel syndrome)   . Irritable bowel syndrome   . Nausea 05/28/2015  . Neck pain   .  Numerous moles 01/18/2016  . RLQ abdominal pain 05/28/2015  . Seizures ([place])    2 years ago; unknown etiology and none since then and on no meds  . Vaginal discharge 05/28/2015  . Weight gain 01/18/2016    Past Surgical History:  Procedure Laterality Date  . BIOPSY N/A 02/25/2014   Procedure: BIOPSY;  Surgeon: Rogene Houston, MD;  Location: [facility];  Service: Endoscopy;  Laterality: N/A;  . BREAST ENHANCEMENT SURGERY    . CARDIAC CATHETERIZATION  1025//2006   no significant CAD, mild-mod depressed LV systolic function, EF 27-25%, mild-mod AI (Dr. Gerrie Nordmann)   . CARPAL TUNNEL RELEASE Bilateral 01/01/2016  . CESAREAN SECTION    . COLONOSCOPY  05/27/2011   snare polypectomy   . COLONOSCOPY WITH PROPOFOL N/A 10/21/2016   Procedure: COLONOSCOPY WITH PROPOFOL;  Surgeon: Rogene Houston, MD;  Location: [facility];  Service: Endoscopy;  Laterality: N/A;  10:30  . COLONOSCOPY WITH PROPOFOL N/A 08/02/2019   Procedure: COLONOSCOPY WITH PROPOFOL;  Surgeon: Rogene Houston, MD;  Location: [facility];  Service: Endoscopy;  Laterality: N/A;  7:30  . ESOPHAGOGASTRODUODENOSCOPY N/A 02/25/2014   Procedure: ESOPHAGOGASTRODUODENOSCOPY (EGD);  Surgeon: Rogene Houston, MD;  Location: [facility];  Service: Endoscopy;  Laterality: N/A;  730  .  ESOPHAGOGASTRODUODENOSCOPY (EGD) WITH PROPOFOL N/A 08/02/2019   Procedure: ESOPHAGOGASTRODUODENOSCOPY (EGD) WITH PROPOFOL;  Surgeon: Rogene Houston, MD;  Location: [facility];  Service: Endoscopy;  Laterality: N/A;  . KNEE SURGERY Right   . PARTIAL KNEE ARTHROPLASTY Right 03/07/2018   Procedure: Right knee medial unicompartmental arthroplasty;  Surgeon: Gaynelle Arabian, MD;  Location: [facility];  Service: Orthopedics;  Laterality: Right;  with block  . POLYPECTOMY  08/02/2019   Procedure: POLYPECTOMY;  Surgeon: Rogene Houston, MD;  Location: [facility];  Service: Endoscopy;;  cold snare distal sigmoid  . TRANSTHORACIC ECHOCARDIOGRAM  06/3219   LV systolic  function is low-normal; RV is normal in size & function; mild MR, mild TR  . TUBAL LIGATION      Family History  Problem Relation Age of Onset  . Other Mother        heart problems  . Other Brother        neck and back surgery  . Other Maternal Grandmother        brain tumor  . Leukemia Maternal Grandfather   . Dementia Father     Social History   Tobacco Use  . Smoking status: Current Every Day Smoker    Packs/day: 0.50    Years: 28.00    Pack years: 14.00    Types: Cigarettes    Start date: 10/11/1986  . Smokeless tobacco: Never Used  Vaping Use  . Vaping Use: Some days  . Substances: Nicotine  Substance Use Topics  . Alcohol use: Yes    Alcohol/week: 1.0 standard drink    Types: 1 Glasses of wine per week    Comment: occasionally   . Drug use: Yes    Types: Marijuana    Comment: twice a week    Medications: I have reviewed the patient's current medications. Allergies as of 09/17/2020      Reactions   Dicyclomine Palpitations   Patient states that her heart rate will go real fast when she takes this medication.   Other Other (See Comments)   House dust   Flagyl [metronidazole Hcl] Nausea And Vomiting   Morphine And Related Other (See Comments)   Headache    Penicillins Other (See Comments)   Caused patient to pass out Did it involve swelling of the face/tongue/throat, SOB, or low BP? No Did it involve sudden or severe rash/hives, skin peeling, or any reaction on the inside of your mouth or nose? No Did you need to seek medical attention at a hospital or doctor's office? No When did it last happen?20 + years If all above answers are "NO", may proceed with cephalosporin use.      Medication List       Accurate as of September 17, 2020  3:11 PM. If you have any questions, ask your nurse or doctor.        ARIPiprazole 5 MG tablet Commonly known as: ABILIFY Take 1 tablet (5 mg total) by mouth daily.   aspirin EC 81 MG tablet Take 81 mg by mouth  daily as needed (chest pain).   cetirizine 10 MG tablet Commonly known as: ZYRTEC cetirizine 10 mg tablet   10 mg by oral route.   chlorhexidine 0.12 % solution Commonly known as: PERIDEX SMARTSIG:0.5 Ounce(s) By Mouth Twice Daily   DULoxetine 30 MG capsule Commonly known as: CYMBALTA Total of 90 mg daily (take along with 60 mg)   DULoxetine 60 MG capsule Commonly known as: Cymbalta Take 1 capsule (60 mg  total) by mouth daily.   fluticasone 50 MCG/ACT nasal spray Commonly known as: FLONASE Place 2 sprays into both nostrils daily as needed for allergies.   gabapentin 300 MG capsule Commonly known as: NEURONTIN gabapentin 300 mg capsule  TAKE 1 CAPSULE BY MOUTH THREE TIMES A DAY.   IBU 400 MG tablet Generic drug: ibuprofen Take 400 mg by mouth 4 (four) times daily as needed.   loratadine 10 MG tablet Commonly known as: CLARITIN Take 10 mg by mouth daily.   LORazepam 1 MG tablet Commonly known as: ATIVAN Take 1 tablet (1 mg total) by mouth 2 (two) times daily as needed for anxiety.   Narcan 4 MG/0.1ML Liqd nasal spray kit Generic drug: naloxone Narcan 4 mg/actuation nasal spray  Take by nasal route every 3 minutes until patient awakes or EMS arrives.   ondansetron 8 MG tablet Commonly known as: ZOFRAN TAKE 1 TABLET BY MOUTH EVERY 8 HOURS AS NEEDED FOR NAUSEA AND VOMITING. What changed:   how much to take  how to take this  when to take this  reasons to take this  additional instructions   oxyCODONE-acetaminophen 10-325 MG tablet Commonly known as: PERCOCET Take 1 tablet by mouth every 6 (six) hours as needed for pain.   pantoprazole 40 MG tablet Commonly known as: Protonix Take 1 tablet (40 mg total) by mouth 2 (two) times daily before a meal.   Prempro 0.625-2.5 MG tablet Generic drug: estrogen (conjugated)-medroxyprogesterone TAKE (1) TABLET BY MOUTH ONCE DAILY.   ProAir HFA 108 (90 Base) MCG/ACT inhaler Generic drug: albuterol USE 2 PUFFS  EVERY 6 HOURS AS NEEDED FOR WHEEZING OR SHORTNESS OF BREATH. What changed: See the new instructions.   promethazine 25 MG tablet Commonly known as: PHENERGAN Take 25 mg by mouth every 6 (six) hours as needed for nausea or vomiting.   traZODone 150 MG tablet Commonly known as: DESYREL Take 1 tablet (150 mg total) by mouth at bedtime.   VISINE OP Place 1 drop into both eyes daily as needed (dry / allergy eyes).        ROS:  A comprehensive review of systems was negative except for: right axillary cyst, right flank skin lesion  Blood pressure 116/78, pulse (!) 113, temperature 99 F (37.2 C), temperature source Oral, resp. rate 16, height 5' (1.524 m), weight 128 lb (58.1 kg), SpO2 98 %. Physical Exam Vitals reviewed.  Constitutional:      Appearance: She is normal weight.  HENT:     Head: Normocephalic.     Mouth/Throat:     Mouth: Mucous membranes are moist.  Eyes:     Pupils: Pupils are equal, round, and reactive to light.  Cardiovascular:     Rate and Rhythm: Normal rate and regular rhythm.  Pulmonary:     Effort: Pulmonary effort is normal.  Abdominal:     General: There is no distension.     Palpations: Abdomen is soft.     Tenderness: There is no abdominal tenderness.  Musculoskeletal:        General: Normal range of motion.     Cervical back: Normal range of motion.     Comments: Right axilla tender, superficial superior cyst, non drainage and no erythema, right flank with 1.5cm skin lesion that is scaly and promiment may be seborrhoic keratosis   Skin:    General: Skin is warm and dry.  Neurological:     General: No focal deficit present.     Mental Status: She  is alert and oriented to person, place, and time.  Psychiatric:        Mood and Affect: Mood normal.        Behavior: Behavior normal.        Thought Content: Thought content normal.        Judgment: Judgment normal.     Results: CLINICAL DATA:  49 year old patient presents for annual  mammogram and evaluation of possible left axillary lymphadenopathy palpated on recent clinical physical exam. Known benign epidermal inclusion cysts right axilla.  As a left axillary ultrasound order could not be obtained today, the ultrasound portion of the exam was not performed.  EXAM: DIGITAL DIAGNOSTIC BILATERAL MAMMOGRAM WITH IMPLANTS, CAD AND TOMO  The patient has retropectoral implants. Standard and implant displaced views were performed.  COMPARISON:  Previous exam(s).  ACR Breast Density Category c: The breast tissue is heterogeneously dense, which may obscure small masses.  FINDINGS: No suspicious mass, architectural distortion, or suspicious microcalcification is identified in either breast to suggest malignancy.  Mammographic images were processed with CAD.  IMPRESSION: No mammographic evidence of malignancy in either breast.  RECOMMENDATION: We could not obtain an order for a left breast/axillary ultrasound today to evaluate the left axilla in this patient with possible left axillary lymphadenopathy identified on recent clinical exam. Dr. Constance Haw would also like the right axillary probable epidermal inclusion cysts re-evaluated at the time of her ultrasound appointment as well. The patient will be scheduled for bilateral axillary ultrasound when the order is obtained.  Bilateral axillary ultrasounds will be performed when both orders are available.  I have discussed the findings and recommendations with the patient. If applicable, a reminder letter will be sent to the patient regarding the next appointment.  BI-RADS CATEGORY  0: Incomplete. Need additional imaging evaluation and/or prior mammograms for comparison.   Electronically Signed   By: Curlene Dolphin M.D.   On: 05/19/2020 15:19  CLINICAL DATA:  Patient underwent diagnostic mammography on 05/19/2020 for evaluation of possible left axillary adenopathy palpated by the patient's  referring clinician. Known benign epidermal inclusion cyst in the right axilla. Diagnostic mammography demonstrated no evidence of malignancy. A order for the axillary ultrasound could not be obtained at time.  EXAM: ULTRASOUND OF THE BILATERAL BREAST/AXILLA  COMPARISON:  Prior exams  FINDINGS: On physical exam, in the right axilla there is soft tissue prominence palpated within the skin consistent with the reported inclusion cyst. No mass or lump is palpated in the left axilla.  Targeted left axilla ultrasound is performed, showing a normal size normal morphology lymph node. No mass or enlarged or abnormal lymph nodes.  Targeted ultrasound is performed, showing several cystic structures within the dermis of the right axillary skin consistent with inclusion cyst. There are no underlying masses or enlarged or abnormal lymph nodes.  IMPRESSION: 1. No evidence of breast malignancy. 2. No axillary masses or enlarged or abnormal lymph nodes. 3. Small epidermal inclusion cysts in the right axilla.  RECOMMENDATION: Screening mammogram in one year.(Code:SM-B-01Y)  I have discussed the findings and recommendations with the patient. If applicable, a reminder letter will be sent to the patient regarding the next appointment.  BI-RADS CATEGORY  2: Benign.   Electronically Signed   By: Lajean Manes M.D.   On: 06/02/2020 14:34  Assessment & Plan:  Julie [name] is a 49 y.o. female with a  Right axillary cyst and a skin lesion that need removal. She wants to proceed with this with sedation. She has  a history of atypical chest pain and this was worked up with ECHO and stress test and catheterization that was normal in 2016.  She has no recent CP or SOB.   -Discussed excision of the axillary lesion and skin lesion on the right flank, discussed risk of bleeding, infection, finding something unexpected like cancer, needing additional surgery  - Discussed preop COVID test    All questions were answered to the satisfaction of the patient.    Virl Cagey 09/17/2020, 3:11 PM

## 2020-09-17 NOTE — Patient Instructions (Signed)
Excision of Skin Lesions Excision of a skin lesion is the removal of a section of skin by making small cuts (incisions) in the skin. Through this process, the lesion is completely removed. This procedure is often done to treat or prevent cancer or infection. It may also be done to improve cosmetic appearance. The procedure may be done to remove:  Cancerous (malignant) growths, such as basal cell carcinoma, squamous cell carcinoma, or melanoma.  Noncancerous (benign) growths, such as a cyst or lipoma.  Growths, such as moles or skin tags, which may be removed for cosmetic reasons. Various excision or surgical techniques may be used depending on your condition, the location of the lesion, and your overall health. Tell a health care provider about:  Any allergies you have.  All medicines you are taking, including vitamins, herbs, eye drops, creams, and over-the-counter medicines.  Any problems you or family members have had with anesthetic medicines.  Any blood disorders you have.  Any surgeries you have had.  Any medical conditions you have or have had.  Whether you are pregnant or may be pregnant. What are the risks? Generally, this is a safe procedure. However, problems may occur, including:  Bleeding.  Infection.  Scarring.  Recurrence of the cyst, lipoma, or cancer.  Changes in skin sensation or appearance, such as discoloration or swelling.  Reaction to the anesthetics.  Allergic reaction to surgical materials or ointments.  Damage to nerves, blood vessels, muscles, or other structures.  Continued pain. What happens before the procedure? Medicines Ask your health care provider about:  Changing or stopping your regular medicines. This is especially important if you are taking diabetes medicines or blood thinners.  Taking medicines such as aspirin and ibuprofen. These medicines can thin your blood. Do not take these medicines unless your health care provider tells  you to take them.  Taking over-the-counter medicines, vitamins, herbs, and supplements. General instructions  You may be asked to stop smoking.  You may have an exam or testing.  Ask your health care provider what steps will be taken to help prevent infection. These may include: ? Removing hair at the surgery site. ? Washing skin with a germ-killing soap. ? Taking antibiotic medicine. What happens during the procedure?   You will be given a medicine to numb the area (local anesthetic).  Your health care provider will remove the lesions using one of the following excision techniques. ? Complete surgical excision. This procedure may be done to treat a cancerous growth or a noncancerous cyst or lesion.  A small scalpel or scissors will be used to gently cut around and under the lesion until it is completely removed.  If bleeding occurs, it will be stopped with a device that delivers heat (electrocautery).  The edges of the wound may be stitched (sutured) together.  A bandage (dressing) will be applied.  Samples will be sent to a lab for testing. ? Excision of a cyst.  An incision will be made on the cyst.  The entire cyst will be removed through the incision.  The incision may be closed with sutures. ? Shave excision. This may be done to remove a mole or a skin tag.  A small blade or an electrically heated loop instrument will be used to shave off the lesion.  The wound is usually left to heal on its own without sutures. ? Punch excision. This may be done to remove a mole or a scar or to do a biopsy of the lesion.  A small tool that is like a cookie cutter or a hole punch is used to cut a circle shape out of the skin.  The outer edges of the skin will be sutured together.  The sample may be sent to a lab for testing. ? Mohs micrographic surgery. This is usually done to treat skin cancer. This type of excision is mostly used on the face and ears. This procedure is  minimally invasive, and it ensures the best cosmetic outcome.  A scalpel or a loop instrument will be used to remove layers of the lesion until all the abnormal or cancerous tissue has been removed.  The wound may be sutured, depending on its size.  The tissue will be checked under a microscope right away. Each of the techniques may vary among health care providers and hospitals. At the end of any of these procedures, antibiotic ointment will be applied as needed. What happens after the procedure?  Return to your normal activities as told by your health care provider. Ask your health care provider what activities are safe for you.  It is up to you to get the results of your procedure. Ask your doctor, or the department that is doing the procedure, when your results will be ready.  Talk with your health care provider to discuss any test results, treatment options, and if necessary, the need for more tests.  Keep all follow-up visits as told by your health care provider. This is important. Summary  Excision of a skin lesion is the removal of a section of skin by making small cuts (incisions) in the skin. This procedure is often done to treat or prevent cancer and infection, or it may be done to improve cosmetic appearance.  Various excision or surgical techniques may be used depending on your condition, the location of the lesion, and your overall health.  After the procedure, talk with your health care provider to discuss any test results, treatment options, and if necessary, the need for more tests.  Keep all follow-up visits as told by your health care provider. This is important. This information is not intended to replace advice given to you by your health care provider. Make sure you discuss any questions you have with your health care provider. Document Revised: 05/30/2018 Document Reviewed: 05/30/2018 [employer] Patient Education  [employer].
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
## 2020-09-28 ENCOUNTER — Telehealth: Payer: Self-pay | Admitting: General Surgery

## 2020-09-28 ENCOUNTER — Other Ambulatory Visit: Payer: Self-pay

## 2020-09-28 ENCOUNTER — Ambulatory Visit (HOSPITAL_COMMUNITY)
Admission: RE | Admit: 2020-09-28 | Discharge: 2020-09-28 | Disposition: A | Discharge status: Home / Self Care | Payer: Medicaid Other | Source: Ambulatory Visit | Attending: Neurology | Admitting: Neurology

## 2020-09-28 DIAGNOSIS — R569 Unspecified convulsions: Secondary | ICD-10-CM | POA: Diagnosis present

## 2020-09-28 MED ORDER — GADOBUTROL 1 MMOL/ML IV SOLN
7.0000 mL | Freq: Once | INTRAVENOUS | Status: AC | PRN
  Administered 2020-09-28: 7 mL via INTRAVENOUS

## 2020-09-28 NOTE — Telephone Encounter (Signed)
Called [phone] and left a message for patient to return my call. Surgery has been scheduled for 10-05-2020. Patient has been scheduled for pre-op 10-01-2020 at 10:30am. Pt does not need a COVID test per [facility] protocol. Patient had a positive test on 07-31-2020.

## 2020-09-28 NOTE — H&P (Signed)
[facility] History and Physical    Chief Complaint    Follow-up      Julie [name] is a 49 y.o. female.  HPI: Julie [name] is a 49 yo who is known to me from the summer and came in with a complaints of a cyst in the right axilla and also a skin lesion on the right side. During her exam she also had an implant on the left and concern for possible adenopathy on the left, so we sent her for her mammogram and US of the axilla to further evaluate.  Her mammogram was negative and the implant was intact. There was no adenopathy and the right axilla had a cyst that was superficial. She then had COVID and had her follow up delayed due to this and returns now to get the lesion in the axilla and the growing skin lesion removed.  The skin lesion on the right flank at the bra line she reports actually was injured and part of it sheared off but it grew back. It looks mostly like a seborrheic keratosis but could be a skin cancer or other skin lesion given her sun and tanning exposure.   Past Medical History:  Diagnosis Date  . Anger   . Anxiety   . Aortic insufficiency 2006   Noted at heart cath - mild to moderate  . Arthritis   . Asthma   . Back pain   . BV (bacterial vaginosis) 05/28/2015  . Chronic diarrhea   . Chronic headache   . Depression   . Diarrhea 05/28/2015  . Dyslipidemia 01/19/2016  . Fibroids, submucosal 10/24/2018  . GERD (gastroesophageal reflux disease)   . History of kidney stones    history of  . History of nuclear stress test 07/15/2008   lexiscan; mild-mod perfusion defect in mid anteroseptal, apical anterior, apical septal regions (attenuation artifiact), prominent gut uptake activity in infero-apical region; post-stress EF 64%; EKG negative for ischemia; patient experienced CP during test  . Hx of cardiac catheterization 2006   no significant CAD  . IBS (irritable bowel syndrome)   . Irritable bowel syndrome   . Nausea 05/28/2015  . Neck pain   .  Numerous moles 01/18/2016  . RLQ abdominal pain 05/28/2015  . Seizures (HCC)    2 years ago; unknown etiology and none since then and on no meds  . Vaginal discharge 05/28/2015  . Weight gain 01/18/2016    Past Surgical History:  Procedure Laterality Date  . BIOPSY N/A 02/25/2014   Procedure: BIOPSY;  Surgeon: Najeeb U Rehman, MD;  Location: [facility];  Service: Endoscopy;  Laterality: N/A;  . BREAST ENHANCEMENT SURGERY    . CARDIAC CATHETERIZATION  1025//2006   no significant CAD, mild-mod depressed LV systolic function, EF 35-40%, mild-mod AI (Dr. R. McQueen)   . CARPAL TUNNEL RELEASE Bilateral 01/01/2016  . CESAREAN SECTION    . COLONOSCOPY  05/27/2011   snare polypectomy   . COLONOSCOPY WITH PROPOFOL N/A 10/21/2016   Procedure: COLONOSCOPY WITH PROPOFOL;  Surgeon: Najeeb U Rehman, MD;  Location: [facility];  Service: Endoscopy;  Laterality: N/A;  10:30  . COLONOSCOPY WITH PROPOFOL N/A 08/02/2019   Procedure: COLONOSCOPY WITH PROPOFOL;  Surgeon: Rehman, Najeeb U, MD;  Location: [facility];  Service: Endoscopy;  Laterality: N/A;  7:30  . ESOPHAGOGASTRODUODENOSCOPY N/A 02/25/2014   Procedure: ESOPHAGOGASTRODUODENOSCOPY (EGD);  Surgeon: Najeeb U Rehman, MD;  Location: [facility];  Service: Endoscopy;  Laterality: N/A;  730  .   ESOPHAGOGASTRODUODENOSCOPY (EGD) WITH PROPOFOL N/A 08/02/2019   Procedure: ESOPHAGOGASTRODUODENOSCOPY (EGD) WITH PROPOFOL;  Surgeon: Rehman, Najeeb U, MD;  Location: [facility];  Service: Endoscopy;  Laterality: N/A;  . KNEE SURGERY Right   . PARTIAL KNEE ARTHROPLASTY Right 03/07/2018   Procedure: Right knee medial unicompartmental arthroplasty;  Surgeon: Aluisio, Frank, MD;  Location: [facility];  Service: Orthopedics;  Laterality: Right;  with block  . POLYPECTOMY  08/02/2019   Procedure: POLYPECTOMY;  Surgeon: Rehman, Najeeb U, MD;  Location: [facility];  Service: Endoscopy;;  cold snare distal sigmoid  . TRANSTHORACIC ECHOCARDIOGRAM  07/2008   LV systolic  function is low-normal; RV is normal in size & function; mild MR, mild TR  . TUBAL LIGATION      Family History  Problem Relation Age of Onset  . Other Mother        heart problems  . Other Brother        neck and back surgery  . Other Maternal Grandmother        brain tumor  . Leukemia Maternal Grandfather   . Dementia Father     Social History   Tobacco Use  . Smoking status: Current Every Day Smoker    Packs/day: 0.50    Years: 28.00    Pack years: 14.00    Types: Cigarettes    Start date: 10/11/1986  . Smokeless tobacco: Never Used  Vaping Use  . Vaping Use: Some days  . Substances: Nicotine  Substance Use Topics  . Alcohol use: Yes    Alcohol/week: 1.0 standard drink    Types: 1 Glasses of wine per week    Comment: occasionally   . Drug use: Yes    Types: Marijuana    Comment: twice a week    Medications: I have reviewed the patient's current medications. Allergies as of 09/17/2020      Reactions   Dicyclomine Palpitations   Patient states that her heart rate will go real fast when she takes this medication.   Other Other (See Comments)   House dust   Flagyl [metronidazole Hcl] Nausea And Vomiting   Morphine And Related Other (See Comments)   Headache    Penicillins Other (See Comments)   Caused patient to pass out Did it involve swelling of the face/tongue/throat, SOB, or low BP? No Did it involve sudden or severe rash/hives, skin peeling, or any reaction on the inside of your mouth or nose? No Did you need to seek medical attention at a hospital or doctor's office? No When did it last happen?20 + years If all above answers are "NO", may proceed with cephalosporin use.      Medication List       Accurate as of September 17, 2020  3:11 PM. If you have any questions, ask your nurse or doctor.        ARIPiprazole 5 MG tablet Commonly known as: ABILIFY Take 1 tablet (5 mg total) by mouth daily.   aspirin EC 81 MG tablet Take 81 mg by mouth  daily as needed (chest pain).   cetirizine 10 MG tablet Commonly known as: ZYRTEC cetirizine 10 mg tablet   10 mg by oral route.   chlorhexidine 0.12 % solution Commonly known as: PERIDEX SMARTSIG:0.5 Ounce(s) By Mouth Twice Daily   DULoxetine 30 MG capsule Commonly known as: CYMBALTA Total of 90 mg daily (take along with 60 mg)   DULoxetine 60 MG capsule Commonly known as: Cymbalta Take 1 capsule (60 mg   total) by mouth daily.   fluticasone 50 MCG/ACT nasal spray Commonly known as: FLONASE Place 2 sprays into both nostrils daily as needed for allergies.   gabapentin 300 MG capsule Commonly known as: NEURONTIN gabapentin 300 mg capsule  TAKE 1 CAPSULE BY MOUTH THREE TIMES A DAY.   IBU 400 MG tablet Generic drug: ibuprofen Take 400 mg by mouth 4 (four) times daily as needed.   loratadine 10 MG tablet Commonly known as: CLARITIN Take 10 mg by mouth daily.   LORazepam 1 MG tablet Commonly known as: ATIVAN Take 1 tablet (1 mg total) by mouth 2 (two) times daily as needed for anxiety.   Narcan 4 MG/0.1ML Liqd nasal spray kit Generic drug: naloxone Narcan 4 mg/actuation nasal spray  Take by nasal route every 3 minutes until patient awakes or EMS arrives.   ondansetron 8 MG tablet Commonly known as: ZOFRAN TAKE 1 TABLET BY MOUTH EVERY 8 HOURS AS NEEDED FOR NAUSEA AND VOMITING. What changed:   how much to take  how to take this  when to take this  reasons to take this  additional instructions   oxyCODONE-acetaminophen 10-325 MG tablet Commonly known as: PERCOCET Take 1 tablet by mouth every 6 (six) hours as needed for pain.   pantoprazole 40 MG tablet Commonly known as: Protonix Take 1 tablet (40 mg total) by mouth 2 (two) times daily before a meal.   Prempro 0.625-2.5 MG tablet Generic drug: estrogen (conjugated)-medroxyprogesterone TAKE (1) TABLET BY MOUTH ONCE DAILY.   ProAir HFA 108 (90 Base) MCG/ACT inhaler Generic drug: albuterol USE 2 PUFFS  EVERY 6 HOURS AS NEEDED FOR WHEEZING OR SHORTNESS OF BREATH. What changed: See the new instructions.   promethazine 25 MG tablet Commonly known as: PHENERGAN Take 25 mg by mouth every 6 (six) hours as needed for nausea or vomiting.   traZODone 150 MG tablet Commonly known as: DESYREL Take 1 tablet (150 mg total) by mouth at bedtime.   VISINE OP Place 1 drop into both eyes daily as needed (dry / allergy eyes).        ROS:  A comprehensive review of systems was negative except for: right axillary cyst, right flank skin lesion  Blood pressure 116/78, pulse (!) 113, temperature 99 F (37.2 C), temperature source Oral, resp. rate 16, height 5' (1.524 m), weight 128 lb (58.1 kg), SpO2 98 %. Physical Exam Vitals reviewed.  Constitutional:      Appearance: She is normal weight.  HENT:     Head: Normocephalic.     Mouth/Throat:     Mouth: Mucous membranes are moist.  Eyes:     Pupils: Pupils are equal, round, and reactive to light.  Cardiovascular:     Rate and Rhythm: Normal rate and regular rhythm.  Pulmonary:     Effort: Pulmonary effort is normal.  Abdominal:     General: There is no distension.     Palpations: Abdomen is soft.     Tenderness: There is no abdominal tenderness.  Musculoskeletal:        General: Normal range of motion.     Cervical back: Normal range of motion.     Comments: Right axilla tender, superficial superior cyst, non drainage and no erythema, right flank with 1.5cm skin lesion that is scaly and promiment may be seborrhoic keratosis   Skin:    General: Skin is warm and dry.  Neurological:     General: No focal deficit present.     Mental Status: She   is alert and oriented to person, place, and time.  Psychiatric:        Mood and Affect: Mood normal.        Behavior: Behavior normal.        Thought Content: Thought content normal.        Judgment: Judgment normal.     Results: CLINICAL DATA:  49-year-old patient presents for annual  mammogram and evaluation of possible left axillary lymphadenopathy palpated on recent clinical physical exam. Known benign epidermal inclusion cysts right axilla.  As a left axillary ultrasound order could not be obtained today, the ultrasound portion of the exam was not performed.  EXAM: DIGITAL DIAGNOSTIC BILATERAL MAMMOGRAM WITH IMPLANTS, CAD AND TOMO  The patient has retropectoral implants. Standard and implant displaced views were performed.  COMPARISON:  Previous exam(s).  ACR Breast Density Category c: The breast tissue is heterogeneously dense, which may obscure small masses.  FINDINGS: No suspicious mass, architectural distortion, or suspicious microcalcification is identified in either breast to suggest malignancy.  Mammographic images were processed with CAD.  IMPRESSION: No mammographic evidence of malignancy in either breast.  RECOMMENDATION: We could not obtain an order for a left breast/axillary ultrasound today to evaluate the left axilla in this patient with possible left axillary lymphadenopathy identified on recent clinical exam. Dr. Aiya Keach would also like the right axillary probable epidermal inclusion cysts re-evaluated at the time of her ultrasound appointment as well. The patient will be scheduled for bilateral axillary ultrasound when the order is obtained.  Bilateral axillary ultrasounds will be performed when both orders are available.  I have discussed the findings and recommendations with the patient. If applicable, a reminder letter will be sent to the patient regarding the next appointment.  BI-RADS CATEGORY  0: Incomplete. Need additional imaging evaluation and/or prior mammograms for comparison.   Electronically Signed   By: Susan  Turner M.D.   On: 05/19/2020 15:19  CLINICAL DATA:  Patient underwent diagnostic mammography on 05/19/2020 for evaluation of possible left axillary adenopathy palpated by the patient's  referring clinician. Known benign epidermal inclusion cyst in the right axilla. Diagnostic mammography demonstrated no evidence of malignancy. A order for the axillary ultrasound could not be obtained at time.  EXAM: ULTRASOUND OF THE BILATERAL BREAST/AXILLA  COMPARISON:  Prior exams  FINDINGS: On physical exam, in the right axilla there is soft tissue prominence palpated within the skin consistent with the reported inclusion cyst. No mass or lump is palpated in the left axilla.  Targeted left axilla ultrasound is performed, showing a normal size normal morphology lymph node. No mass or enlarged or abnormal lymph nodes.  Targeted ultrasound is performed, showing several cystic structures within the dermis of the right axillary skin consistent with inclusion cyst. There are no underlying masses or enlarged or abnormal lymph nodes.  IMPRESSION: 1. No evidence of breast malignancy. 2. No axillary masses or enlarged or abnormal lymph nodes. 3. Small epidermal inclusion cysts in the right axilla.  RECOMMENDATION: Screening mammogram in one year.(Code:[id number]-B-01Y)  I have discussed the findings and recommendations with the patient. If applicable, a reminder letter will be sent to the patient regarding the next appointment.  BI-RADS CATEGORY  2: Benign.   Electronically Signed   By: David  Ormond M.D.   On: 06/02/2020 14:34  Assessment & Plan:  Julie [name] is a 49 y.o. female with a  Right axillary cyst and a skin lesion that need removal. She wants to proceed with this with sedation. She has   a history of atypical chest pain and this was worked up with ECHO and stress test and catheterization that was normal in 2016.  She has no recent CP or SOB.   -Discussed excision of the axillary lesion and skin lesion on the right flank, discussed risk of bleeding, infection, finding something unexpected like cancer, needing additional surgery  - Discussed preop COVID test    All questions were answered to the satisfaction of the patient.    Julie [name] 09/17/2020, 3:11 PM       

## 2020-09-29 ENCOUNTER — Telehealth (INDEPENDENT_AMBULATORY_CARE_PROVIDER_SITE_OTHER): Payer: Medicaid Other | Admitting: Psychiatry

## 2020-09-29 ENCOUNTER — Telehealth (HOSPITAL_COMMUNITY): Payer: Medicaid Other | Admitting: Psychiatry

## 2020-09-29 ENCOUNTER — Encounter (HOSPITAL_COMMUNITY): Payer: Self-pay | Admitting: Psychiatry

## 2020-09-29 DIAGNOSIS — F4310 Post-traumatic stress disorder, unspecified: Secondary | ICD-10-CM | POA: Diagnosis not present

## 2020-09-29 DIAGNOSIS — F330 Major depressive disorder, recurrent, mild: Secondary | ICD-10-CM | POA: Diagnosis not present

## 2020-09-29 MED ORDER — DULOXETINE HCL 30 MG PO CPEP: ORAL_CAPSULE | ORAL | 0 refills | Status: DC

## 2020-09-29 MED ORDER — ARIPIPRAZOLE 10 MG PO TABS: 10.0000 mg | ORAL_TABLET | Freq: Every day | ORAL | 1 refills | Status: DC

## 2020-09-29 MED ORDER — DULOXETINE HCL 60 MG PO CPEP: 60.0000 mg | ORAL_CAPSULE | Freq: Every day | ORAL | 0 refills | Status: DC

## 2020-09-29 MED ORDER — TRAZODONE HCL 150 MG PO TABS: 150.0000 mg | ORAL_TABLET | Freq: Every day | ORAL | 0 refills | Status: DC

## 2020-09-29 NOTE — Telephone Encounter (Signed)
Left another message for patient to return call to discuss surgery that has been scheduled.

## 2020-09-29 NOTE — Patient Instructions (Signed)
1.Continueduloxetine90 mg daily  2. IncreaseAbilify 10mg  daily 3.Continue lorazepam 1 mg twice a day as needed for anxiety  4.ContinueTrazodone100mg  at night as needed for sleep 5. Next appointment: 12/7 at 1 20

## 2020-09-29 NOTE — Patient Instructions (Signed)
Julie [name]  09/29/2020     @PREFPERIOPPHARMACY @   Your procedure is scheduled on  10/05/2020.  Report to [facility] at  0730  A.M.  Call this number if you have problems the morning of surgery:  [phone]   Remember:  Do not eat or drink after midnight.                         Take these medicines the morning of surgery with A SIP OF WATER  Abilify, cymbalta, gabapentin, claritin, ativan(if needed), protonix, oxycodone(if needed), lyrica. Use your inhaler before you come.    Do not wear jewelry, make-up or nail polish.  Do not wear lotions, powders, or perfumes. Please use deodorant and brush your teeth.  Do not shave 48 hours prior to surgery.  Men may shave face and neck.  Do not bring valuables to the hospital.  [facility] is not responsible for any belongings or valuables.  Contacts, dentures or bridgework may not be worn into surgery.  Leave your suitcase in the car.  After surgery it may be brought to your room.  For patients admitted to the hospital, discharge time will be determined by your treatment team.  Patients discharged the day of surgery will not be allowed to drive home.   Name and phone number of your driver:   family Special instructions:   DO NOT smoke the morning of your procedure.  Please read over the following fact sheets that you were given. Anesthesia Post-op Instructions and Care and Recovery After Surgery       Epidermal Cyst Removal, Care After This sheet gives you information about how to care for yourself after your procedure. Your health care provider may also give you more specific instructions. If you have problems or questions, contact your health care provider. What can I expect after the procedure? After the procedure, it is common to have:  Soreness in the area where your cyst was removed.  Tightness or itchiness from the stitches (sutures) in your skin. Follow these instructions at home: Medicines  Take  over-the-counter and prescription medicines only as told by your health care provider.  If you were prescribed an antibiotic medicine or ointment, take or apply it as told by your health care provider. Do not stop using the antibiotic even if you start to feel better. Incision care   Follow instructions from your health care provider about how to take care of your incision. Make sure you: ? Wash your hands with soap and water before you change your bandage (dressing). If soap and water are not available, use hand sanitizer. ? Change your dressing as told by your health care provider. ? Leave sutures, skin glue, or adhesive strips in place. These skin closures may need to stay in place for 1-2 weeks or longer. If adhesive strip edges start to loosen and curl up, you may trim the loose edges. Do not remove adhesive strips completely unless your health care provider tells you to do that.  Keep the dressingdry until your health care provider says that it can be removed.  After your dressing is off, check your incision area every day for signs of infection. Check for: ? Redness, swelling, or pain. ? Fluid or blood. ? Warmth. ? Pus or a bad smell. General instructions  Do not take baths, swim, or use a hot tub until your health care provider approves.  Ask your health care provider if you may take showers. You may only be allowed to take sponge baths.  Your health care provider may ask you to avoid contact sports or activities that take a lot of effort. Do not do anything that stretches or puts pressure on your incision.  You can return to your normal diet.  Keep all follow-up visits as told by your health care provider. This is important. Contact a health care provider if:  You have a fever.  You have redness, swelling, or pain in the incision area.  You have fluid or blood coming from your incision.  You have pus or a bad smell coming from your incision.  Your incision feels warm to  the touch.  Your cyst grows back. Summary  After the procedure, it is common to have soreness in the area where your cyst was removed.  Take or apply over-the-counter and prescription medicines only as told by your health care provider.  Follow instructions from your health care provider about how to take care of your incision. This information is not intended to replace advice given to you by your health care provider. Make sure you discuss any questions you have with your health care provider. Document Revised: 03/13/2018 Document Reviewed: 09/14/2017 Elsevier Patient Education  2020 [place] [employer] These instructions provide you with information about caring for yourself after your procedure. Your health care provider may also give you more specific instructions. Your treatment has been planned according to current medical practices, but problems sometimes occur. Call your health care provider if you have any problems or questions after your procedure. What can I expect after the procedure? After your procedure, you may:  Feel sleepy for several hours.  Feel clumsy and have poor balance for several hours.  Feel forgetful about what happened after the procedure.  Have poor judgment for several hours.  Feel nauseous or vomit.  Have a sore throat if you had a breathing tube during the procedure. Follow these instructions at home: For at least 24 hours after the procedure:      Have a responsible adult stay with you. It is important to have someone help care for you until you are awake and alert.  Rest as needed.  Do not: ? Participate in activities in which you could fall or become injured. ? Drive. ? Use heavy machinery. ? Drink alcohol. ? Take sleeping pills or medicines that cause drowsiness. ? Make important decisions or sign legal documents. ? Take care of children on your own. Eating and drinking  Follow the diet that is  recommended by your health care provider.  If you vomit, drink water, juice, or soup when you can drink without vomiting.  Make sure you have little or no nausea before eating solid foods. General instructions  Take over-the-counter and prescription medicines only as told by your health care provider.  If you have sleep apnea, surgery and certain medicines can increase your risk for breathing problems. Follow instructions from your health care provider about wearing your sleep device: ? Anytime you are sleeping, including during daytime naps. ? While taking prescription pain medicines, sleeping medicines, or medicines that make you drowsy.  If you smoke, do not smoke without supervision.  Keep all follow-up visits as told by your health care provider. This is important. Contact a health care provider if:  You keep feeling nauseous or you keep vomiting.  You feel light-headed.  You develop a  rash.  You have a fever. Get help right away if:  You have trouble breathing. Summary  For several hours after your procedure, you may feel sleepy and have poor judgment.  Have a responsible adult stay with you for at least 24 hours or until you are awake and alert. This information is not intended to replace advice given to you by your health care provider. Make sure you discuss any questions you have with your health care provider. Document Revised: 02/19/2018 Document Reviewed: 03/13/2016 Elsevier Patient Education  [place]. How to Use Chlorhexidine for Bathing Chlorhexidine gluconate (CHG) is a germ-killing (antiseptic) solution that is used to clean the skin. It can get rid of the bacteria that normally live on the skin and can keep them away for about 24 hours. To clean your skin with CHG, you may be given:  A CHG solution to use in the shower or as part of a sponge bath.  A prepackaged cloth that contains CHG. Cleaning your skin with CHG may help lower the risk for  infection:  While you are staying in the intensive care unit of the hospital.  If you have a vascular access, such as a central line, to provide short-term or long-term access to your veins.  If you have a catheter to drain urine from your bladder.  If you are on a ventilator. A ventilator is a machine that helps you breathe by moving air in and out of your lungs.  After surgery. What are the risks? Risks of using CHG include:  A skin reaction.  Hearing loss, if CHG gets in your ears.  Eye injury, if CHG gets in your eyes and is not rinsed out.  The CHG product catching fire. Make sure that you avoid smoking and flames after applying CHG to your skin. Do not use CHG:  If you have a chlorhexidine allergy or have previously reacted to chlorhexidine.  On babies younger than 52 months of age. How to use CHG solution  Use CHG only as told by your health care provider, and follow the instructions on the label.  Use the full amount of CHG as directed. Usually, this is one bottle. During a shower Follow these steps when using CHG solution during a shower (unless your health care provider gives you different instructions): 1. Start the shower. 2. Use your normal soap and shampoo to wash your face and hair. 3. Turn off the shower or move out of the shower stream. 4. Pour the CHG onto a clean washcloth. Do not use any type of brush or rough-edged sponge. 5. Starting at your neck, lather your body down to your toes. Make sure you follow these instructions: ? If you will be having surgery, pay special attention to the part of your body where you will be having surgery. Scrub this area for at least 1 minute. ? Do not use CHG on your head or face. If the solution gets into your ears or eyes, rinse them well with water. ? Avoid your genital area. ? Avoid any areas of skin that have broken skin, cuts, or scrapes. ? Scrub your back and under your arms. Make sure to wash skin folds. 6. Let  the lather sit on your skin for 1-2 minutes or as long as told by your health care provider. 7. Thoroughly rinse your entire body in the shower. Make sure that all body creases and crevices are rinsed well. 8. Dry off with a clean towel. Do not  put any substances on your body afterward--such as powder, lotion, or perfume--unless you are told to do so by your health care provider. Only use lotions that are recommended by the manufacturer. 9. Put on clean clothes or pajamas. 10. If it is the night before your surgery, sleep in clean sheets.  During a sponge bath Follow these steps when using CHG solution during a sponge bath (unless your health care provider gives you different instructions): 1. Use your normal soap and shampoo to wash your face and hair. 2. Pour the CHG onto a clean washcloth. 3. Starting at your neck, lather your body down to your toes. Make sure you follow these instructions: ? If you will be having surgery, pay special attention to the part of your body where you will be having surgery. Scrub this area for at least 1 minute. ? Do not use CHG on your head or face. If the solution gets into your ears or eyes, rinse them well with water. ? Avoid your genital area. ? Avoid any areas of skin that have broken skin, cuts, or scrapes. ? Scrub your back and under your arms. Make sure to wash skin folds. 4. Let the lather sit on your skin for 1-2 minutes or as long as told by your health care provider. 5. Using a different clean, wet washcloth, thoroughly rinse your entire body. Make sure that all body creases and crevices are rinsed well. 6. Dry off with a clean towel. Do not put any substances on your body afterward--such as powder, lotion, or perfume--unless you are told to do so by your health care provider. Only use lotions that are recommended by the manufacturer. 7. Put on clean clothes or pajamas. 8. If it is the night before your surgery, sleep in clean sheets. How to use CHG  prepackaged cloths  Only use CHG cloths as told by your health care provider, and follow the instructions on the label.  Use the CHG cloth on clean, dry skin.  Do not use the CHG cloth on your head or face unless your health care provider tells you to.  When washing with the CHG cloth: ? Avoid your genital area. ? Avoid any areas of skin that have broken skin, cuts, or scrapes. Before surgery Follow these steps when using a CHG cloth to clean before surgery (unless your health care provider gives you different instructions): 1. Using the CHG cloth, vigorously scrub the part of your body where you will be having surgery. Scrub using a back-and-forth motion for 3 minutes. The area on your body should be completely wet with CHG when you are done scrubbing. 2. Do not rinse. Discard the cloth and let the area air-dry. Do not put any substances on the area afterward, such as powder, lotion, or perfume. 3. Put on clean clothes or pajamas. 4. If it is the night before your surgery, sleep in clean sheets.  For general bathing Follow these steps when using CHG cloths for general bathing (unless your health care provider gives you different instructions). 1. Use a separate CHG cloth for each area of your body. Make sure you wash between any folds of skin and between your fingers and toes. Wash your body in the following order, switching to a new cloth after each step: ? The front of your neck, shoulders, and chest. ? Both of your arms, under your arms, and your hands. ? Your stomach and groin area, avoiding the genitals. ? Your right leg and foot. ?  Your left leg and foot. ? The back of your neck, your back, and your buttocks. 2. Do not rinse. Discard the cloth and let the area air-dry. Do not put any substances on your body afterward--such as powder, lotion, or perfume--unless you are told to do so by your health care provider. Only use lotions that are recommended by the manufacturer. 3. Put on  clean clothes or pajamas. Contact a health care provider if:  Your skin gets irritated after scrubbing.  You have questions about using your solution or cloth. Get help right away if:  Your eyes become very red or swollen.  Your eyes itch badly.  Your skin itches badly and is red or swollen.  Your hearing changes.  You have trouble seeing.  You have swelling or tingling in your mouth or throat.  You have trouble breathing.  You swallow any chlorhexidine. Summary  Chlorhexidine gluconate (CHG) is a germ-killing (antiseptic) solution that is used to clean the skin. Cleaning your skin with CHG may help to lower your risk for infection.  You may be given CHG to use for bathing. It may be in a bottle or in a prepackaged cloth to use on your skin. Carefully follow your health care provider's instructions and the instructions on the product label.  Do not use CHG if you have a chlorhexidine allergy.  Contact your health care provider if your skin gets irritated after scrubbing. This information is not intended to replace advice given to you by your health care provider. Make sure you discuss any questions you have with your health care provider. Document Revised: 02/07/2019 Document Reviewed: 10/19/2017 Elsevier Patient Education  [place].

## 2020-10-01 ENCOUNTER — Encounter (HOSPITAL_COMMUNITY)
Admission: RE | Admit: 2020-10-01 | Discharge: 2020-10-01 | Disposition: A | Discharge status: Home / Self Care | Payer: Medicaid Other | Source: Ambulatory Visit | Attending: General Surgery | Admitting: General Surgery

## 2020-10-05 DIAGNOSIS — L723 Sebaceous cyst: Secondary | ICD-10-CM | POA: Diagnosis present

## 2020-10-05 DIAGNOSIS — L821 Other seborrheic keratosis: Secondary | ICD-10-CM | POA: Diagnosis present

## 2020-10-14 ENCOUNTER — Encounter (HOSPITAL_COMMUNITY): Payer: Medicaid Other

## 2020-10-16 DIAGNOSIS — L821 Other seborrheic keratosis: Secondary | ICD-10-CM | POA: Diagnosis present

## 2020-10-16 DIAGNOSIS — L723 Sebaceous cyst: Secondary | ICD-10-CM | POA: Diagnosis present

## 2020-10-19 NOTE — Patient Instructions (Signed)
Julie [name]  10/19/2020     @PREFPERIOPPHARMACY @   Your procedure is scheduled on  10/21/2020.  Report to [facility] at  [email or web address].  Call this number if you have problems the morning of surgery:  [phone]   Remember:  Do not eat or drink after midnight.                         Take these medicines the morning of surgery with A SIP OF WATER  Abilify, cymbalta, gabapentin, claritin, ativan(if needed), oxycodone(if needed), protonix, lyrica. Use your inhaler before you come and bring your rescue inhaler with you.    Do not wear jewelry, make-up or nail polish.  Do not wear lotions, powders, or perfumes, or deodorant. Please brush your teeth.  Do not shave 48 hours prior to surgery.  Men may shave face and neck.  Do not bring valuables to the hospital.  [facility] is not responsible for any belongings or valuables.  Contacts, dentures or bridgework may not be worn into surgery.  Leave your suitcase in the car.  After surgery it may be brought to your room.  For patients admitted to the hospital, discharge time will be determined by your treatment team.  Patients discharged the day of surgery will not be allowed to drive home.   Name and phone number of your driver:   family Special instructions:   DO  NOT smoke the morning of your procedure.  Please read over the following fact sheets that you were given. Anesthesia Post-op Instructions and Care and Recovery After Surgery       Excision of Skin Lesions, Care After This sheet gives you information about how to care for yourself after your procedure. Your health care provider may also give you more specific instructions. If you have problems or questions, contact your health care provider. What can I expect after the procedure? After your procedure, it is common to have pain or discomfort at the excision site. Follow these instructions at home: Excision care   Follow instructions from your health care  provider about how to take care of your excision site. Make sure you: ? Wash your hands with soap and water before and after you change your bandage (dressing). If soap and water are not available, use hand sanitizer. ? Change your dressing as told by your health care provider. ? Leave stitches (sutures), skin glue, or adhesive strips in place. These skin closures may need to stay in place for 2 weeks or longer. If adhesive strip edges start to loosen and curl up, you may trim the loose edges. Do not remove adhesive strips completely unless your health care provider tells you to do that.  Check the excision area every day for signs of infection. Watch for: ? Redness, swelling, or pain. ? Fluid or blood. ? Warmth. ? Pus or a bad smell.  Keep the site clean, dry, and protected for at least 48 hours.  For bleeding, apply gentle but firm pressure to the area using a folded towel for 20 minutes.  Avoid high-impact exercise and activities until the sutures are removed or the area heals. General instructions  Take over-the-counter and prescription medicines only as told by your health care provider.  Follow instructions from your health care provider about how to minimize scarring. Scarring should lessen over time.  Avoid sun exposure until the area has healed. Use sunscreen  to protect the area from the sun after it has healed.  Keep all follow-up visits as told by your health care provider. This is important. Contact a health care provider if:  You have redness, swelling, or pain around your excision site.  You have fluid or blood coming from your excision site.  Your excision site feels warm to the touch.  You have pus or a bad smell coming from your excision site.  You have a fever.  You have pain that does not improve in 2-3 days after your procedure.  You notice skin irregularities or changes in how you feel (sensation). Summary  This sheet of instructions provides you with  information about caring for yourself after your procedure. Contact your health care provider if you have any problems or questions.  Take over-the-counter and prescription medicines only as told by your health care provider.  Change your dressing as told by your health care provider.  Contact a health care provider if you have redness, swelling, pain, or other signs of infection around your excision site.  Keep all follow-up visits as told by your health care provider. This is important. This information is not intended to replace advice given to you by your health care provider. Make sure you discuss any questions you have with your health care provider. Document Revised: 05/30/2018 Document Reviewed: 05/30/2018 Elsevier Patient Education  2020 [place] After These instructions provide you with information about caring for yourself after your procedure. Your health care provider may also give you more specific instructions. Your treatment has been planned according to current medical practices, but problems sometimes occur. Call your health care provider if you have any problems or questions after your procedure. What can I expect after the procedure? After your procedure, you may:  Feel sleepy for several hours.  Feel clumsy and have poor balance for several hours.  Feel forgetful about what happened after the procedure.  Have poor judgment for several hours.  Feel nauseous or vomit.  Have a sore throat if you had a breathing tube during the procedure. Follow these instructions at home: For at least 24 hours after the procedure:      Have a responsible adult stay with you. It is important to have someone help care for you until you are awake and alert.  Rest as needed.  Do not: ? Participate in activities in which you could fall or become injured. ? Drive. ? Use heavy machinery. ? Drink alcohol. ? Take sleeping pills or medicines  that cause drowsiness. ? Make important decisions or sign legal documents. ? Take care of children on your own. Eating and drinking  Follow the diet that is recommended by your health care provider.  If you vomit, drink water, juice, or soup when you can drink without vomiting.  Make sure you have little or no nausea before eating solid foods. General instructions  Take over-the-counter and prescription medicines only as told by your health care provider.  If you have sleep apnea, surgery and certain medicines can increase your risk for breathing problems. Follow instructions from your health care provider about wearing your sleep device: ? Anytime you are sleeping, including during daytime naps. ? While taking prescription pain medicines, sleeping medicines, or medicines that make you drowsy.  If you smoke, do not smoke without supervision.  Keep all follow-up visits as told by your health care provider. This is important. Contact a health care provider if:  You  keep feeling nauseous or you keep vomiting.  You feel light-headed.  You develop a rash.  You have a fever. Get help right away if:  You have trouble breathing. Summary  For several hours after your procedure, you may feel sleepy and have poor judgment.  Have a responsible adult stay with you for at least 24 hours or until you are awake and alert. This information is not intended to replace advice given to you by your health care provider. Make sure you discuss any questions you have with your health care provider. Document Revised: 02/19/2018 Document Reviewed: 03/13/2016 Elsevier Patient Education  [place]. How to Use Chlorhexidine for Bathing Chlorhexidine gluconate (CHG) is a germ-killing (antiseptic) solution that is used to clean the skin. It can get rid of the bacteria that normally live on the skin and can keep them away for about 24 hours. To clean your skin with CHG, you may be given:  A CHG  solution to use in the shower or as part of a sponge bath.  A prepackaged cloth that contains CHG. Cleaning your skin with CHG may help lower the risk for infection:  While you are staying in the intensive care unit of the hospital.  If you have a vascular access, such as a central line, to provide short-term or long-term access to your veins.  If you have a catheter to drain urine from your bladder.  If you are on a ventilator. A ventilator is a machine that helps you breathe by moving air in and out of your lungs.  After surgery. What are the risks? Risks of using CHG include:  A skin reaction.  Hearing loss, if CHG gets in your ears.  Eye injury, if CHG gets in your eyes and is not rinsed out.  The CHG product catching fire. Make sure that you avoid smoking and flames after applying CHG to your skin. Do not use CHG:  If you have a chlorhexidine allergy or have previously reacted to chlorhexidine.  On babies younger than 38 months of age. How to use CHG solution  Use CHG only as told by your health care provider, and follow the instructions on the label.  Use the full amount of CHG as directed. Usually, this is one bottle. During a shower Follow these steps when using CHG solution during a shower (unless your health care provider gives you different instructions): 1. Start the shower. 2. Use your normal soap and shampoo to wash your face and hair. 3. Turn off the shower or move out of the shower stream. 4. Pour the CHG onto a clean washcloth. Do not use any type of brush or rough-edged sponge. 5. Starting at your neck, lather your body down to your toes. Make sure you follow these instructions: ? If you will be having surgery, pay special attention to the part of your body where you will be having surgery. Scrub this area for at least 1 minute. ? Do not use CHG on your head or face. If the solution gets into your ears or eyes, rinse them well with water. ? Avoid your  genital area. ? Avoid any areas of skin that have broken skin, cuts, or scrapes. ? Scrub your back and under your arms. Make sure to wash skin folds. 6. Let the lather sit on your skin for 1-2 minutes or as long as told by your health care provider. 7. Thoroughly rinse your entire body in the shower. Make sure that all body  creases and crevices are rinsed well. 8. Dry off with a clean towel. Do not put any substances on your body afterward--such as powder, lotion, or perfume--unless you are told to do so by your health care provider. Only use lotions that are recommended by the manufacturer. 9. Put on clean clothes or pajamas. 10. If it is the night before your surgery, sleep in clean sheets.  During a sponge bath Follow these steps when using CHG solution during a sponge bath (unless your health care provider gives you different instructions): 1. Use your normal soap and shampoo to wash your face and hair. 2. Pour the CHG onto a clean washcloth. 3. Starting at your neck, lather your body down to your toes. Make sure you follow these instructions: ? If you will be having surgery, pay special attention to the part of your body where you will be having surgery. Scrub this area for at least 1 minute. ? Do not use CHG on your head or face. If the solution gets into your ears or eyes, rinse them well with water. ? Avoid your genital area. ? Avoid any areas of skin that have broken skin, cuts, or scrapes. ? Scrub your back and under your arms. Make sure to wash skin folds. 4. Let the lather sit on your skin for 1-2 minutes or as long as told by your health care provider. 5. Using a different clean, wet washcloth, thoroughly rinse your entire body. Make sure that all body creases and crevices are rinsed well. 6. Dry off with a clean towel. Do not put any substances on your body afterward--such as powder, lotion, or perfume--unless you are told to do so by your health care provider. Only use lotions  that are recommended by the manufacturer. 7. Put on clean clothes or pajamas. 8. If it is the night before your surgery, sleep in clean sheets. How to use CHG prepackaged cloths  Only use CHG cloths as told by your health care provider, and follow the instructions on the label.  Use the CHG cloth on clean, dry skin.  Do not use the CHG cloth on your head or face unless your health care provider tells you to.  When washing with the CHG cloth: ? Avoid your genital area. ? Avoid any areas of skin that have broken skin, cuts, or scrapes. Before surgery Follow these steps when using a CHG cloth to clean before surgery (unless your health care provider gives you different instructions): 1. Using the CHG cloth, vigorously scrub the part of your body where you will be having surgery. Scrub using a back-and-forth motion for 3 minutes. The area on your body should be completely wet with CHG when you are done scrubbing. 2. Do not rinse. Discard the cloth and let the area air-dry. Do not put any substances on the area afterward, such as powder, lotion, or perfume. 3. Put on clean clothes or pajamas. 4. If it is the night before your surgery, sleep in clean sheets.  For general bathing Follow these steps when using CHG cloths for general bathing (unless your health care provider gives you different instructions). 1. Use a separate CHG cloth for each area of your body. Make sure you wash between any folds of skin and between your fingers and toes. Wash your body in the following order, switching to a new cloth after each step: ? The front of your neck, shoulders, and chest. ? Both of your arms, under your arms, and your hands. ?  Your stomach and groin area, avoiding the genitals. ? Your right leg and foot. ? Your left leg and foot. ? The back of your neck, your back, and your buttocks. 2. Do not rinse. Discard the cloth and let the area air-dry. Do not put any substances on your body afterward--such  as powder, lotion, or perfume--unless you are told to do so by your health care provider. Only use lotions that are recommended by the manufacturer. 3. Put on clean clothes or pajamas. Contact a health care provider if:  Your skin gets irritated after scrubbing.  You have questions about using your solution or cloth. Get help right away if:  Your eyes become very red or swollen.  Your eyes itch badly.  Your skin itches badly and is red or swollen.  Your hearing changes.  You have trouble seeing.  You have swelling or tingling in your mouth or throat.  You have trouble breathing.  You swallow any chlorhexidine. Summary  Chlorhexidine gluconate (CHG) is a germ-killing (antiseptic) solution that is used to clean the skin. Cleaning your skin with CHG may help to lower your risk for infection.  You may be given CHG to use for bathing. It may be in a bottle or in a prepackaged cloth to use on your skin. Carefully follow your health care provider's instructions and the instructions on the product label.  Do not use CHG if you have a chlorhexidine allergy.  Contact your health care provider if your skin gets irritated after scrubbing. This information is not intended to replace advice given to you by your health care provider. Make sure you discuss any questions you have with your health care provider. Document Revised: 02/07/2019 Document Reviewed: 10/19/2017 Elsevier Patient Education  [place].

## 2020-10-20 ENCOUNTER — Encounter (HOSPITAL_COMMUNITY)
Admission: RE | Admit: 2020-10-20 | Discharge: 2020-10-20 | Disposition: A | Discharge status: Home / Self Care | Payer: Medicaid Other | Source: Ambulatory Visit | Attending: General Surgery | Admitting: General Surgery

## 2020-10-20 ENCOUNTER — Other Ambulatory Visit: Payer: Self-pay

## 2020-10-21 ENCOUNTER — Ambulatory Visit (HOSPITAL_COMMUNITY): Payer: Medicaid Other | Admitting: Anesthesiology

## 2020-10-21 ENCOUNTER — Encounter (HOSPITAL_COMMUNITY)
Admission: RE | Disposition: A | Discharge status: Home / Self Care | Payer: Self-pay | Source: Home / Self Care | Attending: General Surgery

## 2020-10-21 ENCOUNTER — Ambulatory Visit (HOSPITAL_COMMUNITY)
Admission: RE | Admit: 2020-10-21 | Discharge: 2020-10-21 | Disposition: A | Discharge status: Home / Self Care | Payer: Medicaid Other | Attending: General Surgery | Admitting: General Surgery

## 2020-10-21 ENCOUNTER — Encounter (HOSPITAL_COMMUNITY): Payer: Self-pay | Admitting: General Surgery

## 2020-10-21 DIAGNOSIS — L821 Other seborrheic keratosis: Secondary | ICD-10-CM

## 2020-10-21 DIAGNOSIS — D235 Other benign neoplasm of skin of trunk: Secondary | ICD-10-CM | POA: Diagnosis not present

## 2020-10-21 DIAGNOSIS — Z79899 Other long term (current) drug therapy: Secondary | ICD-10-CM | POA: Diagnosis not present

## 2020-10-21 DIAGNOSIS — L723 Sebaceous cyst: Secondary | ICD-10-CM | POA: Diagnosis not present

## 2020-10-21 DIAGNOSIS — L988 Other specified disorders of the skin and subcutaneous tissue: Secondary | ICD-10-CM | POA: Diagnosis present

## 2020-10-21 HISTORY — PX: MASS EXCISION: SHX2000

## 2020-10-21 SURGERY — EXCISION MASS
Anesthesia: General | Site: Axilla | Laterality: Right

## 2020-10-21 MED ORDER — CEFAZOLIN SODIUM-DEXTROSE 2-4 GM/100ML-% IV SOLN
2.0000 g | INTRAVENOUS | Status: AC
  Administered 2020-10-21: 2 g via INTRAVENOUS

## 2020-10-21 MED ORDER — LACTATED RINGERS IV SOLN: INTRAVENOUS | Status: DC | PRN

## 2020-10-21 MED ORDER — EPHEDRINE 5 MG/ML INJ
INTRAVENOUS | Status: AC
  Filled 2020-10-21: qty 10

## 2020-10-21 MED ORDER — PROMETHAZINE HCL 25 MG/ML IJ SOLN: 6.2500 mg | INTRAMUSCULAR | Status: DC | PRN

## 2020-10-21 MED ORDER — CHLORHEXIDINE GLUCONATE CLOTH 2 % EX PADS: 6.0000 | MEDICATED_PAD | Freq: Once | CUTANEOUS | Status: DC

## 2020-10-21 MED ORDER — PHENYLEPHRINE 40 MCG/ML (10ML) SYRINGE FOR IV PUSH (FOR BLOOD PRESSURE SUPPORT)
PREFILLED_SYRINGE | INTRAVENOUS | Status: AC
  Filled 2020-10-21: qty 10

## 2020-10-21 MED ORDER — DEXAMETHASONE SODIUM PHOSPHATE 10 MG/ML IJ SOLN
INTRAMUSCULAR | Status: AC
  Filled 2020-10-21: qty 1

## 2020-10-21 MED ORDER — ONDANSETRON HCL 4 MG/2ML IJ SOLN
INTRAMUSCULAR | Status: AC
  Filled 2020-10-21: qty 2

## 2020-10-21 MED ORDER — FENTANYL CITRATE (PF) 100 MCG/2ML IJ SOLN
25.0000 ug | INTRAMUSCULAR | Status: DC | PRN
  Administered 2020-10-21 (×2): 50 ug via INTRAVENOUS
  Filled 2020-10-21: qty 2

## 2020-10-21 MED ORDER — FENTANYL CITRATE (PF) 100 MCG/2ML IJ SOLN
INTRAMUSCULAR | Status: AC
  Filled 2020-10-21: qty 2

## 2020-10-21 MED ORDER — SODIUM CHLORIDE 0.9 % IV SOLN
INTRAVENOUS | Status: DC | PRN
  Administered 2020-10-21 (×2): 80 ug via INTRAVENOUS

## 2020-10-21 MED ORDER — PROPOFOL 10 MG/ML IV BOLUS
INTRAVENOUS | Status: DC | PRN
  Administered 2020-10-21: 200 mg via INTRAVENOUS

## 2020-10-21 MED ORDER — CEFAZOLIN SODIUM-DEXTROSE 2-4 GM/100ML-% IV SOLN
INTRAVENOUS | Status: AC
  Filled 2020-10-21: qty 100

## 2020-10-21 MED ORDER — ONDANSETRON HCL 4 MG/2ML IJ SOLN
INTRAMUSCULAR | Status: DC | PRN
  Administered 2020-10-21: 4 mg via INTRAVENOUS

## 2020-10-21 MED ORDER — BUPIVACAINE HCL (PF) 0.5 % IJ SOLN
INTRAMUSCULAR | Status: DC | PRN
  Administered 2020-10-21: 10 mL

## 2020-10-21 MED ORDER — PROPOFOL 10 MG/ML IV BOLUS
INTRAVENOUS | Status: AC
  Filled 2020-10-21: qty 20

## 2020-10-21 MED ORDER — FENTANYL CITRATE (PF) 100 MCG/2ML IJ SOLN
INTRAMUSCULAR | Status: DC | PRN
  Administered 2020-10-21: 100 ug via INTRAVENOUS

## 2020-10-21 MED ORDER — CHLORHEXIDINE GLUCONATE 0.12 % MT SOLN
15.0000 mL | Freq: Once | OROMUCOSAL | Status: AC
  Administered 2020-10-21: 15 mL via OROMUCOSAL

## 2020-10-21 MED ORDER — DEXAMETHASONE SODIUM PHOSPHATE 4 MG/ML IJ SOLN
INTRAMUSCULAR | Status: DC | PRN
  Administered 2020-10-21: 4 mg via INTRAVENOUS

## 2020-10-21 MED ORDER — BUPIVACAINE HCL (PF) 0.5 % IJ SOLN
INTRAMUSCULAR | Status: AC
  Filled 2020-10-21: qty 30

## 2020-10-21 MED ORDER — MIDAZOLAM HCL 5 MG/5ML IJ SOLN
INTRAMUSCULAR | Status: DC | PRN
  Administered 2020-10-21: 2 mg via INTRAVENOUS

## 2020-10-21 MED ORDER — EPHEDRINE SULFATE 50 MG/ML IJ SOLN
INTRAMUSCULAR | Status: DC | PRN
  Administered 2020-10-21 (×2): 10 mg via INTRAVENOUS

## 2020-10-21 MED ORDER — MEPERIDINE HCL 50 MG/ML IJ SOLN: 6.2500 mg | INTRAMUSCULAR | Status: DC | PRN

## 2020-10-21 MED ORDER — ORAL CARE MOUTH RINSE: 15.0000 mL | Freq: Once | OROMUCOSAL | Status: AC

## 2020-10-21 MED ORDER — MIDAZOLAM HCL 2 MG/2ML IJ SOLN
INTRAMUSCULAR | Status: AC
  Filled 2020-10-21: qty 2

## 2020-10-21 MED ORDER — LACTATED RINGERS IV SOLN
Freq: Once | INTRAVENOUS | Status: AC
  Administered 2020-10-21: 1000 mL via INTRAVENOUS

## 2020-10-21 MED ORDER — 0.9 % SODIUM CHLORIDE (POUR BTL) OPTIME
TOPICAL | Status: DC | PRN
  Administered 2020-10-21: 1000 mL

## 2020-10-21 SURGICAL SUPPLY — 34 items
ADH SKN CLS APL DERMABOND .7 (GAUZE/BANDAGES/DRESSINGS) ×1
APL PRP STRL LF ISPRP CHG 10.5 (MISCELLANEOUS) ×1
APPLICATOR CHLORAPREP 10.5 ORG (MISCELLANEOUS) ×3 IMPLANT
CLOTH BEACON ORANGE TIMEOUT ST (SAFETY) ×3 IMPLANT
COVER LIGHT HANDLE STERIS (MISCELLANEOUS) ×6 IMPLANT
COVER WAND RF STERILE (DRAPES) ×3 IMPLANT
DECANTER SPIKE VIAL GLASS SM (MISCELLANEOUS) ×3 IMPLANT
DERMABOND ADVANCED (GAUZE/BANDAGES/DRESSINGS) ×2
DERMABOND ADVANCED .7 DNX12 (GAUZE/BANDAGES/DRESSINGS) ×1 IMPLANT
DRAPE EENT ADH APERT 31X51 STR (DRAPES) IMPLANT
ELECT NEEDLE TIP 2.8 STRL (NEEDLE) IMPLANT
ELECT REM PT RETURN 9FT ADLT (ELECTROSURGICAL) ×3
ELECTRODE REM PT RTRN 9FT ADLT (ELECTROSURGICAL) ×1 IMPLANT
GLOVE BIO SURGEON STRL SZ 6.5 (GLOVE) ×2 IMPLANT
GLOVE BIO SURGEONS STRL SZ 6.5 (GLOVE) ×1
GLOVE BIOGEL PI IND STRL 6.5 (GLOVE) ×1 IMPLANT
GLOVE BIOGEL PI IND STRL 7.0 (GLOVE) ×1 IMPLANT
GLOVE BIOGEL PI INDICATOR 6.5 (GLOVE) ×2
GLOVE BIOGEL PI INDICATOR 7.0 (GLOVE) ×2
GOWN STRL REUS W/TWL LRG LVL3 (GOWN DISPOSABLE) ×6 IMPLANT
KIT TURNOVER KIT A (KITS) ×3 IMPLANT
MANIFOLD NEPTUNE II (INSTRUMENTS) ×3 IMPLANT
NEEDLE HYPO 25X1 1.5 SAFETY (NEEDLE) ×3 IMPLANT
NS IRRIG 1000ML POUR BTL (IV SOLUTION) ×3 IMPLANT
PACK MINOR (CUSTOM PROCEDURE TRAY) ×3 IMPLANT
PAD ARMBOARD 7.5X6 YLW CONV (MISCELLANEOUS) ×3 IMPLANT
SET BASIN LINEN APH (SET/KITS/TRAYS/PACK) ×3 IMPLANT
SUT ETHILON 3 0 FSL (SUTURE) IMPLANT
SUT MNCRL AB 4-0 PS2 18 (SUTURE) ×6 IMPLANT
SUT PROLENE 4 0 PS 2 18 (SUTURE) ×6 IMPLANT
SUT VIC AB 3-0 SH 27 (SUTURE) ×6
SUT VIC AB 3-0 SH 27X BRD (SUTURE) ×2 IMPLANT
SYR BULB IRRIG 60ML STRL (SYRINGE) ×3 IMPLANT
SYR CONTROL 10ML LL (SYRINGE) ×3 IMPLANT

## 2020-10-21 NOTE — Anesthesia Postprocedure Evaluation (Signed)
Anesthesia Post Note  Patient: Julie [name]  Procedure(s) Performed: EXCISION CYST 2 CM, RIGHT AXILLA  AND EXCISION OF RIGHT FLANK SKIN LESION (Right Axilla)  Patient location during evaluation: PACU Anesthesia Type: General Level of consciousness: awake, oriented, awake and alert and patient cooperative Pain management: pain level controlled Vital Signs Assessment: post-procedure vital signs reviewed and stable Respiratory status: spontaneous breathing, respiratory function stable and nonlabored ventilation Cardiovascular status: blood pressure returned to baseline and stable Postop Assessment: no headache and no backache Anesthetic complications: no   No complications documented.   Last Vitals:  Vitals:   10/21/20 1100 10/21/20 1215  BP: 98/76 103/79  Pulse: 80 88  Resp: 10 12  Temp:    SpO2: 98%     Last Pain:  Vitals:   10/21/20 1021  TempSrc: Oral  PainSc: 0-No pain                 Tacy Learn

## 2020-10-21 NOTE — Interval H&P Note (Signed)
History and Physical Interval Note:  10/21/2020 10:43 AM  Julie [name]  has presented today for surgery, with the diagnosis of sebaceous cyst right axilla, cyst right flank.  The various methods of treatment have been discussed with the patient and family. After consideration of risks, benefits and other options for treatment, the patient has consented to  Procedure(s): EXCISION SKIN LESION 2 CM, RIGHT AXILLA (Right) as a surgical intervention.  The patient's history has been reviewed, patient examined, no change in status, stable for surgery.  I have reviewed the patient's chart and labs.  Questions were answered to the patient's satisfaction.    No changes. Feels like she is getting more moles on her body overall. Recommended dermatology evaluation. Will excision the right flank skin lesion and axilla cyst.   Virl Cagey

## 2020-10-21 NOTE — Op Note (Addendum)
[facility] Operative Note  10/21/20  Preoperative Diagnosis: Right axillary cyst and right flank skin lesions    Postoperative Diagnosis: Same   Procedure(s) Performed: Excision of right axillary cyst 2cm and right flank skin lesion 1.5cm (total 3.5 cm)    Surgeon: Lanell Matar. Constance Haw, MD   Assistants: No qualified resident was available    Anesthesia: General anesthesia with LMA    Anesthesiologist: Denese Killings, MD    Specimens: Right flank skin lesion (short superior, long lateral); right axillary cyst    Estimated Blood Loss: Minimal   Blood Replacement: None    Complications: None   Wound Class: Clean    Operative Indications:  Ms. Mcginn is a 49 yo with a right skin lesions on her flank most consistent with seborrheic keratosis but has fallen off and grown back and she wants it excised due to its location and abnormal appearance. In addition to this she has a right axillary cyst confirmed on [place] that she also wants excised. We discussed the risk of bleeding, infection, finding something like cancer or needing additional surgeries.   Findings: Skin lesion and enlarged axillary cyst with some yellow creamy fluid expressed    Procedure: The patient was taken to the operating room and placed supine. General anesthesia was induced. Intravenous antibiotics were administered per protocol.  The right axilla and right flank was prepared and draped in the usual sterile fashion.   Local was injected and an elliptical incision was made around the skin lesion with at least 2 mm of normal skin edge and carried down through to the subcutaneous adipose tissue. The lesion was excised and marked short superior and long lateral. The area was made hemostatic and closed with interrupted 3-0 Vicryl and a running 4-0 Monocryl subcuticular.   Attention was then turned to the right axillary lesion and local was injected. The cyst felt more prominent that previous exams. An  elliptical incision was made in the most prominent portion and the cyst was excised with electrocautery. There was some spillage of a yellow cream fluid.  This was excised in its entirety. Hemostasis was confirmed. The cavity was closed with 3-0 Vicryl interrupted and 4-0 Monocryl subcuticular.  Both incisions were covered with dermabond.   Final inspection revealed acceptable hemostasis. All counts were correct at the end of the case. The patient was awakened from anesthesia and extubated without complication.  The patient went to the PACU in stable condition.   Curlene Labrum, MD [facility] [address] [place], [place] [address] [phone] (office)

## 2020-10-21 NOTE — Transfer of Care (Signed)
Immediate Anesthesia Transfer of Care Note  Patient: Julie [name]  Procedure(s) Performed: EXCISION CYST 2 CM, RIGHT AXILLA  AND EXCISION OF RIGHT FLANK SKIN LESION (Right Axilla)  Patient Location: PACU  Anesthesia Type:General  Level of Consciousness: awake, alert , oriented and patient cooperative  Airway & Oxygen Therapy: Patient Spontanous Breathing  Post-op Assessment: Report given to RN, Post -op Vital signs reviewed and stable and Patient moving all extremities  Post vital signs: Reviewed and stable  Last Vitals:  Vitals Value Taken Time  BP 103/79 10/21/20 1215  Temp    Pulse 88 10/21/20 1215  Resp 12 10/21/20 1215  SpO2      Last Pain:  Vitals:   10/21/20 1021  TempSrc: Oral  PainSc: 0-No pain      Patients Stated Pain Goal: 8 (42/10/31 2811)  Complications: No complications documented.

## 2020-10-21 NOTE — Anesthesia Preprocedure Evaluation (Signed)
Anesthesia Evaluation  Patient identified by MRN, date of birth, ID band Patient awake    Reviewed: Allergy & Precautions, NPO status , Patient's Chart, lab work & pertinent test results  History of Anesthesia Complications Negative for: history of anesthetic complications  Airway Mallampati: III  TM Distance: >3 FB Neck ROM: Full    Dental  (+) Partial Upper, Missing, Dental Advisory Given,    Pulmonary asthma , pneumonia, Current SmokerPatient did not abstain from smoking.,    Pulmonary exam normal breath sounds clear to auscultation       Cardiovascular Normal cardiovascular exam+ Valvular Problems/Murmurs AI  Rhythm:Regular Rate:Normal     Neuro/Psych  Headaches, Seizures -, Well Controlled,  PSYCHIATRIC DISORDERS Anxiety Depression    GI/Hepatic Neg liver ROS, GERD  Medicated and Controlled,  Endo/Other  negative endocrine ROS  Renal/GU negative Renal ROS     Musculoskeletal  (+) Arthritis  (Back pain), Osteoarthritis,    Abdominal   Peds  Hematology   Anesthesia Other Findings   Reproductive/Obstetrics negative OB ROS                             Anesthesia Physical  Anesthesia Plan  ASA: III  Anesthesia Plan: General   Post-op Pain Management:    Induction:   PONV Risk Score and Plan: Ondansetron, Dexamethasone and Midazolam  Airway Management Planned: LMA  Additional Equipment:   Intra-op Plan:   Post-operative Plan:   Informed Consent: I have reviewed the patients History and Physical, chart, labs and discussed the procedure including the risks, benefits and alternatives for the proposed anesthesia with the patient or authorized representative who has indicated his/her understanding and acceptance.     Dental advisory given  Plan Discussed with: CRNA  Anesthesia Plan Comments:         Anesthesia Quick Evaluation

## 2020-10-21 NOTE — Anesthesia Procedure Notes (Signed)
Procedure Name: LMA Insertion Date/Time: 10/21/2020 11:19 AM Performed by: Jonna Munro, CRNA Pre-anesthesia Checklist: Patient identified, Emergency Drugs available, Suction available, Patient being monitored and Timeout performed Patient Re-evaluated:Patient Re-evaluated prior to induction Oxygen Delivery Method: Circle system utilized Preoxygenation: Pre-oxygenation with 100% oxygen Induction Type: IV induction LMA: LMA inserted LMA Size: 3.0 Number of attempts: 1 Placement Confirmation: positive ETCO2 and breath sounds checked- equal and bilateral Tube secured with: Tape Dental Injury: Teeth and Oropharynx as per pre-operative assessment

## 2020-10-21 NOTE — Discharge Instructions (Signed)
Discharge Instructions:  Common Complaints: Pain at the incision site is common.  Some nausea is common and poor appetite after anesthesia is common. The main goal is to stay hydrated the first few days after surgery.   Diet/ Activity: Diet as tolerated. You may not have an appetite, but it is important to stay hydrated. Drink 64 ounces of water a day. Your appetite will return with time.  Shower per your regular routine daily.  Do not take hot showers. Take warm showers that are less than 10 minutes to prevent the glue from peeling.  Walk everyday for at least 15-20 minutes. Deep cough and move around every 1-2 hours in the first few days after surgery.  Limit lifting > 10 lbs, stretching with the limb if there is an incision on your arm/armpit or leg.   Do move your arm and armpit to keep it from getting stiff.  Walk your hand up and down the wall to move this area.  Do not pick at the dermabond glue on your incision sites. This glue film will remain in place for 1-2 weeks and will start to peel off.  Do not place lotions or balms on your incision unless instructed to specifically by Dr. Constance Haw.   Pain Expectations and Narcotics: -After surgery you will have pain associated with your incisions and this is normal. The pain is muscular and nerve pain, and will get better with time. -You are encouraged and expected to take non narcotic medications like tylenol and ibuprofen (when able) to treat pain as multiple modalities can aid with pain treatment. -Narcotics are only used when pain is severe or there is breakthrough pain. -You are not expected to have a pain score of 0 after surgery, as we cannot prevent pain. A pain score of 3-4 that allows you to be functional, move, walk, and tolerate some activity is the goal. The pain will continue to improve over the days after surgery and is dependent on your surgery. -Due to [place] law, we are only able to give a certain amount of pain medication to treat  post operative pain, and we only give additional narcotics on a patient by patient basis.  -For most laparoscopic surgery, studies have shown that the majority of patients only need 10-15 narcotic pills, and for open surgeries most patients only need 15-20.   -Having appropriate expectations of pain and knowledge of pain management with non narcotics is important as we do not want anyone to become addicted to narcotic pain medication.  -Using ice packs in the first 48 hours and heating pads after 48 hours, wearing an abdominal binder (when recommended), and using over the counter medications are all ways to help with pain management.   -Simple acts like meditation and mindfulness practices after surgery can also help with pain control and research has proven the benefit of these practices.  Medication: Take tylenol and ibuprofen as needed for pain control, alternating every 4-6 hours.  Example:  Tylenol 1000mg  @ 6am, 12noon, 6pm, 75midnight (Do not exceed 4000mg  of tylenol a day). Ibuprofen 800mg  @ 9am, 3pm, 9pm, 3am (Do not exceed 3600mg  of ibuprofen a day).  Take Your Prescribed Percocet for Break Through Pain.  Due to [place] law I am unable to prescribe more pain medication.   Take Colace for constipation related to narcotic pain medication. If you do not have a bowel movement in 2 days, take Miralax over the counter.  Drink plenty of water to also prevent constipation.  Contact Information: If you have questions or concerns, please call our office, [phone], Monday- Thursday 8AM-5PM and Friday 8AM-12Noon.  If it is after hours or on the weekend, please call [facility] Main Number, [phone], and ask to speak to the surgeon on call for Dr. Constance Haw at [facility].        General Anesthesia, Adult, Care After This sheet gives you information about how to care for yourself after your procedure. Your health care provider may also give you more specific instructions. If you have problems or  questions, contact your health care provider. What can I expect after the procedure? After the procedure, the following side effects are common:  Pain or discomfort at the IV site.  Nausea.  Vomiting.  Sore throat.  Trouble concentrating.  Feeling cold or chills.  Weak or tired.  Sleepiness and fatigue.  Soreness and body aches. These side effects can affect parts of the body that were not involved in surgery. Follow these instructions at home:  For at least 24 hours after the procedure:  Have a responsible adult stay with you. It is important to have someone help care for you until you are awake and alert.  Rest as needed.  Do not: ? Participate in activities in which you could fall or become injured. ? Drive. ? Use heavy machinery. ? Drink alcohol. ? Take sleeping pills or medicines that cause drowsiness. ? Make important decisions or sign legal documents. ? Take care of children on your own. Eating and drinking  Follow any instructions from your health care provider about eating or drinking restrictions.  When you feel hungry, start by eating small amounts of foods that are soft and easy to digest (bland), such as toast. Gradually return to your regular diet.  Drink enough fluid to keep your urine pale yellow.  If you vomit, rehydrate by drinking water, juice, or clear broth. General instructions  If you have sleep apnea, surgery and certain medicines can increase your risk for breathing problems. Follow instructions from your health care provider about wearing your sleep device: ? Anytime you are sleeping, including during daytime naps. ? While taking prescription pain medicines, sleeping medicines, or medicines that make you drowsy.  Return to your normal activities as told by your health care provider. Ask your health care provider what activities are safe for you.  Take over-the-counter and prescription medicines only as told by your health care  provider.  If you smoke, do not smoke without supervision.  Keep all follow-up visits as told by your health care provider. This is important. Contact a health care provider if:  You have nausea or vomiting that does not get better with medicine.  You cannot eat or drink without vomiting.  You have pain that does not get better with medicine.  You are unable to pass urine.  You develop a skin rash.  You have a fever.  You have redness around your IV site that gets worse. Get help right away if:  You have difficulty breathing.  You have chest pain.  You have blood in your urine or stool, or you vomit blood. Summary  After the procedure, it is common to have a sore throat or nausea. It is also common to feel tired.  Have a responsible adult stay with you for the first 24 hours after general anesthesia. It is important to have someone help care for you until you are awake and alert.  When you feel hungry, start by eating small  amounts of foods that are soft and easy to digest (bland), such as toast. Gradually return to your regular diet.  Drink enough fluid to keep your urine pale yellow.  Return to your normal activities as told by your health care provider. Ask your health care provider what activities are safe for you. This information is not intended to replace advice given to you by your health care provider. Make sure you discuss any questions you have with your health care provider. Document Revised: 11/24/2017 Document Reviewed: 07/07/2017 Elsevier Patient Education  [place].

## 2020-10-22 ENCOUNTER — Encounter (HOSPITAL_COMMUNITY): Payer: Self-pay | Admitting: General Surgery

## 2020-10-22 LAB — SURGICAL PATHOLOGY
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
## 2020-11-05 ENCOUNTER — Encounter: Payer: Self-pay | Admitting: General Surgery

## 2020-11-05 ENCOUNTER — Other Ambulatory Visit: Payer: Self-pay

## 2020-11-05 ENCOUNTER — Ambulatory Visit (INDEPENDENT_AMBULATORY_CARE_PROVIDER_SITE_OTHER): Payer: Medicaid Other | Admitting: General Surgery

## 2020-11-05 VITALS — BP 120/80 | HR 115 | Temp 98.1°F | Resp 12 | Ht 60.0 in | Wt 135.0 lb

## 2020-11-05 DIAGNOSIS — L821 Other seborrheic keratosis: Secondary | ICD-10-CM

## 2020-11-05 DIAGNOSIS — D239 Other benign neoplasm of skin, unspecified: Secondary | ICD-10-CM

## 2020-11-05 NOTE — Patient Instructions (Signed)
Keep neosporin on area and otherwise keep clean and dry.  Call if concerns or questions. Hold on [place] for now.

## 2020-11-05 NOTE — Progress Notes (Signed)
[facility] Note   HPI:  49 y.o. Female presents to clinic for post-op follow-up evaluation of her right axilla excision and right flank excision. She is doing well and has no major complaints. she is not sure if the axilla healed completely.   Review of Systems:  No redness or drainage Open area axilla  All other review of systems: otherwise negative   Vital Signs:  BP 120/80   Pulse (!) 115   Temp 98.1 F (36.7 C) (Other (Comment))   Resp 12   Ht 5' (1.524 m)   Wt 135 lb (61.2 kg)   LMP  (LMP Unknown)   SpO2 93%   BMI 26.37 kg/m    Physical Exam:  Physical Exam Vitals reviewed.  Cardiovascular:     Rate and Rhythm: Normal rate.  Pulmonary:     Effort: Pulmonary effort is normal.  Skin:    Comments: Right axilla incision without erythema or drainage, but indurated and open about 1cm long, 0.5cm wide centrally with some fibrinous tissue, right flank incision healed without erythema or drainage  Neurological:     Mental Status: She is alert.    Pathology: FINAL MICROSCOPIC DIAGNOSIS:   A. SKIN, RIGHT FLANK, EXCISION:  - Seborrheic keratosis, margin free.   B. CYST, RIGHT AXILLA, CYSTECTOMY:  - Eccrine hidrocystoma, margin free.   Assessment:  49 y.o. yo Female with excision of a seborrheic keratosis and eccrine hidrocystoma. The axilla is partially dehisced. Will need to continue wound care until healed up. No signs of infection. Induration should continue to improve.  Plan:  Keep neosporin on area and otherwise keep clean and dry.  Call if concerns or questions. Hold on [place] for now.   Future Appointments  Date Time Provider [place]  11/10/2020  1:20 PM Norman Clay, MD [facility] None  11/19/2020 11:45 AM Virl Cagey, MD [facility] None     Curlene Labrum, MD [facility] [address] [place], [place] [address] [phone] (office)

## 2020-11-10 ENCOUNTER — Other Ambulatory Visit: Payer: Self-pay

## 2020-11-10 ENCOUNTER — Telehealth: Payer: Self-pay | Admitting: Psychiatry

## 2020-11-10 ENCOUNTER — Telehealth: Payer: Medicaid Other | Admitting: Psychiatry

## 2020-11-10 NOTE — Telephone Encounter (Signed)
Sent link for video visit through Epic. Patient did not sign in. Called the patient  for appointment scheduled today. The patient did not answer the phone. Left voice message to contact the office.  

## 2020-11-19 ENCOUNTER — Ambulatory Visit: Payer: Medicaid Other | Admitting: General Surgery

## 2020-12-15 ENCOUNTER — Telehealth: Payer: Self-pay | Admitting: *Deleted

## 2020-12-15 ENCOUNTER — Other Ambulatory Visit: Payer: Self-pay | Admitting: Psychiatry

## 2020-12-15 MED ORDER — LORAZEPAM 1 MG PO TABS: 1.0000 mg | ORAL_TABLET | Freq: Two times a day (BID) | ORAL | 0 refills | Status: DC | PRN

## 2020-12-15 NOTE — Telephone Encounter (Signed)
Pharmacy sent fax requesting refill of Ativan for patient.  Please review.

## 2020-12-15 NOTE — Telephone Encounter (Signed)
Ordered. Please contact the patient to have follow up appointment (for 30 mins). I will not be able to do any more refill without evaluation

## 2020-12-16 NOTE — Telephone Encounter (Signed)
Lea is contacting patient

## 2020-12-30 ENCOUNTER — Other Ambulatory Visit: Payer: Self-pay

## 2020-12-30 ENCOUNTER — Telehealth (INDEPENDENT_AMBULATORY_CARE_PROVIDER_SITE_OTHER): Payer: Medicaid Other | Admitting: Psychiatry

## 2020-12-30 ENCOUNTER — Encounter: Payer: Self-pay | Admitting: Psychiatry

## 2020-12-30 DIAGNOSIS — F330 Major depressive disorder, recurrent, mild: Secondary | ICD-10-CM | POA: Diagnosis not present

## 2020-12-30 DIAGNOSIS — F4310 Post-traumatic stress disorder, unspecified: Secondary | ICD-10-CM

## 2020-12-30 DIAGNOSIS — G4700 Insomnia, unspecified: Secondary | ICD-10-CM | POA: Diagnosis not present

## 2020-12-30 MED ORDER — ARIPIPRAZOLE 10 MG PO TABS: 10.0000 mg | ORAL_TABLET | Freq: Every day | ORAL | 0 refills | Status: DC

## 2020-12-30 MED ORDER — TRAZODONE HCL 150 MG PO TABS: 150.0000 mg | ORAL_TABLET | Freq: Every day | ORAL | 0 refills | Status: DC

## 2020-12-30 MED ORDER — LORAZEPAM 1 MG PO TABS: 1.0000 mg | ORAL_TABLET | Freq: Two times a day (BID) | ORAL | 1 refills | Status: DC | PRN

## 2020-12-30 MED ORDER — DULOXETINE HCL 60 MG PO CPEP: 60.0000 mg | ORAL_CAPSULE | Freq: Every day | ORAL | 0 refills | Status: DC

## 2020-12-30 MED ORDER — DULOXETINE HCL 30 MG PO CPEP
ORAL_CAPSULE | ORAL | 0 refills | Status: DC
Start: 2021-01-11 — End: 2021-04-07
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
## 2021-02-16 ENCOUNTER — Other Ambulatory Visit: Payer: Medicaid Other | Admitting: Adult Health
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
## 2021-03-02 ENCOUNTER — Telehealth (INDEPENDENT_AMBULATORY_CARE_PROVIDER_SITE_OTHER): Payer: Medicaid Other | Admitting: Psychiatry

## 2021-03-02 ENCOUNTER — Encounter: Payer: Self-pay | Admitting: Psychiatry

## 2021-03-02 ENCOUNTER — Other Ambulatory Visit: Payer: Self-pay

## 2021-03-02 ENCOUNTER — Telehealth: Payer: Self-pay | Admitting: Psychiatry

## 2021-03-02 DIAGNOSIS — F330 Major depressive disorder, recurrent, mild: Secondary | ICD-10-CM | POA: Diagnosis not present

## 2021-03-02 DIAGNOSIS — G4700 Insomnia, unspecified: Secondary | ICD-10-CM

## 2021-03-02 DIAGNOSIS — F4310 Post-traumatic stress disorder, unspecified: Secondary | ICD-10-CM

## 2021-03-02 NOTE — Patient Instructions (Signed)
1.Continueduloxetine90 mg daily  2.ContinueAbilify10mg  daily- monitor weight gain 3.Continuelorazepam 1 mg twice a day as needed for anxiety 4.ContinueTrazodone100mg  at night as needed for sleep 5. Next appointment:5/4 at 2 PM
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
## 2021-04-06 ENCOUNTER — Other Ambulatory Visit: Payer: Self-pay

## 2021-04-06 ENCOUNTER — Encounter: Payer: Self-pay | Admitting: Adult Health

## 2021-04-06 ENCOUNTER — Other Ambulatory Visit (INDEPENDENT_AMBULATORY_CARE_PROVIDER_SITE_OTHER): Payer: Self-pay

## 2021-04-06 ENCOUNTER — Ambulatory Visit (INDEPENDENT_AMBULATORY_CARE_PROVIDER_SITE_OTHER): Payer: Medicaid Other | Admitting: Adult Health

## 2021-04-06 VITALS — BP 107/73 | HR 109 | Ht 60.0 in | Wt 139.0 lb

## 2021-04-06 DIAGNOSIS — K219 Gastro-esophageal reflux disease without esophagitis: Secondary | ICD-10-CM

## 2021-04-06 DIAGNOSIS — IMO0002 Reserved for concepts with insufficient information to code with codable children: Secondary | ICD-10-CM

## 2021-04-06 DIAGNOSIS — Z1231 Encounter for screening mammogram for malignant neoplasm of breast: Secondary | ICD-10-CM | POA: Insufficient documentation

## 2021-04-06 DIAGNOSIS — N3941 Urge incontinence: Secondary | ICD-10-CM | POA: Insufficient documentation

## 2021-04-06 DIAGNOSIS — Z1211 Encounter for screening for malignant neoplasm of colon: Secondary | ICD-10-CM | POA: Diagnosis not present

## 2021-04-06 DIAGNOSIS — Z01419 Encounter for gynecological examination (general) (routine) without abnormal findings: Secondary | ICD-10-CM | POA: Insufficient documentation

## 2021-04-06 DIAGNOSIS — F5231 Female orgasmic disorder: Secondary | ICD-10-CM

## 2021-04-06 LAB — HEMOCCULT GUIAC POC 1CARD (OFFICE): Fecal Occult Blood, POC: NEGATIVE

## 2021-04-06 MED ORDER — PANTOPRAZOLE SODIUM 40 MG PO TBEC: 40.0000 mg | DELAYED_RELEASE_TABLET | Freq: Two times a day (BID) | ORAL | 0 refills | Status: AC

## 2021-04-06 NOTE — Patient Instructions (Signed)
Calorie Counting for Weight Loss Calories are units of energy. Your body needs a certain number of calories from food to keep going throughout the day. When you eat or drink more calories than your body needs, your body stores the extra calories mostly as fat. When you eat or drink fewer calories than your body needs, your body burns fat to get the energy it needs. Calorie counting means keeping track of how many calories you eat and drink each day. Calorie counting can be helpful if you need to lose weight. If you eat fewer calories than your body needs, you should lose weight. Ask your health care provider what a healthy weight is for you. For calorie counting to work, you will need to eat the right number of calories each day to lose a healthy amount of weight per week. A dietitian can help you figure out how many calories you need in a day and will suggest ways to reach your calorie goal.  A healthy amount of weight to lose each week is usually 1-2 lb (0.5-0.9 kg). This usually means that your daily calorie intake should be reduced by 500-750 calories.  Eating 1,200-1,500 calories a day can help most women lose weight.  Eating 1,500-1,800 calories a day can help most men lose weight. What do I need to know about calorie counting? Work with your health care provider or dietitian to determine how many calories you should get each day. To meet your daily calorie goal, you will need to:  Find out how many calories are in each food that you would like to eat. Try to do this before you eat.  Decide how much of the food you plan to eat.  Keep a food log. Do this by writing down what you ate and how many calories it had. To successfully lose weight, it is important to balance calorie counting with a healthy lifestyle that includes regular activity. Where do I find calorie information? The number of calories in a food can be found on a Nutrition Facts label. If a food does not have a Nutrition Facts  label, try to look up the calories online or ask your dietitian for help. Remember that calories are listed per serving. If you choose to have more than one serving of a food, you will have to multiply the calories per serving by the number of servings you plan to eat. For example, the label on a package of bread might say that a serving size is 1 slice and that there are 90 calories in a serving. If you eat 1 slice, you will have eaten 90 calories. If you eat 2 slices, you will have eaten 180 calories.   How do I keep a food log? After each time that you eat, record the following in your food log as soon as possible:  What you ate. Be sure to include toppings, sauces, and other extras on the food.  How much you ate. This can be measured in cups, ounces, or number of items.  How many calories were in each food and drink.  The total number of calories in the food you ate. Keep your food log near you, such as in a pocket-sized notebook or on an app or website on your mobile phone. Some programs will calculate calories for you and show you how many calories you have left to meet your daily goal. What are some portion-control tips?  Know how many calories are in a serving. This will   help you know how many servings you can have of a certain food.  Use a measuring cup to measure serving sizes. You could also try weighing out portions on a kitchen scale. With time, you will be able to estimate serving sizes for some foods.  Take time to put servings of different foods on your favorite plates or in your favorite bowls and cups so you know what a serving looks like.  Try not to eat straight from a food's packaging, such as from a bag or box. Eating straight from the package makes it hard to see how much you are eating and can lead to overeating. Put the amount you would like to eat in a cup or on a plate to make sure you are eating the right portion.  Use smaller plates, glasses, and bowls for smaller  portions and to prevent overeating.  Try not to multitask. For example, avoid watching TV or using your computer while eating. If it is time to eat, sit down at a table and enjoy your food. This will help you recognize when you are full. It will also help you be more mindful of what and how much you are eating. What are tips for following this plan? Reading food labels  Check the calorie count compared with the serving size. The serving size may be smaller than what you are used to eating.  Check the source of the calories. Try to choose foods that are high in protein, fiber, and vitamins, and low in saturated fat, trans fat, and sodium. Shopping  Read nutrition labels while you shop. This will help you make healthy decisions about which foods to buy.  Pay attention to nutrition labels for low-fat or fat-free foods. These foods sometimes have the same number of calories or more calories than the full-fat versions. They also often have added sugar, starch, or salt to make up for flavor that was removed with the fat.  Make a grocery list of lower-calorie foods and stick to it. Cooking  Try to cook your favorite foods in a healthier way. For example, try baking instead of frying.  Use low-fat dairy products. Meal planning  Use more fruits and vegetables. One-half of your plate should be fruits and vegetables.  Include lean proteins, such as chicken, turkey, and fish. Lifestyle Each week, aim to do one of the following:  150 minutes of moderate exercise, such as walking.  75 minutes of vigorous exercise, such as running. General information  Know how many calories are in the foods you eat most often. This will help you calculate calorie counts faster.  Find a way of tracking calories that works for you. Get creative. Try different apps or programs if writing down calories does not work for you. What foods should I eat?  Eat nutritious foods. It is better to have a nutritious,  high-calorie food, such as an avocado, than a food with few nutrients, such as a bag of potato chips.  Use your calories on foods and drinks that will fill you up and will not leave you hungry soon after eating. ? Examples of foods that fill you up are nuts and nut butters, vegetables, lean proteins, and high-fiber foods such as whole grains. High-fiber foods are foods with more than 5 g of fiber per serving.  Pay attention to calories in drinks. Low-calorie drinks include water and unsweetened drinks. The items listed above may not be a complete list of foods and beverages you can eat.   Contact a dietitian for more information.   What foods should I limit? Limit foods or drinks that are not good sources of vitamins, minerals, or protein or that are high in unhealthy fats. These include:  Candy.  Other sweets.  Sodas, specialty coffee drinks, alcohol, and juice. The items listed above may not be a complete list of foods and beverages you should avoid. Contact a dietitian for more information. How do I count calories when eating out?  Pay attention to portions. Often, portions are much larger when eating out. Try these tips to keep portions smaller: ? Consider sharing a meal instead of getting your own. ? If you get your own meal, eat only half of it. Before you start eating, ask for a container and put half of your meal into it. ? When available, consider ordering smaller portions from the menu instead of full portions.  Pay attention to your food and drink choices. Knowing the way food is cooked and what is included with the meal can help you eat fewer calories. ? If calories are listed on the menu, choose the lower-calorie options. ? Choose dishes that include vegetables, fruits, whole grains, low-fat dairy products, and lean proteins. ? Choose items that are boiled, broiled, grilled, or steamed. Avoid items that are buttered, battered, fried, or served with cream sauce. Items labeled as  crispy are usually fried, unless stated otherwise. ? Choose water, low-fat milk, unsweetened iced tea, or other drinks without added sugar. If you want an alcoholic beverage, choose a lower-calorie option, such as a glass of wine or light beer. ? Ask for dressings, sauces, and syrups on the side. These are usually high in calories, so you should limit the amount you eat. ? If you want a salad, choose a garden salad and ask for grilled meats. Avoid extra toppings such as bacon, cheese, or fried items. Ask for the dressing on the side, or ask for olive oil and vinegar or lemon to use as dressing.  Estimate how many servings of a food you are given. Knowing serving sizes will help you be aware of how much food you are eating at restaurants. Where to find more information  Centers for Disease Control and Prevention: www.cdc.gov  [place] Department of Agriculture: myplate.gov Summary  Calorie counting means keeping track of how many calories you eat and drink each day. If you eat fewer calories than your body needs, you should lose weight.  A healthy amount of weight to lose per week is usually 1-2 lb (0.5-0.9 kg). This usually means reducing your daily calorie intake by 500-750 calories.  The number of calories in a food can be found on a Nutrition Facts label. If a food does not have a Nutrition Facts label, try to look up the calories online or ask your dietitian for help.  Use smaller plates, glasses, and bowls for smaller portions and to prevent overeating.  Use your calories on foods and drinks that will fill you up and not leave you hungry shortly after a meal. This information is not intended to replace advice given to you by your health care provider. Make sure you discuss any questions you have with your health care provider. Document Revised: 01/02/2020 Document Reviewed: 01/02/2020 Elsevier Patient Education  2021 Elsevier Inc.  

## 2021-04-06 NOTE — Progress Notes (Signed)
Patient ID: Julie [name], female   DOB: 07/26/1971, 50 y.o.   MRN: [id number] History of Present Illness: Julie [name] is a 50 year old white female, single, PM in for aw well woman gyn exam, she had a normal pap with negative HPV 01/06/20. PCP is Dr Woody Seller.  Current Medications, Allergies, Past Medical History, Past Surgical History, Family History and Social History were reviewed in [employer] record.     Review of Systems: Patient denies any headaches, hearing loss, fatigue, blurred vision, shortness of breath, chest pain, abdominal pain, problems with bowel movements,  or intercourse. No joint pain or mood swings. +weight gain she says  +urge incontinence  She says she can not have orgasm.    Physical Exam:BP 107/73 (BP Location: Left Arm, Patient Position: Sitting, Cuff Size: Normal)   Pulse (!) 109   Ht 5' (1.524 m)   Wt 139 lb (63 kg)   LMP  (LMP Unknown)   BMI 27.15 kg/m  General:  Well developed, well nourished, no acute distress Skin:  Warm and dry Neck:  Midline trachea, normal thyroid, good ROM, no lymphadenopathy Lungs; Clear to auscultation bilaterally Breast:  No dominant palpable mass, retraction, or nipple discharge,has bilateral implants, had comedome expressed left breast Cardiovascular: Regular rate and rhythm Abdomen:  Soft, non tender, no hepatosplenomegaly Pelvic:  External genitalia is normal in appearance,except has several epidermal and sebaceous cyst.  The vagina is normal in appearance. Urethra has no lesions or masses. The cervix is bulbous.  Uterus is felt to be normal size, shape, and contour.  No adnexal masses or tenderness noted.Bladder is non tender, no masses felt. Rectal: Good sphincter tone, no polyps, or hemorrhoids felt.  Hemoccult negative. Extremities/musculoskeletal:  No swelling or varicosities noted, no clubbing or cyanosis Psych:  No mood changes, alert and cooperative,seems happy AA is 0 Fall risk is low PHQ 9 score is  20, no SI, is on meds and sees Behavorial health GAD 7 score is 9  [facility] - 04/06/21 1346      Pregnancy Intention Screening   Does the patient want to become pregnant in the next year? No    Does the patient's partner want to become pregnant in the next year? No    Would the patient like to discuss contraceptive options today? No      Contraception Wrap Up   Current Method Female Sterilization    End Method Female Sterilization    Contraception Counseling Provided No         Examination chaperoned by Corporate investment banker.  Impression and Plan: 1. Encounter for well woman exam with routine gynecological exam Physical in 1 year Pap in 2024 Labs with PCP Colonoscopy 2027   2. Encounter for screening fecal occult blood testing   3. Screening mammogram for breast cancer Call for mammogram appt  4. Orgasm dysfunction Discussed may be related to medications  5. Urge incontinence of urine Referred to urology in [place]

## 2021-04-07 ENCOUNTER — Telehealth (INDEPENDENT_AMBULATORY_CARE_PROVIDER_SITE_OTHER): Payer: Medicaid Other | Admitting: Psychiatry

## 2021-04-07 ENCOUNTER — Encounter: Payer: Self-pay | Admitting: Psychiatry

## 2021-04-07 DIAGNOSIS — F331 Major depressive disorder, recurrent, moderate: Secondary | ICD-10-CM

## 2021-04-07 DIAGNOSIS — F4310 Post-traumatic stress disorder, unspecified: Secondary | ICD-10-CM

## 2021-04-07 DIAGNOSIS — G4700 Insomnia, unspecified: Secondary | ICD-10-CM | POA: Diagnosis not present

## 2021-04-07 MED ORDER — DULOXETINE HCL 60 MG PO CPEP: 60.0000 mg | ORAL_CAPSULE | Freq: Every day | ORAL | 0 refills | Status: DC

## 2021-04-07 MED ORDER — DULOXETINE HCL 30 MG PO CPEP: ORAL_CAPSULE | ORAL | 0 refills | Status: DC

## 2021-04-07 MED ORDER — ARIPIPRAZOLE 5 MG PO TABS: 5.0000 mg | ORAL_TABLET | Freq: Every day | ORAL | 0 refills | Status: DC

## 2021-04-07 MED ORDER — TRAZODONE HCL 150 MG PO TABS: 150.0000 mg | ORAL_TABLET | Freq: Every day | ORAL | 0 refills | Status: DC

## 2021-05-10 ENCOUNTER — Telehealth: Payer: Self-pay

## 2021-05-10 DIAGNOSIS — F331 Major depressive disorder, recurrent, moderate: Secondary | ICD-10-CM

## 2021-05-10 DIAGNOSIS — F4310 Post-traumatic stress disorder, unspecified: Secondary | ICD-10-CM

## 2021-05-10 DIAGNOSIS — G4700 Insomnia, unspecified: Secondary | ICD-10-CM

## 2021-05-10 MED ORDER — LORAZEPAM 1 MG PO TABS: 1.0000 mg | ORAL_TABLET | Freq: Two times a day (BID) | ORAL | 0 refills | Status: DC | PRN

## 2021-05-10 NOTE — Telephone Encounter (Signed)
I have sent lorazepam limited supply to her pharmacy.

## 2021-05-10 NOTE — Telephone Encounter (Signed)
received fax need refill from lorazepma 1mg 

## 2021-06-01 ENCOUNTER — Ambulatory Visit: Payer: Medicaid Other | Admitting: Cardiology

## 2021-06-01 NOTE — Progress Notes (Deleted)
Clinical Summary Ms. Duffett is a 50 y.o.female seen as new consult, referred by NP Hoffman for the following medical problems   Palpitations -    Past Medical History:  Diagnosis Date   Anger    Anxiety    Aortic insufficiency 2006   Noted at heart cath - mild to moderate   Arthritis    Asthma    Back pain    BV (bacterial vaginosis) 05/28/2015   Chronic diarrhea    Chronic headache    Depression    Diarrhea 05/28/2015   Dyslipidemia 01/19/2016   Fibroids, submucosal 10/24/2018   GERD (gastroesophageal reflux disease)    History of kidney stones    history of   History of nuclear stress test 07/15/2008   lexiscan; mild-mod perfusion defect in mid anteroseptal, apical anterior, apical septal regions (attenuation artifiact), prominent gut uptake activity in infero-apical region; post-stress EF 64%; EKG negative for ischemia; patient experienced CP during test   Hx of cardiac catheterization 2006   no significant CAD   IBS (irritable bowel syndrome)    Irritable bowel syndrome    Nausea 05/28/2015   Neck pain    Numerous moles 01/18/2016   RLQ abdominal pain 05/28/2015   Seizures ([place])    2 years ago; unknown etiology and none since then and on no meds   Vaginal discharge 05/28/2015   Weight gain 01/18/2016     Allergies  Allergen Reactions   Dicyclomine Palpitations    Patient states that her heart rate will go real fast when she takes this medication.   Other Other (See Comments)    House dust   Flagyl [Metronidazole Hcl] Nausea And Vomiting   Morphine Itching   Morphine And Related Other (See Comments)    Headache    Penicillins Other (See Comments)    Caused patient to pass out Did it involve swelling of the face/tongue/throat, SOB, or low BP? No Did it involve sudden or severe rash/hives, skin peeling, or any reaction on the inside of your mouth or nose? No Did you need to seek medical attention at a hospital or doctor's office? No When did it last happen?       20 + years If all above answers are "NO", may proceed with cephalosporin use.      Current Outpatient Medications  Medication Sig Dispense Refill   ARIPiprazole (ABILIFY) 5 MG tablet Take 1 tablet (5 mg total) by mouth daily. 90 tablet 0   aspirin EC 81 MG tablet Take 81 mg by mouth daily.  (Patient not taking: Reported on 04/06/2021)     cetirizine (ZYRTEC) 10 MG tablet cetirizine 10 mg tablet   10 mg by oral route.     DULoxetine (CYMBALTA) 30 MG capsule Total of 90 mg daily (take along with 60 mg) 90 capsule 0   DULoxetine (CYMBALTA) 60 MG capsule Take 1 capsule (60 mg total) by mouth daily. 90 capsule 0   fluticasone (FLONASE) 50 MCG/ACT nasal spray Place 2 sprays into both nostrils daily as needed for allergies.      IBU 400 MG tablet Take 400 mg by mouth 4 (four) times daily as needed.      loratadine (CLARITIN) 10 MG tablet Take 10 mg by mouth daily.      LORazepam (ATIVAN) 1 MG tablet Take 1 tablet (1 mg total) by mouth 2 (two) times daily as needed for anxiety. 14 tablet 0   naloxone (NARCAN) 4 MG/0.1ML LIQD nasal spray  kit Narcan 4 mg/actuation nasal spray  Take by nasal route every 3 minutes until patient awakes or EMS arrives. (Patient not taking: Reported on 04/06/2021)     ondansetron (ZOFRAN) 8 MG tablet TAKE 1 TABLET BY MOUTH EVERY 8 HOURS AS NEEDED FOR NAUSEA AND VOMITING. (Patient not taking: Reported on 04/06/2021) 20 tablet 0   oxyCODONE-acetaminophen (PERCOCET) 10-325 MG tablet Take 1 tablet by mouth every 6 (six) hours as needed for pain.      pantoprazole (PROTONIX) 40 MG tablet Take 1 tablet (40 mg total) by mouth 2 (two) times daily before a meal. 60 tablet 0   pregabalin (LYRICA) 75 MG capsule Take 75 mg by mouth 2 (two) times daily.     PROAIR HFA 108 (90 Base) MCG/ACT inhaler USE 2 PUFFS EVERY 6 HOURS AS NEEDED FOR WHEEZING OR SHORTNESS OF BREATH. (Patient taking differently: Inhale 2 puffs into the lungs every 6 (six) hours as needed for wheezing or shortness of  breath.) 8.5 g 0   promethazine (PHENERGAN) 25 MG tablet Take 25 mg by mouth every 6 (six) hours as needed for nausea or vomiting.      Tetrahydrozoline HCl (VISINE OP) Place 1 drop into both eyes daily as needed (dry / allergy eyes).     traZODone (DESYREL) 150 MG tablet Take 1 tablet (150 mg total) by mouth at bedtime. 90 tablet 0   No current facility-administered medications for this visit.     Past Surgical History:  Procedure Laterality Date   BIOPSY N/A 02/25/2014   Procedure: BIOPSY;  Surgeon: Rogene Houston, MD;  Location: [facility];  Service: Endoscopy;  Laterality: N/A;   BREAST ENHANCEMENT SURGERY     CARDIAC CATHETERIZATION  1025//2006   no significant CAD, mild-mod depressed LV systolic function, EF 16-10%, mild-mod AI (Dr. Gerrie Nordmann)    CARPAL TUNNEL RELEASE Bilateral 01/01/2016   CESAREAN SECTION     COLONOSCOPY  05/27/2011   snare polypectomy    COLONOSCOPY WITH PROPOFOL N/A 10/21/2016   Procedure: COLONOSCOPY WITH PROPOFOL;  Surgeon: Rogene Houston, MD;  Location: [facility];  Service: Endoscopy;  Laterality: N/A;  10:30   COLONOSCOPY WITH PROPOFOL N/A 08/02/2019   Procedure: COLONOSCOPY WITH PROPOFOL;  Surgeon: Rogene Houston, MD;  [facility]: [facility];  Service: Endoscopy;  Laterality: N/A;  7:30   ESOPHAGOGASTRODUODENOSCOPY N/A 02/25/2014   Procedure: ESOPHAGOGASTRODUODENOSCOPY (EGD);  Surgeon: Rogene Houston, MD;  [facility]: [facility];  Service: Endoscopy;  Laterality: N/A;  730   ESOPHAGOGASTRODUODENOSCOPY (EGD) WITH PROPOFOL N/A 08/02/2019   Procedure: ESOPHAGOGASTRODUODENOSCOPY (EGD) WITH PROPOFOL;  Surgeon: Rogene Houston, MD;  [facility];  Service: Endoscopy;  Laterality: N/A;   KNEE SURGERY Right    MASS EXCISION Right 10/21/2020   Procedure: EXCISION CYST 2 CM, RIGHT AXILLA  AND EXCISION OF RIGHT FLANK SKIN LESION;  Surgeon: Virl Cagey, MD;  Location: [facility];  Service: General;  Laterality: Right;   PARTIAL KNEE  ARTHROPLASTY Right 03/07/2018   Procedure: Right knee medial unicompartmental arthroplasty;  Surgeon: Gaynelle Arabian, MD;  Location: [facility];  Service: Orthopedics;  Laterality: Right;  with block   POLYPECTOMY  08/02/2019   Procedure: POLYPECTOMY;  Surgeon: Rogene Houston, MD;  [facility]: [facility];  Service: Endoscopy;;  cold snare distal sigmoid   TRANSTHORACIC ECHOCARDIOGRAM  08/6044   LV systolic function is low-normal; RV is normal in size & function; mild MR, mild TR   TUBAL LIGATION  Allergies  Allergen Reactions   Dicyclomine Palpitations    Patient states that her heart rate will go real fast when she takes this medication.   Other Other (See Comments)    House dust   Flagyl [Metronidazole Hcl] Nausea And Vomiting   Morphine Itching   Morphine And Related Other (See Comments)    Headache    Penicillins Other (See Comments)    Caused patient to pass out Did it involve swelling of the face/tongue/throat, SOB, or low BP? No Did it involve sudden or severe rash/hives, skin peeling, or any reaction on the inside of your mouth or nose? No Did you need to seek medical attention at a hospital or doctor's office? No When did it last happen?      20 + years If all above answers are "NO", may proceed with cephalosporin use.       Family History  Problem Relation Age of Onset   Other Mother        heart problems   Other Brother        neck and back surgery   Other Maternal Grandmother        brain tumor   Leukemia Maternal Grandfather    Dementia Father      Social History Ms. Heindl reports that she has been smoking cigarettes. She started smoking about 34 years ago. She has a 14.00 pack-year smoking history. She has never used smokeless tobacco. Ms. Guinther reports previous alcohol use of about 1.0 standard drink of alcohol per week.   Review of Systems CONSTITUTIONAL: No weight loss, fever, chills, weakness or fatigue.  HEENT: Eyes: No visual loss, blurred  vision, double vision or yellow sclerae.No hearing loss, sneezing, congestion, runny nose or sore throat.  SKIN: No rash or itching.  CARDIOVASCULAR:  RESPIRATORY: No shortness of breath, cough or sputum.  GASTROINTESTINAL: No anorexia, nausea, vomiting or diarrhea. No abdominal pain or blood.  GENITOURINARY: No burning on urination, no polyuria NEUROLOGICAL: No headache, dizziness, syncope, paralysis, ataxia, numbness or tingling in the extremities. No change in bowel or bladder control.  MUSCULOSKELETAL: No muscle, back pain, joint pain or stiffness.  LYMPHATICS: No enlarged nodes. No history of splenectomy.  PSYCHIATRIC: No history of depression or anxiety.  ENDOCRINOLOGIC: No reports of sweating, cold or heat intolerance. No polyuria or polydipsia.  Marland Kitchen   Physical Examination There were no vitals filed for this visit. There were no vitals filed for this visit.  Gen: resting comfortably, no acute distress HEENT: no scleral icterus, pupils equal round and reactive, no palptable cervical adenopathy,  CV Resp: Clear to auscultation bilaterally GI: abdomen is soft, non-tender, non-distended, normal bowel sounds, no hepatosplenomegaly MSK: extremities are warm, no edema.  Skin: warm, no rash Neuro:  no focal deficits Psych: appropriate affect   Diagnostic Studies     Assessment and Plan        Arnoldo Lenis, M.D., F.A.C.C.
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
## 2021-06-02 ENCOUNTER — Ambulatory Visit: Payer: Medicaid Other | Admitting: Urology

## 2021-06-03 ENCOUNTER — Telehealth (INDEPENDENT_AMBULATORY_CARE_PROVIDER_SITE_OTHER): Payer: Medicaid Other | Admitting: Psychiatry

## 2021-06-03 ENCOUNTER — Other Ambulatory Visit: Payer: Self-pay

## 2021-06-03 ENCOUNTER — Encounter: Payer: Self-pay | Admitting: Psychiatry

## 2021-06-03 DIAGNOSIS — F331 Major depressive disorder, recurrent, moderate: Secondary | ICD-10-CM

## 2021-06-03 DIAGNOSIS — F4310 Post-traumatic stress disorder, unspecified: Secondary | ICD-10-CM | POA: Diagnosis not present

## 2021-06-03 DIAGNOSIS — G4700 Insomnia, unspecified: Secondary | ICD-10-CM

## 2021-06-03 MED ORDER — LORAZEPAM 1 MG PO TABS: 1.0000 mg | ORAL_TABLET | Freq: Two times a day (BID) | ORAL | 1 refills | Status: DC | PRN

## 2021-06-03 MED ORDER — ARIPIPRAZOLE 5 MG PO TABS: 5.0000 mg | ORAL_TABLET | Freq: Every day | ORAL | 0 refills | Status: DC

## 2021-06-03 MED ORDER — TRAZODONE HCL 150 MG PO TABS: 150.0000 mg | ORAL_TABLET | Freq: Every day | ORAL | 1 refills | Status: DC

## 2021-06-03 MED ORDER — DULOXETINE HCL 30 MG PO CPEP: ORAL_CAPSULE | ORAL | 1 refills | Status: DC

## 2021-06-03 MED ORDER — DULOXETINE HCL 60 MG PO CPEP: 60.0000 mg | ORAL_CAPSULE | Freq: Every day | ORAL | 1 refills | Status: DC

## 2021-06-03 NOTE — Patient Instructions (Signed)
1. Continue duloxetine 90 mg daily 2. Continue Abilify 5 mg daily 3. Continue lorazepam 1 mg twice a day as needed for anxiety   4. Continue Trazodone 100 mg at night as needed for sleep 5. Next appointment: 9/15 at 4:30

## 2021-06-10 ENCOUNTER — Institutional Professional Consult (permissible substitution): Payer: Medicaid Other | Admitting: Neurology

## 2021-06-30 ENCOUNTER — Ambulatory Visit: Payer: Medicaid Other | Admitting: Cardiovascular Disease

## 2021-07-01 ENCOUNTER — Telehealth: Payer: Self-pay

## 2021-07-01 NOTE — Telephone Encounter (Signed)
[email or web address] requested a prior auth on the aripiprazole. - prior auth submitted - pending

## 2021-07-05 NOTE — Telephone Encounter (Signed)
prior Josem Kaufmann was approved until 07-01-22

## 2021-07-15 ENCOUNTER — Ambulatory Visit: Payer: Medicaid Other | Admitting: Cardiology

## 2021-07-26 ENCOUNTER — Ambulatory Visit: Payer: Medicaid Other | Admitting: Cardiovascular Disease

## 2021-07-28 ENCOUNTER — Ambulatory Visit: Payer: Medicaid Other | Admitting: Urology

## 2021-07-30 ENCOUNTER — Other Ambulatory Visit: Payer: Self-pay | Admitting: Psychiatry

## 2021-07-30 ENCOUNTER — Telehealth: Payer: Self-pay

## 2021-07-30 DIAGNOSIS — F331 Major depressive disorder, recurrent, moderate: Secondary | ICD-10-CM

## 2021-07-30 DIAGNOSIS — F4310 Post-traumatic stress disorder, unspecified: Secondary | ICD-10-CM

## 2021-07-30 MED ORDER — LORAZEPAM 1 MG PO TABS
1.0000 mg | ORAL_TABLET | Freq: Two times a day (BID) | ORAL | 0 refills | Status: DC | PRN
Start: 2021-07-30 — End: 2021-08-19

## 2021-07-30 NOTE — Telephone Encounter (Signed)
received fax requesting a refill on the lorazepam '1mg'$ 

## 2021-07-30 NOTE — Telephone Encounter (Signed)
Ordered.   I have utilized the [place] Controlled Substances Reporting System (PMP AWARxE) to confirm adherence regarding the patient's medication. My review reveals appropriate prescription fills.

## 2021-08-11 ENCOUNTER — Institutional Professional Consult (permissible substitution): Payer: Medicaid Other | Admitting: Neurology

## 2021-08-11 ENCOUNTER — Telehealth: Payer: Self-pay | Admitting: *Deleted

## 2021-08-11 ENCOUNTER — Encounter: Payer: Self-pay | Admitting: Neurology

## 2021-08-11 NOTE — Telephone Encounter (Signed)
Pt no showed appt today.  This is second no show. 3 PM also had a NS x2 I believe, pls check and send to [facility]

## 2021-08-12 ENCOUNTER — Encounter: Payer: Self-pay | Admitting: Neurology
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
## 2021-08-19 ENCOUNTER — Other Ambulatory Visit: Payer: Self-pay

## 2021-08-19 ENCOUNTER — Telehealth (INDEPENDENT_AMBULATORY_CARE_PROVIDER_SITE_OTHER): Payer: Medicaid Other | Admitting: Psychiatry

## 2021-08-19 ENCOUNTER — Encounter: Payer: Self-pay | Admitting: Psychiatry

## 2021-08-19 DIAGNOSIS — G4700 Insomnia, unspecified: Secondary | ICD-10-CM | POA: Diagnosis not present

## 2021-08-19 DIAGNOSIS — F4310 Post-traumatic stress disorder, unspecified: Secondary | ICD-10-CM

## 2021-08-19 DIAGNOSIS — F331 Major depressive disorder, recurrent, moderate: Secondary | ICD-10-CM | POA: Diagnosis not present

## 2021-08-19 MED ORDER — LORAZEPAM 1 MG PO TABS: 1.0000 mg | ORAL_TABLET | Freq: Two times a day (BID) | ORAL | 1 refills | Status: DC | PRN

## 2021-08-19 NOTE — Patient Instructions (Signed)
1. Increase duloxetine 120 mg daily  2. Continue Abilify 5 mg daily  3. Continue lorazepam 1 mg twice a day as needed for anxiety   4. Continue Trazodone 100 mg at night as needed for sleep 5. Next appointment: 10/27 at 2:30

## 2021-09-07 ENCOUNTER — Ambulatory Visit: Payer: Medicaid Other | Admitting: Cardiovascular Disease

## 2021-09-20 NOTE — Progress Notes (Deleted)
Cardiology Office Note:    Date:  09/20/2021   ID:  Julie [name], DOB [birth year], MRN [id number]  PCP:  Glenda Chroman, MD   [facility] Providers Cardiologist:  None { Click to update primary MD,subspecialty MD or APP then REFRESH:1}    Referring MD: Annamarie Dawley, NP   CC: *** Consulted for the evaluation of palpitations at the behest of Glenda Chroman, MD   History of Present Illness:    Julie [name] is a 50 y.o. female with a hx of Tobacco abuse, Palpitations 09/21/21.  Patient notes that she is feeling ***.  Has had no chest pain, chest pressure, chest tightness, chest stinging ***.  Discomfort occurs with ***, worsens with ***, and improves with ***.  Patient exertion notable for *** with *** and feels no symptoms.  No shortness of breath, DOE ***.  No PND or orthopnea***.  No weight gain***, leg swelling ***, or abdominal swelling***.  No syncope or near syncope ***. Notes *** no palpitations or funny heart beats.   No leg claudication.  Patient reports prior cardiac testing including *** echo, *** stress test, *** heart catheterizations, *** cardioversion, *** ablations.  No history of ***pre-eclampsia, gestation HTN or gestational DM.  No Fen-Phen or drug use***.  Ambulatory BP ***.   Past Medical History:  Diagnosis Date   Anger    Anxiety    Aortic insufficiency 2006   Noted at heart cath - mild to moderate   Arthritis    Asthma    Back pain    BV (bacterial vaginosis) 05/28/2015   Chronic diarrhea    Chronic headache    Depression    Diarrhea 05/28/2015   Dyslipidemia 01/19/2016   Fibroids, submucosal 10/24/2018   GERD (gastroesophageal reflux disease)    History of kidney stones    history of   History of nuclear stress test 07/15/2008   lexiscan; mild-mod perfusion defect in mid anteroseptal, apical anterior, apical septal regions (attenuation artifiact), prominent gut uptake activity in infero-apical region; post-stress EF 64%; EKG negative  for ischemia; patient experienced CP during test   Hx of cardiac catheterization 2006   no significant CAD   IBS (irritable bowel syndrome)    Irritable bowel syndrome    Nausea 05/28/2015   Neck pain    Numerous moles 01/18/2016   RLQ abdominal pain 05/28/2015   Seizures ([place])    2 years ago; unknown etiology and none since then and on no meds   Vaginal discharge 05/28/2015   Weight gain 01/18/2016    Past Surgical History:  Procedure Laterality Date   BIOPSY N/A 02/25/2014   Procedure: BIOPSY;  Surgeon: Rogene Houston, MD;  [facility]: [facility];  Service: Endoscopy;  Laterality: N/A;   BREAST ENHANCEMENT SURGERY     CARDIAC CATHETERIZATION  1025//2006   no significant CAD, mild-mod depressed LV systolic function, EF 85-46%, mild-mod AI (Dr. Gerrie Nordmann)    CARPAL TUNNEL RELEASE Bilateral 01/01/2016   CESAREAN SECTION     COLONOSCOPY  05/27/2011   snare polypectomy    COLONOSCOPY WITH PROPOFOL N/A 10/21/2016   Procedure: COLONOSCOPY WITH PROPOFOL;  Surgeon: Rogene Houston, MD;  Location: [facility];  Service: Endoscopy;  Laterality: N/A;  10:30   COLONOSCOPY WITH PROPOFOL N/A 08/02/2019   Procedure: COLONOSCOPY WITH PROPOFOL;  Surgeon: Rogene Houston, MD;  [facility]: [facility];  Service: Endoscopy;  Laterality: N/A;  7:30   ESOPHAGOGASTRODUODENOSCOPY N/A 02/25/2014   Procedure:  ESOPHAGOGASTRODUODENOSCOPY (EGD);  Surgeon: Rogene Houston, MD;  [facility];  Service: Endoscopy;  Laterality: N/A;  730   ESOPHAGOGASTRODUODENOSCOPY (EGD) WITH PROPOFOL N/A 08/02/2019   Procedure: ESOPHAGOGASTRODUODENOSCOPY (EGD) WITH PROPOFOL;  Surgeon: Rogene Houston, MD;  [facility];  Service: Endoscopy;  Laterality: N/A;   KNEE SURGERY Right    MASS EXCISION Right 10/21/2020   Procedure: EXCISION CYST 2 CM, RIGHT AXILLA  AND EXCISION OF RIGHT FLANK SKIN LESION;  Surgeon: Virl Cagey, MD;  Location: [facility];  Service: General;  Laterality: Right;   PARTIAL KNEE  ARTHROPLASTY Right 03/07/2018   Procedure: Right knee medial unicompartmental arthroplasty;  Surgeon: Gaynelle Arabian, MD;  Location: [facility];  Service: Orthopedics;  Laterality: Right;  with block   POLYPECTOMY  08/02/2019   Procedure: POLYPECTOMY;  Surgeon: Rogene Houston, MD;  [facility];  Service: Endoscopy;;  cold snare distal sigmoid   TRANSTHORACIC ECHOCARDIOGRAM  12/6107   LV systolic function is low-normal; RV is normal in size & function; mild MR, mild TR   TUBAL LIGATION      Current Medications: No outpatient medications have been marked as taking for the 09/21/21 encounter (Appointment) with Werner Lean, MD.     Allergies:   Dicyclomine, Other, Flagyl [metronidazole hcl], Morphine, Morphine and related, and Penicillins   Social History   Socioeconomic History   Marital status: Single    Spouse name: Not on file   Number of children: 4   Years of education: 9   Highest education level: Not on file  Occupational History    Employer: [employer]  Tobacco Use   Smoking status: Every Day    Packs/day: 0.50    Years: 28.00    Pack years: 14.00    Types: Cigarettes    Start date: 10/11/1986   Smokeless tobacco: Never  Vaping Use   Vaping Use: Former   Substances: Nicotine  Substance and Sexual Activity   Alcohol use: Not Currently    Alcohol/week: 1.0 standard drink    Types: 1 Glasses of wine per week    Comment: occasionally    Drug use: Not Currently    Types: Marijuana    Comment: twice a week   Sexual activity: Yes    Birth control/protection: Surgical    Comment: tubal  Other Topics Concern   Not on file  Social History Narrative   Not on file   Social Determinants of Health   Financial Resource Strain: Medium Risk   Difficulty of Paying Living Expenses: Somewhat hard  Food Insecurity: Food Insecurity Present   Worried About Estate manager/land agent of Food in the Last Year: Sometimes true   Ran Out of Food in the Last Year: Sometimes true   Transportation Needs: No Transportation Needs   Lack of Transportation (Medical): No   Lack of Transportation (Non-Medical): No  Physical Activity: Insufficiently Active   Days of Exercise per Week: 2 days   Minutes of Exercise per Session: 20 min  Stress: Stress Concern Present   Feeling of Stress : To some extent  Social Connections: Unknown   Frequency of Communication with Friends and Family: More than three times a week   Frequency of Social Gatherings with Friends and Family: Twice a week   Attends Religious Services: Never   Marine scientist or Organizations: No   Attends Music therapist: Not on file   Marital Status: Patient refused     Family  History: The patient's ***family history includes Dementia in her father; Leukemia in her maternal grandfather; Other in her brother, maternal grandmother, and mother.  ROS:   Please see the history of present illness.    *** All other systems reviewed and are negative.  EKGs/Labs/Other Studies Reviewed:    The following studies were reviewed today: ***  EKG:  EKG is *** ordered today.  The ekg ordered today demonstrates ***  Cardiac Event Monitoring: Date:10/25/2015 Results: SR and Sinus arrhythmia, ST, no change from 2009 by report  Transthoracic Echocardiogram: Date: 10/15/2015 Results: Study Conclusions   - Left ventricle: The cavity size was normal. Systolic function was    normal. The estimated ejection fraction was in the range of 55%    to 60%. Wall motion was normal; there were no regional wall    motion abnormalities. Left ventricular diastolic function    parameters were normal.  - Aortic valve: Mildly to moderately calcified annulus.  No AI images reviewed  Left/Right Heart Catheterizations: Date: 09/28/2005 Results: Likely normal per report  Recent Labs: No results found for requested labs within last 8760 hours.  Recent Lipid Panel    Component Value Date/Time   CHOL 189  08/08/2017 1636   TRIG 244 (H) 08/08/2017 1636   HDL 40 08/08/2017 1636   CHOLHDL 4.7 (H) 08/08/2017 1636   LDLCALC 100 (H) 08/08/2017 1636    Physical Exam:    VS:  LMP  (LMP Unknown)     Wt Readings from Last 3 Encounters:  04/06/21 139 lb (63 kg)  11/05/20 135 lb (61.2 kg)  09/17/20 128 lb (58.1 kg)     GEN: *** Well nourished, well developed in no acute distress HEENT: Normal NECK: No JVD; No carotid bruits LYMPHATICS: No lymphadenopathy CARDIAC: ***RRR, no murmurs, rubs, gallops RESPIRATORY:  Clear to auscultation without rales, wheezing or rhonchi  ABDOMEN: Soft, non-tender, non-distended MUSCULOSKELETAL:  No edema; No deformity  SKIN: Warm and dry NEUROLOGIC:  Alert and oriented x 3 PSYCHIATRIC:  Normal affect   ASSESSMENT:    No diagnosis found. PLAN:    Palpitations; possible SVT PACs/PVCs*** - will obtain ***-day live/non live heart monitor (ZioPatch/[employer]ice) - AV Nodal Therapy: *** - AAD: - EP evaluation:    Question of AI If sx- echo   {Are you ordering a CV Procedure (e.g. stress test, cath, DCCV, TEE, etc)?   Press F2        :[id number]}    Medication Adjustments/Labs and Tests Ordered: Current medicines are reviewed at length with the patient today.  Concerns regarding medicines are outlined above.  No orders of the defined types were placed in this encounter.  No orders of the defined types were placed in this encounter.   There are no Patient Instructions on file for this visit.   Signed, Werner Lean, MD  09/20/2021 12:54 PM    [facility]

## 2021-09-21 ENCOUNTER — Ambulatory Visit: Payer: Medicaid Other | Admitting: Internal Medicine
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
## 2021-09-29 ENCOUNTER — Other Ambulatory Visit: Payer: Self-pay

## 2021-09-29 ENCOUNTER — Ambulatory Visit (INDEPENDENT_AMBULATORY_CARE_PROVIDER_SITE_OTHER): Payer: Medicaid Other | Admitting: Urology

## 2021-09-29 ENCOUNTER — Ambulatory Visit (INDEPENDENT_AMBULATORY_CARE_PROVIDER_SITE_OTHER): Payer: Medicaid Other

## 2021-09-29 ENCOUNTER — Encounter: Payer: Self-pay | Admitting: Cardiology

## 2021-09-29 ENCOUNTER — Ambulatory Visit (INDEPENDENT_AMBULATORY_CARE_PROVIDER_SITE_OTHER): Payer: Medicaid Other | Admitting: Cardiology

## 2021-09-29 ENCOUNTER — Encounter: Payer: Self-pay | Admitting: Urology

## 2021-09-29 VITALS — BP 132/78 | HR 69 | Ht 60.0 in | Wt 131.4 lb

## 2021-09-29 VITALS — BP 108/70 | HR 86 | Ht 60.0 in | Wt 131.1 lb

## 2021-09-29 DIAGNOSIS — R339 Retention of urine, unspecified: Secondary | ICD-10-CM

## 2021-09-29 DIAGNOSIS — R002 Palpitations: Secondary | ICD-10-CM | POA: Diagnosis not present

## 2021-09-29 DIAGNOSIS — Z720 Tobacco use: Secondary | ICD-10-CM

## 2021-09-29 DIAGNOSIS — R32 Unspecified urinary incontinence: Secondary | ICD-10-CM | POA: Diagnosis not present

## 2021-09-29 DIAGNOSIS — I359 Nonrheumatic aortic valve disorder, unspecified: Secondary | ICD-10-CM | POA: Insufficient documentation

## 2021-09-29 LAB — BLADDER SCAN AMB NON-IMAGING: Scan Result: 348

## 2021-09-29 MED ORDER — TAMSULOSIN HCL 0.4 MG PO CAPS: 0.4000 mg | ORAL_CAPSULE | Freq: Every day | ORAL | 11 refills | Status: DC

## 2021-09-29 NOTE — Progress Notes (Signed)
Urological Symptom Review  Patient is experiencing the following symptoms: Hard to postpone urination Have to strain to urinate   Review of Systems  Gastrointestinal (upper)  : Indigestion/heartburn  Gastrointestinal (lower) : Negative for lower GI symptoms  Constitutional : Negative for symptoms  Skin: Negative for skin symptoms  Eyes: Negative for eye symptoms  Ear/Nose/Throat : Sinus problems  Hematologic/Lymphatic: Negative for Hematologic/Lymphatic symptoms  Cardiovascular : Negative for cardiovascular symptoms  Respiratory : Negative for respiratory symptoms  Endocrine: Excessive thirst  Musculoskeletal: Back pain Joint pain  Neurological: Negative for neurological symptoms  Psychologic: Depression Anxiety

## 2021-09-29 NOTE — Progress Notes (Signed)
post void residual=348

## 2021-09-29 NOTE — Assessment & Plan Note (Signed)
Aortic valve atherosclerosis noted previously on echocardiogram.  We will recheck.

## 2021-09-29 NOTE — Patient Instructions (Addendum)
Medication Instructions:   Your physician recommends that you continue on your current medications as directed. Please refer to the Current Medication list given to you today.   *If you need a refill on your cardiac medications before your next appointment, please call your pharmacy*   Lab Work: [place]   If you have labs (blood work) drawn today and your tests are completely normal, you will receive your results only by: [place] (if you have MyChart) OR A paper copy in the mail If you have any lab test that is abnormal or we need to change your treatment, we will call you to review the results.   Testing/Procedures: Your physician has requested that you have an echocardiogram. Echocardiography is a painless test that uses sound waves to create images of your heart. It provides your doctor with information about the size and shape of your heart and how well your heart's chambers and valves are working. This procedure takes approximately one hour. There are no restrictions for this procedure.   Your physician has recommended that you wear an event monitor. Event monitors are medical devices that record the heart's electrical activity. Doctors most often [place] these monitors to diagnose arrhythmias. Arrhythmias are problems with the speed or rhythm of the heartbeat. The monitor is a small, portable device. You can wear one while you do your normal daily activities. This is usually used to diagnose what is causing palpitations/syncope (passing out).   Follow-Up: At [facility], you and your health needs are our priority.  As part of our continuing mission to provide you with exceptional heart care, we have created designated Provider Care Teams.  These Care Teams include your primary Cardiologist (physician) and Advanced Practice Providers (APPs -  Physician Assistants and Nurse Practitioners) who all work together to provide you with the care you need, when you need it.  We  recommend signing up for the patient portal called "MyChart".  Sign up information is provided on this After Visit Summary.  MyChart is used to connect with patients for Virtual Visits (Telemedicine).  Patients are able to view lab/test results, encounter notes, upcoming appointments, etc.  Non-urgent messages can be sent to your provider as well.   To learn more about what you can do with MyChart, go to [email or web address].    Your next appointment:  CONTACT [facility]ART CARE [phone] AS NEEDED FOR  ANY CARDIAC RELATED SYMPTOMS

## 2021-09-29 NOTE — Assessment & Plan Note (Signed)
Discussed tobacco cessation with her.  Encouraged.

## 2021-09-29 NOTE — Patient Instructions (Signed)
Acute Urinary Retention, Female Acute urinary retention is when a person cannot pee (urinate) at all, or can only pee a little. This can come on all of a sudden. If it is not treated, it can lead to kidney problems or other serious problems. What are the causes? A problem with the tube that drains the bladder (urethra). Problems with the nerves in the bladder. The organs in the area between your hip bones (pelvis) slipping out of place (prolapse). Tumors. The birth of a baby through the vagina. An infection. Having trouble pooping (constipation). Certain medicines. What increases the risk? Women over age 64 are more at risk. Other conditions also can increase risk. These include: Diseases, such as multiple sclerosis. Injury to the spinal cord. Diabetes. A condition that affects the way the brain works, such as dementia. Holding back urine due to trauma or because you do not want to use the bathroom. History of not being able to pee or peeing too little. Having had surgery in the area between your hip bones. What are the signs or symptoms? Trouble peeing. Pain in the lower belly. How is this treated? Treatment for this condition may include: Medicines. Placing a thin, germ-free tube (catheter) into the bladder to drain pee out of the body. Therapy to treat mental health conditions. Treatment for conditions that may cause this. If needed, you may be treated in the hospital for kidney problems or to manage other problems. Follow these instructions at home: Medicines Take over-the-counter and prescription medicines only as told by your doctor. Ask your doctor what medicines you should stay away from. If you were given an antibiotic medicine, take it as told by your doctor. Do not stop taking it even if you start to feel better. General instructions Do not smoke or use any products that contain nicotine or tobacco. If you need help quitting, ask your doctor. Drink enough fluid to keep  your pee pale yellow. If you were sent home with a tube that drains the bladder, take care of it as told by your doctor. Watch for changes in your symptoms. Tell your doctor about them. If told, keep track of changes in your blood pressure at home. Tell your doctor about them. Keep all follow-up visits. Contact a doctor if: You have spasms in your bladder that you cannot stop. You leak pee when you have spasms. Get help right away if: You have chills or a fever. You have blood in your pee. You have a tube that drains pee from the bladder and these things happen: The tube stops draining pee. The tube falls out. Summary Acute urinary retention is when you cannot pee at all or you pee too little. If this is not treated, it can cause kidney problems or other serious problems. If you were sent home with a tube (catheter) that drains pee from the bladder, take care of it as told by your doctor. Watch for changes in your symptoms. Tell your doctor about them. This information is not intended to replace advice given to you by your health care provider. Make sure you discuss any questions you have with your health care provider. Document Revised: 08/12/2020 Document Reviewed: 08/12/2020 Elsevier Patient Education  2022 [employer].

## 2021-09-29 NOTE — Progress Notes (Signed)
09/29/2021 3:30 PM   Julie [name] 1971-04-28 [id number]  Referring provider: Glenda Chroman, MD [address] [place],  [place] [address]  Urinary urgency    HPI: Julie [name] is a 50yo here for evaluation of urinary urgency. For the past year she has noted worsening urinary urgency with associated urge incontinence. She was for a UTI 2 months ago which improved her urgency and urge incontinence. She has a hx of cervical and lumbar disc disease. No numbness/tingling in fingers or toes. For the past 2 weeks she has noted straining to urinate, weak urinary stream and starting/stopping. PVR 348cc. She does not feel her bladder is full.    PMH: Past Medical History:  Diagnosis Date   Anger    Anxiety    Aortic insufficiency 2006   Noted at heart cath - mild to moderate   Arthritis    Asthma    Back pain    BV (bacterial vaginosis) 05/28/2015   Chronic diarrhea    Chronic headache    Depression    Diarrhea 05/28/2015   Dyslipidemia 01/19/2016   Fibroids, submucosal 10/24/2018   GERD (gastroesophageal reflux disease)    History of kidney stones    history of   History of nuclear stress test 07/15/2008   lexiscan; mild-mod perfusion defect in mid anteroseptal, apical anterior, apical septal regions (attenuation artifiact), prominent gut uptake activity in infero-apical region; post-stress EF 64%; EKG negative for ischemia; patient experienced CP during test   Hx of cardiac catheterization 2006   no significant CAD   IBS (irritable bowel syndrome)    Irritable bowel syndrome    Nausea 05/28/2015   Neck pain    Numerous moles 01/18/2016   RLQ abdominal pain 05/28/2015   Seizures ([place])    2 years ago; unknown etiology and none since then and on no meds   Vaginal discharge 05/28/2015   Weight gain 01/18/2016    Surgical History: Past Surgical History:  Procedure Laterality Date   BIOPSY N/A 02/25/2014   Procedure: BIOPSY;  Surgeon: Rogene Houston, MD;  [facility];  Service:  Endoscopy;  Laterality: N/A;   BREAST ENHANCEMENT SURGERY     CARDIAC CATHETERIZATION  1025//2006   no significant CAD, mild-mod depressed LV systolic function, EF 24-82%, mild-mod AI (Dr. Gerrie Nordmann)    CARPAL TUNNEL RELEASE Bilateral 01/01/2016   CESAREAN SECTION     COLONOSCOPY  05/27/2011   snare polypectomy    COLONOSCOPY WITH PROPOFOL N/A 10/21/2016   Procedure: COLONOSCOPY WITH PROPOFOL;  Surgeon: Rogene Houston, MD;  [facility];  Service: Endoscopy;  Laterality: N/A;  10:30   COLONOSCOPY WITH PROPOFOL N/A 08/02/2019   Procedure: COLONOSCOPY WITH PROPOFOL;  Surgeon: Rogene Houston, MD;  [facility];  Service: Endoscopy;  Laterality: N/A;  7:30   ESOPHAGOGASTRODUODENOSCOPY N/A 02/25/2014   Procedure: ESOPHAGOGASTRODUODENOSCOPY (EGD);  Surgeon: Rogene Houston, MD;  [facility];  Service: Endoscopy;  Laterality: N/A;  730   ESOPHAGOGASTRODUODENOSCOPY (EGD) WITH PROPOFOL N/A 08/02/2019   Procedure: ESOPHAGOGASTRODUODENOSCOPY (EGD) WITH PROPOFOL;  Surgeon: Rogene Houston, MD;  [facility];  Service: Endoscopy;  Laterality: N/A;   KNEE SURGERY Right    MASS EXCISION Right 10/21/2020   Procedure: EXCISION CYST 2 CM, RIGHT AXILLA  AND EXCISION OF RIGHT FLANK SKIN LESION;  Surgeon: Virl Cagey, MD;  [facility];  Service: General;  Laterality: Right;   PARTIAL KNEE ARTHROPLASTY Right 03/07/2018  Procedure: Right knee medial unicompartmental arthroplasty;  Surgeon: Gaynelle Arabian, MD;  [facility];  Service: Orthopedics;  Laterality: Right;  with block   POLYPECTOMY  08/02/2019   Procedure: POLYPECTOMY;  Surgeon: Rogene Houston, MD;  [facility];  Service: Endoscopy;;  cold snare distal sigmoid   TRANSTHORACIC ECHOCARDIOGRAM  05/9484   LV systolic function is low-normal; RV is normal in size & function; mild MR, mild TR   TUBAL LIGATION      Home Medications:  Allergies as of 09/29/2021       Reactions    Dicyclomine Palpitations   Patient states that her heart rate will go real fast when she takes this medication.   Other Other (See Comments)   House dust   Flagyl [metronidazole Hcl] Nausea And Vomiting   Morphine Itching   Morphine And Related Other (See Comments)   Headache    Penicillins Other (See Comments)   Caused patient to pass out Did it involve swelling of the face/tongue/throat, SOB, or low BP? No Did it involve sudden or severe rash/hives, skin peeling, or any reaction on the inside of your mouth or nose? No Did you need to seek medical attention at a hospital or doctor's office? No When did it last happen?      20 + years If all above answers are "NO", may proceed with cephalosporin use.        Medication List        Accurate as of September 29, 2021  3:30 PM. If you have any questions, ask your nurse or doctor.          ARIPiprazole 5 MG tablet Commonly known as: ABILIFY Take 1 tablet (5 mg total) by mouth daily.   aspirin EC 81 MG tablet Take 81 mg by mouth daily.   cetirizine 10 MG tablet Commonly known as: ZYRTEC cetirizine 10 mg tablet   10 mg by oral route.   DULoxetine 30 MG capsule Commonly known as: CYMBALTA Total of 90 mg daily (take along with 60 mg)   DULoxetine 60 MG capsule Commonly known as: Cymbalta Take 1 capsule (60 mg total) by mouth daily.   fluticasone 50 MCG/ACT nasal spray Commonly known as: FLONASE Place 2 sprays into both nostrils daily as needed for allergies.   IBU 400 MG tablet Generic drug: ibuprofen Take 400 mg by mouth 4 (four) times daily as needed.   loratadine 10 MG tablet Commonly known as: CLARITIN Take 10 mg by mouth daily.   LORazepam 1 MG tablet Commonly known as: ATIVAN Take 1 tablet (1 mg total) by mouth 2 (two) times daily as needed for anxiety.   Narcan 4 MG/0.1ML Liqd nasal spray kit Generic drug: naloxone   ondansetron 8 MG tablet Commonly known as: ZOFRAN TAKE 1 TABLET BY MOUTH EVERY 8 HOURS  AS NEEDED FOR NAUSEA AND VOMITING.   oxyCODONE-acetaminophen 10-325 MG tablet Commonly known as: PERCOCET Take 1 tablet by mouth every 6 (six) hours as needed for pain.   pantoprazole 40 MG tablet Commonly known as: Protonix Take 1 tablet (40 mg total) by mouth 2 (two) times daily before a meal.   pregabalin 75 MG capsule Commonly known as: LYRICA Take 75 mg by mouth 2 (two) times daily.   ProAir HFA 108 (90 Base) MCG/ACT inhaler Generic drug: albuterol USE 2 PUFFS EVERY 6 HOURS AS NEEDED FOR WHEEZING OR SHORTNESS OF BREATH. What changed: See the new instructions.   promethazine 25 MG tablet Commonly  known as: PHENERGAN Take 25 mg by mouth every 6 (six) hours as needed for nausea or vomiting.   traZODone 150 MG tablet Commonly known as: DESYREL Take 1 tablet (150 mg total) by mouth at bedtime.   VISINE OP Place 1 drop into both eyes daily as needed (dry / allergy eyes).        Allergies:  Allergies  Allergen Reactions   Dicyclomine Palpitations    Patient states that her heart rate will go real fast when she takes this medication.   Other Other (See Comments)    House dust   Flagyl [Metronidazole Hcl] Nausea And Vomiting   Morphine Itching   Morphine And Related Other (See Comments)    Headache    Penicillins Other (See Comments)    Caused patient to pass out Did it involve swelling of the face/tongue/throat, SOB, or low BP? No Did it involve sudden or severe rash/hives, skin peeling, or any reaction on the inside of your mouth or nose? No Did you need to seek medical attention at a hospital or doctor's office? No When did it last happen?      20 + years If all above answers are "NO", may proceed with cephalosporin use.     Family History: Family History  Problem Relation Age of Onset   Other Mother        heart problems   Other Brother        neck and back surgery   Other Maternal Grandmother        brain tumor   Leukemia Maternal Grandfather     Dementia Father     Social History:  reports that she has been smoking cigarettes. She started smoking about 34 years ago. She has a 14.00 pack-year smoking history. She has never used smokeless tobacco. She reports that she does not currently use alcohol after a past usage of about 1.0 standard drink per week. She reports that she does not currently use drugs after having used the following drugs: Marijuana.  ROS: All other review of systems were reviewed and are negative except what is noted above in HPI  Physical Exam: BP 108/70   Pulse 86   Ht 5' (1.524 m)   Wt 131 lb 2 oz (59.5 kg)   LMP  (LMP Unknown)   BMI 25.61 kg/m   Constitutional:  Alert and oriented, No acute distress. HEENT: [place] AT, moist mucus membranes.  Trachea midline, no masses. Cardiovascular: No clubbing, cyanosis, or edema. Respiratory: Normal respiratory effort, no increased work of breathing. GI: Abdomen is soft, nontender, nondistended, no abdominal masses GU: No CVA tenderness.  Lymph: No cervical or inguinal lymphadenopathy. Skin: No rashes, bruises or suspicious lesions. Neurologic: Grossly intact, no focal deficits, moving all 4 extremities. Psychiatric: Normal mood and affect.  Laboratory Data: Lab Results  Component Value Date   WBC 12.3 (H) 10/11/2018   HGB 10.2 (L) 10/11/2018   HCT 32.3 (L) 10/11/2018   MCV 90 10/11/2018   PLT 411 10/11/2018    Lab Results  Component Value Date   CREATININE 0.68 03/08/2018    No results found for: PSA  No results found for: TESTOSTERONE  Lab Results  Component Value Date   HGBA1C 5.4 08/08/2017    Urinalysis    Component Value Date/Time   COLORURINE YELLOW 01/03/2017 1222   APPEARANCEUR HAZY (A) 01/03/2017 1222   LABSPEC 1.015 01/03/2017 1222   PHURINE 6.0 01/03/2017 [place] 01/03/2017 1222  HGBUR MODERATE (A) 01/03/2017 1222   BILIRUBINUR NEGATIVE 01/03/2017 1222   KETONESUR 20 (A) 01/03/2017 1222   PROTEINUR NEGATIVE  01/03/2017 1222   UROBILINOGEN 0.2 06/04/2015 1258   NITRITE NEGATIVE 01/03/2017 1222   LEUKOCYTESUR NEGATIVE 01/03/2017 1222    Lab Results  Component Value Date   BACTERIA NONE SEEN 01/03/2017    Pertinent Imaging:  No results found for this or any previous visit.  No results found for this or any previous visit.  No results found for this or any previous visit.  No results found for this or any previous visit.  No results found for this or any previous visit.  No results found for this or any previous visit.  No results found for this or any previous visit.  No results found for this or any previous visit.   Assessment & Plan:    1. Urinary incontinence, unspecified type -likely related to incomplete emptying - Urinalysis, Routine w reflex microscopic - BLADDER SCAN AMB NON-IMAGING  2. Incomplete bladder emptying -Floamx 0.28m daily   No follow-ups on file.  PNicolette Bang MD  C[facility] IncUrology [place]

## 2021-09-29 NOTE — Assessment & Plan Note (Signed)
We will go ahead and check a Zio patch monitor for 2 weeks to ensure that she does not have any adverse arrhythmias.

## 2021-09-29 NOTE — Progress Notes (Signed)
Cardiology Office Note:    Date:  09/29/2021   ID:  Julie [name], DOB 1971-05-03, MRN [id number]  PCP:  Glenda Chroman, MD   [place] Providers Cardiologist:  None     Referring MD: Annamarie Dawley, NP     History of Present Illness:    Julie [name] is a 50 y.o. female here for the evaluation of palpitations at the request of Jenita Seashore, NP.  She has had prior remote heart catheterization in 2006 as well as a nuclear stress test in 2009 and 2015 which demonstrated no significant coronary artery disease.  He was decree sensitivity secondary to breast implants.  She had mild to moderate aortic insufficiency reported by heart catheterization as well.  In 2016 an echocardiogram was performed and showed some calcification of the aortic valve mild but no evidence of aortic regurgitation.  Palpitations feel "real steady beat for a few minutes". She knows it is not anxiety. Just feels really tired. Some dizziness. No syncope. No real chest pain.  Smoker - slowed down a whole lot. 1/2 PPD.    Past Medical History:  Diagnosis Date   Anger    Anxiety    Aortic insufficiency 2006   Noted at heart cath - mild to moderate   Arthritis    Asthma    Back pain    BV (bacterial vaginosis) 05/28/2015   Chronic diarrhea    Chronic headache    Depression    Diarrhea 05/28/2015   Dyslipidemia 01/19/2016   Fibroids, submucosal 10/24/2018   GERD (gastroesophageal reflux disease)    History of kidney stones    history of   History of nuclear stress test 07/15/2008   lexiscan; mild-mod perfusion defect in mid anteroseptal, apical anterior, apical septal regions (attenuation artifiact), prominent gut uptake activity in infero-apical region; post-stress EF 64%; EKG negative for ischemia; patient experienced CP during test   Hx of cardiac catheterization 2006   no significant CAD   IBS (irritable bowel syndrome)    Irritable bowel syndrome    Nausea 05/28/2015   Neck pain     Numerous moles 01/18/2016   RLQ abdominal pain 05/28/2015   Seizures ([place])    2 years ago; unknown etiology and none since then and on no meds   Vaginal discharge 05/28/2015   Weight gain 01/18/2016    Past Surgical History:  Procedure Laterality Date   BIOPSY N/A 02/25/2014   Procedure: BIOPSY;  Surgeon: Rogene Houston, MD;  [facility];  Service: Endoscopy;  Laterality: N/A;   BREAST ENHANCEMENT SURGERY     CARDIAC CATHETERIZATION  1025//2006   no significant CAD, mild-mod depressed LV systolic function, EF 39-53%, mild-mod AI (Dr. Gerrie Nordmann)    CARPAL TUNNEL RELEASE Bilateral 01/01/2016   CESAREAN SECTION     COLONOSCOPY  05/27/2011   snare polypectomy    COLONOSCOPY WITH PROPOFOL N/A 10/21/2016   Procedure: COLONOSCOPY WITH PROPOFOL;  Surgeon: Rogene Houston, MD;  [facility];  Service: Endoscopy;  Laterality: N/A;  10:30   COLONOSCOPY WITH PROPOFOL N/A 08/02/2019   Procedure: COLONOSCOPY WITH PROPOFOL;  Surgeon: Rogene Houston, MD;  [facility];  Service: Endoscopy;  Laterality: N/A;  7:30   ESOPHAGOGASTRODUODENOSCOPY N/A 02/25/2014   Procedure: ESOPHAGOGASTRODUODENOSCOPY (EGD);  Surgeon: Rogene Houston, MD;  [facility];  Service: Endoscopy;  Laterality: N/A;  730   ESOPHAGOGASTRODUODENOSCOPY (EGD) WITH PROPOFOL N/A 08/02/2019   Procedure: ESOPHAGOGASTRODUODENOSCOPY (EGD) WITH  PROPOFOL;  Surgeon: Rogene Houston, MD;  [facility];  Service: Endoscopy;  Laterality: N/A;   KNEE SURGERY Right    MASS EXCISION Right 10/21/2020   Procedure: EXCISION CYST 2 CM, RIGHT AXILLA  AND EXCISION OF RIGHT FLANK SKIN LESION;  Surgeon: Virl Cagey, MD;  [facility];  Service: General;  Laterality: Right;   PARTIAL KNEE ARTHROPLASTY Right 03/07/2018   Procedure: Right knee medial unicompartmental arthroplasty;  Surgeon: Gaynelle Arabian, MD;  [facility];  Service: Orthopedics;  Laterality: Right;  with block   POLYPECTOMY   08/02/2019   Procedure: POLYPECTOMY;  Surgeon: Rogene Houston, MD;  [facility];  Service: Endoscopy;;  cold snare distal sigmoid   TRANSTHORACIC ECHOCARDIOGRAM  04/3975   LV systolic function is low-normal; RV is normal in size & function; mild MR, mild TR   TUBAL LIGATION      Current Medications: Current Meds  Medication Sig   ARIPiprazole (ABILIFY) 5 MG tablet Take 1 tablet (5 mg total) by mouth daily.   aspirin EC 81 MG tablet Take 81 mg by mouth daily.   cetirizine (ZYRTEC) 10 MG tablet cetirizine 10 mg tablet   10 mg by oral route.   DULoxetine (CYMBALTA) 30 MG capsule Total of 90 mg daily (take along with 60 mg)   DULoxetine (CYMBALTA) 60 MG capsule Take 1 capsule (60 mg total) by mouth daily.   fluticasone (FLONASE) 50 MCG/ACT nasal spray Place 2 sprays into both nostrils daily as needed for allergies.    loratadine (CLARITIN) 10 MG tablet Take 10 mg by mouth daily.    LORazepam (ATIVAN) 1 MG tablet Take 1 tablet (1 mg total) by mouth 2 (two) times daily as needed for anxiety.   naloxone (NARCAN) 4 MG/0.1ML LIQD nasal spray kit    ondansetron (ZOFRAN) 8 MG tablet TAKE 1 TABLET BY MOUTH EVERY 8 HOURS AS NEEDED FOR NAUSEA AND VOMITING.   oxyCODONE-acetaminophen (PERCOCET) 10-325 MG tablet Take 1 tablet by mouth every 6 (six) hours as needed for pain.    pantoprazole (PROTONIX) 40 MG tablet Take 1 tablet (40 mg total) by mouth 2 (two) times daily before a meal.   pregabalin (LYRICA) 75 MG capsule Take 75 mg by mouth 2 (two) times daily.   PROAIR HFA 108 (90 Base) MCG/ACT inhaler USE 2 PUFFS EVERY 6 HOURS AS NEEDED FOR WHEEZING OR SHORTNESS OF BREATH. (Patient taking differently: Inhale 2 puffs into the lungs every 6 (six) hours as needed for wheezing or shortness of breath.)   promethazine (PHENERGAN) 25 MG tablet Take 25 mg by mouth every 6 (six) hours as needed for nausea or vomiting.    tamsulosin (FLOMAX) 0.4 MG CAPS capsule Take 1 capsule (0.4 mg total) by mouth daily  after supper.   Tetrahydrozoline HCl (VISINE OP) Place 1 drop into both eyes daily as needed (dry / allergy eyes).   traZODone (DESYREL) 150 MG tablet Take 1 tablet (150 mg total) by mouth at bedtime.     Allergies:   Dicyclomine, Other, Flagyl [metronidazole hcl], Morphine, Morphine and related, and Penicillins   Social History   Socioeconomic History   Marital status: Single    Spouse name: Not on file   Number of children: 4   Years of education: 9   Highest education level: Not on file  Occupational History    Employer: [employer]  Tobacco Use   Smoking status: Every Day    Packs/day: 0.50  Years: 28.00    Pack years: 14.00    Types: Cigarettes    Start date: 10/11/1986   Smokeless tobacco: Never  Vaping Use   Vaping Use: Former   Substances: Nicotine  Substance and Sexual Activity   Alcohol use: Not Currently    Alcohol/week: 1.0 standard drink    Types: 1 Glasses of wine per week    Comment: occasionally    Drug use: Not Currently    Types: Marijuana    Comment: twice a week   Sexual activity: Yes    Birth control/protection: Surgical    Comment: tubal  Other Topics Concern   Not on file  Social History Narrative   Not on file   Social Determinants of Health   Financial Resource Strain: Medium Risk   Difficulty of Paying Living Expenses: Somewhat hard  Food Insecurity: Food Insecurity Present   Worried About Running Out of Food in the Last Year: Sometimes true   Ran Out of Food in the Last Year: Sometimes true  Transportation Needs: No Transportation Needs   Lack of Transportation (Medical): No   Lack of Transportation (Non-Medical): No  Physical Activity: Insufficiently Active   Days of Exercise per Week: 2 days   Minutes of Exercise per Session: 20 min  Stress: Stress Concern Present   Feeling of Stress : To some extent  Social Connections: Unknown   Frequency of Communication with Friends and Family: More than three times a week   Frequency of  Social Gatherings with Friends and Family: Twice a week   Attends Religious Services: Never   Marine scientist or Organizations: No   Attends Music therapist: Not on file   Marital Status: Patient refused     Family History: The patient's family history includes Dementia in her father; Leukemia in her maternal grandfather; Other in her brother, maternal grandmother, and mother.  ROS:   Please see the history of present illness.    Denies any fevers chills nausea vomiting syncope bleeding all other systems reviewed and are negative.  EKGs/Labs/Other Studies Reviewed:    The following studies were reviewed today:  ECHO 2016:  - Left ventricle: The cavity size was normal. Systolic function was    normal. The estimated ejection fraction was in the range of 55%    to 60%. Wall motion was normal; there were no regional wall    motion abnormalities. Left ventricular diastolic function    parameters were normal.  - Aortic valve: Mildly to moderately calcified annulus.   EKG:  EKG is  ordered today.  The ekg ordered today demonstrates sinus rhythm 67 with no other changes  Recent Labs: No results found for requested labs within last 8760 hours.  Recent Lipid Panel    Component Value Date/Time   CHOL 189 08/08/2017 1636   TRIG 244 (H) 08/08/2017 1636   HDL 40 08/08/2017 1636   CHOLHDL 4.7 (H) 08/08/2017 1636   LDLCALC 100 (H) 08/08/2017 1636     Risk Assessment/Calculations:          Physical Exam:    VS:  BP 132/78   Pulse 69   Ht 5' (1.524 m)   Wt 131 lb 6.4 oz (59.6 kg)   LMP  (LMP Unknown)   SpO2 96%   BMI 25.66 kg/m     Wt Readings from Last 3 Encounters:  09/29/21 131 lb 6.4 oz (59.6 kg)  09/29/21 131 lb 2 oz (59.5 kg)  04/06/21 139 lb (  63 kg)     GEN:  Well nourished, well developed in no acute distress HEENT: Normal NECK: No JVD; No carotid bruits LYMPHATICS: No lymphadenopathy CARDIAC: RRR, no murmurs, rubs, gallops RESPIRATORY:   Clear to auscultation without rales, wheezing or rhonchi  ABDOMEN: Soft, non-tender, non-distended MUSCULOSKELETAL:  No edema; No deformity  SKIN: Warm and dry NEUROLOGIC:  Alert and oriented x 3 PSYCHIATRIC:  Normal affect   ASSESSMENT:    1. Palpitations   2. Aortic valve disease   3. Tobacco abuse   4. Aortic valve disorder    PLAN:    In order of problems listed above:  Palpitations We will go ahead and check a Zio patch monitor for 2 weeks to ensure that she does not have any adverse arrhythmias.  Tobacco abuse Discussed tobacco cessation with her.  Encouraged.  Aortic valve disorder Aortic valve atherosclerosis noted previously on echocardiogram.  We will recheck.   We will follow-up with results of studies.    Medication Adjustments/Labs and Tests Ordered: Current medicines are reviewed at length with the patient today.  Concerns regarding medicines are outlined above.  Orders Placed This Encounter  Procedures   LONG TERM MONITOR (3-14 DAYS)   LONG TERM MONITOR (3-14 DAYS)   EKG 12-Lead   ECHOCARDIOGRAM COMPLETE   No orders of the defined types were placed in this encounter.   Patient Instructions  Medication Instructions:   Your physician recommends that you continue on your current medications as directed. Please refer to the Current Medication list given to you today.   *If you need a refill on your cardiac medications before your next appointment, please call your pharmacy*   Lab Work: [place]   If you have labs (blood work) drawn today and your tests are completely normal, you will receive your results only by: [place] (if you have MyChart) OR A paper copy in the mail If you have any lab test that is abnormal or we need to change your treatment, we will call you to review the results.   Testing/Procedures: Your physician has requested that you have an echocardiogram. Echocardiography is a painless test that uses sound waves  to create images of your heart. It provides your doctor with information about the size and shape of your heart and how well your heart's chambers and valves are working. This procedure takes approximately one hour. There are no restrictions for this procedure.   Your physician has recommended that you wear an event monitor. Event monitors are medical devices that record the heart's electrical activity. Doctors most often [place] these monitors to diagnose arrhythmias. Arrhythmias are problems with the speed or rhythm of the heartbeat. The monitor is a small, portable device. You can wear one while you do your normal daily activities. This is usually used to diagnose what is causing palpitations/syncope (passing out).   Follow-Up: At [facility], you and your health needs are our priority.  As part of our continuing mission to provide you with exceptional heart care, we have created designated Provider Care Teams.  These Care Teams include your primary Cardiologist (physician) and Advanced Practice Providers (APPs -  Physician Assistants and Nurse Practitioners) who all work together to provide you with the care you need, when you need it.  We recommend signing up for the patient portal called "MyChart".  Sign up information is provided on this After Visit Summary.  MyChart is used to connect with patients for Virtual Visits (Telemedicine).  Patients  are able to view lab/test results, encounter notes, upcoming appointments, etc.  Non-urgent messages can be sent to your provider as well.   To learn more about what you can do with MyChart, go to [email or web address].    Your next appointment:  [facility] [phone] AS NEEDED FOR  ANY CARDIAC RELATED SYMPTOMS      Signed, Candee Furbish, MD  09/29/2021 5:17 PM    [place]

## 2021-09-30 ENCOUNTER — Telehealth (INDEPENDENT_AMBULATORY_CARE_PROVIDER_SITE_OTHER): Payer: Medicaid Other | Admitting: Psychiatry

## 2021-09-30 ENCOUNTER — Encounter: Payer: Self-pay | Admitting: Psychiatry

## 2021-09-30 DIAGNOSIS — F330 Major depressive disorder, recurrent, mild: Secondary | ICD-10-CM | POA: Diagnosis not present

## 2021-09-30 DIAGNOSIS — F4310 Post-traumatic stress disorder, unspecified: Secondary | ICD-10-CM | POA: Diagnosis not present

## 2021-09-30 DIAGNOSIS — G4700 Insomnia, unspecified: Secondary | ICD-10-CM | POA: Diagnosis not present

## 2021-09-30 NOTE — Patient Instructions (Signed)
Continue duloxetine 120 mg daily Continue Abilify 5 mg daily Continue lorazepam 1 mg twice a day as needed for anxiety  Continue trazodone 100 mg at night as needed for sleep Next appointment: 1/26 ar 3 PM

## 2021-10-05 ENCOUNTER — Telehealth: Payer: Self-pay | Admitting: Cardiology

## 2021-10-05 NOTE — Telephone Encounter (Signed)
pt states that she is not able to get heart monitor to stick and would like to know what she should do.. please advise.

## 2021-10-05 NOTE — Telephone Encounter (Signed)
Spoke with pt who will call company and notify them that the monitor has fallen off. Informed pt that she could also use medical tape to secure the monitor in place.

## 2021-10-06 ENCOUNTER — Telehealth: Payer: Self-pay

## 2021-10-06 DIAGNOSIS — R32 Unspecified urinary incontinence: Secondary | ICD-10-CM

## 2021-10-06 NOTE — Telephone Encounter (Signed)
Patient called office today with complaints of new onset dizziness.  Started flomax 10/26 Dizziness started today.  Patient also dealing "sinus infection" instructed patient to hold flomax for 2 days to see if dizziness improves, if symptoms persist instructed patient to call PCP due to sinus infection reports she is dealing with.  Patient voiced understanding.

## 2021-10-07 MED ORDER — ALFUZOSIN HCL ER 10 MG PO TB24: 10.0000 mg | ORAL_TABLET | Freq: Every day | ORAL | 5 refills | Status: DC

## 2021-10-07 NOTE — Telephone Encounter (Signed)
Returned call to patient today to check on symptoms. Dizziness is gone  per patient after stopping Flomax.  New order for  uroxatral sent to Pharmacy per Dr. Alyson Ingles.  Patient voiced understanding to call office is symptoms return on new medication.

## 2021-10-19 ENCOUNTER — Ambulatory Visit: Payer: Medicaid Other

## 2021-10-25 ENCOUNTER — Encounter: Payer: Self-pay | Admitting: Cardiology

## 2021-10-26 ENCOUNTER — Ambulatory Visit (INDEPENDENT_AMBULATORY_CARE_PROVIDER_SITE_OTHER): Payer: Medicaid Other

## 2021-10-26 ENCOUNTER — Other Ambulatory Visit: Payer: Self-pay

## 2021-10-26 ENCOUNTER — Other Ambulatory Visit: Payer: Self-pay | Admitting: Cardiology

## 2021-10-26 DIAGNOSIS — R002 Palpitations: Secondary | ICD-10-CM

## 2021-10-30 DIAGNOSIS — R002 Palpitations: Secondary | ICD-10-CM

## 2021-11-03 ENCOUNTER — Other Ambulatory Visit: Payer: Self-pay | Admitting: Psychiatry

## 2021-11-03 DIAGNOSIS — F4310 Post-traumatic stress disorder, unspecified: Secondary | ICD-10-CM

## 2021-11-03 DIAGNOSIS — F331 Major depressive disorder, recurrent, moderate: Secondary | ICD-10-CM

## 2021-11-08 ENCOUNTER — Other Ambulatory Visit: Payer: Self-pay | Admitting: Psychiatry

## 2021-11-10 ENCOUNTER — Other Ambulatory Visit: Payer: Self-pay

## 2021-11-10 ENCOUNTER — Ambulatory Visit (HOSPITAL_COMMUNITY)
Admission: RE | Admit: 2021-11-10 | Discharge: 2021-11-10 | Disposition: A | Discharge status: Home / Self Care | Payer: Medicaid Other | Source: Ambulatory Visit | Attending: Internal Medicine | Admitting: Internal Medicine

## 2021-11-10 DIAGNOSIS — I359 Nonrheumatic aortic valve disorder, unspecified: Secondary | ICD-10-CM

## 2021-11-10 LAB — ECHOCARDIOGRAM COMPLETE
AR max vel: 1.79 cm2
AV Area VTI: 1.84 cm2
AV Area mean vel: 1.62 cm2
AV Mean grad: 3.3 mmHg
AV Peak grad: 5.4 mmHg
Ao pk vel: 1.16 m/s
Area-P 1/2: 2.91 cm2
MV M vel: 4.82 m/s
MV Peak grad: 92.9 mmHg
S' Lateral: 2.7 cm

## 2021-11-10 NOTE — Progress Notes (Signed)
*  PRELIMINARY RESULTS* Echocardiogram 2D Echocardiogram has been performed.  Julie [name] 11/10/2021, 2:50 PM

## 2021-11-12 ENCOUNTER — Telehealth: Payer: Self-pay | Admitting: Cardiology

## 2021-11-12 NOTE — Telephone Encounter (Signed)
Returned call to Pt.  Echo results given.  No questions.

## 2021-11-12 NOTE — Telephone Encounter (Signed)
Patient is returning call to discuss echo results. °

## 2021-11-17 ENCOUNTER — Ambulatory Visit: Payer: Medicaid Other | Admitting: Urology

## 2021-11-17 ENCOUNTER — Other Ambulatory Visit (HOSPITAL_COMMUNITY): Payer: Medicaid Other

## 2021-11-19 ENCOUNTER — Encounter: Payer: Self-pay | Admitting: *Deleted

## 2021-11-24 ENCOUNTER — Ambulatory Visit: Payer: Medicaid Other | Admitting: Urology

## 2021-12-03 ENCOUNTER — Other Ambulatory Visit: Payer: Self-pay | Admitting: Psychiatry

## 2021-12-03 DIAGNOSIS — F331 Major depressive disorder, recurrent, moderate: Secondary | ICD-10-CM

## 2021-12-03 DIAGNOSIS — F4310 Post-traumatic stress disorder, unspecified: Secondary | ICD-10-CM

## 2021-12-05 ENCOUNTER — Encounter: Payer: Self-pay | Admitting: Psychiatry

## 2021-12-05 NOTE — Telephone Encounter (Signed)
This encounter was created in error - please disregard.

## 2021-12-08 ENCOUNTER — Ambulatory Visit: Payer: Medicaid Other | Admitting: Urology
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
## 2021-12-30 ENCOUNTER — Other Ambulatory Visit: Payer: Self-pay

## 2021-12-30 ENCOUNTER — Encounter: Payer: Self-pay | Admitting: Psychiatry

## 2021-12-30 ENCOUNTER — Telehealth (INDEPENDENT_AMBULATORY_CARE_PROVIDER_SITE_OTHER): Payer: Medicaid Other | Admitting: Psychiatry

## 2021-12-30 DIAGNOSIS — F4310 Post-traumatic stress disorder, unspecified: Secondary | ICD-10-CM

## 2021-12-30 DIAGNOSIS — F331 Major depressive disorder, recurrent, moderate: Secondary | ICD-10-CM | POA: Diagnosis not present

## 2021-12-30 MED ORDER — DULOXETINE HCL 60 MG PO CPEP: 60.0000 mg | ORAL_CAPSULE | Freq: Every day | ORAL | 1 refills | Status: DC

## 2021-12-30 MED ORDER — LORAZEPAM 1 MG PO TABS: 1.0000 mg | ORAL_TABLET | Freq: Two times a day (BID) | ORAL | 0 refills | Status: DC | PRN

## 2021-12-30 MED ORDER — ARIPIPRAZOLE 10 MG PO TABS: 10.0000 mg | ORAL_TABLET | Freq: Every day | ORAL | 1 refills | Status: DC

## 2021-12-30 MED ORDER — TRAZODONE HCL 150 MG PO TABS: 150.0000 mg | ORAL_TABLET | Freq: Every day | ORAL | 1 refills | Status: DC

## 2021-12-30 NOTE — Patient Instructions (Signed)
Continue duloxetine 120 mg daily Increase Abilify 10 mg daily Continue lorazepam 1 mg twice a day as needed for anxiety  Continue trazodone 100 mg at night as needed for sleep Next appointment: 3/2 at 11 AM  The next visit will be in person visit. Please arrive 15 mins before the scheduled time.   [facility]  Address: [address], [place], [place] [address]

## 2022-01-03 ENCOUNTER — Other Ambulatory Visit: Payer: Self-pay | Admitting: Psychiatry

## 2022-01-17 ENCOUNTER — Ambulatory Visit (HOSPITAL_COMMUNITY): Payer: Medicaid Other | Admitting: Clinical

## 2022-01-17 ENCOUNTER — Other Ambulatory Visit: Payer: Self-pay
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
## 2022-02-03 ENCOUNTER — Ambulatory Visit: Payer: Medicaid Other | Admitting: Psychiatry

## 2022-02-07 ENCOUNTER — Ambulatory Visit (HOSPITAL_COMMUNITY): Payer: Medicaid Other | Admitting: Clinical

## 2022-02-08 ENCOUNTER — Ambulatory Visit (HOSPITAL_COMMUNITY): Payer: Medicaid Other | Admitting: Psychiatry

## 2022-02-10 ENCOUNTER — Other Ambulatory Visit: Payer: Self-pay | Admitting: Psychiatry

## 2022-02-10 ENCOUNTER — Ambulatory Visit: Payer: Medicaid Other | Admitting: Psychiatry

## 2022-02-10 DIAGNOSIS — F331 Major depressive disorder, recurrent, moderate: Secondary | ICD-10-CM

## 2022-02-10 DIAGNOSIS — F4310 Post-traumatic stress disorder, unspecified: Secondary | ICD-10-CM

## 2022-02-23 ENCOUNTER — Telehealth: Payer: Self-pay | Admitting: Adult Health

## 2022-02-23 MED ORDER — FLUCONAZOLE 150 MG PO TABS: ORAL_TABLET | ORAL | 1 refills | Status: DC

## 2022-02-23 NOTE — Addendum Note (Signed)
Addended by: Derrek Monaco A on: 02/23/2022 04:01 PM ? ? Modules accepted: Orders ? ?

## 2022-02-23 NOTE — Telephone Encounter (Signed)
Patient called stating that she is having an issue an she would like to speak with a nurse or Jennifer's nurse. Patient did not want to state the issue she is having. Please contact pt ?

## 2022-02-23 NOTE — Telephone Encounter (Signed)
Called patient back regarding her concerns. Her primary has put her on clindamycin and she is having a yeast infection now. Would like Diflucan sent in to her pharmacy. Has appointment to f/u here on 4/6. Pt aware to check with pharmacy later today.  ?

## 2022-02-23 NOTE — Telephone Encounter (Signed)
Will rx diflucan  

## 2022-02-24 ENCOUNTER — Ambulatory Visit: Payer: Medicaid Other | Admitting: Psychiatry

## 2022-02-28 ENCOUNTER — Other Ambulatory Visit: Payer: Self-pay

## 2022-02-28 ENCOUNTER — Ambulatory Visit (HOSPITAL_COMMUNITY): Payer: Medicaid Other | Admitting: Psychiatry

## 2022-03-09 ENCOUNTER — Telehealth: Payer: Medicaid Other | Admitting: Psychiatry

## 2022-03-10 ENCOUNTER — Ambulatory Visit: Payer: Medicaid Other | Admitting: Adult Health

## 2022-03-14 ENCOUNTER — Other Ambulatory Visit: Payer: Self-pay | Admitting: Psychiatry

## 2022-03-15 ENCOUNTER — Other Ambulatory Visit: Payer: Self-pay | Admitting: Psychiatry

## 2022-03-15 MED ORDER — DULOXETINE HCL 30 MG PO CPEP: 30.0000 mg | ORAL_CAPSULE | Freq: Every day | ORAL | 1 refills | Status: DC

## 2022-03-16 ENCOUNTER — Telehealth: Payer: Medicaid Other | Admitting: Psychiatry

## 2022-03-17 ENCOUNTER — Encounter: Payer: Medicaid Other | Admitting: Adult Health

## 2022-03-18 NOTE — Progress Notes (Signed)
This encounter was created in error - please disregard.

## 2022-03-24 ENCOUNTER — Ambulatory Visit (HOSPITAL_COMMUNITY): Payer: Medicaid Other | Admitting: Psychiatry
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
## 2022-04-19 ENCOUNTER — Telehealth (INDEPENDENT_AMBULATORY_CARE_PROVIDER_SITE_OTHER): Payer: Medicaid Other | Admitting: Psychiatry

## 2022-04-19 ENCOUNTER — Encounter: Payer: Self-pay | Admitting: Psychiatry

## 2022-04-19 DIAGNOSIS — F4310 Post-traumatic stress disorder, unspecified: Secondary | ICD-10-CM | POA: Diagnosis not present

## 2022-04-19 DIAGNOSIS — F331 Major depressive disorder, recurrent, moderate: Secondary | ICD-10-CM

## 2022-04-19 DIAGNOSIS — G4700 Insomnia, unspecified: Secondary | ICD-10-CM

## 2022-04-19 MED ORDER — LORAZEPAM 1 MG PO TABS: 1.0000 mg | ORAL_TABLET | Freq: Two times a day (BID) | ORAL | 1 refills | Status: AC | PRN

## 2022-04-19 MED ORDER — ARIPIPRAZOLE 10 MG PO TABS: 10.0000 mg | ORAL_TABLET | Freq: Every day | ORAL | 0 refills | Status: DC

## 2022-04-19 MED ORDER — ZOLPIDEM TARTRATE 5 MG PO TABS: 5.0000 mg | ORAL_TABLET | Freq: Every evening | ORAL | 1 refills | Status: DC | PRN

## 2022-04-19 NOTE — Patient Instructions (Signed)
Continue duloxetine 90 mg daily ?Continue Abilify 10 mg daily ?Continue lorazepam 1 mg twice a day as needed for anxiety  ?Start Ambien 5 mg at night  ?Discontinue Trazodone ?Next appointment: 7/11 at 1:30, in person ? ?The next visit will be in person visit. Please arrive 15 mins before the scheduled time.  ? ?[facility]  ?Address: [address], [place], [place] [address]   ?

## 2022-04-20 ENCOUNTER — Encounter: Payer: Self-pay | Admitting: Adult Health

## 2022-04-20 ENCOUNTER — Ambulatory Visit (INDEPENDENT_AMBULATORY_CARE_PROVIDER_SITE_OTHER): Payer: Medicaid Other | Admitting: Adult Health

## 2022-04-20 VITALS — BP 106/73 | HR 86 | Ht 60.0 in | Wt 130.0 lb

## 2022-04-20 DIAGNOSIS — Z1231 Encounter for screening mammogram for malignant neoplasm of breast: Secondary | ICD-10-CM

## 2022-04-20 DIAGNOSIS — Z0000 Encounter for general adult medical examination without abnormal findings: Secondary | ICD-10-CM | POA: Diagnosis not present

## 2022-04-20 DIAGNOSIS — Z1211 Encounter for screening for malignant neoplasm of colon: Secondary | ICD-10-CM

## 2022-04-20 DIAGNOSIS — Z01419 Encounter for gynecological examination (general) (routine) without abnormal findings: Secondary | ICD-10-CM

## 2022-04-20 LAB — HEMOCCULT GUIAC POC 1CARD (OFFICE): Fecal Occult Blood, POC: NEGATIVE

## 2022-04-20 NOTE — Progress Notes (Signed)
Patient ID: Julie [name], female   DOB: 05-12-1971, 51 y.o.   MRN: [id number] ?History of Present Illness: ?Julie [name] is a 51 year old white female,single, PM in for a well woman gyn exam. ?She is working at [employer] in [place].  ? ?Lab Results  ?Component Value Date  ? DIAGPAP  01/06/2020  ?  - Negative for intraepithelial lesion or malignancy (NILM)  ? Ruso Negative 01/06/2020  ?  ?PCP is Dr Julie [name]. ? ?Current Medications, Allergies, Past Medical History, Past Surgical History, Family History and Social History were reviewed in [employer] record.   ? ? ?Review of Systems: ? ?Patient denies any headaches, hearing loss, fatigue, blurred vision, shortness of breath, chest pain, abdominal pain, problems with bowel movements, urination, or intercourse.(Not active). No joint pain or mood swings.  ?Denis any vaginal bleeding ? ?Physical Exam:BP 106/73 (BP Location: Left Arm, Patient Position: Sitting, Cuff Size: Normal)   Pulse 86   Ht 5' (1.524 m)   Wt 130 lb (59 kg)   LMP  (LMP Unknown)   BMI 25.39 kg/m?   ?General:  Well developed, well nourished, no acute distress ?Skin:  Warm and dry,dog scratches left arm ?Neck:  Midline trachea, normal thyroid, good ROM, no lymphadenopathy ?Lungs; Clear to auscultation bilaterally ?Breast:  No dominant palpable mass, retraction, or nipple discharge,left breast tender over implant has wrinkles, ?encapsulated. ?Cardiovascular: Regular rate and rhythm ?Abdomen:  Soft, non tender, no hepatosplenomegaly ?Pelvic:  External genitalia is normal in appearance, no lesions.  The vagina is pale with loss of rugae.Urethra has no lesions or masses. The cervix is smooth.  Uterus is felt to be normal size, shape, and contour.  No adnexal masses or tenderness noted.Bladder is non tender, no masses felt. ?Rectal: Good sphincter tone, no polyps, or hemorrhoids felt.  Hemoccult negative. ?Extremities/musculoskeletal:  No swelling or varicosities noted, no clubbing or  cyanosis ?Psych:  No mood changes, alert and cooperative,seems happy ?AA is 2 ?Fall risk is low ? ?  04/20/2022  ?  9:19 AM 08/19/2021  ?  4:42 PM 04/07/2021  ?  2:09 PM  ?Depression screen PHQ 2/9  ?Decreased Interest 2    ?Down, Depressed, Hopeless 2    ?PHQ - 2 Score 4    ?Altered sleeping 3    ?Tired, decreased energy 3    ?Change in appetite 2    ?Feeling bad or failure about yourself  3    ?Trouble concentrating 3    ?Moving slowly or fidgety/restless 2    ?Suicidal thoughts 0    ?PHQ-9 Score 20    ?Difficult doing work/chores     ?  ? Information is confidential and restricted. Go to Review Flowsheets to unlock data.  ?  She is on meds and sees [facility] in [place] ? ? ?  04/20/2022  ?  9:20 AM 04/06/2021  ?  1:56 PM  ?GAD 7 : Generalized Anxiety Score  ?Nervous, Anxious, on Edge 3 1  ?Control/stop worrying 3 2  ?Worry too much - different things 3 2  ?Trouble relaxing 3 1  ?Restless 3 0  ?Easily annoyed or irritable 3 2  ?Afraid - awful might happen 2 1  ?Total GAD 7 Score 20 9  ? ?  ? Upstream - 04/20/22 0918   ? ?  ? Pregnancy Intention Screening  ? Does the patient want to become pregnant in the next year? N/A   ? Does the patient's partner want to become pregnant  in the next year? N/A   ? Would the patient like to discuss contraceptive options today? N/A   ?  ? Contraception Wrap Up  ? Current Method Female Sterilization   ? End Method Female Sterilization   ? Contraception Counseling Provided No   ? ?  ?  ? ?  ?  ?Examination chaperoned by Marcelino Scot RN ? ?Impression and Plan: ?1. Encounter for well woman exam with routine gynecological exam ?Pap and physical in 1 year ?Labs with PCP ?Colonoscopy per GI ? ?2. Encounter for screening fecal occult blood testing ?Hemoccult is negative  ? ?3. Screening mammogram for breast cancer ?Pt to call and schedule at [facility] ? ? ? ? ?  ?  ?

## 2022-06-02 ENCOUNTER — Ambulatory Visit (HOSPITAL_COMMUNITY): Payer: Medicaid Other

## 2022-06-12 NOTE — Progress Notes (Deleted)
BH MD/PA/NP OP Progress Note  06/12/2022 9:28 AM Julie [name]  MRN:  [id number]  Chief Complaint: No chief complaint on file.  HPI: *** Visit Diagnosis: No diagnosis found.  Past Psychiatric History: Please see initial evaluation for full details. I have reviewed the history. No updates at this time.     Past Medical History:  Past Medical History:  Diagnosis Date   Anger    Anxiety    Aortic insufficiency 2006   Noted at heart cath - mild to moderate   Arthritis    Asthma    Back pain    BV (bacterial vaginosis) 05/28/2015   Chronic diarrhea    Chronic headache    Depression    Diarrhea 05/28/2015   Dyslipidemia 01/19/2016   Fibroids, submucosal 10/24/2018   GERD (gastroesophageal reflux disease)    History of kidney stones    history of   History of nuclear stress test 07/15/2008   lexiscan; mild-mod perfusion defect in mid anteroseptal, apical anterior, apical septal regions (attenuation artifiact), prominent gut uptake activity in infero-apical region; post-stress EF 64%; EKG negative for ischemia; patient experienced CP during test   Hx of cardiac catheterization 2006   no significant CAD   IBS (irritable bowel syndrome)    Irritable bowel syndrome    Nausea 05/28/2015   Neck pain    Numerous moles 01/18/2016   RLQ abdominal pain 05/28/2015   Seizures ([place])    2 years ago; unknown etiology and none since then and on no meds   Vaginal discharge 05/28/2015   Weight gain 01/18/2016    Past Surgical History:  Procedure Laterality Date   BIOPSY N/A 02/25/2014   Procedure: BIOPSY;  Surgeon: Rogene Houston, MD;  [facility];  Service: Endoscopy;  Laterality: N/A;   BREAST ENHANCEMENT SURGERY     CARDIAC CATHETERIZATION  1025//2006   no significant CAD, mild-mod depressed LV systolic function, EF 00-92%, mild-mod AI (Dr. Gerrie Nordmann)    CARPAL TUNNEL RELEASE Bilateral 01/01/2016   CESAREAN SECTION     COLONOSCOPY  05/27/2011   snare polypectomy     COLONOSCOPY WITH PROPOFOL N/A 10/21/2016   Procedure: COLONOSCOPY WITH PROPOFOL;  Surgeon: Rogene Houston, MD;  [facility];  Service: Endoscopy;  Laterality: N/A;  10:30   COLONOSCOPY WITH PROPOFOL N/A 08/02/2019   Procedure: COLONOSCOPY WITH PROPOFOL;  Surgeon: Rogene Houston, MD;  [facility];  Service: Endoscopy;  Laterality: N/A;  7:30   ESOPHAGOGASTRODUODENOSCOPY N/A 02/25/2014   Procedure: ESOPHAGOGASTRODUODENOSCOPY (EGD);  Surgeon: Rogene Houston, MD;  [facility];  Service: Endoscopy;  Laterality: N/A;  730   ESOPHAGOGASTRODUODENOSCOPY (EGD) WITH PROPOFOL N/A 08/02/2019   Procedure: ESOPHAGOGASTRODUODENOSCOPY (EGD) WITH PROPOFOL;  Surgeon: Rogene Houston, MD;  [facility];  Service: Endoscopy;  Laterality: N/A;   KNEE SURGERY Right    MASS EXCISION Right 10/21/2020   Procedure: EXCISION CYST 2 CM, RIGHT AXILLA  AND EXCISION OF RIGHT FLANK SKIN LESION;  Surgeon: Virl Cagey, MD;  Location: [facility];  Service: General;  Laterality: Right;   PARTIAL KNEE ARTHROPLASTY Right 03/07/2018   Procedure: Right knee medial unicompartmental arthroplasty;  Surgeon: Gaynelle Arabian, MD;  Location: [facility];  Service: Orthopedics;  Laterality: Right;  with block   POLYPECTOMY  08/02/2019   Procedure: POLYPECTOMY;  Surgeon: Rogene Houston, MD;  [facility];  Service: Endoscopy;;  cold snare distal sigmoid   TRANSTHORACIC ECHOCARDIOGRAM  07/2008  LV systolic function is low-normal; RV is normal in size & function; mild MR, mild TR   TUBAL LIGATION      Family Psychiatric History: Please see initial evaluation for full details. I have reviewed the history. No updates at this time.     Family History:  Family History  Problem Relation Age of Onset   Other Mother        heart problems   Other Brother        neck and back surgery   Other Maternal Grandmother        brain tumor   Leukemia Maternal Grandfather    Dementia Father      Social History:  Social History   Socioeconomic History   Marital status: Single    Spouse name: Not on file   Number of children: 4   Years of education: 9   Highest education level: Not on file  Occupational History    Employer: [employer]  Tobacco Use   Smoking status: Every Day    Packs/day: 0.50    Years: 28.00    Total pack years: 14.00    Types: Cigarettes    Start date: 10/11/1986   Smokeless tobacco: Never  Vaping Use   Vaping Use: Former   Substances: Nicotine  Substance and Sexual Activity   Alcohol use: Not Currently    Alcohol/week: 1.0 standard drink of alcohol    Types: 1 Glasses of wine per week    Comment: occasionally    Drug use: Not Currently    Types: Marijuana    Comment: twice a week   Sexual activity: Not Currently    Birth control/protection: Surgical    Comment: tubal  Other Topics Concern   Not on file  Social History Narrative   Not on file   Social Determinants of Health   Financial Resource Strain: Medium Risk (04/20/2022)   Overall Financial Resource Strain (CARDIA)    Difficulty of Paying Living Expenses: Somewhat hard  Food Insecurity: Food Insecurity Present (04/20/2022)   Hunger Vital Sign    Worried About Running Out of Food in the Last Year: Sometimes true    Ran Out of Food in the Last Year: Sometimes true  Transportation Needs: Unmet Transportation Needs (04/20/2022)   PRAPARE - Transportation    Lack of Transportation (Medical): Yes    Lack of Transportation (Non-Medical): Yes  Physical Activity: Inactive (04/20/2022)   Exercise Vital Sign    Days of Exercise per Week: 0 days    Minutes of Exercise per Session: 0 min  Stress: Stress Concern Present (04/20/2022)   [employer] [place]    Feeling of Stress : Rather much  Social Connections: Socially Isolated (04/20/2022)   Social Connection and Isolation Panel [NHANES]    Frequency of Communication with Friends and  Family: Twice a week    Frequency of Social Gatherings with Friends and Family: Never    Attends Religious Services: More than 4 times per year    Active Member of [employer] or Organizations: No    Attends Archivist Meetings: Never    Marital Status: Divorced    Allergies:  Allergies  Allergen Reactions   Dicyclomine Palpitations    Patient states that her heart rate will go real fast when she takes this medication.   Other Other (See Comments)    House dust   Flagyl [Metronidazole Hcl] Nausea And Vomiting   Morphine Itching   Morphine  And Related Other (See Comments)    Headache    Penicillins Other (See Comments)    Caused patient to pass out Did it involve swelling of the face/tongue/throat, SOB, or low BP? No Did it involve sudden or severe rash/hives, skin peeling, or any reaction on the inside of your mouth or nose? No Did you need to seek medical attention at a hospital or doctor's office? No When did it last happen?      20 + years If all above answers are "NO", may proceed with cephalosporin use.     Metabolic Disorder Labs: Lab Results  Component Value Date   HGBA1C 5.4 08/08/2017   No results found for: "PROLACTIN" Lab Results  Component Value Date   CHOL 189 08/08/2017   TRIG 244 (H) 08/08/2017   HDL 40 08/08/2017   CHOLHDL 4.7 (H) 08/08/2017   LDLCALC 100 (H) 08/08/2017   LDLCALC 72 01/18/2016   Lab Results  Component Value Date   TSH 0.34 (L) 07/29/2019   TSH 0.402 (L) 11/22/2017    Therapeutic Level Labs: No results found for: "LITHIUM" No results found for: "VALPROATE" No results found for: "CBMZ"  Current Medications: Current Outpatient Medications  Medication Sig Dispense Refill   alfuzosin (UROXATRAL) 10 MG 24 hr tablet Take 1 tablet (10 mg total) by mouth daily with breakfast. 30 tablet 5   ARIPiprazole (ABILIFY) 10 MG tablet Take 1 tablet (10 mg total) by mouth daily. 30 tablet 0   aspirin EC 81 MG tablet Take 81 mg by mouth  daily.     cetirizine (ZYRTEC) 10 MG tablet cetirizine 10 mg tablet   10 mg by oral route.     DULoxetine (CYMBALTA) 30 MG capsule Take 1 capsule (30 mg total) by mouth daily. Total of 90 mg daily (take along with 60 mg) 90 capsule 1   DULoxetine (CYMBALTA) 60 MG capsule Take 1 capsule (60 mg total) by mouth daily. 90 capsule 1   fluconazole (DIFLUCAN) 150 MG tablet Take 1 now and 1 in 3 days (Patient not taking: Reported on 04/20/2022) 2 tablet 1   fluticasone (FLONASE) 50 MCG/ACT nasal spray Place 2 sprays into both nostrils daily as needed for allergies.      loratadine (CLARITIN) 10 MG tablet Take 10 mg by mouth daily.      LORazepam (ATIVAN) 1 MG tablet Take 1 tablet (1 mg total) by mouth 2 (two) times daily as needed for anxiety. 60 tablet 1   naloxone (NARCAN) 4 MG/0.1ML LIQD nasal spray kit      Olopatadine HCl 0.2 % SOLN olopatadine 0.2 % eye drops     ondansetron (ZOFRAN) 8 MG tablet TAKE 1 TABLET BY MOUTH EVERY 8 HOURS AS NEEDED FOR NAUSEA AND VOMITING. 20 tablet 0   oxyCODONE-acetaminophen (PERCOCET) 10-325 MG tablet Take 1 tablet by mouth every 6 (six) hours as needed for pain.      pantoprazole (PROTONIX) 40 MG tablet Take 1 tablet (40 mg total) by mouth 2 (two) times daily before a meal. 60 tablet 0   predniSONE (DELTASONE) 10 MG tablet Take 10 mg by mouth 2 (two) times daily.     pregabalin (LYRICA) 150 MG capsule Take 150 mg by mouth 2 (two) times daily.     PROAIR HFA 108 (90 Base) MCG/ACT inhaler USE 2 PUFFS EVERY 6 HOURS AS NEEDED FOR WHEEZING OR SHORTNESS OF BREATH. (Patient taking differently: Inhale 2 puffs into the lungs every 6 (six) hours as needed for wheezing  or shortness of breath.) 8.5 g 0   promethazine (PHENERGAN) 25 MG tablet Take 25 mg by mouth every 6 (six) hours as needed for nausea or vomiting.      tamsulosin (FLOMAX) 0.4 MG CAPS capsule tamsulosin 0.4 mg capsule     Tetrahydrozoline HCl (VISINE OP) Place 1 drop into both eyes daily as needed (dry / allergy  eyes).     tiZANidine (ZANAFLEX) 4 MG tablet Take 4 mg by mouth 3 (three) times daily as needed.     topiramate (TOPAMAX) 25 MG tablet Take 25 mg by mouth daily.     VALTOCO 10 MG DOSE 10 MG/0.1ML LIQD SMARTSIG:1 Spray(s) Both Nares Twice Daily PRN     zolpidem (AMBIEN) 5 MG tablet Take 1 tablet (5 mg total) by mouth at bedtime as needed for sleep. 30 tablet 1   No current facility-administered medications for this visit.     Musculoskeletal: Strength & Muscle Tone: within normal limits Gait & Station: normal Patient leans: N/A  Psychiatric Specialty Exam: Review of Systems  There were no vitals taken for this visit.There is no height or weight on file to calculate BMI.  General Appearance: {Appearance:22683}  Eye Contact:  {BHH EYE CONTACT:22684}  Speech:  Clear and Coherent  Volume:  Normal  Mood:  {BHH MOOD:22306}  Affect:  {Affect (PAA):22687}  Thought Process:  Coherent  Orientation:  Full (Time, Place, and Person)  Thought Content: Logical   Suicidal Thoughts:  {ST/HT (PAA):22692}  Homicidal Thoughts:  {ST/HT (PAA):22692}  Memory:  Immediate;   Good  Judgement:  {Judgement (PAA):22694}  Insight:  {Insight (PAA):22695}  Psychomotor Activity:  Normal  Concentration:  Concentration: Good and Attention Span: Good  Recall:  Good  Fund of Knowledge: Good  Language: Good  Akathisia:  No  Handed:  Right  AIMS (if indicated): not done  Assets:  Communication Skills Desire for Improvement  ADL's:  Intact  Cognition: WNL  Sleep:  {BHH GOOD/FAIR/POOR:22877}   Screenings: GAD-7    Flowsheet Row Office Visit from 04/20/2022 in [place] Visit from 04/06/2021 in [place]  Total GAD-7 Score 20 9      PHQ2-9    Page Visit from 04/20/2022 in [place] Video Visit from 08/19/2021 in [place] Video Visit from 04/07/2021 in [place] Visit from  04/06/2021 in [place] Video Visit from 03/02/2021 in [place]  PHQ-2 Total Score _0 PHQ-9 Total Score _1 Flowsheet Row Video Visit from 06/03/2021 in [place] No Risk        Assessment and Plan:  Julie [name] is a 51 y.o. year old female with a history of PTSD, depression, anxiety, GERD, who presents for follow up appointment for below.     1. MDD (major depressive disorder), recurrent episode, moderate ([place]) 2. PTSD (post-traumatic stress disorder) There has been overall improvement in depressive symptoms since uptitration of Abilify, and this also coincided with breaking up with a man, who is married, and starting a work.  Other psychosocial stressors includes her niece being diagnosed with GSS, back pain, and her father with dementia.   Noted that she has not uptitrated duloxetine as instructed several months ago. Will continue current dose of duloxetine given she has been relatively doing well at the current dose.  Will continue Abilify as adjunctive treatment for depression.  Will continue lorazepam as needed for anxiety. Will obtain labs to rule out any medical condition contributing to her mood symptoms.  She was referred for CBT in the past.    3. Insomnia, unspecified type She reports drowsiness from trazodone.  Will switch to Ambien to target insomnia.  Discussed potential risk of drowsiness, sleepwalking. Noted that she continues to have insomnia, daytime fatigue and snoring.  She will contact the insurance company to find a provider in the area for evaluation of sleep apnea.    Plan Continue duloxetine 90 mg daily Continue Abilify 10 mg daily Continue lorazepam 1 mg twice a day as needed for anxiety  Start Ambien 5 mg at night  Discontinue Trazodone (was on 100 mg) Obtain labs (CBC, TSH) Next appointment: 7/11 at 1:30 for 30 mins, in person - on  pregabalin 75 mg BID   This clinician has discussed the side effect associated with medication prescribed during this encounter. Please refer to notes in the previous encounters for more details.     Past trials of medication: sertraline, fluoxetine, citalopram, lexapro, venlafaxine, bupropion,  quetiapine (increase in appetite), Trazodone (drowsiness),             Collaboration of Care: Collaboration of Care: {BH OP Collaboration of Care:21014065}  Patient/Guardian was advised Release of Information must be obtained prior to any record release in order to collaborate their care with an outside provider. Patient/Guardian was advised if they have not already done so to contact the registration department to sign all necessary forms in order for [place] to release information regarding their care.   Consent: Patient/Guardian gives verbal consent for treatment and assignment of benefits for services provided during this visit. Patient/Guardian expressed understanding and agreed to proceed.    Norman Clay, MD 06/12/2022, 9:28 AM

## 2022-06-14 ENCOUNTER — Ambulatory Visit: Payer: Medicaid Other | Admitting: Psychiatry

## 2022-06-23 ENCOUNTER — Ambulatory Visit (HOSPITAL_COMMUNITY): Payer: Medicaid Other

## 2022-07-06 NOTE — Progress Notes (Deleted)
[facility] MD/PA/NP OP Progress Note  07/06/2022 1:37 PM Hanley NEHEMIE CASSERLY  MRN:  [id number]  Chief Complaint: No chief complaint on file.  HPI: *** Visit Diagnosis: No diagnosis found.  Past Psychiatric History: Please see initial evaluation for full details. I have reviewed the history. No updates at this time.     Past Medical History:  Past Medical History:  Diagnosis Date   Anger    Anxiety    Aortic insufficiency 2006   Noted at heart cath - mild to moderate   Arthritis    Asthma    Back pain    BV (bacterial vaginosis) 05/28/2015   Chronic diarrhea    Chronic headache    Depression    Diarrhea 05/28/2015   Dyslipidemia 01/19/2016   Fibroids, submucosal 10/24/2018   GERD (gastroesophageal reflux disease)    History of kidney stones    history of   History of nuclear stress test 07/15/2008   lexiscan; mild-mod perfusion defect in mid anteroseptal, apical anterior, apical septal regions (attenuation artifiact), prominent gut uptake activity in infero-apical region; post-stress EF 64%; EKG negative for ischemia; patient experienced CP during test   Hx of cardiac catheterization 2006   no significant CAD   IBS (irritable bowel syndrome)    Irritable bowel syndrome    Nausea 05/28/2015   Neck pain    Numerous moles 01/18/2016   RLQ abdominal pain 05/28/2015   Seizures ([place])    2 years ago; unknown etiology and none since then and on no meds   Vaginal discharge 05/28/2015   Weight gain 01/18/2016    Past Surgical History:  Procedure Laterality Date   BIOPSY N/A 02/25/2014   Procedure: BIOPSY;  Surgeon: Rogene Houston, MD;  [facility]: [facility];  Service: Endoscopy;  Laterality: N/A;   BREAST ENHANCEMENT SURGERY     CARDIAC CATHETERIZATION  1025//2006   no significant CAD, mild-mod depressed LV systolic function, EF 52-84%, mild-mod AI (Dr. Gerrie Nordmann)    CARPAL TUNNEL RELEASE Bilateral 01/01/2016   CESAREAN SECTION     COLONOSCOPY  05/27/2011   snare polypectomy     COLONOSCOPY WITH PROPOFOL N/A 10/21/2016   Procedure: COLONOSCOPY WITH PROPOFOL;  Surgeon: Rogene Houston, MD;  [facility]: [facility];  Service: Endoscopy;  Laterality: N/A;  10:30   COLONOSCOPY WITH PROPOFOL N/A 08/02/2019   Procedure: COLONOSCOPY WITH PROPOFOL;  Surgeon: Rogene Houston, MD;  [facility]: [facility];  Service: Endoscopy;  Laterality: N/A;  7:30   ESOPHAGOGASTRODUODENOSCOPY N/A 02/25/2014   Procedure: ESOPHAGOGASTRODUODENOSCOPY (EGD);  Surgeon: Rogene Houston, MD;  [facility]: [facility];  Service: Endoscopy;  Laterality: N/A;  730   ESOPHAGOGASTRODUODENOSCOPY (EGD) WITH PROPOFOL N/A 08/02/2019   Procedure: ESOPHAGOGASTRODUODENOSCOPY (EGD) WITH PROPOFOL;  Surgeon: Rogene Houston, MD;  [facility]: [facility];  Service: Endoscopy;  Laterality: N/A;   KNEE SURGERY Right    MASS EXCISION Right 10/21/2020   Procedure: EXCISION CYST 2 CM, RIGHT AXILLA  AND EXCISION OF RIGHT FLANK SKIN LESION;  Surgeon: Virl Cagey, MD;  Location: [facility];  Service: General;  Laterality: Right;   PARTIAL KNEE ARTHROPLASTY Right 03/07/2018   Procedure: Right knee medial unicompartmental arthroplasty;  Surgeon: Gaynelle Arabian, MD;  Location: [facility];  Service: Orthopedics;  Laterality: Right;  with block   POLYPECTOMY  08/02/2019   Procedure: POLYPECTOMY;  Surgeon: Rogene Houston, MD;  [facility]: [facility];  Service: Endoscopy;;  cold snare distal sigmoid   TRANSTHORACIC ECHOCARDIOGRAM  07/2008  LV systolic function is low-normal; RV is normal in size & function; mild MR, mild TR   TUBAL LIGATION      Family Psychiatric History: Please see initial evaluation for full details. I have reviewed the history. No updates at this time.     Family History:  Family History  Problem Relation Age of Onset   Other Mother        heart problems   Other Brother        neck and back surgery   Other Maternal Grandmother        brain tumor   Leukemia Maternal Grandfather    Dementia Father      Social History:  Social History   Socioeconomic History   Marital status: Single    Spouse name: Not on file   Number of children: 4   Years of education: 9   Highest education level: Not on file  Occupational History    Employer: [employer]  Tobacco Use   Smoking status: Every Day    Packs/day: 0.50    Years: 28.00    Total pack years: 14.00    Types: Cigarettes    Start date: 10/11/1986   Smokeless tobacco: Never  Vaping Use   Vaping Use: Former   Substances: Nicotine  Substance and Sexual Activity   Alcohol use: Not Currently    Alcohol/week: 1.0 standard drink of alcohol    Types: 1 Glasses of wine per week    Comment: occasionally    Drug use: Not Currently    Types: Marijuana    Comment: twice a week   Sexual activity: Not Currently    Birth control/protection: Surgical    Comment: tubal  Other Topics Concern   Not on file  Social History Narrative   Not on file   Social Determinants of Health   Financial Resource Strain: Medium Risk (04/20/2022)   Overall Financial Resource Strain (CARDIA)    Difficulty of Paying Living Expenses: Somewhat hard  Food Insecurity: Food Insecurity Present (04/20/2022)   Hunger Vital Sign    Worried About Running Out of Food in the Last Year: Sometimes true    Ran Out of Food in the Last Year: Sometimes true  Transportation Needs: Unmet Transportation Needs (04/20/2022)   PRAPARE - Transportation    Lack of Transportation (Medical): Yes    Lack of Transportation (Non-Medical): Yes  Physical Activity: Inactive (04/20/2022)   Exercise Vital Sign    Days of Exercise per Week: 0 days    Minutes of Exercise per Session: 0 min  Stress: Stress Concern Present (04/20/2022)   [employer] [place]    Feeling of Stress : Rather much  Social Connections: Socially Isolated (04/20/2022)   Social Connection and Isolation Panel [NHANES]    Frequency of Communication with Friends and  Family: Twice a week    Frequency of Social Gatherings with Friends and Family: Never    Attends Religious Services: More than 4 times per year    Active Member of [employer] or Organizations: No    Attends Archivist Meetings: Never    Marital Status: Divorced    Allergies:  Allergies  Allergen Reactions   Dicyclomine Palpitations    Patient states that her heart rate will go real fast when she takes this medication.   Other Other (See Comments)    House dust   Flagyl [Metronidazole Hcl] Nausea And Vomiting   Morphine Itching   Morphine  And Related Other (See Comments)    Headache    Penicillins Other (See Comments)    Caused patient to pass out Did it involve swelling of the face/tongue/throat, SOB, or low BP? No Did it involve sudden or severe rash/hives, skin peeling, or any reaction on the inside of your mouth or nose? No Did you need to seek medical attention at a hospital or doctor's office? No When did it last happen?      20 + years If all above answers are "NO", may proceed with cephalosporin use.     Metabolic Disorder Labs: Lab Results  Component Value Date   HGBA1C 5.4 08/08/2017   No results found for: "PROLACTIN" Lab Results  Component Value Date   CHOL 189 08/08/2017   TRIG 244 (H) 08/08/2017   HDL 40 08/08/2017   CHOLHDL 4.7 (H) 08/08/2017   LDLCALC 100 (H) 08/08/2017   LDLCALC 72 01/18/2016   Lab Results  Component Value Date   TSH 0.34 (L) 07/29/2019   TSH 0.402 (L) 11/22/2017    Therapeutic Level Labs: No results found for: "LITHIUM" No results found for: "VALPROATE" No results found for: "CBMZ"  Current Medications: Current Outpatient Medications  Medication Sig Dispense Refill   alfuzosin (UROXATRAL) 10 MG 24 hr tablet Take 1 tablet (10 mg total) by mouth daily with breakfast. 30 tablet 5   ARIPiprazole (ABILIFY) 10 MG tablet Take 1 tablet (10 mg total) by mouth daily. 30 tablet 0   aspirin EC 81 MG tablet Take 81 mg by mouth  daily.     cetirizine (ZYRTEC) 10 MG tablet cetirizine 10 mg tablet   10 mg by oral route.     DULoxetine (CYMBALTA) 30 MG capsule Take 1 capsule (30 mg total) by mouth daily. Total of 90 mg daily (take along with 60 mg) 90 capsule 1   DULoxetine (CYMBALTA) 60 MG capsule Take 1 capsule (60 mg total) by mouth daily. 90 capsule 1   fluconazole (DIFLUCAN) 150 MG tablet Take 1 now and 1 in 3 days (Patient not taking: Reported on 04/20/2022) 2 tablet 1   fluticasone (FLONASE) 50 MCG/ACT nasal spray Place 2 sprays into both nostrils daily as needed for allergies.      loratadine (CLARITIN) 10 MG tablet Take 10 mg by mouth daily.      LORazepam (ATIVAN) 1 MG tablet Take 1 tablet (1 mg total) by mouth 2 (two) times daily as needed for anxiety. 60 tablet 1   naloxone (NARCAN) 4 MG/0.1ML LIQD nasal spray kit      Olopatadine HCl 0.2 % SOLN olopatadine 0.2 % eye drops     ondansetron (ZOFRAN) 8 MG tablet TAKE 1 TABLET BY MOUTH EVERY 8 HOURS AS NEEDED FOR NAUSEA AND VOMITING. 20 tablet 0   oxyCODONE-acetaminophen (PERCOCET) 10-325 MG tablet Take 1 tablet by mouth every 6 (six) hours as needed for pain.      pantoprazole (PROTONIX) 40 MG tablet Take 1 tablet (40 mg total) by mouth 2 (two) times daily before a meal. 60 tablet 0   predniSONE (DELTASONE) 10 MG tablet Take 10 mg by mouth 2 (two) times daily.     pregabalin (LYRICA) 150 MG capsule Take 150 mg by mouth 2 (two) times daily.     PROAIR HFA 108 (90 Base) MCG/ACT inhaler USE 2 PUFFS EVERY 6 HOURS AS NEEDED FOR WHEEZING OR SHORTNESS OF BREATH. (Patient taking differently: Inhale 2 puffs into the lungs every 6 (six) hours as needed for wheezing  or shortness of breath.) 8.5 g 0   promethazine (PHENERGAN) 25 MG tablet Take 25 mg by mouth every 6 (six) hours as needed for nausea or vomiting.      tamsulosin (FLOMAX) 0.4 MG CAPS capsule tamsulosin 0.4 mg capsule     Tetrahydrozoline HCl (VISINE OP) Place 1 drop into both eyes daily as needed (dry / allergy  eyes).     tiZANidine (ZANAFLEX) 4 MG tablet Take 4 mg by mouth 3 (three) times daily as needed.     topiramate (TOPAMAX) 25 MG tablet Take 25 mg by mouth daily.     VALTOCO 10 MG DOSE 10 MG/0.1ML LIQD SMARTSIG:1 Spray(s) Both Nares Twice Daily PRN     zolpidem (AMBIEN) 5 MG tablet Take 1 tablet (5 mg total) by mouth at bedtime as needed for sleep. 30 tablet 1   No current facility-administered medications for this visit.     Musculoskeletal: Strength & Muscle Tone: within normal limits Gait & Station: normal Patient leans: N/A  Psychiatric Specialty Exam: Review of Systems  There were no vitals taken for this visit.There is no height or weight on file to calculate BMI.  General Appearance: {Appearance:22683}  Eye Contact:  {BHH EYE CONTACT:22684}  Speech:  Clear and Coherent  Volume:  Normal  Mood:  {BHH MOOD:22306}  Affect:  {Affect (PAA):22687}  Thought Process:  Coherent  Orientation:  Full (Time, Place, and Person)  Thought Content: Logical   Suicidal Thoughts:  {ST/HT (PAA):22692}  Homicidal Thoughts:  {ST/HT (PAA):22692}  Memory:  Immediate;   Good  Judgement:  {Judgement (PAA):22694}  Insight:  {Insight (PAA):22695}  Psychomotor Activity:  Normal  Concentration:  Concentration: Good and Attention Span: Good  Recall:  Good  Fund of Knowledge: Good  Language: Good  Akathisia:  No  Handed:  Right  AIMS (if indicated): not done  Assets:  Communication Skills Desire for Improvement  ADL's:  Intact  Cognition: WNL  Sleep:  {BHH GOOD/FAIR/POOR:22877}   Screenings: GAD-7    Flowsheet Row Office Visit from 04/20/2022 in [place] Visit from 04/06/2021 in [place]  Total GAD-7 Score 20 9      PHQ2-9    Page Visit from 04/20/2022 in [facility] Video Visit from 08/19/2021 in [facility] [place] Video Visit from 04/07/2021 in [facility] Visit from  04/06/2021 in [place] Video Visit from 03/02/2021 in [place]  PHQ-2 Total Score _0 PHQ-9 Total Score _1 Flowsheet Row Video Visit from 06/03/2021 in [place] No Risk        Assessment and Plan:  DENEICE WACK is a 51 y.o. year old female with a history of PTSD, depression, anxiety, GERD, who presents for follow up appointment for below.     1. MDD (major depressive disorder), recurrent episode, moderate ([place]) 2. PTSD (post-traumatic stress disorder) There has been overall improvement in depressive symptoms since uptitration of Abilify, and this also coincided with breaking up with a man, who is married, and starting a work.  Other psychosocial stressors includes her niece being diagnosed with GSS, back pain, and her father with dementia.   Noted that she has not uptitrated duloxetine as instructed several months ago. Will continue current dose of duloxetine given she has been relatively doing well at the current dose.  Will continue Abilify as adjunctive treatment for depression.  Will continue lorazepam as needed for anxiety. Will obtain labs to rule out any medical condition contributing to her mood symptoms.  She was referred for CBT in the past.    3. Insomnia, unspecified type She reports drowsiness from trazodone.  Will switch to Ambien to target insomnia.  Discussed potential risk of drowsiness, sleepwalking. Noted that she continues to have insomnia, daytime fatigue and snoring.  She will contact the insurance company to find a provider in the area for evaluation of sleep apnea.    Plan Continue duloxetine 90 mg daily Continue Abilify 10 mg daily Continue lorazepam 1 mg twice a day as needed for anxiety  Start Ambien 5 mg at night  Discontinue Trazodone (was on 100 mg) Obtain labs (CBC, TSH) Next appointment: 7/11 at 1:30 for 30 mins, in person - on  pregabalin 75 mg BID   This clinician has discussed the side effect associated with medication prescribed during this encounter. Please refer to notes in the previous encounters for more details.     Past trials of medication: sertraline, fluoxetine, citalopram, lexapro, venlafaxine, bupropion,  quetiapine (increase in appetite), Trazodone (drowsiness),            Collaboration of Care: Collaboration of Care: {BH OP Collaboration of Care:21014065}  Patient/Guardian was advised Release of Information must be obtained prior to any record release in order to collaborate their care with an outside provider. Patient/Guardian was advised if they have not already done so to contact the registration department to sign all necessary forms in order for [place] to release information regarding their care.   Consent: Patient/Guardian gives verbal consent for treatment and assignment of benefits for services provided during this visit. Patient/Guardian expressed understanding and agreed to proceed.    Norman Clay, MD 07/06/2022, 1:37 PM

## 2022-07-07 ENCOUNTER — Ambulatory Visit: Payer: Medicaid Other | Admitting: Psychiatry

## 2022-07-21 ENCOUNTER — Other Ambulatory Visit: Payer: Self-pay | Admitting: Psychiatry

## 2022-07-21 NOTE — Telephone Encounter (Signed)
Scheduled patient for follow up appointment on 09-06-22

## 2022-07-21 NOTE — Telephone Encounter (Signed)
Ordered refill of Abilify per request. Please contact the patient to make follow up appointment. I will not be able to prescribe any more refills without evaluation.

## 2022-09-06 ENCOUNTER — Ambulatory Visit: Payer: Medicaid Other | Admitting: Psychiatry

## 2022-09-08 ENCOUNTER — Other Ambulatory Visit: Payer: Self-pay | Admitting: Psychiatry

## 2022-09-08 ENCOUNTER — Ambulatory Visit: Payer: Medicaid Other | Admitting: Psychiatry

## 2022-09-08 DIAGNOSIS — F4310 Post-traumatic stress disorder, unspecified: Secondary | ICD-10-CM

## 2022-10-05 ENCOUNTER — Other Ambulatory Visit: Payer: Self-pay | Admitting: Psychiatry

## 2022-10-16 ENCOUNTER — Encounter (INDEPENDENT_AMBULATORY_CARE_PROVIDER_SITE_OTHER): Payer: Self-pay | Admitting: Gastroenterology
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
## 2022-10-31 ENCOUNTER — Encounter: Payer: Self-pay | Admitting: Psychiatry

## 2022-10-31 ENCOUNTER — Ambulatory Visit: Payer: Medicaid Other | Admitting: Psychiatry

## 2022-10-31 ENCOUNTER — Ambulatory Visit (INDEPENDENT_AMBULATORY_CARE_PROVIDER_SITE_OTHER): Payer: Medicaid Other | Admitting: Psychiatry

## 2022-10-31 VITALS — BP 146/74 | HR 112 | Temp 98.7°F | Ht 60.0 in | Wt 138.2 lb

## 2022-10-31 DIAGNOSIS — G4700 Insomnia, unspecified: Secondary | ICD-10-CM

## 2022-10-31 DIAGNOSIS — F331 Major depressive disorder, recurrent, moderate: Secondary | ICD-10-CM | POA: Diagnosis not present

## 2022-10-31 DIAGNOSIS — F4310 Post-traumatic stress disorder, unspecified: Secondary | ICD-10-CM | POA: Diagnosis not present

## 2022-10-31 NOTE — Patient Instructions (Signed)
Continue duloxetine 90 mg daily Restart Abilify 5 mg daily  Continue lorazepam 1 mg twice a day as needed for anxiety - she declined a refill Obtain labs (CBC, TSH) Next appointment: 1/5 at 10 AM

## 2022-11-18 ENCOUNTER — Other Ambulatory Visit: Payer: Self-pay | Admitting: Psychiatry
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
## 2022-12-08 DIAGNOSIS — T402X5A Adverse effect of other opioids, initial encounter: Secondary | ICD-10-CM | POA: Insufficient documentation

## 2022-12-09 ENCOUNTER — Telehealth (INDEPENDENT_AMBULATORY_CARE_PROVIDER_SITE_OTHER): Payer: Medicaid Other | Admitting: Psychiatry

## 2022-12-09 ENCOUNTER — Encounter: Payer: Self-pay | Admitting: Psychiatry

## 2022-12-09 ENCOUNTER — Telehealth (HOSPITAL_COMMUNITY): Payer: Self-pay

## 2022-12-09 DIAGNOSIS — G4700 Insomnia, unspecified: Secondary | ICD-10-CM | POA: Diagnosis not present

## 2022-12-09 DIAGNOSIS — F4310 Post-traumatic stress disorder, unspecified: Secondary | ICD-10-CM

## 2022-12-09 DIAGNOSIS — F331 Major depressive disorder, recurrent, moderate: Secondary | ICD-10-CM | POA: Diagnosis not present

## 2022-12-09 MED ORDER — ARIPIPRAZOLE 5 MG PO TABS: 5.0000 mg | ORAL_TABLET | Freq: Every day | ORAL | 0 refills | Status: DC

## 2022-12-09 MED ORDER — DULOXETINE HCL 60 MG PO CPEP: 60.0000 mg | ORAL_CAPSULE | Freq: Every day | ORAL | 0 refills | Status: DC

## 2022-12-09 NOTE — Telephone Encounter (Signed)
Called pt to schedule appt with Julie [name] per Dr Modesta Messing no answer vm full

## 2022-12-21 NOTE — Telephone Encounter (Signed)
Called pt no answer vm not set up 
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
## 2022-12-30 ENCOUNTER — Telehealth (INDEPENDENT_AMBULATORY_CARE_PROVIDER_SITE_OTHER): Payer: Medicaid Other | Admitting: Psychiatry

## 2022-12-30 ENCOUNTER — Encounter: Payer: Self-pay | Admitting: Psychiatry

## 2022-12-30 DIAGNOSIS — G4700 Insomnia, unspecified: Secondary | ICD-10-CM | POA: Diagnosis not present

## 2022-12-30 DIAGNOSIS — F411 Generalized anxiety disorder: Secondary | ICD-10-CM

## 2022-12-30 DIAGNOSIS — F331 Major depressive disorder, recurrent, moderate: Secondary | ICD-10-CM | POA: Diagnosis not present

## 2022-12-30 DIAGNOSIS — F4310 Post-traumatic stress disorder, unspecified: Secondary | ICD-10-CM | POA: Diagnosis not present

## 2022-12-30 MED ORDER — ARIPIPRAZOLE 5 MG PO TABS: 5.0000 mg | ORAL_TABLET | Freq: Every day | ORAL | 1 refills | Status: DC

## 2022-12-30 MED ORDER — LORAZEPAM 1 MG PO TABS: 1.0000 mg | ORAL_TABLET | Freq: Two times a day (BID) | ORAL | 1 refills | Status: DC | PRN

## 2022-12-30 NOTE — Patient Instructions (Signed)
Continue duloxetine 60 mg daily Continue Abilify 5 mg at night Continue lorazepam 1 mg twice a day as needed for anxiety  Obtain labs (CBC, TSH) Referral to therapy at [facility] Next appointment: 3/18 at 4 PM

## 2023-01-03 NOTE — Telephone Encounter (Signed)
Pt scheduled  

## 2023-01-07 ENCOUNTER — Other Ambulatory Visit: Payer: Self-pay | Admitting: Psychiatry

## 2023-02-02 ENCOUNTER — Encounter: Payer: Self-pay | Admitting: Gastroenterology

## 2023-02-19 NOTE — Progress Notes (Deleted)
BH MD/PA/NP OP Progress Note  02/19/2023 6:03 AM Julie [name]  MRN:  [id number]  Chief Complaint: No chief complaint on file.  HPI: *** Visit Diagnosis: No diagnosis found.  Past Psychiatric History: Please see initial evaluation for full details. I have reviewed the history. No updates at this time.     Past Medical History:  Past Medical History:  Diagnosis Date   Anger    Anxiety    Aortic insufficiency 2006   Noted at heart cath - mild to moderate   Arthritis    Asthma    Back pain    BV (bacterial vaginosis) 05/28/2015   Chronic diarrhea    Chronic headache    Depression    Diarrhea 05/28/2015   Dyslipidemia 01/19/2016   Fibroids, submucosal 10/24/2018   GERD (gastroesophageal reflux disease)    History of kidney stones    history of   History of nuclear stress test 07/15/2008   lexiscan; mild-mod perfusion defect in mid anteroseptal, apical anterior, apical septal regions (attenuation artifiact), prominent gut uptake activity in infero-apical region; post-stress EF 64%; EKG negative for ischemia; patient experienced CP during test   Hx of cardiac catheterization 2006   no significant CAD   IBS (irritable bowel syndrome)    Irritable bowel syndrome    Nausea 05/28/2015   Neck pain    Numerous moles 01/18/2016   RLQ abdominal pain 05/28/2015   Seizures ([place])    2 years ago; unknown etiology and none since then and on no meds   Vaginal discharge 05/28/2015   Weight gain 01/18/2016    Past Surgical History:  Procedure Laterality Date   BIOPSY N/A 02/25/2014   Procedure: BIOPSY;  Surgeon: Rogene Houston, MD;  [facility];  Service: Endoscopy;  Laterality: N/A;   BREAST ENHANCEMENT SURGERY     CARDIAC CATHETERIZATION  1025//2006   no significant CAD, mild-mod depressed LV systolic function, EF 123456, mild-mod AI (Dr. Gerrie Nordmann)    CARPAL TUNNEL RELEASE Bilateral 01/01/2016   CESAREAN SECTION     COLONOSCOPY  05/27/2011   snare polypectomy     COLONOSCOPY WITH PROPOFOL N/A 10/21/2016   Procedure: COLONOSCOPY WITH PROPOFOL;  Surgeon: Rogene Houston, MD;  [facility];  Service: Endoscopy;  Laterality: N/A;  10:30   COLONOSCOPY WITH PROPOFOL N/A 08/02/2019   Procedure: COLONOSCOPY WITH PROPOFOL;  Surgeon: Rogene Houston, MD;  [facility];  Service: Endoscopy;  Laterality: N/A;  7:30   ESOPHAGOGASTRODUODENOSCOPY N/A 02/25/2014   Procedure: ESOPHAGOGASTRODUODENOSCOPY (EGD);  Surgeon: Rogene Houston, MD;  [facility];  Service: Endoscopy;  Laterality: N/A;  730   ESOPHAGOGASTRODUODENOSCOPY (EGD) WITH PROPOFOL N/A 08/02/2019   Procedure: ESOPHAGOGASTRODUODENOSCOPY (EGD) WITH PROPOFOL;  Surgeon: Rogene Houston, MD;  [facility];  Service: Endoscopy;  Laterality: N/A;   KNEE SURGERY Right    MASS EXCISION Right 10/21/2020   Procedure: EXCISION CYST 2 CM, RIGHT AXILLA  AND EXCISION OF RIGHT FLANK SKIN LESION;  Surgeon: Virl Cagey, MD;  Location: [facility];  Service: General;  Laterality: Right;   PARTIAL KNEE ARTHROPLASTY Right 03/07/2018   Procedure: Right knee medial unicompartmental arthroplasty;  Surgeon: Gaynelle Arabian, MD;  Location: [facility];  Service: Orthopedics;  Laterality: Right;  with block   POLYPECTOMY  08/02/2019   Procedure: POLYPECTOMY;  Surgeon: Rogene Houston, MD;  [facility];  Service: Endoscopy;;  cold snare distal sigmoid   TRANSTHORACIC ECHOCARDIOGRAM  07/2008  LV systolic function is low-normal; RV is normal in size & function; mild MR, mild TR   TUBAL LIGATION      Family Psychiatric History: Please see initial evaluation for full details. I have reviewed the history. No updates at this time.     Family History:  Family History  Problem Relation Age of Onset   Other Mother        heart problems   Other Brother        neck and back surgery   Other Maternal Grandmother        brain tumor   Leukemia Maternal Grandfather    Dementia Father      Social History:  Social History   Socioeconomic History   Marital status: Single    Spouse name: Not on file   Number of children: 4   Years of education: 9   Highest education level: Not on file  Occupational History    Employer: [employer]  Tobacco Use   Smoking status: Every Day    Packs/day: 0.50    Years: 28.00    Additional pack years: 0.00    Total pack years: 14.00    Types: Cigarettes    Start date: 10/11/1986   Smokeless tobacco: Never  Vaping Use   Vaping Use: Former   Substances: Nicotine  Substance and Sexual Activity   Alcohol use: Not Currently    Alcohol/week: 1.0 standard drink of alcohol    Types: 1 Glasses of wine per week    Comment: occasionally    Drug use: Not Currently    Types: Marijuana    Comment: twice a week   Sexual activity: Not Currently    Birth control/protection: Surgical    Comment: tubal  Other Topics Concern   Not on file  Social History Narrative   Not on file   Social Determinants of Health   Financial Resource Strain: Medium Risk (04/20/2022)   Overall Financial Resource Strain (CARDIA)    Difficulty of Paying Living Expenses: Somewhat hard  Food Insecurity: Food Insecurity Present (04/20/2022)   Hunger Vital Sign    Worried About Running Out of Food in the Last Year: Sometimes true    Ran Out of Food in the Last Year: Sometimes true  Transportation Needs: Unmet Transportation Needs (04/20/2022)   PRAPARE - Transportation    Lack of Transportation (Medical): Yes    Lack of Transportation (Non-Medical): Yes  Physical Activity: Inactive (04/20/2022)   Exercise Vital Sign    Days of Exercise per Week: 0 days    Minutes of Exercise per Session: 0 min  Stress: Stress Concern Present (04/20/2022)   [employer] [place]    Feeling of Stress : Rather much  Social Connections: Socially Isolated (04/20/2022)   Social Connection and Isolation Panel [NHANES]    Frequency  of Communication with Friends and Family: Twice a week    Frequency of Social Gatherings with Friends and Family: Never    Attends Religious Services: More than 4 times per year    Active Member of [employer] or Organizations: No    Attends Archivist Meetings: Never    Marital Status: Divorced    Allergies:  Allergies  Allergen Reactions   Dicyclomine Palpitations    Patient states that her heart rate will go real fast when she takes this medication.   Other Other (See Comments)    House dust   Flagyl [Metronidazole Hcl] Nausea And Vomiting  Morphine Itching   Morphine And Related Other (See Comments)    Headache    Penicillins Other (See Comments)    Caused patient to pass out Did it involve swelling of the face/tongue/throat, SOB, or low BP? No Did it involve sudden or severe rash/hives, skin peeling, or any reaction on the inside of your mouth or nose? No Did you need to seek medical attention at a hospital or doctor's office? No When did it last happen?      20 + years If all above answers are "NO", may proceed with cephalosporin use.     Metabolic Disorder Labs: Lab Results  Component Value Date   HGBA1C 5.4 08/08/2017   No results found for: "PROLACTIN" Lab Results  Component Value Date   CHOL 189 08/08/2017   TRIG 244 (H) 08/08/2017   HDL 40 08/08/2017   CHOLHDL 4.7 (H) 08/08/2017   LDLCALC 100 (H) 08/08/2017   LDLCALC 72 01/18/2016   Lab Results  Component Value Date   TSH 0.34 (L) 07/29/2019   TSH 0.402 (L) 11/22/2017    Therapeutic Level Labs: No results found for: "LITHIUM" No results found for: "VALPROATE" No results found for: "CBMZ"  Current Medications: Current Outpatient Medications  Medication Sig Dispense Refill   alfuzosin (UROXATRAL) 10 MG 24 hr tablet Take 1 tablet (10 mg total) by mouth daily with breakfast. 30 tablet 5   ARIPiprazole (ABILIFY) 5 MG tablet Take 1 tablet (5 mg total) by mouth daily. 30 tablet 1   aspirin EC 81 MG  tablet Take 81 mg by mouth daily.     cetirizine (ZYRTEC) 10 MG tablet cetirizine 10 mg tablet   10 mg by oral route.     DULoxetine (CYMBALTA) 60 MG capsule Take 1 capsule (60 mg total) by mouth daily. 90 capsule 0   fluconazole (DIFLUCAN) 150 MG tablet Take 1 now and 1 in 3 days 2 tablet 1   fluticasone (FLONASE) 50 MCG/ACT nasal spray Place 2 sprays into both nostrils daily as needed for allergies.      loratadine (CLARITIN) 10 MG tablet Take 10 mg by mouth daily.      LORazepam (ATIVAN) 1 MG tablet Take 1 tablet (1 mg total) by mouth 2 (two) times daily as needed for anxiety. 60 tablet 1   naloxone (NARCAN) 4 MG/0.1ML LIQD nasal spray kit      Olopatadine HCl 0.2 % SOLN olopatadine 0.2 % eye drops     ondansetron (ZOFRAN) 8 MG tablet TAKE 1 TABLET BY MOUTH EVERY 8 HOURS AS NEEDED FOR NAUSEA AND VOMITING. 20 tablet 0   oxyCODONE-acetaminophen (PERCOCET) 10-325 MG tablet Take 1 tablet by mouth every 6 (six) hours as needed for pain.      pantoprazole (PROTONIX) 40 MG tablet Take 1 tablet (40 mg total) by mouth 2 (two) times daily before a meal. 60 tablet 0   predniSONE (DELTASONE) 10 MG tablet Take 10 mg by mouth 2 (two) times daily.     pregabalin (LYRICA) 150 MG capsule Take 150 mg by mouth 2 (two) times daily.     PROAIR HFA 108 (90 Base) MCG/ACT inhaler USE 2 PUFFS EVERY 6 HOURS AS NEEDED FOR WHEEZING OR SHORTNESS OF BREATH. (Patient taking differently: Inhale 2 puffs into the lungs every 6 (six) hours as needed for wheezing or shortness of breath.) 8.5 g 0   promethazine (PHENERGAN) 25 MG tablet Take 25 mg by mouth every 6 (six) hours as needed for nausea or vomiting.  tamsulosin (FLOMAX) 0.4 MG CAPS capsule tamsulosin 0.4 mg capsule     Tetrahydrozoline HCl (VISINE OP) Place 1 drop into both eyes daily as needed (dry / allergy eyes).     tiZANidine (ZANAFLEX) 4 MG tablet Take 4 mg by mouth 3 (three) times daily as needed.     topiramate (TOPAMAX) 25 MG tablet Take 25 mg by mouth  daily.     VALTOCO 10 MG DOSE 10 MG/0.1ML LIQD SMARTSIG:1 Spray(s) Both Nares Twice Daily PRN     No current facility-administered medications for this visit.     Musculoskeletal: Strength & Muscle Tone:  N/A Gait & Station:  N/A Patient leans: N/A  Psychiatric Specialty Exam: Review of Systems  There were no vitals taken for this visit.There is no height or weight on file to calculate BMI.  General Appearance: {Appearance:22683}  Eye Contact:  {B[facility] EYE CONTACT:22684}  Speech:  Clear and Coherent  Volume:  Normal  Mood:  {BHH MOOD:22306}  Affect:  {Affect (PAA):22687}  Thought Process:  Coherent  Orientation:  Full (Time, Place, and Person)  Thought Content: Logical   Suicidal Thoughts:  {ST/HT (PAA):22692}  Homicidal Thoughts:  {ST/HT (PAA):22692}  Memory:  Immediate;   Good  Judgement:  {Judgement (PAA):22694}  Insight:  {Insight (PAA):22695}  Psychomotor Activity:  Normal  Concentration:  Concentration: Good and Attention Span: Good  Recall:  Good  Fund of Knowledge: Good  Language: Good  Akathisia:  No  Handed:  Right  AIMS (if indicated): not done  Assets:  Communication Skills Desire for Improvement  ADL's:  Intact  Cognition: WNL  Sleep:  {BHH GOOD/FAIR/POOR:22877}   Screenings: GAD-7    Physiological scientist Office Visit from 10/31/2022 in [place] Office Visit from 04/20/2022 in [facility] for [place] at [place] Visit from 04/06/2021 in [facility] for [place] at [facility]  Total GAD-7 Score 14 20 9       PHQ2-9    [place] Office Visit from 10/31/2022 in [place] Office Visit from 04/20/2022 in [facility] for [place] at [facility] Video Visit from 08/19/2021 in [place] Video Visit from 04/07/2021 in [place] Office Visit  from 04/06/2021 in [facility] for [place] at [facility]  PHQ-2 Total Score 4 4 5 2 5   PHQ-9 Total Score 16 20 19 12 20       [place] Office Visit from 10/31/2022 in [place] Video Visit from 06/03/2021 in [place] No Risk No Risk        Assessment and Plan:  Julie [name] is a 52 y.o. year old female with a history of  PTSD, depression, anxiety, GERD. The patient presents for follow up appointment for below.     1. PTSD (post-traumatic stress disorder) 2. MDD (major depressive disorder), recurrent episode, mild ([place]) # Anxiety state Acute stressors include: loss of her best friend  Other stressors include:her niece with Gerstmann-Strussler-Scheinker disease (GSS),  back pain, and her father with dementia., estranged  relationship with her two children   History:   There has been overall improvement in depressive symptoms and anxiety since starting Abilify.  Will continue current medication regimen at this time to see if Abilify exert its full benefit for depression.  Will continue duloxetine and Abilify to target depression.  Will continue clonazepam as needed for anxiety. She was advised to limit its use especially given its risk of drowsiness, respiratory suppression with concomitant use of opioid.  Will plan to taper down this medication at the next visit if there is continued improvement in her anxiety.  She was advised again to obtain labs to rule out medical health issues contributing to her mood symptoms.    3. Insomnia, unspecified type Unchanged. Noted that she has a history of daytime fatigue and snoring. She was advise to contact the insurance company to find a provider in the area for evaluation of sleep apnea.    Plan Continue duloxetine 60 mg daily Continue Abilify 5 mg at night (QTcB 439 msec on 09/2021). Obtain EKG at the next visit. She  has no known cardiac disease.- monitor VH Continue lorazepam 1 mg twice a day as needed for anxiety  Obtain labs (CBC, TSH) Referral to therapy at [facility]- she will contact the clinic Next appointment: 3/18 at 4 PM, video - on pregabalin 75 mg BID, oxycodone   This clinician has discussed the side effect associated with medication prescribed during this encounter. Please refer to notes in the previous encounters for more details.     Past trials of medication: sertraline, fluoxetine, citalopram, lexapro, venlafaxine, bupropion,  quetiapine (increase in appetite), Trazodone (drowsiness),  Ambien (sleep walking)      Collaboration of Care: Collaboration of Care: {BH OP Collaboration of Care:21014065}  Patient/Guardian was advised Release of Information must be obtained prior to any record release in order to collaborate their care with an outside provider. Patient/Guardian was advised if they have not already done so to contact the registration department to sign all necessary forms in order for [place] to release information regarding their care.   Consent: Patient/Guardian gives verbal consent for treatment and assignment of benefits for services provided during this visit. Patient/Guardian expressed understanding and agreed to proceed.    Norman Clay, MD 02/19/2023, 6:03 AM

## 2023-02-20 ENCOUNTER — Telehealth: Payer: Medicaid Other | Admitting: Psychiatry

## 2023-02-20 NOTE — Progress Notes (Deleted)
GI Office Note    Referring Provider: Glenda Chroman, MD Primary Care Physician:  Glenda Chroman, MD  Primary Gastroenterologist: Harvel Quale, MD  Chief Complaint   No chief complaint on file.   History of Present Illness   Julie [name] is a 52 y.o. female presenting today at the request of Vyas, Dhruv B, MD for IBS with bloating.  EGD March 2015: -Small sliding hiatal hernia -Some biopsy debris in the stomach with patent pylorus -No evidence of PUD -No changes in duodenal, negative celiac.  Last seen in the office June 2020. Reportedly recent underwent CT scan that revealed wall thickening of the sigmoid colon, questionable mild colitis or diverticulitis.  Also thickened appearance of the pylorus which appears different compared to prior.  Recommend correlation with endoscopy to exclude PUD or mass.  She was placed on Cipro 100 mg twice daily was admitted to [facility] but left AMA.  Reported abdominal pain to left mid abdomen and right abdomen.  She denied any fevers.  No bowel movement 4 days prior.  Colonoscopy August 2020: -4 mm polyp in the distal sigmoid colon -External hemorrhoids -Hypertrophied anal papilla -Advise avoid NSAIDs -Repeat colonoscopy in 7 years  EGD August 2020: -Normal esophagus, stomach, and duodenum -Advised FDgard 1 cap p.o. 3 times daily.  Today:    Current Outpatient Medications  Medication Sig Dispense Refill   alfuzosin (UROXATRAL) 10 MG 24 hr tablet Take 1 tablet (10 mg total) by mouth daily with breakfast. 30 tablet 5   ARIPiprazole (ABILIFY) 5 MG tablet Take 1 tablet (5 mg total) by mouth daily. 30 tablet 1   aspirin EC 81 MG tablet Take 81 mg by mouth daily.     cetirizine (ZYRTEC) 10 MG tablet cetirizine 10 mg tablet   10 mg by oral route.     DULoxetine (CYMBALTA) 60 MG capsule Take 1 capsule (60 mg total) by mouth daily. 90 capsule 0   fluconazole (DIFLUCAN) 150 MG tablet Take 1 now and 1 in 3 days 2 tablet 1    fluticasone (FLONASE) 50 MCG/ACT nasal spray Place 2 sprays into both nostrils daily as needed for allergies.      loratadine (CLARITIN) 10 MG tablet Take 10 mg by mouth daily.      LORazepam (ATIVAN) 1 MG tablet Take 1 tablet (1 mg total) by mouth 2 (two) times daily as needed for anxiety. 60 tablet 1   naloxone (NARCAN) 4 MG/0.1ML LIQD nasal spray kit      Olopatadine HCl 0.2 % SOLN olopatadine 0.2 % eye drops     ondansetron (ZOFRAN) 8 MG tablet TAKE 1 TABLET BY MOUTH EVERY 8 HOURS AS NEEDED FOR NAUSEA AND VOMITING. 20 tablet 0   oxyCODONE-acetaminophen (PERCOCET) 10-325 MG tablet Take 1 tablet by mouth every 6 (six) hours as needed for pain.      pantoprazole (PROTONIX) 40 MG tablet Take 1 tablet (40 mg total) by mouth 2 (two) times daily before a meal. 60 tablet 0   predniSONE (DELTASONE) 10 MG tablet Take 10 mg by mouth 2 (two) times daily.     pregabalin (LYRICA) 150 MG capsule Take 150 mg by mouth 2 (two) times daily.     PROAIR HFA 108 (90 Base) MCG/ACT inhaler USE 2 PUFFS EVERY 6 HOURS AS NEEDED FOR WHEEZING OR SHORTNESS OF BREATH. (Patient taking differently: Inhale 2 puffs into the lungs every 6 (six) hours as needed for wheezing or shortness of breath.) 8.5 g 0  promethazine (PHENERGAN) 25 MG tablet Take 25 mg by mouth every 6 (six) hours as needed for nausea or vomiting.      tamsulosin (FLOMAX) 0.4 MG CAPS capsule tamsulosin 0.4 mg capsule     Tetrahydrozoline HCl (VISINE OP) Place 1 drop into both eyes daily as needed (dry / allergy eyes).     tiZANidine (ZANAFLEX) 4 MG tablet Take 4 mg by mouth 3 (three) times daily as needed.     topiramate (TOPAMAX) 25 MG tablet Take 25 mg by mouth daily.     VALTOCO 10 MG DOSE 10 MG/0.1ML LIQD SMARTSIG:1 Spray(s) Both Nares Twice Daily PRN     No current facility-administered medications for this visit.    Past Medical History:  Diagnosis Date   Anger    Anxiety    Aortic insufficiency 2006   Noted at heart cath - mild to moderate    Arthritis    Asthma    Back pain    BV (bacterial vaginosis) 05/28/2015   Chronic diarrhea    Chronic headache    Depression    Diarrhea 05/28/2015   Dyslipidemia 01/19/2016   Fibroids, submucosal 10/24/2018   GERD (gastroesophageal reflux disease)    History of kidney stones    history of   History of nuclear stress test 07/15/2008   lexiscan; mild-mod perfusion defect in mid anteroseptal, apical anterior, apical septal regions (attenuation artifiact), prominent gut uptake activity in infero-apical region; post-stress EF 64%; EKG negative for ischemia; patient experienced CP during test   Hx of cardiac catheterization 2006   no significant CAD   IBS (irritable bowel syndrome)    Irritable bowel syndrome    Nausea 05/28/2015   Neck pain    Numerous moles 01/18/2016   RLQ abdominal pain 05/28/2015   Seizures ([place])    2 years ago; unknown etiology and none since then and on no meds   Vaginal discharge 05/28/2015   Weight gain 01/18/2016    Past Surgical History:  Procedure Laterality Date   BIOPSY N/A 02/25/2014   Procedure: BIOPSY;  Surgeon: Rogene Houston, MD;  Location: [facility];  Service: Endoscopy;  Laterality: N/A;   BREAST ENHANCEMENT SURGERY     CARDIAC CATHETERIZATION  1025//2006   no significant CAD, mild-mod depressed LV systolic function, EF 123456, mild-mod AI (Dr. Gerrie Nordmann)    CARPAL TUNNEL RELEASE Bilateral 01/01/2016   CESAREAN SECTION     COLONOSCOPY  05/27/2011   snare polypectomy    COLONOSCOPY WITH PROPOFOL N/A 10/21/2016   Procedure: COLONOSCOPY WITH PROPOFOL;  Surgeon: Rogene Houston, MD;  Location: [facility];  Service: Endoscopy;  Laterality: N/A;  10:30   COLONOSCOPY WITH PROPOFOL N/A 08/02/2019   Procedure: COLONOSCOPY WITH PROPOFOL;  Surgeon: Rogene Houston, MD;  [facility]: [facility];  Service: Endoscopy;  Laterality: N/A;  7:30   ESOPHAGOGASTRODUODENOSCOPY N/A 02/25/2014   Procedure: ESOPHAGOGASTRODUODENOSCOPY (EGD);  Surgeon: Rogene Houston, MD;  [facility]: [facility];  Service: Endoscopy;  Laterality: N/A;  730   ESOPHAGOGASTRODUODENOSCOPY (EGD) WITH PROPOFOL N/A 08/02/2019   Procedure: ESOPHAGOGASTRODUODENOSCOPY (EGD) WITH PROPOFOL;  Surgeon: Rogene Houston, MD;  [facility]: [facility];  Service: Endoscopy;  Laterality: N/A;   KNEE SURGERY Right    MASS EXCISION Right 10/21/2020   Procedure: EXCISION CYST 2 CM, RIGHT AXILLA  AND EXCISION OF RIGHT FLANK SKIN LESION;  Surgeon: Virl Cagey, MD;  Location: [facility];  Service: General;  Laterality: Right;   PARTIAL KNEE  ARTHROPLASTY Right 03/07/2018   Procedure: Right knee medial unicompartmental arthroplasty;  Surgeon: Gaynelle Arabian, MD;  Location: [facility];  Service: Orthopedics;  Laterality: Right;  with block   POLYPECTOMY  08/02/2019   Procedure: POLYPECTOMY;  Surgeon: Rogene Houston, MD;  [facility]: [facility];  Service: Endoscopy;;  cold snare distal sigmoid   TRANSTHORACIC ECHOCARDIOGRAM  99991111   LV systolic function is low-normal; RV is normal in size & function; mild MR, mild TR   TUBAL LIGATION      Family History  Problem Relation Age of Onset   Other Mother        heart problems   Other Brother        neck and back surgery   Other Maternal Grandmother        brain tumor   Leukemia Maternal Grandfather    Dementia Father     Allergies as of 02/21/2023 - Review Complete 12/30/2022  Allergen Reaction Noted   Dicyclomine Palpitations 01/11/2018   Other Other (See Comments) 02/07/2019   Flagyl [metronidazole hcl] Nausea And Vomiting 07/04/2011   Morphine Itching 04/06/2021   Morphine and related Other (See Comments) 02/21/2018   Penicillins Other (See Comments) 03/05/2011    Social History   Socioeconomic History   Marital status: Single    Spouse name: Not on file   Number of children: 4   Years of education: 9   Highest education level: Not on file  Occupational History    Employer: [employer]  Tobacco Use   Smoking status: Every  Day    Packs/day: 0.50    Years: 28.00    Additional pack years: 0.00    Total pack years: 14.00    Types: Cigarettes    Start date: 10/11/1986   Smokeless tobacco: Never  Vaping Use   Vaping Use: Former   Substances: Nicotine  Substance and Sexual Activity   Alcohol use: Not Currently    Alcohol/week: 1.0 standard drink of alcohol    Types: 1 Glasses of wine per week    Comment: occasionally    Drug use: Not Currently    Types: Marijuana    Comment: twice a week   Sexual activity: Not Currently    Birth control/protection: Surgical    Comment: tubal  Other Topics Concern   Not on file  Social History Narrative   Not on file   Social Determinants of Health   Financial Resource Strain: Medium Risk (04/20/2022)   Overall Financial Resource Strain (CARDIA)    Difficulty of Paying Living Expenses: Somewhat hard  Food Insecurity: Food Insecurity Present (04/20/2022)   Hunger Vital Sign    Worried About Running Out of Food in the Last Year: Sometimes true    Ran Out of Food in the Last Year: Sometimes true  Transportation Needs: Unmet Transportation Needs (04/20/2022)   PRAPARE - Transportation    Lack of Transportation (Medical): Yes    Lack of Transportation (Non-Medical): Yes  Physical Activity: Inactive (04/20/2022)   Exercise Vital Sign    Days of Exercise per Week: 0 days    Minutes of Exercise per Session: 0 min  Stress: Stress Concern Present (04/20/2022)   [employer] [place]    Feeling of Stress : Rather much  Social Connections: Socially Isolated (04/20/2022)   Social Connection and Isolation Panel [NHANES]    Frequency of Communication with Friends and Family: Twice a week    Frequency of Social Gatherings with  Friends and Family: Never    Attends Religious Services: More than 4 times per year    Active Member of Clubs or Organizations: No    Attends Archivist Meetings: Never    Marital Status:  Divorced  Human resources officer Violence: Not At Risk (04/20/2022)   Humiliation, Afraid, Rape, and Kick questionnaire    Fear of Current or Ex-Partner: No    Emotionally Abused: No    Physically Abused: No    Sexually Abused: No     Review of Systems   Gen: Denies any fever, chills, fatigue, weight loss, lack of appetite.  CV: Denies chest pain, heart palpitations, peripheral edema, syncope.  Resp: Denies shortness of breath at rest or with exertion. Denies wheezing or cough.  GI: see HPI GU : Denies urinary burning, urinary frequency, urinary hesitancy MS: Denies joint pain, muscle weakness, cramps, or limitation of movement.  Derm: Denies rash, itching, dry skin Psych: Denies depression, anxiety, memory loss, and confusion Heme: Denies bruising, bleeding, and enlarged lymph nodes.   Physical Exam   LMP  (LMP Unknown)   General:   Alert and oriented. Pleasant and cooperative. Well-nourished and well-developed.  Head:  Normocephalic and atraumatic. Eyes:  Without icterus, sclera clear and conjunctiva pink.  Ears:  Normal auditory acuity. Mouth:  No deformity or lesions, oral mucosa pink.  Lungs:  Clear to auscultation bilaterally. No wheezes, rales, or rhonchi. No distress.  Heart:  S1, S2 present without murmurs appreciated.  Abdomen:  +BS, soft, non-tender and non-distended. No HSM noted. No guarding or rebound. No masses appreciated.  Rectal:  Deferred  Msk:  Symmetrical without gross deformities. Normal posture. Extremities:  Without edema. Neurologic:  Alert and  oriented x4;  grossly normal neurologically. Skin:  Intact without significant lesions or rashes. Psych:  Alert and cooperative. Normal mood and affect.   Assessment   Julie [name] is a 52 y.o. female with a history of anxiety, asthma, aortic insufficiency, depression, dyslipidemia, GERD, IBS with diarrhea, right lower quadrant abdominal pain, seizures presenting today with   IBS with  diarrhea:  GERD:     PLAN   ***    Venetia Night, MSN, FNP-BC, AGACNP-BC [facility]

## 2023-02-21 ENCOUNTER — Ambulatory Visit: Payer: Medicaid Other | Admitting: Gastroenterology

## 2023-02-22 ENCOUNTER — Encounter: Payer: Self-pay | Admitting: Gastroenterology

## 2023-03-01 ENCOUNTER — Encounter (HOSPITAL_COMMUNITY): Payer: Self-pay

## 2023-03-01 ENCOUNTER — Ambulatory Visit (HOSPITAL_COMMUNITY): Payer: Medicaid Other | Admitting: Clinical
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
## 2023-03-09 ENCOUNTER — Encounter: Payer: Self-pay | Admitting: Psychiatry

## 2023-03-09 ENCOUNTER — Telehealth (INDEPENDENT_AMBULATORY_CARE_PROVIDER_SITE_OTHER): Payer: Medicaid Other | Admitting: Psychiatry

## 2023-03-09 DIAGNOSIS — F4310 Post-traumatic stress disorder, unspecified: Secondary | ICD-10-CM | POA: Diagnosis not present

## 2023-03-09 DIAGNOSIS — F411 Generalized anxiety disorder: Secondary | ICD-10-CM

## 2023-03-09 DIAGNOSIS — G4700 Insomnia, unspecified: Secondary | ICD-10-CM

## 2023-03-09 DIAGNOSIS — F331 Major depressive disorder, recurrent, moderate: Secondary | ICD-10-CM

## 2023-03-09 MED ORDER — ARIPIPRAZOLE 5 MG PO TABS: 5.0000 mg | ORAL_TABLET | Freq: Every day | ORAL | 0 refills | Status: DC

## 2023-03-09 MED ORDER — LORAZEPAM 1 MG PO TABS: ORAL_TABLET | ORAL | 1 refills | Status: DC

## 2023-03-09 MED ORDER — DULOXETINE HCL 60 MG PO CPEP: 60.0000 mg | ORAL_CAPSULE | Freq: Every day | ORAL | 0 refills | Status: DC

## 2023-03-09 NOTE — Patient Instructions (Signed)
Continue duloxetine 60 mg daily Continue Abilify 5 mg at night Obtain EKG  Decrease lorazepam 1 mg in AM, 0.5 mg in PM prn for anxiety  Obtain labs (CBC, TSH) Referral to therapy at [facility] Next appointment: 5/28 at 1:30

## 2023-03-27 NOTE — Progress Notes (Deleted)
Cardiology Office Note    Date:  03/27/2023   ID:  Julie, [name] [birth year], MRN [id number]  PCP:  Ignatius Specking, MD  Cardiologist:  None  Electrophysiologist:  None   Chief Complaint: ***  History of Present Illness:   Julie [name] is a 52 y.o. female with history of anxiety, GERD, NICM, aortic insufficiency by cath 2006 not seen later, mild MR, palpitations, arthritis, PTSD, depression, dyslipidemia, IBS, seizures seen for overdue follow-up.  Per chart review, had remote cath in 2006 for decreased EF (?45-50% on echo with decreased sensitivity due to breast implants) which showed no significant CAD, EF 35-40% by LV gram, mild-moderate AI. Per notes, it seems that there was some concern that she might have had either transient coronary spasm or perhaps a Takatsubo cardiomyopathy. She was later followed by several providers in our office for chest pain and palpitations. She underwent several monitors with overall benign findings - 10/2015 NSR (triggers corresponding to sinus), 09/2021 NSR with rare ectopy (triggers corresponding to NSR or mild sinus tach), 10/2021 NSR avg HR 100bpm with rare ectopy and one episode of SVT. Last echo 11/2021 EF 55-60%, G1DD, mild MR.  Mg level Echo 11/2024 Borderline sinus tach Inappropirrate?  NICM Palpitations with rare PACs, PVCs, PSVT Mild mitral regurgitation Prior aortic insufficiency       Labwork independently reviewed: [facility] DXA 01/2023 Hgb 11.5, plt 360, CMET OK except AP 133 - K 4.4, Cr 0.89, TSH wnl 2021 trig 173, LDL 102   Past History   Past Medical History:  Diagnosis Date   Anger    Anxiety    Aortic insufficiency 2006   Noted at heart cath - mild to moderate   Arthritis    Asthma    Back pain    BV (bacterial vaginosis) 05/28/2015   Chronic diarrhea    Chronic headache    Depression    Diarrhea 05/28/2015   Dyslipidemia 01/19/2016   Fibroids, submucosal 10/24/2018   GERD (gastroesophageal reflux  disease)    History of kidney stones    history of   History of nuclear stress test 07/15/2008   lexiscan; mild-mod perfusion defect in mid anteroseptal, apical anterior, apical septal regions (attenuation artifiact), prominent gut uptake activity in infero-apical region; post-stress EF 64%; EKG negative for ischemia; patient experienced CP during test   Hx of cardiac catheterization 2006   no significant CAD   IBS (irritable bowel syndrome)    Irritable bowel syndrome    Nausea 05/28/2015   Neck pain    Numerous moles 01/18/2016   RLQ abdominal pain 05/28/2015   Seizures    2 years ago; unknown etiology and none since then and on no meds   Vaginal discharge 05/28/2015   Weight gain 01/18/2016    Past Surgical History:  Procedure Laterality Date   BIOPSY N/A 02/25/2014   Procedure: BIOPSY;  Surgeon: Malissa Hippo, MD;  [facility];  Service: Endoscopy;  Laterality: N/A;   BREAST ENHANCEMENT SURGERY     CARDIAC CATHETERIZATION  1025//2006   no significant CAD, mild-mod depressed LV systolic function, EF 35-40%, mild-mod AI (Dr. Laurell Josephs)    CARPAL TUNNEL RELEASE Bilateral 01/01/2016   CESAREAN SECTION     COLONOSCOPY  05/27/2011   snare polypectomy    COLONOSCOPY WITH PROPOFOL N/A 10/21/2016   Procedure: COLONOSCOPY WITH PROPOFOL;  Surgeon: Malissa Hippo, MD;  [facility]: [facility];  Service: Endoscopy;  Laterality: N/A;  10:30  COLONOSCOPY WITH PROPOFOL N/A 08/02/2019   Procedure: COLONOSCOPY WITH PROPOFOL;  Surgeon: Malissa Hippo, MD;  [facility];  Service: Endoscopy;  Laterality: N/A;  7:30   ESOPHAGOGASTRODUODENOSCOPY N/A 02/25/2014   Procedure: ESOPHAGOGASTRODUODENOSCOPY (EGD);  Surgeon: Malissa Hippo, MD;  [facility];  Service: Endoscopy;  Laterality: N/A;  730   ESOPHAGOGASTRODUODENOSCOPY (EGD) WITH PROPOFOL N/A 08/02/2019   Procedure: ESOPHAGOGASTRODUODENOSCOPY (EGD) WITH PROPOFOL;  Surgeon: Malissa Hippo, MD;  [facility];  Service: Endoscopy;  Laterality: N/A;   KNEE SURGERY Right    MASS EXCISION Right 10/21/2020   Procedure: EXCISION CYST 2 CM, RIGHT AXILLA  AND EXCISION OF RIGHT FLANK SKIN LESION;  Surgeon: Lucretia Roers, MD;  Location: [facility];  Service: General;  Laterality: Right;   PARTIAL KNEE ARTHROPLASTY Right 03/07/2018   Procedure: Right knee medial unicompartmental arthroplasty;  Surgeon: Ollen Gross, MD;  Location: [facility];  Service: Orthopedics;  Laterality: Right;  with block   POLYPECTOMY  08/02/2019   Procedure: POLYPECTOMY;  Surgeon: Malissa Hippo, MD;  [facility];  Service: Endoscopy;;  cold snare distal sigmoid   TRANSTHORACIC ECHOCARDIOGRAM  07/2008   LV systolic function is low-normal; RV is normal in size & function; mild MR, mild TR   TUBAL LIGATION      Current Medications: No outpatient medications have been marked as taking for the 03/28/23 encounter (Appointment) with Laurann Montana, PA-C.   ***   Allergies:   Dicyclomine, Other, Flagyl [metronidazole hcl], Morphine, Morphine and related, and Penicillins   Social History   Socioeconomic History   Marital status: Single    Spouse name: Not on file   Number of children: 4   Years of education: 9   Highest education level: Not on file  Occupational History    Employer: [employer]  Tobacco Use   Smoking status: Every Day    Packs/day: 0.50    Years: 28.00    Additional pack years: 0.00    Total pack years: 14.00    Types: Cigarettes    Start date: 10/11/1986   Smokeless tobacco: Never  Vaping Use   Vaping Use: Former   Substances: Nicotine  Substance and Sexual Activity   Alcohol use: Not Currently    Alcohol/week: 1.0 standard drink of alcohol    Types: 1 Glasses of wine per week    Comment: occasionally    Drug use: Not Currently    Types: Marijuana    Comment: twice a week   Sexual activity: Not Currently    Birth control/protection: Surgical    Comment: tubal  Other Topics Concern    Not on file  Social History Narrative   Not on file   Social Determinants of Health   Financial Resource Strain: Medium Risk (04/20/2022)   Overall Financial Resource Strain (CARDIA)    Difficulty of Paying Living Expenses: Somewhat hard  Food Insecurity: Food Insecurity Present (04/20/2022)   Hunger Vital Sign    Worried About Running Out of Food in the Last Year: Sometimes true    Ran Out of Food in the Last Year: Sometimes true  Transportation Needs: Unmet Transportation Needs (04/20/2022)   PRAPARE - Transportation    Lack of Transportation (Medical): Yes    Lack of Transportation (Non-Medical): Yes  Physical Activity: Inactive (04/20/2022)   Exercise Vital Sign    Days of Exercise per Week: 0 days    Minutes of Exercise per Session: 0 min  Stress: Stress Concern Present (04/20/2022)   [employer] of Occupational Health - Occupational Stress Questionnaire    Feeling of Stress : Rather much  Social Connections: Socially Isolated (04/20/2022)   Social Connection and Isolation Panel [NHANES]    Frequency of Communication with Friends and Family: Twice a week    Frequency of Social Gatherings with Friends and Family: Never    Attends Religious Services: More than 4 times per year    Active Member of [employer] or Organizations: No    Attends Engineer, structural: Never    Marital Status: Divorced     Family History:  The patient's ***family history includes Dementia in her father; Leukemia in her maternal grandfather; Other in her brother, maternal grandmother, and mother.  ROS:   Please see the history of present illness. Otherwise, review of systems is positive for ***.  All other systems are reviewed and otherwise negative.    EKG(s)/Additional Testing   EKG:  EKG is ordered today, personally reviewed, demonstrating ***  CV Studies: Cardiac studies reviewed are outlined and summarized above. Otherwise please see EMR for full report.  Recent Labs: No results  found for requested labs within last 365 days.  Recent Lipid Panel    Component Value Date/Time   CHOL 189 08/08/2017 1636   TRIG 244 (H) 08/08/2017 1636   HDL 40 08/08/2017 1636   CHOLHDL 4.7 (H) 08/08/2017 1636   LDLCALC 100 (H) 08/08/2017 1636    PHYSICAL EXAM:    VS:  LMP  (LMP Unknown)   BMI: There is no height or weight on file to calculate BMI.  GEN: Well nourished, well developed female in no acute distress HEENT: normocephalic, atraumatic Neck: no JVD, carotid bruits, or masses Cardiac: ***RRR; no murmurs, rubs, or gallops, no edema  Respiratory:  clear to auscultation bilaterally, normal work of breathing GI: soft, nontender, nondistended, + BS MS: no deformity or atrophy Skin: warm and dry, no rash Neuro:  Alert and Oriented x 3, Strength and sensation are intact, follows commands Psych: euthymic mood, full affect  Wt Readings from Last 3 Encounters:  04/20/22 130 lb (59 kg)  09/29/21 131 lb 6.4 oz (59.6 kg)  09/29/21 131 lb 2 oz (59.5 kg)     ASSESSMENT & PLAN:   ***     Disposition: F/u with ***   Medication Adjustments/Labs and Tests Ordered: Current medicines are reviewed at length with the patient today.  Concerns regarding medicines are outlined above. Medication changes, Labs and Tests ordered today are summarized above and listed in the Patient Instructions accessible in Encounters.    Signed, Laurann Montana, PA-C  03/27/2023 12:11 PM    [facility] [place] Location in [facility] [address] [place], [place] [address] Ph: [phone]; Fax [phone]

## 2023-03-28 ENCOUNTER — Telehealth: Payer: Self-pay

## 2023-03-28 ENCOUNTER — Ambulatory Visit: Payer: Medicaid Other | Admitting: Physician Assistant

## 2023-03-28 DIAGNOSIS — I493 Ventricular premature depolarization: Secondary | ICD-10-CM

## 2023-03-28 DIAGNOSIS — I428 Other cardiomyopathies: Secondary | ICD-10-CM

## 2023-03-28 DIAGNOSIS — I471 Supraventricular tachycardia, unspecified: Secondary | ICD-10-CM

## 2023-03-28 DIAGNOSIS — I351 Nonrheumatic aortic (valve) insufficiency: Secondary | ICD-10-CM

## 2023-03-28 DIAGNOSIS — I491 Atrial premature depolarization: Secondary | ICD-10-CM

## 2023-03-28 DIAGNOSIS — R002 Palpitations: Secondary | ICD-10-CM

## 2023-03-28 DIAGNOSIS — I340 Nonrheumatic mitral (valve) insufficiency: Secondary | ICD-10-CM

## 2023-03-28 NOTE — Telephone Encounter (Signed)
Patient called at 1:41 pm to cancel 2 pm appointment. Comment was patient sick.  Rescheduled for 04/29/23

## 2023-04-20 ENCOUNTER — Ambulatory Visit (INDEPENDENT_AMBULATORY_CARE_PROVIDER_SITE_OTHER): Payer: Medicaid Other | Admitting: Psychiatry

## 2023-04-20 ENCOUNTER — Encounter (HOSPITAL_COMMUNITY): Payer: Self-pay | Admitting: Psychiatry

## 2023-04-20 DIAGNOSIS — F331 Major depressive disorder, recurrent, moderate: Secondary | ICD-10-CM

## 2023-04-20 DIAGNOSIS — F4310 Post-traumatic stress disorder, unspecified: Secondary | ICD-10-CM
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
## 2023-04-28 NOTE — Progress Notes (Deleted)
Cardiology Office Note   Date:  04/28/2023   ID:  Julie [name], Julie [name] June 21, 1971, MRN [id number]  PCP:  Ignatius Specking, MD  Cardiologist:  ***  last seen by Dr. Anne Fu 09/29/21  No chief complaint on file.     History of Present Illness: Julie [name] is a 52 y.o. female who presents for ***  She has hx of palpitations and prior remote heart cath in 2006, nuc studies 2009 and 2015 that were normal.  Mild to mod AI, though Echo in 2016 without AI.   She does smoke.    Echo 2022 with no AI, no AS and EF normal.  Event monitor with SR rare PACs and PVCs, SVT lasting 4 min.  No a fib     Today ***  Past Medical History:  Diagnosis Date   Anger    Anxiety    Aortic insufficiency 2006   Noted at heart cath - mild to moderate   Arthritis    Asthma    Back pain    BV (bacterial vaginosis) 05/28/2015   Chronic diarrhea    Chronic headache    Depression    Diarrhea 05/28/2015   Dyslipidemia 01/19/2016   Fibroids, submucosal 10/24/2018   GERD (gastroesophageal reflux disease)    History of kidney stones    history of   History of nuclear stress test 07/15/2008   lexiscan; mild-mod perfusion defect in mid anteroseptal, apical anterior, apical septal regions (attenuation artifiact), prominent gut uptake activity in infero-apical region; post-stress EF 64%; EKG negative for ischemia; patient experienced CP during test   Hx of cardiac catheterization 2006   no significant CAD   IBS (irritable bowel syndrome)    Irritable bowel syndrome    Nausea 05/28/2015   Neck pain    Numerous moles 01/18/2016   RLQ abdominal pain 05/28/2015   Seizures (HCC)    2 years ago; unknown etiology and none since then and on no meds   Vaginal discharge 05/28/2015   Weight gain 01/18/2016    Past Surgical History:  Procedure Laterality Date   BIOPSY N/A 02/25/2014   Procedure: BIOPSY;  Surgeon: Malissa Hippo, MD;  Location: [facility];  Service: Endoscopy;  Laterality: N/A;   BREAST ENHANCEMENT  SURGERY     CARDIAC CATHETERIZATION  1025//2006   no significant CAD, mild-mod depressed LV systolic function, EF 35-40%, mild-mod AI (Dr. Laurell Josephs)    CARPAL TUNNEL RELEASE Bilateral 01/01/2016   CESAREAN SECTION     COLONOSCOPY  05/27/2011   snare polypectomy    COLONOSCOPY WITH PROPOFOL N/A 10/21/2016   Procedure: COLONOSCOPY WITH PROPOFOL;  Surgeon: Malissa Hippo, MD;  Location: [facility];  Service: Endoscopy;  Laterality: N/A;  10:30   COLONOSCOPY WITH PROPOFOL N/A 08/02/2019   Procedure: COLONOSCOPY WITH PROPOFOL;  Surgeon: Malissa Hippo, MD;  Location: [facility];  Service: Endoscopy;  Laterality: N/A;  7:30   ESOPHAGOGASTRODUODENOSCOPY N/A 02/25/2014   Procedure: ESOPHAGOGASTRODUODENOSCOPY (EGD);  Surgeon: Malissa Hippo, MD;  Location: [facility];  Service: Endoscopy;  Laterality: N/A;  730   ESOPHAGOGASTRODUODENOSCOPY (EGD) WITH PROPOFOL N/A 08/02/2019   Procedure: ESOPHAGOGASTRODUODENOSCOPY (EGD) WITH PROPOFOL;  Surgeon: Malissa Hippo, MD;  Location: [facility];  Service: Endoscopy;  Laterality: N/A;   KNEE SURGERY Right    MASS EXCISION Right 10/21/2020   Procedure: EXCISION CYST 2 CM, RIGHT AXILLA  AND EXCISION OF RIGHT FLANK SKIN LESION;  Surgeon: Lucretia Roers, MD;  Location: [facility];  Service: General;  Laterality: Right;   PARTIAL KNEE ARTHROPLASTY Right 03/07/2018   Procedure: Right knee medial unicompartmental arthroplasty;  Surgeon: Ollen Gross, MD;  Location: [facility];  Service: Orthopedics;  Laterality: Right;  with block   POLYPECTOMY  08/02/2019   Procedure: POLYPECTOMY;  Surgeon: Malissa Hippo, MD;  Location: [facility];  Service: Endoscopy;;  cold snare distal sigmoid   TRANSTHORACIC ECHOCARDIOGRAM  07/2008   LV systolic function is low-normal; RV is normal in size & function; mild MR, mild TR   TUBAL LIGATION       Current Outpatient Medications  Medication Sig Dispense Refill   alfuzosin (UROXATRAL) 10 MG 24 hr tablet Take 1 tablet  (10 mg total) by mouth daily with breakfast. 30 tablet 5   ARIPiprazole (ABILIFY) 5 MG tablet Take 1 tablet (5 mg total) by mouth daily. 90 tablet 0   aspirin EC 81 MG tablet Take 81 mg by mouth daily. (Patient not taking: Reported on 04/20/2023)     cetirizine (ZYRTEC) 10 MG tablet cetirizine 10 mg tablet   10 mg by oral route.     DULoxetine (CYMBALTA) 60 MG capsule Take 1 capsule (60 mg total) by mouth daily. 90 capsule 0   fluconazole (DIFLUCAN) 150 MG tablet Take 1 now and 1 in 3 days (Patient not taking: Reported on 04/20/2023) 2 tablet 1   fluticasone (FLONASE) 50 MCG/ACT nasal spray Place 2 sprays into both nostrils daily as needed for allergies.      loratadine (CLARITIN) 10 MG tablet Take 10 mg by mouth daily.      LORazepam (ATIVAN) 1 MG tablet May take 1 tablet (1 mg total) by mouth daily as needed for anxiety. May also take 0.5 tablets (0.5 mg total) at bedtime as needed for anxiety. 45 tablet 1   naloxone (NARCAN) 4 MG/0.1ML LIQD nasal spray kit      Olopatadine HCl 0.2 % SOLN olopatadine 0.2 % eye drops (Patient not taking: Reported on 04/20/2023)     ondansetron (ZOFRAN) 8 MG tablet TAKE 1 TABLET BY MOUTH EVERY 8 HOURS AS NEEDED FOR NAUSEA AND VOMITING. 20 tablet 0   oxyCODONE-acetaminophen (PERCOCET) 10-325 MG tablet Take 1 tablet by mouth every 6 (six) hours as needed for pain.      pantoprazole (PROTONIX) 40 MG tablet Take 1 tablet (40 mg total) by mouth 2 (two) times daily before a meal. 60 tablet 0   predniSONE (DELTASONE) 10 MG tablet Take 10 mg by mouth 2 (two) times daily. (Patient not taking: Reported on 04/20/2023)     pregabalin (LYRICA) 150 MG capsule Take 150 mg by mouth 2 (two) times daily.     PROAIR HFA 108 (90 Base) MCG/ACT inhaler USE 2 PUFFS EVERY 6 HOURS AS NEEDED FOR WHEEZING OR SHORTNESS OF BREATH. (Patient taking differently: Inhale 2 puffs into the lungs every 6 (six) hours as needed for wheezing or shortness of breath.) 8.5 g 0   promethazine (PHENERGAN) 25 MG  tablet Take 25 mg by mouth every 6 (six) hours as needed for nausea or vomiting.      tamsulosin (FLOMAX) 0.4 MG CAPS capsule tamsulosin 0.4 mg capsule (Patient not taking: Reported on 04/20/2023)     Tetrahydrozoline HCl (VISINE OP) Place 1 drop into both eyes daily as needed (dry / allergy eyes). (Patient not taking: Reported on 04/20/2023)     tiZANidine (ZANAFLEX) 4 MG tablet Take 4 mg by mouth 3 (three) times daily as needed.  topiramate (TOPAMAX) 25 MG tablet Take 25 mg by mouth daily. (Patient not taking: Reported on 04/20/2023)     VALTOCO 10 MG DOSE 10 MG/0.1ML LIQD SMARTSIG:1 Spray(s) Both Nares Twice Daily PRN (Patient not taking: Reported on 04/20/2023)     No current facility-administered medications for this visit.    Allergies:   Dicyclomine, Other, Flagyl [metronidazole hcl], Morphine, Morphine and codeine, and Penicillins    Social History:  The patient  reports that she has been smoking cigarettes. She started smoking about 36 years ago. She has a 14.00 pack-year smoking history. She has never used smokeless tobacco. She reports that she does not currently use alcohol after a past usage of about 1.0 standard drink of alcohol per week. She reports that she does not currently use drugs.   Family History:  The patient's ***family history includes Anxiety disorder in her brother, sister, and sister; Dementia in her father; Depression in her brother and sister; Leukemia in her maternal grandfather; Other in her brother, maternal grandmother, and mother.    ROS:  General:no colds or fevers, no weight changes Skin:no rashes or ulcers HEENT:no blurred vision, no congestion CV:see HPI PUL:see HPI GI:no diarrhea constipation or melena, no indigestion GU:no hematuria, no dysuria MS:no joint pain, no claudication Neuro:no syncope, no lightheadedness Endo:no diabetes, no thyroid disease Wt Readings from Last 3 Encounters:  04/20/22 130 lb (59 kg)  09/29/21 131 lb 6.4 oz (59.6 kg)   09/29/21 131 lb 2 oz (59.5 kg)     PHYSICAL EXAM: VS:  LMP  (LMP Unknown)  , BMI There is no height or weight on file to calculate BMI. General:Pleasant affect, NAD Skin:Warm and dry, brisk capillary refill HEENT:normocephalic, sclera clear, mucus membranes moist Neck:supple, no JVD, no bruits  Heart:S1S2 RRR without murmur, gallup, rub or click Lungs:clear without rales, rhonchi, or wheezes WJX:BJYN, non tender, + BS, do not palpate liver spleen or masses Ext:no lower ext edema, 2+ pedal pulses, 2+ radial pulses Neuro:alert and oriented, MAE, follows commands, + facial symmetry    EKG:  EKG is ordered today. The ekg ordered today demonstrates ***   Recent Labs: No results found for requested labs within last 365 days.    Lipid Panel    Component Value Date/Time   CHOL 189 08/08/2017 1636   TRIG 244 (H) 08/08/2017 1636   HDL 40 08/08/2017 1636   CHOLHDL 4.7 (H) 08/08/2017 1636   LDLCALC 100 (H) 08/08/2017 1636       Other studies Reviewed: Additional studies/ records that were reviewed today include: ***.   ASSESSMENT AND PLAN:  1.  ***   Current medicines are reviewed with the patient today.  The patient Has no concerns regarding medicines.  The following changes have been made:  See above Labs/ tests ordered today include:see above  Disposition:   FU:  see above  Signed, Nada Boozer, NP  04/28/2023 3:11 PM    [facility] [address] [place], [place], [place]  [address]/ [address] [place], [place] Phone: [phone]; Fax: [phone]  [phone]
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
## 2023-05-02 ENCOUNTER — Encounter: Payer: Self-pay | Admitting: Cardiology

## 2023-05-02 ENCOUNTER — Ambulatory Visit: Payer: Medicaid Other | Attending: Physician Assistant | Admitting: Cardiology

## 2023-05-04 ENCOUNTER — Encounter: Payer: Self-pay | Admitting: Cardiology

## 2023-05-04 DIAGNOSIS — G40909 Epilepsy, unspecified, not intractable, without status epilepticus: Secondary | ICD-10-CM | POA: Insufficient documentation

## 2023-05-09 ENCOUNTER — Encounter: Payer: Self-pay | Admitting: Psychiatry

## 2023-05-09 ENCOUNTER — Telehealth (INDEPENDENT_AMBULATORY_CARE_PROVIDER_SITE_OTHER): Payer: Medicaid Other | Admitting: Psychiatry

## 2023-05-09 DIAGNOSIS — F4310 Post-traumatic stress disorder, unspecified: Secondary | ICD-10-CM | POA: Diagnosis not present

## 2023-05-09 DIAGNOSIS — F330 Major depressive disorder, recurrent, mild: Secondary | ICD-10-CM

## 2023-05-09 DIAGNOSIS — G4700 Insomnia, unspecified: Secondary | ICD-10-CM | POA: Diagnosis not present

## 2023-05-09 MED ORDER — ARIPIPRAZOLE 5 MG PO TABS: 5.0000 mg | ORAL_TABLET | Freq: Every day | ORAL | 0 refills | Status: DC

## 2023-05-09 MED ORDER — LORAZEPAM 1 MG PO TABS: ORAL_TABLET | ORAL | 1 refills | Status: DC

## 2023-05-09 MED ORDER — DULOXETINE HCL 60 MG PO CPEP: 60.0000 mg | ORAL_CAPSULE | Freq: Every day | ORAL | 0 refills | Status: DC

## 2023-05-24 ENCOUNTER — Telehealth: Payer: Self-pay

## 2023-05-29 ENCOUNTER — Ambulatory Visit: Payer: Medicaid Other | Admitting: Urology

## 2023-06-13 ENCOUNTER — Encounter (HOSPITAL_COMMUNITY): Payer: Self-pay

## 2023-06-13 ENCOUNTER — Ambulatory Visit (INDEPENDENT_AMBULATORY_CARE_PROVIDER_SITE_OTHER): Payer: Medicaid Other | Admitting: Psychiatry

## 2023-06-13 DIAGNOSIS — F330 Major depressive disorder, recurrent, mild: Secondary | ICD-10-CM

## 2023-06-13 DIAGNOSIS — F4310 Post-traumatic stress disorder, unspecified: Secondary | ICD-10-CM
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
## 2023-06-15 ENCOUNTER — Ambulatory Visit: Payer: Medicaid Other | Admitting: Urology

## 2023-06-22 ENCOUNTER — Other Ambulatory Visit: Payer: Self-pay | Admitting: Urology

## 2023-06-22 ENCOUNTER — Ambulatory Visit: Payer: Medicaid Other | Admitting: Urology

## 2023-06-22 ENCOUNTER — Ambulatory Visit (HOSPITAL_COMMUNITY)
Admission: RE | Admit: 2023-06-22 | Discharge: 2023-06-22 | Disposition: A | Discharge status: Home / Self Care | Payer: Medicaid Other | Source: Ambulatory Visit | Attending: Urology | Admitting: Urology

## 2023-06-22 DIAGNOSIS — Z87442 Personal history of urinary calculi: Secondary | ICD-10-CM | POA: Diagnosis present

## 2023-06-22 DIAGNOSIS — R109 Unspecified abdominal pain: Secondary | ICD-10-CM

## 2023-06-22 NOTE — Progress Notes (Deleted)
Name: Julie [name] DOB: [birth year] MRN: [id number]  History of Present Illness: Julie [name] is a 52 y.o. female who presents today for follow up visit at [facility]. - GU history: 1. OAB with urinary frequency, urgency, and urge incontinence. 2. Incomplete bladder emptying. 3. Kidney stones.  At last visit with Dr. Ronne Binning on 09/29/2021: - Reports increased urgency, urge incontinence, straining to urinate, weak / intermittent urinary stream.  - PVR = 348 ml. - The plan was Flomax 0.4 mg daily.  Since last visit: ***  ***sui & R flank pain ***RUS & KUB  Today: She {Actions; denies-reports:120008} stress incontinence with ***cough/***laugh/***sneeze/***heavy lifting /***exercise. She {Actions; denies-reports:120008} urge incontinence. She reports the ***SUI / ***UUI is predominant.  She leaks *** times per ***. Wears *** ***pads/ ***diapers per day. She reports urinary incontinence {ACTION; IS/IS [id number]} significantly bothersome.   She {Actions; denies-reports:120008} increased urinary urgency, frequency, nocturia, dysuria, gross hematuria, hesitancy, straining to void, or sensations of incomplete emptying.  She {Actions; denies-reports:120008} right flank pain.  She {Actions; denies-reports:120008} abdominal pain. She {Actions; denies-reports:120008} recent episode of stone pain/ passage.  She {Actions; denies-reports:120008} fevers/chills/sweats.  She {Actions; denies-reports:120008} nausea/vomiting.   Fall Screening: Do you usually have a device to assist in your mobility? {yes/no:20286} ***cane / ***walker / ***wheelchair   Medications: Current Outpatient Medications  Medication Sig Dispense Refill   alfuzosin (UROXATRAL) 10 MG 24 hr tablet Take 1 tablet (10 mg total) by mouth daily with breakfast. 30 tablet 5   ARIPiprazole (ABILIFY) 5 MG tablet Take 1 tablet (5 mg total) by mouth daily. 90 tablet 0   aspirin EC 81 MG tablet Take 81  mg by mouth daily. (Patient not taking: Reported on 04/20/2023)     cetirizine (ZYRTEC) 10 MG tablet cetirizine 10 mg tablet   10 mg by oral route.     DULoxetine (CYMBALTA) 60 MG capsule Take 1 capsule (60 mg total) by mouth daily. 90 capsule 0   fluconazole (DIFLUCAN) 150 MG tablet Take 1 now and 1 in 3 days (Patient not taking: Reported on 04/20/2023) 2 tablet 1   fluticasone (FLONASE) 50 MCG/ACT nasal spray Place 2 sprays into both nostrils daily as needed for allergies.      loratadine (CLARITIN) 10 MG tablet Take 10 mg by mouth daily.      LORazepam (ATIVAN) 1 MG tablet May take 1 tablet (1 mg total) by mouth daily as needed for anxiety. May also take 0.5 tablets (0.5 mg total) at bedtime as needed for anxiety. 45 tablet 1   naloxone (NARCAN) 4 MG/0.1ML LIQD nasal spray kit      Olopatadine HCl 0.2 % SOLN olopatadine 0.2 % eye drops (Patient not taking: Reported on 04/20/2023)     ondansetron (ZOFRAN) 8 MG tablet TAKE 1 TABLET BY MOUTH EVERY 8 HOURS AS NEEDED FOR NAUSEA AND VOMITING. 20 tablet 0   oxyCODONE-acetaminophen (PERCOCET) 10-325 MG tablet Take 1 tablet by mouth every 6 (six) hours as needed for pain.      pantoprazole (PROTONIX) 40 MG tablet Take 1 tablet (40 mg total) by mouth 2 (two) times daily before a meal. 60 tablet 0   predniSONE (DELTASONE) 10 MG tablet Take 10 mg by mouth 2 (two) times daily. (Patient not taking: Reported on 04/20/2023)     pregabalin (LYRICA) 150 MG capsule Take 150 mg by mouth 2 (two) times daily.     PROAIR HFA 108 (90 Base) MCG/ACT inhaler USE 2 PUFFS EVERY 6 HOURS  AS NEEDED FOR WHEEZING OR SHORTNESS OF BREATH. (Patient taking differently: Inhale 2 puffs into the lungs every 6 (six) hours as needed for wheezing or shortness of breath.) 8.5 g 0   promethazine (PHENERGAN) 25 MG tablet Take 25 mg by mouth every 6 (six) hours as needed for nausea or vomiting.      tamsulosin (FLOMAX) 0.4 MG CAPS capsule tamsulosin 0.4 mg capsule (Patient not taking: Reported on  04/20/2023)     Tetrahydrozoline HCl (VISINE OP) Place 1 drop into both eyes daily as needed (dry / allergy eyes). (Patient not taking: Reported on 04/20/2023)     tiZANidine (ZANAFLEX) 4 MG tablet Take 4 mg by mouth 3 (three) times daily as needed.     topiramate (TOPAMAX) 25 MG tablet Take 25 mg by mouth daily. (Patient not taking: Reported on 04/20/2023)     VALTOCO 10 MG DOSE 10 MG/0.1ML LIQD SMARTSIG:1 Spray(s) Both Nares Twice Daily PRN (Patient not taking: Reported on 04/20/2023)     No current facility-administered medications for this visit.    Allergies: Allergies  Allergen Reactions   Dicyclomine Palpitations    Patient states that her heart rate will go real fast when she takes this medication.   Other Other (See Comments)    House dust   Flagyl [Metronidazole Hcl] Nausea And Vomiting   Morphine Itching   Morphine And Codeine Other (See Comments)    Headache    Penicillins Other (See Comments)    Caused patient to pass out Did it involve swelling of the face/tongue/throat, SOB, or low BP? No Did it involve sudden or severe rash/hives, skin peeling, or any reaction on the inside of your mouth or nose? No Did you need to seek medical attention at a hospital or doctor's office? No When did it last happen?      20 + years If all above answers are "NO", may proceed with cephalosporin use.     Past Medical History:  Diagnosis Date   Anger    Anxiety    Aortic insufficiency 2006   Noted at heart cath - mild to moderate   Arthritis    Asthma    Back pain    BV (bacterial vaginosis) 05/28/2015   Chronic diarrhea    Chronic headache    Depression    Diarrhea 05/28/2015   Dyslipidemia 01/19/2016   Fibroids, submucosal 10/24/2018   GERD (gastroesophageal reflux disease)    History of kidney stones    history of   History of nuclear stress test 07/15/2008   lexiscan; mild-mod perfusion defect in mid anteroseptal, apical anterior, apical septal regions (attenuation artifiact),  prominent gut uptake activity in infero-apical region; post-stress EF 64%; EKG negative for ischemia; patient experienced CP during test   Hx of cardiac catheterization 2006   no significant CAD   IBS (irritable bowel syndrome)    Irritable bowel syndrome    Nausea 05/28/2015   Neck pain    Numerous moles 01/18/2016   RLQ abdominal pain 05/28/2015   Seizures (HCC)    2 years ago; unknown etiology and none since then and on no meds   Vaginal discharge 05/28/2015   Weight gain 01/18/2016   Past Surgical History:  Procedure Laterality Date   BIOPSY N/A 02/25/2014   Procedure: BIOPSY;  Surgeon: Malissa Hippo, MD;  Location: [facility];  Service: Endoscopy;  Laterality: N/A;   BREAST ENHANCEMENT SURGERY     CARDIAC CATHETERIZATION  1025//2006   no significant CAD, mild-mod  depressed LV systolic function, EF 35-40%, mild-mod AI (Dr. Laurell Josephs)    CARPAL TUNNEL RELEASE Bilateral 01/01/2016   CESAREAN SECTION     COLONOSCOPY  05/27/2011   snare polypectomy    COLONOSCOPY WITH PROPOFOL N/A 10/21/2016   Procedure: COLONOSCOPY WITH PROPOFOL;  Surgeon: Malissa Hippo, MD;  Location: [facility];  Service: Endoscopy;  Laterality: N/A;  10:30   COLONOSCOPY WITH PROPOFOL N/A 08/02/2019   Procedure: COLONOSCOPY WITH PROPOFOL;  Surgeon: Malissa Hippo, MD;  Location: [facility];  Service: Endoscopy;  Laterality: N/A;  7:30   ESOPHAGOGASTRODUODENOSCOPY N/A 02/25/2014   Procedure: ESOPHAGOGASTRODUODENOSCOPY (EGD);  Surgeon: Malissa Hippo, MD;  [facility]: [facility];  Service: Endoscopy;  Laterality: N/A;  730   ESOPHAGOGASTRODUODENOSCOPY (EGD) WITH PROPOFOL N/A 08/02/2019   Procedure: ESOPHAGOGASTRODUODENOSCOPY (EGD) WITH PROPOFOL;  Surgeon: Malissa Hippo, MD;  [facility]: [facility];  Service: Endoscopy;  Laterality: N/A;   KNEE SURGERY Right    MASS EXCISION Right 10/21/2020   Procedure: EXCISION CYST 2 CM, RIGHT AXILLA  AND EXCISION OF RIGHT FLANK SKIN LESION;  Surgeon: Lucretia Roers, MD;  Location: [facility];  Service: General;  Laterality: Right;   PARTIAL KNEE ARTHROPLASTY Right 03/07/2018   Procedure: Right knee medial unicompartmental arthroplasty;  Surgeon: Ollen Gross, MD;  Location: [facility];  Service: Orthopedics;  Laterality: Right;  with block   POLYPECTOMY  08/02/2019   Procedure: POLYPECTOMY;  Surgeon: Malissa Hippo, MD;  Location: [facility];  Service: Endoscopy;;  cold snare distal sigmoid   TRANSTHORACIC ECHOCARDIOGRAM  07/2008   LV systolic function is low-normal; RV is normal in size & function; mild MR, mild TR   TUBAL LIGATION     Family History  Problem Relation Age of Onset   Other Mother        heart problems   Dementia Father    Depression Sister    Anxiety disorder Sister    Anxiety disorder Sister    Depression Brother    Anxiety disorder Brother    Other Brother        neck and back surgery   Leukemia Maternal Grandfather    Other Maternal Grandmother        brain tumor   Social History   Socioeconomic History   Marital status: Single    Spouse name: Not on file   Number of children: 4   Years of education: 9   Highest education level: Not on file  Occupational History    Employer: [employer]  Tobacco Use   Smoking status: Every Day    Current packs/day: 0.50    Average packs/day: 0.5 packs/day for 36.7 years (18.3 ttl pk-yrs)    Types: Cigarettes    Start date: 10/11/1986   Smokeless tobacco: Never  Vaping Use   Vaping status: Former   Substances: Nicotine  Substance and Sexual Activity   Alcohol use: Not Currently    Alcohol/week: 1.0 standard drink of alcohol    Types: 1 Glasses of wine per week    Comment: occasionally    Drug use: Not Currently   Sexual activity: Yes    Birth control/protection: Surgical    Comment: tubal  Other Topics Concern   Not on file  Social History Narrative   Not on file   Social Determinants of Health   Financial Resource Strain: Medium Risk (04/20/2022)   Overall  Financial Resource Strain (CARDIA)    Difficulty of Paying Living Expenses:  Somewhat hard  Food Insecurity: Food Insecurity Present (04/20/2022)   Hunger Vital Sign    Worried About Running Out of Food in the Last Year: Sometimes true    Ran Out of Food in the Last Year: Sometimes true  Transportation Needs: Unmet Transportation Needs (04/20/2022)   PRAPARE - Transportation    Lack of Transportation (Medical): Yes    Lack of Transportation (Non-Medical): Yes  Physical Activity: Inactive (04/20/2022)   Exercise Vital Sign    Days of Exercise per Week: 0 days    Minutes of Exercise per Session: 0 min  Stress: Stress Concern Present (04/20/2022)   [employer] of Occupational Health - Occupational Stress Questionnaire    Feeling of Stress : Rather much  Social Connections: Socially Isolated (04/20/2022)   Social Connection and Isolation Panel [NHANES]    Frequency of Communication with Friends and Family: Twice a week    Frequency of Social Gatherings with Friends and Family: Never    Attends Religious Services: More than 4 times per year    Active Member of [employer] or Organizations: No    Attends Banker Meetings: Never    Marital Status: Divorced  Catering manager Violence: Not At Risk (04/20/2022)   Humiliation, Afraid, Rape, and Kick questionnaire    Fear of Current or Ex-Partner: No    Emotionally Abused: No    Physically Abused: No    Sexually Abused: No    Review of Systems Constitutional: Patient ***denies any unintentional weight loss or change in strength lntegumentary: Patient ***denies any rashes or pruritus Eyes: Patient denies ***dry eyes ENT: Patient ***denies dry mouth Cardiovascular: Patient ***denies chest pain or syncope Respiratory: Patient ***denies shortness of breath Gastrointestinal: Patient ***denies nausea, vomiting, constipation, or diarrhea Musculoskeletal: Patient ***denies muscle cramps or weakness Neurologic: Patient ***denies  convulsions or seizures Psychiatric: Patient ***denies memory problems Allergic/Immunologic: Patient ***denies recent allergic reaction(s) Hematologic/Lymphatic: Patient denies bleeding tendencies Endocrine: Patient ***denies heat/cold intolerance  GU: As per HPI.  OBJECTIVE There were no vitals filed for this visit. There is no height or weight on file to calculate BMI.  Physical Examination  Constitutional: ***No obvious distress; patient is ***non-toxic appearing  Cardiovascular: ***No visible lower extremity edema.  Respiratory: The patient does ***not have audible wheezing/stridor; respirations do ***not appear labored  Gastrointestinal: Abdomen ***non-distended Musculoskeletal: ***Normal ROM of UEs  Skin: ***No obvious rashes/open sores  Neurologic: CN 2-12 grossly ***intact Psychiatric: Answered questions ***appropriately with ***normal affect  Hematologic/Lymphatic/Immunologic: ***No obvious bruises or sites of spontaneous bleeding  UA: {Desc; negative/positive:13464} for *** WBC/hpf, *** RBC/hpf, bacteria (***) PVR: *** ml  ASSESSMENT No diagnosis found. ***  Will plan for follow up in *** months / ***1 year or sooner if needed. Pt verbalized understanding and agreement. All questions were answered.  PLAN Advised the following: 1. *** 2. ***No follow-ups on file.  No orders of the defined types were placed in this encounter.   It has been explained that the patient is to follow regularly with their PCP in addition to all other providers involved in their care and to follow instructions provided by these respective offices. Patient advised to contact urology clinic if any urologic-pertaining questions, concerns, new symptoms or problems arise in the interim period.  There are no Patient Instructions on file for this visit.  Electronically signed by:  Donnita Falls, FNP   06/22/23    8:36 AM

## 2023-06-27 ENCOUNTER — Ambulatory Visit (INDEPENDENT_AMBULATORY_CARE_PROVIDER_SITE_OTHER): Payer: Medicaid Other | Admitting: Psychiatry

## 2023-06-27 ENCOUNTER — Telehealth: Payer: Self-pay | Admitting: Psychiatry

## 2023-06-27 DIAGNOSIS — F4310 Post-traumatic stress disorder, unspecified: Secondary | ICD-10-CM | POA: Diagnosis not present

## 2023-06-27 DIAGNOSIS — F330 Major depressive disorder, recurrent, mild: Secondary | ICD-10-CM | POA: Diagnosis not present
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
## 2023-06-27 NOTE — Telephone Encounter (Signed)
Provider working remote. Trying to reschedule appointment. 06/19/23 mailbox full sent [employer]

## 2023-07-03 NOTE — Progress Notes (Signed)
Please let pt know KUB was normal - no GU stones visualized. Thanks.

## 2023-07-11 ENCOUNTER — Ambulatory Visit (HOSPITAL_COMMUNITY): Payer: Medicaid Other | Admitting: Psychiatry

## 2023-07-11 ENCOUNTER — Ambulatory Visit: Payer: Medicaid Other | Admitting: Psychiatry

## 2023-07-11 ENCOUNTER — Telehealth (HOSPITAL_COMMUNITY): Payer: Self-pay | Admitting: Psychiatry

## 2023-07-11 ENCOUNTER — Encounter (HOSPITAL_COMMUNITY): Payer: Self-pay

## 2023-07-11 NOTE — Telephone Encounter (Signed)
Therapist attempted to contact patient via text through care agility platform for scheduled appointment, no response.  Therapist called patient who reported schedule conflict due to her work schedule.  Patient canceled appointment and will call to reschedule.

## 2023-07-18 ENCOUNTER — Ambulatory Visit: Payer: Medicaid Other | Admitting: Urology

## 2023-07-25 NOTE — Progress Notes (Unsigned)
Name: Julie [name] DOB: March 20, 1971 MRN: [id number]  History of Present Illness: Ms. Julie [name] is a 52 y.o. female who presents today for follow up visit at [facility]. - GU history: 1. OAB with urinary frequency, urgency, and urge incontinence. 2. Incomplete bladder emptying. 3. Kidney stones.  At last visit with Dr. Ronne Binning on 09/29/2021: - Reports increased urgency, urge incontinence, straining to urinate, weak / intermittent urinary stream.  - PVR = 348 ml. - The plan was Flomax 0.4 mg daily.  Since last visit: 06/22/2023: KUB was normal - no GU stones visualized.   Today: She reports that over the past 2 year her urinary symptoms have progressively worsened including urinary frequency, nocturia, urgency, and urge incontinence. She voids approximately 6-8x/day and 2x/night. She also reports stress urinary incontinence with cough/laugh/sneeze.   She reports the SUI is predominant.  She leaks almost daily; wears pads as needed.  Denies dysuria, gross hematuria, hesitancy, straining to void, or sensations of incomplete emptying. Reports intermittent right flank pain which is described as moderate in severity when present.    Fall Screening: Do you usually have a device to assist in your mobility? No   Medications: Current Outpatient Medications  Medication Sig Dispense Refill   ARIPiprazole (ABILIFY) 5 MG tablet Take 1 tablet (5 mg total) by mouth daily. 90 tablet 0   cetirizine (ZYRTEC) 10 MG tablet cetirizine 10 mg tablet   10 mg by oral route.     DULoxetine (CYMBALTA) 60 MG capsule Take 1 capsule (60 mg total) by mouth daily. 90 capsule 0   fluticasone (FLONASE) 50 MCG/ACT nasal spray Place 2 sprays into both nostrils daily as needed for allergies.      loratadine (CLARITIN) 10 MG tablet Take 10 mg by mouth daily.      mirabegron ER (MYRBETRIQ) 25 MG TB24 tablet Take 1 tablet (25 mg total) by mouth daily. 30 tablet 11   naloxone (NARCAN) 4 MG/0.1ML  LIQD nasal spray kit      Olopatadine HCl 0.2 % SOLN      ondansetron (ZOFRAN) 8 MG tablet TAKE 1 TABLET BY MOUTH EVERY 8 HOURS AS NEEDED FOR NAUSEA AND VOMITING. 20 tablet 0   oxyCODONE-acetaminophen (PERCOCET) 10-325 MG tablet Take 1 tablet by mouth every 6 (six) hours as needed for pain.      pantoprazole (PROTONIX) 40 MG tablet Take 1 tablet (40 mg total) by mouth 2 (two) times daily before a meal. 60 tablet 0   pregabalin (LYRICA) 150 MG capsule Take 150 mg by mouth 2 (two) times daily.     PROAIR HFA 108 (90 Base) MCG/ACT inhaler USE 2 PUFFS EVERY 6 HOURS AS NEEDED FOR WHEEZING OR SHORTNESS OF BREATH. (Patient taking differently: Inhale 2 puffs into the lungs every 6 (six) hours as needed for wheezing or shortness of breath.) 8.5 g 0   promethazine (PHENERGAN) 25 MG tablet Take 25 mg by mouth every 6 (six) hours as needed for nausea or vomiting.      tamsulosin (FLOMAX) 0.4 MG CAPS capsule Take 1 capsule (0.4 mg total) by mouth daily for 14 days. 14 capsule 0   tiZANidine (ZANAFLEX) 4 MG tablet Take 4 mg by mouth 3 (three) times daily as needed.     No current facility-administered medications for this visit.    Allergies: Allergies  Allergen Reactions   Dicyclomine Palpitations    Patient states that her heart rate will go real fast when she takes this medication.  Other Other (See Comments)    House dust   Flagyl [Metronidazole Hcl] Nausea And Vomiting   Morphine Itching   Morphine And Codeine Other (See Comments)    Headache    Penicillins Other (See Comments)    Caused patient to pass out Did it involve swelling of the face/tongue/throat, SOB, or low BP? No Did it involve sudden or severe rash/hives, skin peeling, or any reaction on the inside of your mouth or nose? No Did you need to seek medical attention at a hospital or doctor's office? No When did it last happen?      20 + years If all above answers are "NO", may proceed with cephalosporin use.     Past Medical  History:  Diagnosis Date   Anger    Anxiety    Aortic insufficiency 2006   Noted at heart cath - mild to moderate   Arthritis    Asthma    Back pain    BV (bacterial vaginosis) 05/28/2015   Chronic diarrhea    Chronic headache    Depression    Diarrhea 05/28/2015   Dyslipidemia 01/19/2016   Fibroids, submucosal 12/22/2017   GERD (gastroesophageal reflux disease)    History of kidney stones    history of   History of nuclear stress test 07/15/2008   lexiscan; mild-mod perfusion defect in mid anteroseptal, apical anterior, apical septal regions (attenuation artifiact), prominent gut uptake activity in infero-apical region; post-stress EF 64%; EKG negative for ischemia; patient experienced CP during test   Hx of cardiac catheterization 2006   no significant CAD   IBS (irritable bowel syndrome)    Irritable bowel syndrome    Nausea 05/28/2015   Neck pain    Numerous moles 01/18/2016   RLQ abdominal pain 05/28/2015   Seizures (HCC)    2 years ago; unknown etiology and none since then and on no meds   Vaginal discharge 05/28/2015   Weight gain 01/18/2016   Past Surgical History:  Procedure Laterality Date   BIOPSY N/A 02/25/2014   Procedure: BIOPSY;  Surgeon: Malissa Hippo, MD;  Location: [facility];  Service: Endoscopy;  Laterality: N/A;   BREAST ENHANCEMENT SURGERY     CARDIAC CATHETERIZATION  1025//2006   no significant CAD, mild-mod depressed LV systolic function, EF 35-40%, mild-mod AI (Dr. Laurell Josephs)    CARPAL TUNNEL RELEASE Bilateral 01/01/2016   CESAREAN SECTION     COLONOSCOPY  05/27/2011   snare polypectomy    COLONOSCOPY WITH PROPOFOL N/A 10/21/2016   Procedure: COLONOSCOPY WITH PROPOFOL;  Surgeon: Malissa Hippo, MD;  Location: [facility];  Service: Endoscopy;  Laterality: N/A;  10:30   COLONOSCOPY WITH PROPOFOL N/A 08/02/2019   Procedure: COLONOSCOPY WITH PROPOFOL;  Surgeon: Malissa Hippo, MD;  Location: [facility];  Service: Endoscopy;  Laterality: N/A;   7:30   ESOPHAGOGASTRODUODENOSCOPY N/A 02/25/2014   Procedure: ESOPHAGOGASTRODUODENOSCOPY (EGD);  Surgeon: Malissa Hippo, MD;  Location: [facility];  Service: Endoscopy;  Laterality: N/A;  730   ESOPHAGOGASTRODUODENOSCOPY (EGD) WITH PROPOFOL N/A 08/02/2019   Procedure: ESOPHAGOGASTRODUODENOSCOPY (EGD) WITH PROPOFOL;  Surgeon: Malissa Hippo, MD;  Location: [facility];  Service: Endoscopy;  Laterality: N/A;   KNEE SURGERY Right    MASS EXCISION Right 10/21/2020   Procedure: EXCISION CYST 2 CM, RIGHT AXILLA  AND EXCISION OF RIGHT FLANK SKIN LESION;  Surgeon: Lucretia Roers, MD;  Location: [facility];  Service: General;  Laterality: Right;   PARTIAL KNEE ARTHROPLASTY  Right 03/07/2018   Procedure: Right knee medial unicompartmental arthroplasty;  Surgeon: Ollen Gross, MD;  Location: [facility];  Service: Orthopedics;  Laterality: Right;  with block   POLYPECTOMY  08/02/2019   Procedure: POLYPECTOMY;  Surgeon: Malissa Hippo, MD;  Location: [facility];  Service: Endoscopy;;  cold snare distal sigmoid   TRANSTHORACIC ECHOCARDIOGRAM  07/2008   LV systolic function is low-normal; RV is normal in size & function; mild MR, mild TR   TUBAL LIGATION     Family History  Problem Relation Age of Onset   Other Mother        heart problems   Dementia Father    Depression Sister    Anxiety disorder Sister    Anxiety disorder Sister    Depression Brother    Anxiety disorder Brother    Other Brother        neck and back surgery   Leukemia Maternal Grandfather    Other Maternal Grandmother        brain tumor   Social History   Socioeconomic History   Marital status: Single    Spouse name: Not on file   Number of children: 4   Years of education: 9   Highest education level: Not on file  Occupational History    Employer: [employer]  Tobacco Use   Smoking status: Every Day    Current packs/day: 0.50    Average packs/day: 0.5 packs/day for 36.8 years (18.4 ttl pk-yrs)    Types:  Cigarettes    Start date: 10/11/1986   Smokeless tobacco: Never  Vaping Use   Vaping status: Former   Substances: Nicotine  Substance and Sexual Activity   Alcohol use: Not Currently    Alcohol/week: 1.0 standard drink of alcohol    Types: 1 Glasses of wine per week    Comment: occasionally    Drug use: Not Currently   Sexual activity: Yes    Birth control/protection: Surgical    Comment: tubal  Other Topics Concern   Not on file  Social History Narrative   Not on file   Social Determinants of Health   Financial Resource Strain: Medium Risk (04/20/2022)   Overall Financial Resource Strain (CARDIA)    Difficulty of Paying Living Expenses: Somewhat hard  Food Insecurity: Food Insecurity Present (04/20/2022)   Hunger Vital Sign    Worried About Running Out of Food in the Last Year: Sometimes true    Ran Out of Food in the Last Year: Sometimes true  Transportation Needs: Unmet Transportation Needs (04/20/2022)   PRAPARE - Transportation    Lack of Transportation (Medical): Yes    Lack of Transportation (Non-Medical): Yes  Physical Activity: Inactive (04/20/2022)   Exercise Vital Sign    Days of Exercise per Week: 0 days    Minutes of Exercise per Session: 0 min  Stress: Stress Concern Present (04/20/2022)   [employer] of Occupational Health - Occupational Stress Questionnaire    Feeling of Stress : Rather much  Social Connections: Socially Isolated (04/20/2022)   Social Connection and Isolation Panel [NHANES]    Frequency of Communication with Friends and Family: Twice a week    Frequency of Social Gatherings with Friends and Family: Never    Attends Religious Services: More than 4 times per year    Active Member of [employer] or Organizations: No    Attends Banker Meetings: Never    Marital Status: Divorced  Catering manager Violence: Not At Risk (04/20/2022)  Humiliation, Afraid, Rape, and Kick questionnaire    Fear of Current or Ex-Partner: No    Emotionally  Abused: No    Physically Abused: No    Sexually Abused: No    Review of Systems Constitutional: Patient denies any unintentional weight loss or change in strength lntegumentary: Patient denies any rashes or pruritus Eyes: Patient reports dry eyes ENT: Patient reports dry mouth Cardiovascular: Patient denies chest pain or syncope Respiratory: Patient denies shortness of breath Gastrointestinal: Patient denies nausea, vomiting, or diarrhea; reports chronic constipation Musculoskeletal: Patient denies muscle cramps or weakness Neurologic: Patient reports history of seizures Psychiatric: Patient denies memory problems Allergic/Immunologic: Patient denies recent allergic reaction(s) Hematologic/Lymphatic: Patient denies bleeding tendencies Endocrine: Patient denies heat/cold intolerance  GU: As per HPI.  OBJECTIVE Vitals:   07/26/23 1140  BP: 127/72  Pulse: 84  Temp: 98.1 F (36.7 C)   There is no height or weight on file to calculate BMI.  Physical Examination Constitutional: No obvious distress; patient is non-toxic appearing  Cardiovascular: No visible lower extremity edema.  Respiratory: The patient does not have audible wheezing/stridor; respirations do not appear labored  Gastrointestinal: Abdomen non-distended Musculoskeletal: Normal ROM of UEs  Skin: No obvious rashes/open sores  Neurologic: CN 2-12 grossly intact Psychiatric: Answered questions appropriately with normal affect  Hematologic/Lymphatic/Immunologic: No obvious bruises or sites of spontaneous bleeding  UA: pending  PVR: 36 ml  ASSESSMENT Urge incontinence of urine - Plan: Urinalysis, Routine w reflex microscopic, BLADDER SCAN AMB NON-IMAGING, mirabegron ER (MYRBETRIQ) 25 MG TB24 tablet  Stress incontinence, female - Plan: Urinalysis, Routine w reflex microscopic, BLADDER SCAN AMB NON-IMAGING, Ambulatory referral to Urology  History of kidney stones - Plan: Urinalysis, Routine w reflex microscopic,  BLADDER SCAN AMB NON-IMAGING, CT RENAL STONE STUDY, tamsulosin (FLOMAX) 0.4 MG CAPS capsule  Incomplete bladder emptying - Plan: Urinalysis, Routine w reflex microscopic, BLADDER SCAN AMB NON-IMAGING  Right lower quadrant abdominal pain - Plan: CT RENAL STONE STUDY, tamsulosin (FLOMAX) 0.4 MG CAPS capsule  RLQ abdominal pain - Plan: CT RENAL STONE STUDY, tamsulosin (FLOMAX) 0.4 MG CAPS capsule  Right flank pain - Plan: CT RENAL STONE STUDY, tamsulosin (FLOMAX) 0.4 MG CAPS capsule  Urinary urgency - Plan: mirabegron ER (MYRBETRIQ) 25 MG TB24 tablet  Urinary frequency - Plan: mirabegron ER (MYRBETRIQ) 25 MG TB24 tablet  OAB (overactive bladder) - Plan: mirabegron ER (MYRBETRIQ) 25 MG TB24 tablet  Dry mouth  Dry eyes  Constipation, unspecified constipation type  Seizure disorder (HCC)  Caffeine use  1. OAB with urinary frequency, urgency, and urge incontinence.  We discussed the symptoms of overactive bladder (OAB), which include urinary urgency, frequency, nocturia, with or without urge incontinence.   While we may not know the exact etiology of OAB, several risk factors can be identified.  - We discussed this patient's neurogenic risk factors for OAB-type symptoms including back problems and nicotine use.  - Likely exacerbated by caffeine intake.   We discussed the following management options in detail including potential benefits, risks, and side effects: Behavioral therapy: Modify fluid intake Decreasing bladder irritants (such as caffeine, acidic foods, spicy foods, alcohol) Urge suppression strategies Bladder retraining / timed voiding Double voiding Medication(s): - For anticholinergic medications, we discussed the potential side effects of anticholinergics including dry eyes, dry mouth, constipation, cognitive impairment and urinary retention. She is not a good candidate for anticholinergics due to pre-existing constipation, dry mouth, and dry eyes.  - For beta-3  agonist medication, we discussed the risk for urinary retention  and the potential side effect of elevated blood pressure specific to Myrbetriq (which is more likely to occur in individuals with uncontrolled hypertension).   She decided to proceed with Myrbetriq 25 mg dialy and to work on behavioral modifications including minimizing caffeine intake.  2. Stress urinary incontinence.  The etiology of this condition was explained in detail to include pelvic floor muscle relaxation and detachment of the urethra away from its connection to the pubic bone. Her risk profile was reviewed, including childbirth.  The management options were reviewed to include: No intervention, observation. Non-surgical options: Pelvic floor muscle rehabilitation Suppression strategies Weight loss Incontinence pessary Surgical consultation: Options may include a midurethral sling (with synthetic mesh implant or autologous fascial sling), a Burch urethropexy, or transurethral injection of a bulking agent. Detailed discussion was had regarding the potential risks of both procedures, including: mesh exposure, mesh erosion, failure to resolve SUI, ineffectiveness of these procedures for urge UI, dyspareunia, voiding dysfunction and/or retention.  She elected to proceed with referral to [facility] for surgical consultation.  3. Incomplete bladder emptying. Resolved. PVR significantly improved compared to prior.   4. Right flank pain with history of kidney stones. We agreed to proceed with CT stone for further evaluation to evaluate for possible radiolucent stone since KUB was negative and right flank pain has persisted. We agreed to start Flomax 0.4 mg daily for the next 2 weeks for medical expulsive therapy.   Will plan for follow up here in 8 weeks for OAB symptom check. Pt verbalized understanding and agreement. All questions were answered.  PLAN Advised the following: 1. Referral placed to [facility] for  surgical consultation for stress urinary incontinence.  2. Minimize caffeine intake. 3. Start Myrbetriq 25 mg daily. 4. CT stone. 5. Start Flomax 0.4 mg daily. 5. Return in about 8 weeks (around 09/20/2023) for UA, PVR, & f/u with Evette Georges NP.  Orders Placed This Encounter  Procedures   CT RENAL STONE STUDY    Standing Status:   Future    Standing Expiration Date:   07/25/2024    Order Specific Question:   Is patient pregnant?    Answer:   No    Order Specific Question:   Preferred imaging location?    Answer:   [facility]   Urinalysis, Routine w reflex microscopic   Ambulatory referral to Urology    Referral Priority:   Routine    Referral Type:   Consultation    Referral Reason:   Specialty Services Required    Requested Specialty:   Urology    Number of Visits Requested:   1   BLADDER SCAN AMB NON-IMAGING    It has been explained that the patient is to follow regularly with their PCP in addition to all other providers involved in their care and to follow instructions provided by these respective offices. Patient advised to contact urology clinic if any urologic-pertaining questions, concerns, new symptoms or problems arise in the interim period.  There are no Patient Instructions on file for this visit.  Electronically signed by:  Donnita Falls, FNP   07/26/23    12:40 PM

## 2023-07-26 ENCOUNTER — Ambulatory Visit: Payer: Medicaid Other | Admitting: Urology

## 2023-07-26 ENCOUNTER — Encounter: Payer: Self-pay | Admitting: Urology

## 2023-07-26 VITALS — BP 127/72 | HR 84 | Temp 98.1°F

## 2023-07-26 DIAGNOSIS — K5900 Constipation, unspecified: Secondary | ICD-10-CM

## 2023-07-26 DIAGNOSIS — N3946 Mixed incontinence: Secondary | ICD-10-CM

## 2023-07-26 DIAGNOSIS — R682 Dry mouth, unspecified: Secondary | ICD-10-CM

## 2023-07-26 DIAGNOSIS — G40909 Epilepsy, unspecified, not intractable, without status epilepticus: Secondary | ICD-10-CM

## 2023-07-26 DIAGNOSIS — R1031 Right lower quadrant pain: Secondary | ICD-10-CM | POA: Diagnosis not present

## 2023-07-26 DIAGNOSIS — R339 Retention of urine, unspecified: Secondary | ICD-10-CM

## 2023-07-26 DIAGNOSIS — R3915 Urgency of urination: Secondary | ICD-10-CM

## 2023-07-26 DIAGNOSIS — N3281 Overactive bladder: Secondary | ICD-10-CM

## 2023-07-26 DIAGNOSIS — R109 Unspecified abdominal pain: Secondary | ICD-10-CM

## 2023-07-26 DIAGNOSIS — Z789 Other specified health status: Secondary | ICD-10-CM

## 2023-07-26 DIAGNOSIS — N3941 Urge incontinence: Secondary | ICD-10-CM

## 2023-07-26 DIAGNOSIS — Z87442 Personal history of urinary calculi: Secondary | ICD-10-CM

## 2023-07-26 DIAGNOSIS — N393 Stress incontinence (female) (male): Secondary | ICD-10-CM

## 2023-07-26 DIAGNOSIS — H04123 Dry eye syndrome of bilateral lacrimal glands: Secondary | ICD-10-CM

## 2023-07-26 DIAGNOSIS — R350 Frequency of micturition: Secondary | ICD-10-CM

## 2023-07-26 LAB — URINALYSIS, ROUTINE W REFLEX MICROSCOPIC
Bilirubin, UA: NEGATIVE
Glucose, UA: NEGATIVE
Ketones, UA: NEGATIVE
Leukocytes,UA: NEGATIVE
Nitrite, UA: NEGATIVE
Protein,UA: NEGATIVE
Specific Gravity, UA: 1.03 (ref 1.005–1.030)
Urobilinogen, Ur: 1 mg/dL (ref 0.2–1.0)
pH, UA: 6 (ref 5.0–7.5)

## 2023-07-26 LAB — MICROSCOPIC EXAMINATION: Bacteria, UA: NONE SEEN

## 2023-07-26 LAB — BLADDER SCAN AMB NON-IMAGING: Scan Result: 0

## 2023-07-26 MED ORDER — MIRABEGRON ER 25 MG PO TB24
25.0000 mg | ORAL_TABLET | Freq: Every day | ORAL | 11 refills | Status: AC
Start: 2023-07-26 — End: ?

## 2023-07-26 MED ORDER — TAMSULOSIN HCL 0.4 MG PO CAPS
0.4000 mg | ORAL_CAPSULE | Freq: Every day | ORAL | 0 refills | Status: AC
Start: 2023-07-26 — End: 2023-08-09

## 2023-07-26 NOTE — Progress Notes (Deleted)
Cardiology Office Note    Date:  07/26/2023  ID:  Jaedyn, Leight [birth year], MRN [id number] PCP:  Ignatius Specking, MD  Cardiologist:  Donato Schultz, MD  Electrophysiologist:  None   Chief Complaint: ***  History of Present Illness: .    Julie [name] is a 52 y.o. female with visit-pertinent history of anxiety, GERD, NICM, aortic insufficiency by cath 2006 not seen later, mild MR, palpitations, arthritis, PTSD, depression, dyslipidemia, IBS, seizures seen for overdue follow-up.  Per chart review, had remote cath in 2006 for decreased EF (?45-50% on echo with decreased sensitivity due to breast implants) which showed no significant CAD, EF 35-40% by LV gram, mild-moderate AI. Per notes, it seems that there was some concern that she might have had either transient coronary spasm or perhaps a Takatsubo cardiomyopathy. She was later followed by several providers in our office for chest pain and palpitations. She underwent several monitors with overall benign findings - 10/2015 NSR (triggers corresponding to sinus), 09/2021 NSR with rare ectopy (triggers corresponding to NSR or mild sinus tach), 10/2021 NSR avg HR 100bpm with rare ectopy and one episode of SVT. Last echo 11/2021 EF 55-60%, G1DD, mild MR.  Mg level Echo 11/2024 Borderline sinus tach Inappropirrate?  NICM Palpitations with rare PACs, PVCs, PSVT Mild mitral regurgitation Prior aortic insufficiency  Labwork independently reviewed: [facility] DXA 01/2023 Hgb 11.5, plt 360, CMET OK except AP 133 - K 4.4, Cr 0.89, TSH wnl 2021 trig 173, LDL 102  ROS: .    Please see the history of present illness. Otherwise, review of systems is positive for ***.  All other systems are reviewed and otherwise negative.  Studies Reviewed: Marland Kitchen    EKG:  EKG is ordered today, personally reviewed, demonstrating ***  CV Studies: Cardiac studies reviewed are outlined and summarized above. Otherwise please see EMR for full report.   Current  Reported Medications:.    No outpatient medications have been marked as taking for the 07/27/23 encounter (Appointment) with Laurann Montana, PA-C.    Physical Exam:    VS:  LMP  (LMP Unknown)    Wt Readings from Last 3 Encounters:  04/20/22 130 lb (59 kg)  09/29/21 131 lb 6.4 oz (59.6 kg)  09/29/21 131 lb 2 oz (59.5 kg)    GEN: Well nourished, well developed in no acute distress NECK: No JVD; No carotid bruits CARDIAC: ***RRR, no murmurs, rubs, gallops RESPIRATORY:  Clear to auscultation without rales, wheezing or rhonchi  ABDOMEN: Soft, non-tender, non-distended EXTREMITIES:  No edema; No acute deformity   Asessement and Plan:.     ***     Disposition: F/u with ***  Signed, Laurann Montana, PA-C

## 2023-07-27 ENCOUNTER — Ambulatory Visit: Payer: Medicaid Other | Attending: Physician Assistant | Admitting: Physician Assistant

## 2023-07-27 DIAGNOSIS — I428 Other cardiomyopathies: Secondary | ICD-10-CM

## 2023-07-27 DIAGNOSIS — I351 Nonrheumatic aortic (valve) insufficiency: Secondary | ICD-10-CM

## 2023-07-27 DIAGNOSIS — I340 Nonrheumatic mitral (valve) insufficiency: Secondary | ICD-10-CM

## 2023-07-27 DIAGNOSIS — R002 Palpitations: Secondary | ICD-10-CM

## 2023-08-04 ENCOUNTER — Other Ambulatory Visit: Payer: Self-pay | Admitting: Psychiatry

## 2023-08-05 NOTE — Telephone Encounter (Signed)
 The refill has been ordered as requested. Please contact the patient to schedule a follow-up visit.

## 2023-08-08 NOTE — Telephone Encounter (Signed)
Unable to reach by phone number in Epic. Mychart message sent to call the office

## 2023-08-15 ENCOUNTER — Ambulatory Visit (HOSPITAL_COMMUNITY): Payer: Medicaid Other

## 2023-08-15 ENCOUNTER — Encounter (HOSPITAL_COMMUNITY): Payer: Self-pay

## 2023-09-12 ENCOUNTER — Telehealth: Payer: Self-pay

## 2023-09-12 DIAGNOSIS — N393 Stress incontinence (female) (male): Secondary | ICD-10-CM | POA: Insufficient documentation

## 2023-09-12 DIAGNOSIS — N3281 Overactive bladder: Secondary | ICD-10-CM | POA: Insufficient documentation

## 2023-09-12 DIAGNOSIS — N200 Calculus of kidney: Secondary | ICD-10-CM | POA: Insufficient documentation

## 2023-09-12 NOTE — Telephone Encounter (Signed)
-----   Message from Donnita Falls sent at 09/12/2023  1:48 PM EDT ----- At last OV on 07/26/23 patient was advised to get CT stone - not yet done. Please advise patient to schedule that prior to her f/u appt next week (09/19/23). Thanks.

## 2023-09-12 NOTE — Progress Notes (Deleted)
Name: Julie [name] DOB: [birth year] MRN: [id number]  History of Present Illness: Julie [name] is a 52 y.o. female who presents today for follow up visit at [facility] [place]. - GU history: 1. OAB. 2. Mixed urinary incontinence (stress predominant). 3. Kidney stones. - 06/22/2023: KUB was normal - no GU stones visualized.  4. Prior incomplete bladder emptying.  At last visit on 07/26/2023: - PVR = 36 ml. Much improved compared to prior (was 348 ml on 09/29/2021). - Reported stress-predominant MUI, OAB, and persistent right flank pain. - The plan was: 1. For stress urinary incontinence: Referred to [facility] for surgical consultation.  2. For OAB / UUI: Start Myrbetriq 25 mg daily & minimize caffeine intake. 4. For persistent right flank pain with history of stones but recent negative KUB: CT stone to evaluate for possible radiolucent stone. Flomax 0.4 mg daily for MET.  Since last visit: ***  ***was she seen for SUI surgical consultation at [facility] yet?  Today: She {Actions; denies-reports:120008} symptomatic improvement since starting Myrbetriq (Mirabegron) 25 mg daily.  She reports {Blank multiple:19197::"improved","persistent / unchanged"} urinary ***frequency, ***nocturia, ***urgency, and ***urge incontinence. Voiding ***x/day and ***x/night on average. Leaking ***x/day on average; using *** ***pads / ***diapers per day on average.  She {Actions; denies-reports:120008} dysuria, gross hematuria, straining to void, or sensations of incomplete emptying.  She {Actions; denies-reports:120008} flank pain or abdominal pain.   Fall Screening: Do you usually have a device to assist in your mobility? {yes/no:20286} ***cane / ***walker / ***wheelchair   Medications: Current Outpatient Medications  Medication Sig Dispense Refill   ARIPiprazole (ABILIFY) 5 MG tablet Take 1 tablet (5 mg total) by mouth daily. 90 tablet 0   cetirizine (ZYRTEC) 10 MG  tablet cetirizine 10 mg tablet   10 mg by oral route.     DULoxetine (CYMBALTA) 60 MG capsule Take 1 capsule (60 mg total) by mouth daily. 90 capsule 0   fluticasone (FLONASE) 50 MCG/ACT nasal spray Place 2 sprays into both nostrils daily as needed for allergies.      loratadine (CLARITIN) 10 MG tablet Take 10 mg by mouth daily.      mirabegron ER (MYRBETRIQ) 25 MG TB24 tablet Take 1 tablet (25 mg total) by mouth daily. 30 tablet 11   naloxone (NARCAN) 4 MG/0.1ML LIQD nasal spray kit      Olopatadine HCl 0.2 % SOLN      ondansetron (ZOFRAN) 8 MG tablet TAKE 1 TABLET BY MOUTH EVERY 8 HOURS AS NEEDED FOR NAUSEA AND VOMITING. 20 tablet 0   oxyCODONE-acetaminophen (PERCOCET) 10-325 MG tablet Take 1 tablet by mouth every 6 (six) hours as needed for pain.      pantoprazole (PROTONIX) 40 MG tablet Take 1 tablet (40 mg total) by mouth 2 (two) times daily before a meal. 60 tablet 0   pregabalin (LYRICA) 150 MG capsule Take 150 mg by mouth 2 (two) times daily.     PROAIR HFA 108 (90 Base) MCG/ACT inhaler USE 2 PUFFS EVERY 6 HOURS AS NEEDED FOR WHEEZING OR SHORTNESS OF BREATH. (Patient taking differently: Inhale 2 puffs into the lungs every 6 (six) hours as needed for wheezing or shortness of breath.) 8.5 g 0   promethazine (PHENERGAN) 25 MG tablet Take 25 mg by mouth every 6 (six) hours as needed for nausea or vomiting.      tiZANidine (ZANAFLEX) 4 MG tablet Take 4 mg by mouth 3 (three) times daily as needed.     No current  facility-administered medications for this visit.    Allergies: Allergies  Allergen Reactions   Dicyclomine Palpitations    Patient states that her heart rate will go real fast when she takes this medication.   Other Other (See Comments)    House dust   Flagyl [Metronidazole Hcl] Nausea And Vomiting   Morphine Itching   Morphine And Codeine Other (See Comments)    Headache    Penicillins Other (See Comments)    Caused patient to pass out Did it involve swelling of the  face/tongue/throat, SOB, or low BP? No Did it involve sudden or severe rash/hives, skin peeling, or any reaction on the inside of your mouth or nose? No Did you need to seek medical attention at a hospital or doctor's office? No When did it last happen?      20 + years If all above answers are "NO", may proceed with cephalosporin use.     Past Medical History:  Diagnosis Date   Anger    Anxiety    Aortic insufficiency 2006   Noted at heart cath - mild to moderate   Arthritis    Asthma    Back pain    BV (bacterial vaginosis) 05/28/2015   Chronic diarrhea    Chronic headache    Depression    Diarrhea 05/28/2015   Dyslipidemia 01/19/2016   Fibroids, submucosal 12/22/2017   GERD (gastroesophageal reflux disease)    History of kidney stones    history of   History of nuclear stress test 07/15/2008   lexiscan; mild-mod perfusion defect in mid anteroseptal, apical anterior, apical septal regions (attenuation artifiact), prominent gut uptake activity in infero-apical region; post-stress EF 64%; EKG negative for ischemia; patient experienced CP during test   Hx of cardiac catheterization 2006   no significant CAD   IBS (irritable bowel syndrome)    Irritable bowel syndrome    Nausea 05/28/2015   Neck pain    Numerous moles 01/18/2016   RLQ abdominal pain 05/28/2015   Seizures (HCC)    2 years ago; unknown etiology and none since then and on no meds   Vaginal discharge 05/28/2015   Weight gain 01/18/2016   Past Surgical History:  Procedure Laterality Date   BIOPSY N/A 02/25/2014   Procedure: BIOPSY;  Surgeon: Malissa Hippo, MD;  Location: [facility];  Service: Endoscopy;  Laterality: N/A;   BREAST ENHANCEMENT SURGERY     CARDIAC CATHETERIZATION  1025//2006   no significant CAD, mild-mod depressed LV systolic function, EF 35-40%, mild-mod AI (Dr. Laurell Josephs)    CARPAL TUNNEL RELEASE Bilateral 01/01/2016   CESAREAN SECTION     COLONOSCOPY  05/27/2011   snare polypectomy     COLONOSCOPY WITH PROPOFOL N/A 10/21/2016   Procedure: COLONOSCOPY WITH PROPOFOL;  Surgeon: Malissa Hippo, MD;  Location: [facility];  Service: Endoscopy;  Laterality: N/A;  10:30   COLONOSCOPY WITH PROPOFOL N/A 08/02/2019   Procedure: COLONOSCOPY WITH PROPOFOL;  Surgeon: Malissa Hippo, MD;  Location: [facility];  Service: Endoscopy;  Laterality: N/A;  7:30   ESOPHAGOGASTRODUODENOSCOPY N/A 02/25/2014   Procedure: ESOPHAGOGASTRODUODENOSCOPY (EGD);  Surgeon: Malissa Hippo, MD;  Location: [facility];  Service: Endoscopy;  Laterality: N/A;  730   ESOPHAGOGASTRODUODENOSCOPY (EGD) WITH PROPOFOL N/A 08/02/2019   Procedure: ESOPHAGOGASTRODUODENOSCOPY (EGD) WITH PROPOFOL;  Surgeon: Malissa Hippo, MD;  Location: [facility];  Service: Endoscopy;  Laterality: N/A;   KNEE SURGERY Right    MASS EXCISION Right 10/21/2020  Procedure: EXCISION CYST 2 CM, RIGHT AXILLA  AND EXCISION OF RIGHT FLANK SKIN LESION;  Surgeon: Lucretia Roers, MD;  Location: [facility];  Service: General;  Laterality: Right;   PARTIAL KNEE ARTHROPLASTY Right 03/07/2018   Procedure: Right knee medial unicompartmental arthroplasty;  Surgeon: Ollen Gross, MD;  Location: [facility];  Service: Orthopedics;  Laterality: Right;  with block   POLYPECTOMY  08/02/2019   Procedure: POLYPECTOMY;  Surgeon: Malissa Hippo, MD;  Location: [facility];  Service: Endoscopy;;  cold snare distal sigmoid   TRANSTHORACIC ECHOCARDIOGRAM  07/2008   LV systolic function is low-normal; RV is normal in size & function; mild MR, mild TR   TUBAL LIGATION     Family History  Problem Relation Age of Onset   Other Mother        heart problems   Dementia Father    Depression Sister    Anxiety disorder Sister    Anxiety disorder Sister    Depression Brother    Anxiety disorder Brother    Other Brother        neck and back surgery   Leukemia Maternal Grandfather    Other Maternal Grandmother        brain tumor   Social History    Socioeconomic History   Marital status: Single    Spouse name: Not on file   Number of children: 4   Years of education: 9   Highest education level: Not on file  Occupational History    Employer: [employer]  Tobacco Use   Smoking status: Every Day    Current packs/day: 0.50    Average packs/day: 0.5 packs/day for 36.9 years (18.5 ttl pk-yrs)    Types: Cigarettes    Start date: 10/11/1986   Smokeless tobacco: Never  Vaping Use   Vaping status: Former   Substances: Nicotine  Substance and Sexual Activity   Alcohol use: Not Currently    Alcohol/week: 1.0 standard drink of alcohol    Types: 1 Glasses of wine per week    Comment: occasionally    Drug use: Not Currently   Sexual activity: Yes    Birth control/protection: Surgical    Comment: tubal  Other Topics Concern   Not on file  Social History Narrative   Not on file   Social Determinants of Health   Financial Resource Strain: Medium Risk (04/20/2022)   Overall Financial Resource Strain (CARDIA)    Difficulty of Paying Living Expenses: Somewhat hard  Food Insecurity: Food Insecurity Present (04/20/2022)   Hunger Vital Sign    Worried About Running Out of Food in the Last Year: Sometimes true    Ran Out of Food in the Last Year: Sometimes true  Transportation Needs: Unmet Transportation Needs (04/20/2022)   PRAPARE - Transportation    Lack of Transportation (Medical): Yes    Lack of Transportation (Non-Medical): Yes  Physical Activity: Inactive (04/20/2022)   Exercise Vital Sign    Days of Exercise per Week: 0 days    Minutes of Exercise per Session: 0 min  Stress: Stress Concern Present (04/20/2022)   [employer] of Occupational Health - Occupational Stress Questionnaire    Feeling of Stress : Rather much  Social Connections: Socially Isolated (04/20/2022)   Social Connection and Isolation Panel [NHANES]    Frequency of Communication with Friends and Family: Twice a week    Frequency of Social Gatherings  with Friends and Family: Never    Attends Religious Services: More than 4  times per year    Active Member of Clubs or Organizations: No    Attends Banker Meetings: Never    Marital Status: Divorced  Catering manager Violence: Not At Risk (04/20/2022)   Humiliation, Afraid, Rape, and Kick questionnaire    Fear of Current or Ex-Partner: No    Emotionally Abused: No    Physically Abused: No    Sexually Abused: No    Review of Systems*** Constitutional: Patient denies any unintentional weight loss or change in strength lntegumentary: Patient denies any rashes or pruritus Eyes: Patient denies ***dry eyes ENT: Patient ***denies dry mouth Cardiovascular: Patient denies chest pain or syncope Respiratory: Patient denies shortness of breath Gastrointestinal: Patient ***denies nausea, vomiting, constipation, or diarrhea Musculoskeletal: Patient denies muscle cramps or weakness Neurologic: Patient denies convulsions or seizures Allergic/Immunologic: Patient denies recent allergic reaction(s) Hematologic/Lymphatic: Patient denies bleeding tendencies Endocrine: Patient denies heat/cold intolerance  GU: As per HPI.  OBJECTIVE There were no vitals filed for this visit. There is no height or weight on file to calculate BMI.  Physical Examination*** Constitutional: No obvious distress; patient is non-toxic appearing  Cardiovascular: No visible lower extremity edema.  Respiratory: The patient does not have audible wheezing/stridor; respirations do not appear labored  Gastrointestinal: Abdomen non-distended Musculoskeletal: Normal ROM of UEs  Skin: No obvious rashes/open sores  Neurologic: CN 2-12 grossly intact Psychiatric: Answered questions appropriately with normal affect  Hematologic/Lymphatic/Immunologic: No obvious bruises or sites of spontaneous bleeding  UA: ***negative *** WBC/hpf, *** RBC/hpf, bacteria (***) PVR: *** ml  ASSESSMENT No diagnosis found. ***  Will  plan for follow up in *** months / ***1 year or sooner if needed. Pt verbalized understanding and agreement. All questions were answered.  PLAN Advised the following: 1. *** 2. ***No follow-ups on file.  No orders of the defined types were placed in this encounter.   It has been explained that the patient is to follow regularly with their PCP in addition to all other providers involved in their care and to follow instructions provided by these respective offices. Patient advised to contact urology clinic if any urologic-pertaining questions, concerns, new symptoms or problems arise in the interim period.  There are no Patient Instructions on file for this visit.  Electronically signed by:  Donnita Falls, FNP   09/12/23    1:43 PM

## 2023-09-12 NOTE — Telephone Encounter (Signed)
Patient is made aware. Patient is scheduled for CT 10/10. Patient confirmed appointment and voiced understanding

## 2023-09-14 ENCOUNTER — Ambulatory Visit (HOSPITAL_COMMUNITY): Payer: Medicaid Other

## 2023-09-19 ENCOUNTER — Ambulatory Visit: Payer: Medicaid Other | Admitting: Urology

## 2023-09-19 DIAGNOSIS — N393 Stress incontinence (female) (male): Secondary | ICD-10-CM

## 2023-09-19 DIAGNOSIS — N200 Calculus of kidney: Secondary | ICD-10-CM

## 2023-09-19 DIAGNOSIS — N3941 Urge incontinence: Secondary | ICD-10-CM

## 2023-09-19 DIAGNOSIS — N3281 Overactive bladder: Secondary | ICD-10-CM

## 2023-09-21 ENCOUNTER — Ambulatory Visit (HOSPITAL_COMMUNITY): Payer: Medicaid Other

## 2023-09-28 NOTE — Progress Notes (Deleted)
Name: Julie [name] DOB: [birth year] MRN: [id number]  History of Present Illness: Julie [name] is a 52 y.o. female who presents today for follow up visit at [facility] [place]. - GU history: 1. OAB. 2. Mixed urinary incontinence (stress predominant). 3. Kidney stones. - 06/22/2023: KUB was normal - no GU stones visualized.  4. Prior incomplete bladder emptying.  At last visit on 07/26/2023: - PVR = 36 ml. Much improved compared to prior (was 348 ml on 09/29/2021). - Reported stress-predominant MUI, OAB, and persistent right flank pain. - The plan was: 1. For stress urinary incontinence: Referred to [facility] for surgical consultation.  2. For OAB / UUI: Start Myrbetriq 25 mg daily & minimize caffeine intake. 4. For persistent right flank pain with history of stones but recent negative KUB: CT stone to evaluate for possible radiolucent stone. Flomax 0.4 mg daily for MET.  Since last visit: > 10/05/2023: CT stone performed this morning***  ***was she seen for SUI surgical consultation at [facility] yet?  Today: She {Actions; denies-reports:120008} symptomatic improvement since starting Myrbetriq (Mirabegron) 25 mg daily.  She reports {Blank multiple:19197::"improved","persistent / unchanged"} urinary ***frequency, ***nocturia, ***urgency, and ***urge incontinence. Voiding ***x/day and ***x/night on average. Leaking ***x/day on average; using *** ***pads / ***diapers per day on average.  She {Actions; denies-reports:120008} dysuria, gross hematuria, straining to void, or sensations of incomplete emptying.  She {Actions; denies-reports:120008} any recent stone passage. She {Actions; denies-reports:120008} flank pain or abdominal pain.   Fall Screening: Do you usually have a device to assist in your mobility? {yes/no:20286} ***cane / ***walker / ***wheelchair   Medications: Current Outpatient Medications  Medication Sig Dispense Refill   ARIPiprazole  (ABILIFY) 5 MG tablet Take 1 tablet (5 mg total) by mouth daily. 90 tablet 0   cetirizine (ZYRTEC) 10 MG tablet cetirizine 10 mg tablet   10 mg by oral route.     DULoxetine (CYMBALTA) 60 MG capsule Take 1 capsule (60 mg total) by mouth daily. 90 capsule 0   fluticasone (FLONASE) 50 MCG/ACT nasal spray Place 2 sprays into both nostrils daily as needed for allergies.      loratadine (CLARITIN) 10 MG tablet Take 10 mg by mouth daily.      mirabegron ER (MYRBETRIQ) 25 MG TB24 tablet Take 1 tablet (25 mg total) by mouth daily. 30 tablet 11   naloxone (NARCAN) 4 MG/0.1ML LIQD nasal spray kit      Olopatadine HCl 0.2 % SOLN      ondansetron (ZOFRAN) 8 MG tablet TAKE 1 TABLET BY MOUTH EVERY 8 HOURS AS NEEDED FOR NAUSEA AND VOMITING. 20 tablet 0   oxyCODONE-acetaminophen (PERCOCET) 10-325 MG tablet Take 1 tablet by mouth every 6 (six) hours as needed for pain.      pantoprazole (PROTONIX) 40 MG tablet Take 1 tablet (40 mg total) by mouth 2 (two) times daily before a meal. 60 tablet 0   pregabalin (LYRICA) 150 MG capsule Take 150 mg by mouth 2 (two) times daily.     PROAIR HFA 108 (90 Base) MCG/ACT inhaler USE 2 PUFFS EVERY 6 HOURS AS NEEDED FOR WHEEZING OR SHORTNESS OF BREATH. (Patient taking differently: Inhale 2 puffs into the lungs every 6 (six) hours as needed for wheezing or shortness of breath.) 8.5 g 0   promethazine (PHENERGAN) 25 MG tablet Take 25 mg by mouth every 6 (six) hours as needed for nausea or vomiting.      tiZANidine (ZANAFLEX) 4 MG tablet Take 4 mg by  mouth 3 (three) times daily as needed.     No current facility-administered medications for this visit.    Allergies: Allergies  Allergen Reactions   Dicyclomine Palpitations    Patient states that her heart rate will go real fast when she takes this medication.   Other Other (See Comments)    House dust   Flagyl [Metronidazole Hcl] Nausea And Vomiting   Morphine Itching   Morphine And Codeine Other (See Comments)    Headache     Penicillins Other (See Comments)    Caused patient to pass out Did it involve swelling of the face/tongue/throat, SOB, or low BP? No Did it involve sudden or severe rash/hives, skin peeling, or any reaction on the inside of your mouth or nose? No Did you need to seek medical attention at a hospital or doctor's office? No When did it last happen?      20 + years If all above answers are "NO", may proceed with cephalosporin use.     Past Medical History:  Diagnosis Date   Anger    Anxiety    Aortic insufficiency 2006   Noted at heart cath - mild to moderate   Arthritis    Asthma    Back pain    BV (bacterial vaginosis) 05/28/2015   Chronic diarrhea    Chronic headache    Depression    Diarrhea 05/28/2015   Dyslipidemia 01/19/2016   Fibroids, submucosal 12/22/2017   GERD (gastroesophageal reflux disease)    History of kidney stones    history of   History of nuclear stress test 07/15/2008   lexiscan; mild-mod perfusion defect in mid anteroseptal, apical anterior, apical septal regions (attenuation artifiact), prominent gut uptake activity in infero-apical region; post-stress EF 64%; EKG negative for ischemia; patient experienced CP during test   Hx of cardiac catheterization 2006   no significant CAD   IBS (irritable bowel syndrome)    Irritable bowel syndrome    Nausea 05/28/2015   Neck pain    Numerous moles 01/18/2016   RLQ abdominal pain 05/28/2015   Seizures (HCC)    2 years ago; unknown etiology and none since then and on no meds   Vaginal discharge 05/28/2015   Weight gain 01/18/2016   Past Surgical History:  Procedure Laterality Date   BIOPSY N/A 02/25/2014   Procedure: BIOPSY;  Surgeon: Malissa Hippo, MD;  Location: [facility];  Service: Endoscopy;  Laterality: N/A;   BREAST ENHANCEMENT SURGERY     CARDIAC CATHETERIZATION  1025//2006   no significant CAD, mild-mod depressed LV systolic function, EF 35-40%, mild-mod AI (Dr. Laurell Josephs)    CARPAL TUNNEL RELEASE  Bilateral 01/01/2016   CESAREAN SECTION     COLONOSCOPY  05/27/2011   snare polypectomy    COLONOSCOPY WITH PROPOFOL N/A 10/21/2016   Procedure: COLONOSCOPY WITH PROPOFOL;  Surgeon: Malissa Hippo, MD;  Location: [facility];  Service: Endoscopy;  Laterality: N/A;  10:30   COLONOSCOPY WITH PROPOFOL N/A 08/02/2019   Procedure: COLONOSCOPY WITH PROPOFOL;  Surgeon: Malissa Hippo, MD;  Location: [facility];  Service: Endoscopy;  Laterality: N/A;  7:30   ESOPHAGOGASTRODUODENOSCOPY N/A 02/25/2014   Procedure: ESOPHAGOGASTRODUODENOSCOPY (EGD);  Surgeon: Malissa Hippo, MD;  Location: [facility];  Service: Endoscopy;  Laterality: N/A;  730   ESOPHAGOGASTRODUODENOSCOPY (EGD) WITH PROPOFOL N/A 08/02/2019   Procedure: ESOPHAGOGASTRODUODENOSCOPY (EGD) WITH PROPOFOL;  Surgeon: Malissa Hippo, MD;  [facility]: [facility];  Service: Endoscopy;  Laterality: N/A;  KNEE SURGERY Right    MASS EXCISION Right 10/21/2020   Procedure: EXCISION CYST 2 CM, RIGHT AXILLA  AND EXCISION OF RIGHT FLANK SKIN LESION;  Surgeon: Lucretia Roers, MD;  Location: [facility];  Service: General;  Laterality: Right;   PARTIAL KNEE ARTHROPLASTY Right 03/07/2018   Procedure: Right knee medial unicompartmental arthroplasty;  Surgeon: Ollen Gross, MD;  Location: [facility];  Service: Orthopedics;  Laterality: Right;  with block   POLYPECTOMY  08/02/2019   Procedure: POLYPECTOMY;  Surgeon: Malissa Hippo, MD;  Location: [facility];  Service: Endoscopy;;  cold snare distal sigmoid   TRANSTHORACIC ECHOCARDIOGRAM  07/2008   LV systolic function is low-normal; RV is normal in size & function; mild MR, mild TR   TUBAL LIGATION     Family History  Problem Relation Age of Onset   Other Mother        heart problems   Dementia Father    Depression Sister    Anxiety disorder Sister    Anxiety disorder Sister    Depression Brother    Anxiety disorder Brother    Other Brother        neck and back surgery   Leukemia Maternal  Grandfather    Other Maternal Grandmother        brain tumor   Social History   Socioeconomic History   Marital status: Single    Spouse name: Not on file   Number of children: 4   Years of education: 9   Highest education level: Not on file  Occupational History    Employer: [employer]  Tobacco Use   Smoking status: Every Day    Current packs/day: 0.50    Average packs/day: 0.5 packs/day for 37.0 years (18.5 ttl pk-yrs)    Types: Cigarettes    Start date: 10/11/1986   Smokeless tobacco: Never  Vaping Use   Vaping status: Former   Substances: Nicotine  Substance and Sexual Activity   Alcohol use: Not Currently    Alcohol/week: 1.0 standard drink of alcohol    Types: 1 Glasses of wine per week    Comment: occasionally    Drug use: Not Currently   Sexual activity: Yes    Birth control/protection: Surgical    Comment: tubal  Other Topics Concern   Not on file  Social History Narrative   Not on file   Social Determinants of Health   Financial Resource Strain: Medium Risk (04/20/2022)   Overall Financial Resource Strain (CARDIA)    Difficulty of Paying Living Expenses: Somewhat hard  Food Insecurity: Food Insecurity Present (04/20/2022)   Hunger Vital Sign    Worried About Running Out of Food in the Last Year: Sometimes true    Ran Out of Food in the Last Year: Sometimes true  Transportation Needs: Unmet Transportation Needs (04/20/2022)   PRAPARE - Transportation    Lack of Transportation (Medical): Yes    Lack of Transportation (Non-Medical): Yes  Physical Activity: Inactive (04/20/2022)   Exercise Vital Sign    Days of Exercise per Week: 0 days    Minutes of Exercise per Session: 0 min  Stress: Stress Concern Present (04/20/2022)   [employer] of Occupational Health - Occupational Stress Questionnaire    Feeling of Stress : Rather much  Social Connections: Socially Isolated (04/20/2022)   Social Connection and Isolation Panel [NHANES]    Frequency of  Communication with Friends and Family: Twice a week    Frequency of Social Gatherings with Friends  and Family: Never    Attends Religious Services: More than 4 times per year    Active Member of Clubs or Organizations: No    Attends Banker Meetings: Never    Marital Status: Divorced  Catering manager Violence: Not At Risk (04/20/2022)   Humiliation, Afraid, Rape, and Kick questionnaire    Fear of Current or Ex-Partner: No    Emotionally Abused: No    Physically Abused: No    Sexually Abused: No    Review of Systems*** Constitutional: Patient denies any unintentional weight loss or change in strength lntegumentary: Patient denies any rashes or pruritus Eyes: Patient denies ***dry eyes ENT: Patient ***denies dry mouth Cardiovascular: Patient denies chest pain or syncope Respiratory: Patient denies shortness of breath Gastrointestinal: Patient ***denies nausea, vomiting, constipation, or diarrhea Musculoskeletal: Patient denies muscle cramps or weakness Neurologic: Patient denies convulsions or seizures Allergic/Immunologic: Patient denies recent allergic reaction(s) Hematologic/Lymphatic: Patient denies bleeding tendencies Endocrine: Patient denies heat/cold intolerance  GU: As per HPI.  OBJECTIVE There were no vitals filed for this visit. There is no height or weight on file to calculate BMI.  Physical Examination*** Constitutional: No obvious distress; patient is non-toxic appearing  Cardiovascular: No visible lower extremity edema.  Respiratory: The patient does not have audible wheezing/stridor; respirations do not appear labored  Gastrointestinal: Abdomen non-distended Musculoskeletal: Normal ROM of UEs  Skin: No obvious rashes/open sores  Neurologic: CN 2-12 grossly intact Psychiatric: Answered questions appropriately with normal affect  Hematologic/Lymphatic/Immunologic: No obvious bruises or sites of spontaneous bleeding  UA: ***negative *** WBC/hpf,  *** RBC/hpf, bacteria (***) PVR: *** ml  ASSESSMENT No diagnosis found. ***  Will plan for follow up in *** months / ***1 year or sooner if needed. Pt verbalized understanding and agreement. All questions were answered.  PLAN Advised the following: 1. *** 2. ***No follow-ups on file.  No orders of the defined types were placed in this encounter.   It has been explained that the patient is to follow regularly with their PCP in addition to all other providers involved in their care and to follow instructions provided by these respective offices. Patient advised to contact urology clinic if any urologic-pertaining questions, concerns, new symptoms or problems arise in the interim period.  There are no Patient Instructions on file for this visit.  Electronically signed by:  Donnita Falls, FNP   09/28/23    10:32 AM

## 2023-10-04 ENCOUNTER — Other Ambulatory Visit: Payer: Self-pay | Admitting: Psychiatry

## 2023-10-05 ENCOUNTER — Ambulatory Visit: Payer: Medicaid Other | Admitting: Urology

## 2023-10-05 ENCOUNTER — Ambulatory Visit (HOSPITAL_COMMUNITY): Payer: Medicaid Other

## 2023-10-05 DIAGNOSIS — N393 Stress incontinence (female) (male): Secondary | ICD-10-CM

## 2023-10-05 DIAGNOSIS — N3281 Overactive bladder: Secondary | ICD-10-CM

## 2023-10-05 DIAGNOSIS — N3941 Urge incontinence: Secondary | ICD-10-CM

## 2023-10-05 DIAGNOSIS — Z87442 Personal history of urinary calculi: Secondary | ICD-10-CM

## 2023-10-05 NOTE — Progress Notes (Deleted)
Name: Julie [name] DOB: [birth year] MRN: [id number]  History of Present Illness: Julie [name] is a 52 y.o. female who presents today for follow up visit at [facility] [place]. - GU history: 1. OAB. 2. Mixed urinary incontinence (stress predominant). 3. Kidney stones. - 06/22/2023: KUB was normal - no GU stones visualized.  4. Prior incomplete bladder emptying.  At last visit on 07/26/2023: - PVR = 36 ml. Much improved compared to prior (was 348 ml on 09/29/2021). - Reported stress-predominant MUI, OAB, and persistent right flank pain. - The plan was: 1. For stress urinary incontinence: Referred to [facility] for surgical consultation.  2. For OAB / UUI: Start Myrbetriq 25 mg daily & minimize caffeine intake. 4. For persistent right flank pain with history of stones but recent negative KUB: CT stone to evaluate for possible radiolucent stone. Flomax 0.4 mg daily for MET.  Since last visit: > 10/05/2023: CT stone performed this morning***  ***was she seen for SUI surgical consultation at [facility] yet?  Today: She {Actions; denies-reports:120008} symptomatic improvement since starting Myrbetriq (Mirabegron) 25 mg daily.  She reports {Blank multiple:19197::"improved","persistent / unchanged"} urinary ***frequency, ***nocturia, ***urgency, and ***urge incontinence. Voiding ***x/day and ***x/night on average. Leaking ***x/day on average; using *** ***pads / ***diapers per day on average.  She {Actions; denies-reports:120008} dysuria, gross hematuria, straining to void, or sensations of incomplete emptying.  She {Actions; denies-reports:120008} any recent stone passage. She {Actions; denies-reports:120008} flank pain or abdominal pain.   Fall Screening: Do you usually have a device to assist in your mobility? {yes/no:20286} ***cane / ***walker / ***wheelchair   Medications: Current Outpatient Medications  Medication Sig Dispense Refill   [START ON  10/14/2023] ARIPiprazole (ABILIFY) 5 MG tablet Take 1 tablet (5 mg total) by mouth daily. 30 tablet 0   cetirizine (ZYRTEC) 10 MG tablet cetirizine 10 mg tablet   10 mg by oral route.     DULoxetine (CYMBALTA) 60 MG capsule Take 1 capsule (60 mg total) by mouth daily. 90 capsule 0   fluticasone (FLONASE) 50 MCG/ACT nasal spray Place 2 sprays into both nostrils daily as needed for allergies.      loratadine (CLARITIN) 10 MG tablet Take 10 mg by mouth daily.      mirabegron ER (MYRBETRIQ) 25 MG TB24 tablet Take 1 tablet (25 mg total) by mouth daily. 30 tablet 11   naloxone (NARCAN) 4 MG/0.1ML LIQD nasal spray kit      Olopatadine HCl 0.2 % SOLN      ondansetron (ZOFRAN) 8 MG tablet TAKE 1 TABLET BY MOUTH EVERY 8 HOURS AS NEEDED FOR NAUSEA AND VOMITING. 20 tablet 0   oxyCODONE-acetaminophen (PERCOCET) 10-325 MG tablet Take 1 tablet by mouth every 6 (six) hours as needed for pain.      pantoprazole (PROTONIX) 40 MG tablet Take 1 tablet (40 mg total) by mouth 2 (two) times daily before a meal. 60 tablet 0   pregabalin (LYRICA) 150 MG capsule Take 150 mg by mouth 2 (two) times daily.     PROAIR HFA 108 (90 Base) MCG/ACT inhaler USE 2 PUFFS EVERY 6 HOURS AS NEEDED FOR WHEEZING OR SHORTNESS OF BREATH. (Patient taking differently: Inhale 2 puffs into the lungs every 6 (six) hours as needed for wheezing or shortness of breath.) 8.5 g 0   promethazine (PHENERGAN) 25 MG tablet Take 25 mg by mouth every 6 (six) hours as needed for nausea or vomiting.      tiZANidine (ZANAFLEX) 4 MG tablet Take  4 mg by mouth 3 (three) times daily as needed.     No current facility-administered medications for this visit.    Allergies: Allergies  Allergen Reactions   Dicyclomine Palpitations    Patient states that her heart rate will go real fast when she takes this medication.   Other Other (See Comments)    House dust   Flagyl [Metronidazole Hcl] Nausea And Vomiting   Morphine Itching   Morphine And Codeine Other (See  Comments)    Headache    Penicillins Other (See Comments)    Caused patient to pass out Did it involve swelling of the face/tongue/throat, SOB, or low BP? No Did it involve sudden or severe rash/hives, skin peeling, or any reaction on the inside of your mouth or nose? No Did you need to seek medical attention at a hospital or doctor's office? No When did it last happen?      20 + years If all above answers are "NO", may proceed with cephalosporin use.     Past Medical History:  Diagnosis Date   Anger    Anxiety    Aortic insufficiency 2006   Noted at heart cath - mild to moderate   Arthritis    Asthma    Back pain    BV (bacterial vaginosis) 05/28/2015   Chronic diarrhea    Chronic headache    Depression    Diarrhea 05/28/2015   Dyslipidemia 01/19/2016   Fibroids, submucosal 12/22/2017   GERD (gastroesophageal reflux disease)    History of kidney stones    history of   History of nuclear stress test 07/15/2008   lexiscan; mild-mod perfusion defect in mid anteroseptal, apical anterior, apical septal regions (attenuation artifiact), prominent gut uptake activity in infero-apical region; post-stress EF 64%; EKG negative for ischemia; patient experienced CP during test   Hx of cardiac catheterization 2006   no significant CAD   IBS (irritable bowel syndrome)    Irritable bowel syndrome    Nausea 05/28/2015   Neck pain    Numerous moles 01/18/2016   RLQ abdominal pain 05/28/2015   Seizures (HCC)    2 years ago; unknown etiology and none since then and on no meds   Vaginal discharge 05/28/2015   Weight gain 01/18/2016   Past Surgical History:  Procedure Laterality Date   BIOPSY N/A 02/25/2014   Procedure: BIOPSY;  Surgeon: Malissa Hippo, MD;  Location: [facility];  Service: Endoscopy;  Laterality: N/A;   BREAST ENHANCEMENT SURGERY     CARDIAC CATHETERIZATION  1025//2006   no significant CAD, mild-mod depressed LV systolic function, EF 35-40%, mild-mod AI (Dr. Laurell Josephs)     CARPAL TUNNEL RELEASE Bilateral 01/01/2016   CESAREAN SECTION     COLONOSCOPY  05/27/2011   snare polypectomy    COLONOSCOPY WITH PROPOFOL N/A 10/21/2016   Procedure: COLONOSCOPY WITH PROPOFOL;  Surgeon: Malissa Hippo, MD;  Location: [facility];  Service: Endoscopy;  Laterality: N/A;  10:30   COLONOSCOPY WITH PROPOFOL N/A 08/02/2019   Procedure: COLONOSCOPY WITH PROPOFOL;  Surgeon: Malissa Hippo, MD;  Location: [facility];  Service: Endoscopy;  Laterality: N/A;  7:30   ESOPHAGOGASTRODUODENOSCOPY N/A 02/25/2014   Procedure: ESOPHAGOGASTRODUODENOSCOPY (EGD);  Surgeon: Malissa Hippo, MD;  [facility]: [facility];  Service: Endoscopy;  Laterality: N/A;  730   ESOPHAGOGASTRODUODENOSCOPY (EGD) WITH PROPOFOL N/A 08/02/2019   Procedure: ESOPHAGOGASTRODUODENOSCOPY (EGD) WITH PROPOFOL;  Surgeon: Malissa Hippo, MD;  [facility]: [facility];  Service: Endoscopy;  Laterality: N/A;   KNEE SURGERY Right    MASS EXCISION Right 10/21/2020   Procedure: EXCISION CYST 2 CM, RIGHT AXILLA  AND EXCISION OF RIGHT FLANK SKIN LESION;  Surgeon: Lucretia Roers, MD;  Location: [facility];  Service: General;  Laterality: Right;   PARTIAL KNEE ARTHROPLASTY Right 03/07/2018   Procedure: Right knee medial unicompartmental arthroplasty;  Surgeon: Ollen Gross, MD;  Location: [facility];  Service: Orthopedics;  Laterality: Right;  with block   POLYPECTOMY  08/02/2019   Procedure: POLYPECTOMY;  Surgeon: Malissa Hippo, MD;  Location: [facility];  Service: Endoscopy;;  cold snare distal sigmoid   TRANSTHORACIC ECHOCARDIOGRAM  07/2008   LV systolic function is low-normal; RV is normal in size & function; mild MR, mild TR   TUBAL LIGATION     Family History  Problem Relation Age of Onset   Other Mother        heart problems   Dementia Father    Depression Sister    Anxiety disorder Sister    Anxiety disorder Sister    Depression Brother    Anxiety disorder Brother    Other Brother        neck and back surgery    Leukemia Maternal Grandfather    Other Maternal Grandmother        brain tumor   Social History   Socioeconomic History   Marital status: Single    Spouse name: Not on file   Number of children: 4   Years of education: 9   Highest education level: Not on file  Occupational History    Employer: [employer]  Tobacco Use   Smoking status: Every Day    Current packs/day: 0.50    Average packs/day: 0.5 packs/day for 37.0 years (18.5 ttl pk-yrs)    Types: Cigarettes    Start date: 10/11/1986   Smokeless tobacco: Never  Vaping Use   Vaping status: Former   Substances: Nicotine  Substance and Sexual Activity   Alcohol use: Not Currently    Alcohol/week: 1.0 standard drink of alcohol    Types: 1 Glasses of wine per week    Comment: occasionally    Drug use: Not Currently   Sexual activity: Yes    Birth control/protection: Surgical    Comment: tubal  Other Topics Concern   Not on file  Social History Narrative   Not on file   Social Determinants of Health   Financial Resource Strain: Medium Risk (04/20/2022)   Overall Financial Resource Strain (CARDIA)    Difficulty of Paying Living Expenses: Somewhat hard  Food Insecurity: Food Insecurity Present (04/20/2022)   Hunger Vital Sign    Worried About Running Out of Food in the Last Year: Sometimes true    Ran Out of Food in the Last Year: Sometimes true  Transportation Needs: Unmet Transportation Needs (04/20/2022)   PRAPARE - Transportation    Lack of Transportation (Medical): Yes    Lack of Transportation (Non-Medical): Yes  Physical Activity: Inactive (04/20/2022)   Exercise Vital Sign    Days of Exercise per Week: 0 days    Minutes of Exercise per Session: 0 min  Stress: Stress Concern Present (04/20/2022)   [employer] of Occupational Health - Occupational Stress Questionnaire    Feeling of Stress : Rather much  Social Connections: Socially Isolated (04/20/2022)   Social Connection and Isolation Panel [NHANES]     Frequency of Communication with Friends and Family: Twice a week    Frequency of  Social Gatherings with Friends and Family: Never    Attends Religious Services: More than 4 times per year    Active Member of [employer] or Organizations: No    Attends Banker Meetings: Never    Marital Status: Divorced  Catering manager Violence: Not At Risk (04/20/2022)   Humiliation, Afraid, Rape, and Kick questionnaire    Fear of Current or Ex-Partner: No    Emotionally Abused: No    Physically Abused: No    Sexually Abused: No    Review of Systems*** Constitutional: Patient denies any unintentional weight loss or change in strength lntegumentary: Patient denies any rashes or pruritus Eyes: Patient denies ***dry eyes ENT: Patient ***denies dry mouth Cardiovascular: Patient denies chest pain or syncope Respiratory: Patient denies shortness of breath Gastrointestinal: Patient ***denies nausea, vomiting, constipation, or diarrhea Musculoskeletal: Patient denies muscle cramps or weakness Neurologic: Patient denies convulsions or seizures Allergic/Immunologic: Patient denies recent allergic reaction(s) Hematologic/Lymphatic: Patient denies bleeding tendencies Endocrine: Patient denies heat/cold intolerance  GU: As per HPI.  OBJECTIVE There were no vitals filed for this visit. There is no height or weight on file to calculate BMI.  Physical Examination*** Constitutional: No obvious distress; patient is non-toxic appearing  Cardiovascular: No visible lower extremity edema.  Respiratory: The patient does not have audible wheezing/stridor; respirations do not appear labored  Gastrointestinal: Abdomen non-distended Musculoskeletal: Normal ROM of UEs  Skin: No obvious rashes/open sores  Neurologic: CN 2-12 grossly intact Psychiatric: Answered questions appropriately with normal affect  Hematologic/Lymphatic/Immunologic: No obvious bruises or sites of spontaneous bleeding  UA: ***negative  *** WBC/hpf, *** RBC/hpf, bacteria (***) PVR: *** ml  ASSESSMENT No diagnosis found. ***  Will plan for follow up in *** months / ***1 year or sooner if needed. Pt verbalized understanding and agreement. All questions were answered.  PLAN Advised the following: 1. *** 2. ***No follow-ups on file.  No orders of the defined types were placed in this encounter.   It has been explained that the patient is to follow regularly with their PCP in addition to all other providers involved in their care and to follow instructions provided by these respective offices. Patient advised to contact urology clinic if any urologic-pertaining questions, concerns, new symptoms or problems arise in the interim period.  There are no Patient Instructions on file for this visit.  Electronically signed by:  Donnita Falls, FNP   10/05/23    4:33 PM

## 2023-10-10 ENCOUNTER — Ambulatory Visit (HOSPITAL_COMMUNITY): Payer: Medicaid Other

## 2023-10-17 ENCOUNTER — Ambulatory Visit: Payer: Medicaid Other | Admitting: Urology

## 2023-10-17 DIAGNOSIS — N3281 Overactive bladder: Secondary | ICD-10-CM

## 2023-10-17 DIAGNOSIS — Z87442 Personal history of urinary calculi: Secondary | ICD-10-CM

## 2023-10-17 DIAGNOSIS — N3941 Urge incontinence: Secondary | ICD-10-CM

## 2023-10-17 DIAGNOSIS — N393 Stress incontinence (female) (male): Secondary | ICD-10-CM

## 2023-10-19 ENCOUNTER — Encounter (HOSPITAL_COMMUNITY): Payer: Self-pay

## 2023-10-19 ENCOUNTER — Ambulatory Visit (HOSPITAL_COMMUNITY): Payer: Medicaid Other | Attending: Urology

## 2023-10-28 NOTE — Progress Notes (Unsigned)
No show

## 2023-10-31 ENCOUNTER — Ambulatory Visit (INDEPENDENT_AMBULATORY_CARE_PROVIDER_SITE_OTHER): Payer: Medicaid Other | Admitting: Psychiatry

## 2023-10-31 DIAGNOSIS — Z91199 Patient's noncompliance with other medical treatment and regimen due to unspecified reason: Secondary | ICD-10-CM

## 2023-11-07 ENCOUNTER — Ambulatory Visit (INDEPENDENT_AMBULATORY_CARE_PROVIDER_SITE_OTHER): Payer: Medicaid Other | Admitting: Psychiatry

## 2023-11-07 DIAGNOSIS — F4310 Post-traumatic stress disorder, unspecified: Secondary | ICD-10-CM | POA: Diagnosis not present

## 2023-11-07 DIAGNOSIS — F330 Major depressive disorder, recurrent, mild: Secondary | ICD-10-CM | POA: Diagnosis not present
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
## 2023-11-13 ENCOUNTER — Ambulatory Visit: Payer: Medicaid Other | Admitting: Urology

## 2023-11-13 NOTE — Progress Notes (Unsigned)
Name: Julie [name] DOB: 02/04/1971 MRN: [id number]  History of Present Illness: Julie [name] is a 52 y.o. female who presents today for follow up visit at [facility]. - GU history: 1. OAB. 2. Mixed urinary incontinence (stress predominant). 3. Kidney stones. - 06/22/2023: KUB was normal - no GU stones visualized.  4. Prior incomplete bladder emptying.  At last visit on 07/26/2023: - PVR = 36 ml. Much improved compared to prior (was 348 ml on 09/29/2021). - Reported stress-predominant MUI, OAB, and persistent right flank pain. - The plan was: 1. For stress urinary incontinence: Referred to [facility] for surgical consultation.  2. For OAB / UUI: Start Myrbetriq 25 mg daily & minimize caffeine intake. 4. For persistent right flank pain with history of stones but recent negative KUB: CT stone to evaluate for possible radiolucent stone. Flomax 0.4 mg daily for MET.  Since last visit: > 10/05/2023: CT stone performed this morning***  ***was she seen for SUI surgical consultation at [facility] yet?  Today: She {Actions; denies-reports:120008} symptomatic improvement since starting Myrbetriq (Mirabegron) 25 mg daily.  She reports {Blank multiple:19197::"improved","persistent / unchanged"} urinary ***frequency, ***nocturia, ***urgency, and ***urge incontinence. Voiding ***x/day and ***x/night on average. Leaking ***x/day on average; using *** ***pads / ***diapers per day on average.  She {Actions; denies-reports:120008} dysuria, gross hematuria, straining to void, or sensations of incomplete emptying.  She {Actions; denies-reports:120008} any recent stone passage. She {Actions; denies-reports:120008} flank pain or abdominal pain.   Fall Screening: Do you usually have a device to assist in your mobility? {yes/no:20286} ***cane / ***walker / ***wheelchair   Medications: Current Outpatient Medications  Medication Sig Dispense Refill   ARIPiprazole  (ABILIFY) 5 MG tablet Take 1 tablet (5 mg total) by mouth daily. 30 tablet 0   cetirizine (ZYRTEC) 10 MG tablet cetirizine 10 mg tablet   10 mg by oral route.     DULoxetine (CYMBALTA) 60 MG capsule Take 1 capsule (60 mg total) by mouth daily. 90 capsule 0   fluticasone (FLONASE) 50 MCG/ACT nasal spray Place 2 sprays into both nostrils daily as needed for allergies.      loratadine (CLARITIN) 10 MG tablet Take 10 mg by mouth daily.      mirabegron ER (MYRBETRIQ) 25 MG TB24 tablet Take 1 tablet (25 mg total) by mouth daily. 30 tablet 11   naloxone (NARCAN) 4 MG/0.1ML LIQD nasal spray kit      Olopatadine HCl 0.2 % SOLN      ondansetron (ZOFRAN) 8 MG tablet TAKE 1 TABLET BY MOUTH EVERY 8 HOURS AS NEEDED FOR NAUSEA AND VOMITING. 20 tablet 0   oxyCODONE-acetaminophen (PERCOCET) 10-325 MG tablet Take 1 tablet by mouth every 6 (six) hours as needed for pain.      pantoprazole (PROTONIX) 40 MG tablet Take 1 tablet (40 mg total) by mouth 2 (two) times daily before a meal. 60 tablet 0   pregabalin (LYRICA) 150 MG capsule Take 150 mg by mouth 2 (two) times daily.     PROAIR HFA 108 (90 Base) MCG/ACT inhaler USE 2 PUFFS EVERY 6 HOURS AS NEEDED FOR WHEEZING OR SHORTNESS OF BREATH. (Patient taking differently: Inhale 2 puffs into the lungs every 6 (six) hours as needed for wheezing or shortness of breath.) 8.5 g 0   promethazine (PHENERGAN) 25 MG tablet Take 25 mg by mouth every 6 (six) hours as needed for nausea or vomiting.      tiZANidine (ZANAFLEX) 4 MG tablet Take 4 mg by  mouth 3 (three) times daily as needed.     No current facility-administered medications for this visit.    Allergies: Allergies  Allergen Reactions   Dicyclomine Palpitations    Patient states that her heart rate will go real fast when she takes this medication.   Other Other (See Comments)    House dust   Flagyl [Metronidazole Hcl] Nausea And Vomiting   Morphine Itching   Morphine And Codeine Other (See Comments)    Headache     Penicillins Other (See Comments)    Caused patient to pass out Did it involve swelling of the face/tongue/throat, SOB, or low BP? No Did it involve sudden or severe rash/hives, skin peeling, or any reaction on the inside of your mouth or nose? No Did you need to seek medical attention at a hospital or doctor's office? No When did it last happen?      20 + years If all above answers are "NO", may proceed with cephalosporin use.     Past Medical History:  Diagnosis Date   Anger    Anxiety    Aortic insufficiency 2006   Noted at heart cath - mild to moderate   Arthritis    Asthma    Back pain    BV (bacterial vaginosis) 05/28/2015   Chronic diarrhea    Chronic headache    Depression    Diarrhea 05/28/2015   Dyslipidemia 01/19/2016   Fibroids, submucosal 12/22/2017   GERD (gastroesophageal reflux disease)    History of kidney stones    history of   History of nuclear stress test 07/15/2008   lexiscan; mild-mod perfusion defect in mid anteroseptal, apical anterior, apical septal regions (attenuation artifiact), prominent gut uptake activity in infero-apical region; post-stress EF 64%; EKG negative for ischemia; patient experienced CP during test   Hx of cardiac catheterization 2006   no significant CAD   IBS (irritable bowel syndrome)    Irritable bowel syndrome    Nausea 05/28/2015   Neck pain    Numerous moles 01/18/2016   RLQ abdominal pain 05/28/2015   Seizures (HCC)    2 years ago; unknown etiology and none since then and on no meds   Vaginal discharge 05/28/2015   Weight gain 01/18/2016   Past Surgical History:  Procedure Laterality Date   BIOPSY N/A 02/25/2014   Procedure: BIOPSY;  Surgeon: Malissa Hippo, MD;  Location: [facility];  Service: Endoscopy;  Laterality: N/A;   BREAST ENHANCEMENT SURGERY     CARDIAC CATHETERIZATION  1025//2006   no significant CAD, mild-mod depressed LV systolic function, EF 35-40%, mild-mod AI (Dr. Laurell Josephs)    CARPAL TUNNEL RELEASE  Bilateral 01/01/2016   CESAREAN SECTION     COLONOSCOPY  05/27/2011   snare polypectomy    COLONOSCOPY WITH PROPOFOL N/A 10/21/2016   Procedure: COLONOSCOPY WITH PROPOFOL;  Surgeon: Malissa Hippo, MD;  Location: [facility];  Service: Endoscopy;  Laterality: N/A;  10:30   COLONOSCOPY WITH PROPOFOL N/A 08/02/2019   Procedure: COLONOSCOPY WITH PROPOFOL;  Surgeon: Malissa Hippo, MD;  Location: [facility];  Service: Endoscopy;  Laterality: N/A;  7:30   ESOPHAGOGASTRODUODENOSCOPY N/A 02/25/2014   Procedure: ESOPHAGOGASTRODUODENOSCOPY (EGD);  Surgeon: Malissa Hippo, MD;  Location: [facility];  Service: Endoscopy;  Laterality: N/A;  730   ESOPHAGOGASTRODUODENOSCOPY (EGD) WITH PROPOFOL N/A 08/02/2019   Procedure: ESOPHAGOGASTRODUODENOSCOPY (EGD) WITH PROPOFOL;  Surgeon: Malissa Hippo, MD;  [facility]: [facility];  Service: Endoscopy;  Laterality: N/A;  KNEE SURGERY Right    MASS EXCISION Right 10/21/2020   Procedure: EXCISION CYST 2 CM, RIGHT AXILLA  AND EXCISION OF RIGHT FLANK SKIN LESION;  Surgeon: Lucretia Roers, MD;  Location: [facility];  Service: General;  Laterality: Right;   PARTIAL KNEE ARTHROPLASTY Right 03/07/2018   Procedure: Right knee medial unicompartmental arthroplasty;  Surgeon: Ollen Gross, MD;  Location: [facility];  Service: Orthopedics;  Laterality: Right;  with block   POLYPECTOMY  08/02/2019   Procedure: POLYPECTOMY;  Surgeon: Malissa Hippo, MD;  Location: [facility];  Service: Endoscopy;;  cold snare distal sigmoid   TRANSTHORACIC ECHOCARDIOGRAM  07/2008   LV systolic function is low-normal; RV is normal in size & function; mild MR, mild TR   TUBAL LIGATION     Family History  Problem Relation Age of Onset   Other Mother        heart problems   Dementia Father    Depression Sister    Anxiety disorder Sister    Anxiety disorder Sister    Depression Brother    Anxiety disorder Brother    Other Brother        neck and back surgery   Leukemia Maternal  Grandfather    Other Maternal Grandmother        brain tumor   Social History   Socioeconomic History   Marital status: Single    Spouse name: Not on file   Number of children: 4   Years of education: 9   Highest education level: Not on file  Occupational History    Employer: [employer]  Tobacco Use   Smoking status: Every Day    Current packs/day: 0.50    Average packs/day: 0.5 packs/day for 37.1 years (18.5 ttl pk-yrs)    Types: Cigarettes    Start date: 10/11/1986   Smokeless tobacco: Never  Vaping Use   Vaping status: Former   Substances: Nicotine  Substance and Sexual Activity   Alcohol use: Not Currently    Alcohol/week: 1.0 standard drink of alcohol    Types: 1 Glasses of wine per week    Comment: occasionally    Drug use: Not Currently   Sexual activity: Yes    Birth control/protection: Surgical    Comment: tubal  Other Topics Concern   Not on file  Social History Narrative   Not on file   Social Determinants of Health   Financial Resource Strain: Medium Risk (04/20/2022)   Overall Financial Resource Strain (CARDIA)    Difficulty of Paying Living Expenses: Somewhat hard  Food Insecurity: Food Insecurity Present (04/20/2022)   Hunger Vital Sign    Worried About Running Out of Food in the Last Year: Sometimes true    Ran Out of Food in the Last Year: Sometimes true  Transportation Needs: Unmet Transportation Needs (04/20/2022)   PRAPARE - Transportation    Lack of Transportation (Medical): Yes    Lack of Transportation (Non-Medical): Yes  Physical Activity: Inactive (04/20/2022)   Exercise Vital Sign    Days of Exercise per Week: 0 days    Minutes of Exercise per Session: 0 min  Stress: Stress Concern Present (04/20/2022)   [employer] of Occupational Health - Occupational Stress Questionnaire    Feeling of Stress : Rather much  Social Connections: Socially Isolated (04/20/2022)   Social Connection and Isolation Panel [NHANES]    Frequency of  Communication with Friends and Family: Twice a week    Frequency of Social Gatherings with Friends  and Family: Never    Attends Religious Services: More than 4 times per year    Active Member of Clubs or Organizations: No    Attends Banker Meetings: Never    Marital Status: Divorced  Catering manager Violence: Not At Risk (04/20/2022)   Humiliation, Afraid, Rape, and Kick questionnaire    Fear of Current or Ex-Partner: No    Emotionally Abused: No    Physically Abused: No    Sexually Abused: No    Review of Systems*** Constitutional: Patient denies any unintentional weight loss or change in strength lntegumentary: Patient denies any rashes or pruritus Eyes: Patient denies ***dry eyes ENT: Patient ***denies dry mouth Cardiovascular: Patient denies chest pain or syncope Respiratory: Patient denies shortness of breath Gastrointestinal: Patient ***denies nausea, vomiting, constipation, or diarrhea Musculoskeletal: Patient denies muscle cramps or weakness Neurologic: Patient denies convulsions or seizures Allergic/Immunologic: Patient denies recent allergic reaction(s) Hematologic/Lymphatic: Patient denies bleeding tendencies Endocrine: Patient denies heat/cold intolerance  GU: As per HPI.  OBJECTIVE There were no vitals filed for this visit. There is no height or weight on file to calculate BMI.  Physical Examination*** Constitutional: No obvious distress; patient is non-toxic appearing  Cardiovascular: No visible lower extremity edema.  Respiratory: The patient does not have audible wheezing/stridor; respirations do not appear labored  Gastrointestinal: Abdomen non-distended Musculoskeletal: Normal ROM of UEs  Skin: No obvious rashes/open sores  Neurologic: CN 2-12 grossly intact Psychiatric: Answered questions appropriately with normal affect  Hematologic/Lymphatic/Immunologic: No obvious bruises or sites of spontaneous bleeding  UA: ***negative *** WBC/hpf,  *** RBC/hpf, bacteria (***) PVR: *** ml  ASSESSMENT Urge incontinence of urine  Stress incontinence, female ***  Will plan for follow up in *** months / ***1 year or sooner if needed. Pt verbalized understanding and agreement. All questions were answered.  PLAN Advised the following: 1. *** 2. ***No follow-ups on file.  No orders of the defined types were placed in this encounter.   It has been explained that the patient is to follow regularly with their PCP in addition to all other providers involved in their care and to follow instructions provided by these respective offices. Patient advised to contact urology clinic if any urologic-pertaining questions, concerns, new symptoms or problems arise in the interim period.  There are no Patient Instructions on file for this visit.  Electronically signed by:  Donnita Falls, FNP   11/13/23    8:39 AM

## 2023-11-21 ENCOUNTER — Ambulatory Visit (HOSPITAL_COMMUNITY): Payer: Medicaid Other | Admitting: Psychiatry

## 2023-11-21 ENCOUNTER — Telehealth (HOSPITAL_COMMUNITY): Payer: Self-pay | Admitting: Psychiatry

## 2023-11-21 ENCOUNTER — Encounter (HOSPITAL_COMMUNITY): Payer: Self-pay

## 2023-11-21 NOTE — Telephone Encounter (Signed)
Therapist attempted to contact patient via text through caregility platform for scheduled appointment, no response.  Therapist called patient who reported she is not feeling well today and canceled today's appointment.

## 2023-12-01 ENCOUNTER — Other Ambulatory Visit: Payer: Self-pay | Admitting: Psychiatry

## 2023-12-01 DIAGNOSIS — F4310 Post-traumatic stress disorder, unspecified: Secondary | ICD-10-CM

## 2023-12-05 ENCOUNTER — Ambulatory Visit (INDEPENDENT_AMBULATORY_CARE_PROVIDER_SITE_OTHER): Payer: Medicaid Other | Admitting: Psychiatry

## 2023-12-05 DIAGNOSIS — F4310 Post-traumatic stress disorder, unspecified: Secondary | ICD-10-CM

## 2023-12-05 DIAGNOSIS — F330 Major depressive disorder, recurrent, mild: Secondary | ICD-10-CM | POA: Diagnosis not present
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
## 2023-12-26 ENCOUNTER — Encounter: Payer: Self-pay | Admitting: Psychiatry

## 2023-12-26 ENCOUNTER — Telehealth (INDEPENDENT_AMBULATORY_CARE_PROVIDER_SITE_OTHER): Payer: Medicaid Other | Admitting: Psychiatry

## 2023-12-26 DIAGNOSIS — G4700 Insomnia, unspecified: Secondary | ICD-10-CM

## 2023-12-26 DIAGNOSIS — F4310 Post-traumatic stress disorder, unspecified: Secondary | ICD-10-CM

## 2023-12-26 DIAGNOSIS — F330 Major depressive disorder, recurrent, mild: Secondary | ICD-10-CM | POA: Diagnosis not present

## 2023-12-26 MED ORDER — RAMELTEON 8 MG PO TABS: 8.0000 mg | ORAL_TABLET | Freq: Every evening | ORAL | 1 refills | Status: DC | PRN

## 2023-12-26 MED ORDER — DULOXETINE HCL 60 MG PO CPEP: 60.0000 mg | ORAL_CAPSULE | Freq: Every day | ORAL | 0 refills | Status: DC

## 2023-12-26 MED ORDER — REXULTI 0.5 MG PO TABS: 0.5000 mg | ORAL_TABLET | Freq: Every day | ORAL | 1 refills | Status: AC

## 2023-12-26 NOTE — Patient Instructions (Signed)
Continue duloxetine 60 mg daily Start rexulti 0.5 mg at night  Hold Abilify once rexulti is approved- Hold lorazepam Start ramelteon 8 mg at night as needed for insomnia Please schedule annual check up with your primary care provider.  Next appointment: 3/25 at 11 30

## 2023-12-27 ENCOUNTER — Telehealth: Payer: Self-pay

## 2023-12-27 NOTE — Telephone Encounter (Signed)
Received fax from pharmacy requesting a Prior Authorization for the patients Ramelteon 8 mg tablets and Rexulti 0.5 mg tablets both were initiated via CoverMyMeds waiting for a determination from the patients insurance

## 2023-12-28 ENCOUNTER — Telehealth: Payer: Self-pay

## 2023-12-28 NOTE — Telephone Encounter (Signed)
Received determination for the patients Rexulti 0.5 mg tablets   Approved  Request Reference Number: [id number]. REXULTI TAB 0.5MG  is approved 12/27/23 through 12/26/2024   Received determination for the patients Ramelteon 8 mg tablets  Denied  Request Reference Number: [id number]. RAMELTEON TAB 8MG  is denied for not meeting the prior authorization requirements

## 2023-12-29 ENCOUNTER — Telehealth: Payer: Self-pay

## 2023-12-29 ENCOUNTER — Other Ambulatory Visit: Payer: Self-pay | Admitting: Psychiatry

## 2023-12-29 DIAGNOSIS — G4700 Insomnia, unspecified: Secondary | ICD-10-CM

## 2023-12-29 NOTE — Telephone Encounter (Signed)
According to the nurse, who spoke with Julie Campus B., she stated that the patient would need to have completed at least three weeks of stimulus control, sleep restriction, relaxation therapy, and sleep hygiene measures. The nurse also communicated that the patient has previously tried Ambien and Trazodone, and this is documented in the chart.  Based on the above information I received from the nurse, I don't believe they necessarily recommended a sleep study. However, I do recommend it regardless. I previously asked to contact the insurance company to find an in-network provider, as I am not familiar with the one in [place]. Please also inquire if she is willing to pursue CBT-I (Cognitive Behavioral Therapy for Insomnia), as this is required by the insurance company for coverage.

## 2023-12-29 NOTE — Telephone Encounter (Signed)
called pt per dr. Vanetta Shawl orders explained that insurance will not cover the rozerem she needed evaluations./ labs /therapy. spoke with patient and she states that she tried [place] and trazodone. (but it is not documented in the chart) she also states that she would do a sleep study if it can be done at home or in [place] where she lives at.

## 2024-01-16 ENCOUNTER — Other Ambulatory Visit: Payer: Self-pay | Admitting: Psychiatry

## 2024-01-17 ENCOUNTER — Other Ambulatory Visit: Payer: Self-pay | Admitting: Psychiatry

## 2024-01-19 ENCOUNTER — Other Ambulatory Visit (HOSPITAL_COMMUNITY)
Admission: RE | Admit: 2024-01-19 | Discharge: 2024-01-19 | Disposition: A | Discharge status: Home / Self Care | Payer: Medicaid Other | Source: Ambulatory Visit | Attending: Obstetrics & Gynecology | Admitting: Obstetrics & Gynecology

## 2024-01-19 ENCOUNTER — Other Ambulatory Visit: Payer: Self-pay | Admitting: Adult Health

## 2024-01-19 ENCOUNTER — Other Ambulatory Visit: Payer: Medicaid Other

## 2024-01-19 DIAGNOSIS — N898 Other specified noninflammatory disorders of vagina: Secondary | ICD-10-CM

## 2024-01-19 MED ORDER — FLUCONAZOLE 150 MG PO TABS: ORAL_TABLET | ORAL | 1 refills | Status: AC

## 2024-01-19 NOTE — Progress Notes (Signed)
   NURSE VISIT- VAGINITIS  SUBJECTIVE:  Julie [name] is a 53 y.o. [id number] GYN patientfemale here for a vaginal swab for vaginitis screening.  She reports the following symptoms: discharge described as white and vulvar itching for 1 week. Denies abnormal vaginal bleeding, significant pelvic pain, fever, or UTI symptoms.  OBJECTIVE:  LMP  (LMP Unknown)   Appears well, in no apparent distress  ASSESSMENT: Vaginal swab for vaginitis screening  PLAN: Self-collected vaginal probe for Gonorrhea, Chlamydia, Trichomonas, Bacterial Vaginosis, Yeast sent to lab Treatment: to be determined once results are received but requests Diflucan for itching to be sent in  Follow-up as needed if symptoms persist/worsen, or new symptoms develop  Jobe Marker  01/19/2024 11:13 AM

## 2024-01-19 NOTE — Progress Notes (Signed)
Rx sent in for diflucan for itching

## 2024-01-22 ENCOUNTER — Telehealth: Payer: Self-pay

## 2024-01-22 LAB — CERVICOVAGINAL ANCILLARY ONLY
Bacterial Vaginitis (gardnerella): POSITIVE — AB
Candida Glabrata: NEGATIVE
Candida Vaginitis: POSITIVE — AB
Chlamydia: NEGATIVE
Comment: NEGATIVE
Comment: NEGATIVE
Comment: NEGATIVE
Comment: NEGATIVE
Comment: NEGATIVE
Comment: NORMAL
Neisseria Gonorrhea: NEGATIVE
Trichomonas: NEGATIVE

## 2024-01-22 NOTE — Telephone Encounter (Signed)
pt called states that she needs a refill on the abilify. she states that the pharmacy told her that you denied. pt last seen on 1-21 next appt 3-25

## 2024-01-22 NOTE — Telephone Encounter (Signed)
I declined to order Abilify as the plan was to start Rexulti in its place. The medication should be ready at the pharmacy. Please advise her to take Rexulti instead of Abilify if she has not already done so.

## 2024-01-22 NOTE — Telephone Encounter (Signed)
called pharmacy- according to him he states that last filled on 12-28 pt picked up medication on 12-30. states that there was no note not to fill on a certain date. there is no rx on fill and pt has no refills.

## 2024-01-22 NOTE — Telephone Encounter (Signed)
left message with information and asked her to call our office back to make sure she taking the new medication. . will try and call her back

## 2024-01-23 ENCOUNTER — Other Ambulatory Visit: Payer: Self-pay | Admitting: Adult Health

## 2024-01-23 MED ORDER — METRONIDAZOLE 0.75 % VA GEL: 1.0000 | Freq: Every day | VAGINAL | 0 refills | Status: AC
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
## 2024-02-27 ENCOUNTER — Ambulatory Visit: Payer: Self-pay | Admitting: Psychiatry

## 2024-03-12 ENCOUNTER — Ambulatory Visit (HOSPITAL_COMMUNITY): Payer: Medicaid Other | Admitting: Psychiatry

## 2024-03-12 DIAGNOSIS — F330 Major depressive disorder, recurrent, mild: Secondary | ICD-10-CM
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
## 2024-03-13 ENCOUNTER — Encounter: Payer: Self-pay | Admitting: Psychiatry

## 2024-03-13 ENCOUNTER — Telehealth: Admitting: Psychiatry

## 2024-03-13 DIAGNOSIS — F4310 Post-traumatic stress disorder, unspecified: Secondary | ICD-10-CM | POA: Diagnosis not present

## 2024-03-13 DIAGNOSIS — F411 Generalized anxiety disorder: Secondary | ICD-10-CM

## 2024-03-13 DIAGNOSIS — F330 Major depressive disorder, recurrent, mild: Secondary | ICD-10-CM | POA: Diagnosis not present

## 2024-03-13 DIAGNOSIS — G4700 Insomnia, unspecified: Secondary | ICD-10-CM | POA: Diagnosis not present

## 2024-03-13 MED ORDER — HYDROXYZINE HCL 25 MG PO TABS: 25.0000 mg | ORAL_TABLET | Freq: Every day | ORAL | 0 refills | Status: DC | PRN

## 2024-03-13 MED ORDER — DULOXETINE HCL 60 MG PO CPEP: 60.0000 mg | ORAL_CAPSULE | Freq: Every day | ORAL | 0 refills | Status: DC

## 2024-03-13 MED ORDER — BREXPIPRAZOLE 0.25 MG PO TABS: 0.2500 mg | ORAL_TABLET | Freq: Every day | ORAL | 0 refills | Status: DC

## 2024-03-13 NOTE — Patient Instructions (Signed)
 Continue duloxetine 60 mg daily Start rexulti 0.5 mg at night  Discontinue ramelteon Please schedule for annual visit with primary care, and also check EKG, labs for thyroid, metabolic panels Next appointment: 5/1 at 3 30,
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
## 2024-03-13 NOTE — Addendum Note (Signed)
 Addended by: Neysa Hotter on: 03/13/2024 05:05 PM   Modules accepted: Orders

## 2024-03-26 ENCOUNTER — Ambulatory Visit (HOSPITAL_COMMUNITY): Payer: Medicaid Other | Admitting: Psychiatry
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
## 2024-04-04 ENCOUNTER — Ambulatory Visit: Admitting: Psychiatry

## 2024-04-04 ENCOUNTER — Telehealth: Payer: Self-pay | Admitting: Psychiatry

## 2024-04-05 ENCOUNTER — Other Ambulatory Visit: Payer: Self-pay | Admitting: Psychiatry

## 2024-04-09 ENCOUNTER — Ambulatory Visit (INDEPENDENT_AMBULATORY_CARE_PROVIDER_SITE_OTHER): Payer: Medicaid Other | Admitting: Psychiatry

## 2024-04-09 DIAGNOSIS — F330 Major depressive disorder, recurrent, mild: Secondary | ICD-10-CM | POA: Diagnosis not present
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
## 2024-04-23 ENCOUNTER — Ambulatory Visit (HOSPITAL_COMMUNITY): Payer: Medicaid Other | Admitting: Psychiatry

## 2024-06-11 ENCOUNTER — Ambulatory Visit

## 2024-06-11 NOTE — Progress Notes (Deleted)
 @Patient  ID: Julie [name] Staff, female    DOB: Jun 21, 1971, 52 y.o.   MRN: [id number]  No chief complaint on file.   Referring provider: Vickey Mettle, MD  HPI: Patient is a 53 year old female with past medical history of generalized anxiety disorder, and tobacco abuse who presents today as a new patient consultation for evaluation of sleep.  TEST/EVENTS :   Allergies  Allergen Reactions   Dicyclomine  Palpitations    Patient states that her heart rate will go real fast when she takes this medication.   Other Other (See Comments)    House dust   Flagyl  [Metronidazole  Hcl] Nausea And Vomiting   Morphine  Itching   Morphine  And Codeine Other (See Comments)    Headache    Penicillins Other (See Comments)    Caused patient to pass out Did it involve swelling of the face/tongue/throat, SOB, or low BP? No Did it involve sudden or severe rash/hives, skin peeling, or any reaction on the inside of your mouth or nose? No Did you need to seek medical attention at a hospital or doctor's office? No When did it last happen?      20 + years If all above answers are "NO", may proceed with cephalosporin use.     Immunization History  Administered Date(s) Administered   Tdap 03/27/2006, 07/29/2015    Past Medical History:  Diagnosis Date   Anger    Anxiety    Aortic insufficiency 2006   Noted at heart cath - mild to moderate   Arthritis    Asthma    Back pain    BV (bacterial vaginosis) 05/28/2015   Chronic diarrhea    Chronic headache    Depression    Diarrhea 05/28/2015   Dyslipidemia 01/19/2016   Fibroids, submucosal 12/22/2017   GERD (gastroesophageal reflux disease)    History of kidney stones    history of   History of nuclear stress test 07/15/2008   lexiscan ; mild-mod perfusion defect in mid anteroseptal, apical anterior, apical septal regions (attenuation artifiact), prominent gut uptake activity in infero-apical region; post-stress EF 64%; EKG negative for ischemia; patient  experienced CP during test   Hx of cardiac catheterization 2006   no significant CAD   IBS (irritable bowel syndrome)    Irritable bowel syndrome    Nausea 05/28/2015   Neck pain    Numerous moles 01/18/2016   RLQ abdominal pain 05/28/2015   Seizures (HCC)    2 years ago; unknown etiology and none since then and on no meds   Vaginal discharge 05/28/2015   Weight gain 01/18/2016    Tobacco History: Social History   Tobacco Use  Smoking Status Every Day   Current packs/day: 0.50   Average packs/day: 0.5 packs/day for 37.7 years (18.8 ttl pk-yrs)   Types: Cigarettes   Start date: 10/11/1986  Smokeless Tobacco Never   Ready to quit: Not Answered Counseling given: Not Answered   Outpatient Medications Prior to Visit  Medication Sig Dispense Refill   metroNIDAZOLE  (METROGEL ) 0.75 % vaginal gel Place 1 Applicatorful vaginally at bedtime. 70 g 0   brexpiprazole  (REXULTI ) 0.25 MG TABS tablet Take 1 tablet (0.25 mg total) by mouth daily. 30 tablet 2   cetirizine  (ZYRTEC ) 10 MG tablet cetirizine  10 mg tablet   10 mg by oral route.     DULoxetine  (CYMBALTA ) 60 MG capsule Take 1 capsule (60 mg total) by mouth daily. 90 capsule 0   fluconazole  (DIFLUCAN ) 150 MG tablet Take 1 now and 1  in 3 days 2 tablet 1   fluticasone  (FLONASE ) 50 MCG/ACT nasal spray Place 2 sprays into both nostrils daily as needed for allergies.      hydrOXYzine  (ATARAX ) 25 MG tablet Take 1 tablet (25 mg total) by mouth daily as needed for anxiety. 30 tablet 2   loratadine  (CLARITIN ) 10 MG tablet Take 10 mg by mouth daily.      mirabegron  ER (MYRBETRIQ ) 25 MG TB24 tablet Take 1 tablet (25 mg total) by mouth daily. 30 tablet 11   naloxone (NARCAN) 4 MG/0.1ML LIQD nasal spray kit      Olopatadine HCl 0.2 % SOLN      ondansetron  (ZOFRAN ) 8 MG tablet TAKE 1 TABLET BY MOUTH EVERY 8 HOURS AS NEEDED FOR NAUSEA AND VOMITING. 20 tablet 0   oxyCODONE -acetaminophen  (PERCOCET) 10-325 MG tablet Take 1 tablet by mouth every 6 (six)  hours as needed for pain.      pantoprazole  (PROTONIX ) 40 MG tablet Take 1 tablet (40 mg total) by mouth 2 (two) times daily before a meal. 60 tablet 0   pregabalin (LYRICA) 150 MG capsule Take 150 mg by mouth 2 (two) times daily.     PROAIR  HFA 108 (90 Base) MCG/ACT inhaler USE 2 PUFFS EVERY 6 HOURS AS NEEDED FOR WHEEZING OR SHORTNESS OF BREATH. (Patient taking differently: Inhale 2 puffs into the lungs every 6 (six) hours as needed for wheezing or shortness of breath.) 8.5 g 0   promethazine  (PHENERGAN ) 25 MG tablet Take 25 mg by mouth every 6 (six) hours as needed for nausea or vomiting.      tiZANidine (ZANAFLEX) 4 MG tablet Take 4 mg by mouth 3 (three) times daily as needed.     No facility-administered medications prior to visit.     Review of Systems:   Constitutional:   No  weight loss, night sweats,  Fevers, chills, fatigue, or  lassitude.  HEENT:   No headaches,  Difficulty swallowing,  Tooth/dental problems, or  Sore throat,                No sneezing, itching, ear ache, nasal congestion, post nasal drip,   CV:  No chest pain,  Orthopnea, PND, swelling in lower extremities, anasarca, dizziness, palpitations, syncope.   GI  No heartburn, indigestion, abdominal pain, nausea, vomiting, diarrhea, change in bowel habits, loss of appetite, bloody stools.   Resp: No shortness of breath with exertion or at rest.  No excess mucus, no productive cough,  No non-productive cough,  No coughing up of blood.  No change in color of mucus.  No wheezing.  No chest wall deformity  Skin: no rash or lesions.  GU: no dysuria, change in color of urine, no urgency or frequency.  No flank pain, no hematuria   MS:  No joint pain or swelling.  No decreased range of motion.  No back pain.    Physical Exam  LMP  (LMP Unknown)   GEN: A/Ox3; pleasant , NAD, well nourished    HEENT:  [place]/AT,  EACs-clear, TMs-wnl, NOSE-clear, THROAT-clear, no lesions, no postnasal drip or exudate noted.   NECK:   Supple w/ fair ROM; no JVD; normal carotid impulses w/o bruits; no thyromegaly or nodules palpated; no lymphadenopathy.    RESP  Clear  P & A; w/o, wheezes/ rales/ or rhonchi. no accessory muscle use, no dullness to percussion  CARD:  RRR, no m/r/g, no peripheral edema, pulses intact, no cyanosis or clubbing.  GI:   Soft &  nt; nml bowel sounds; no organomegaly or masses detected.   Musco: Warm bil, no deformities or joint swelling noted.   Neuro: alert, no focal deficits noted.    Skin: Warm, no lesions or rashes    Lab Results:  CBC    Component Value Date/Time   WBC 12.3 (H) 10/11/2018 1503   WBC 19.4 (H) 03/08/2018 0518   RBC 3.61 (L) 10/11/2018 1503   RBC 3.24 (L) 03/08/2018 0518   HGB 10.2 (L) 10/11/2018 1503   HCT 32.3 (L) 10/11/2018 1503   PLT 411 10/11/2018 1503   MCV 90 10/11/2018 1503   MCH 28.3 10/11/2018 1503   MCH 29.6 03/08/2018 0518   MCHC 31.6 10/11/2018 1503   MCHC 31.5 03/08/2018 0518   RDW 16.7 (H) 10/11/2018 1503   LYMPHSABS 3.6 01/10/2018 1306   MONOABS 1.0 01/10/2018 1306   EOSABS 0.3 01/10/2018 1306   BASOSABS 0.0 01/10/2018 1306    BMET    Component Value Date/Time   NA 136 03/08/2018 0518   NA 140 11/22/2017 1622   K 3.6 03/08/2018 0518   CL 103 03/08/2018 0518   CO2 24 03/08/2018 0518   GLUCOSE 165 (H) 03/08/2018 0518   BUN 5 (L) 03/08/2018 0518   BUN 5 (L) 11/22/2017 1622   CREATININE 0.68 03/08/2018 0518   CREATININE 0.71 10/12/2015 1643   CALCIUM 8.5 (L) 03/08/2018 0518   GFRNONAA >60 03/08/2018 0518   GFRAA >60 03/08/2018 0518    BNP    Component Value Date/Time   BNP 53.0 06/04/2015 1325    ProBNP    Component Value Date/Time   PROBNP 31.8 02/06/2014 1635    Imaging: No results found.  Administration History     None           No data to display          No results found for: NITRICOXIDE      Assessment & Plan:   No problem-specific Assessment & Plan notes found for this  encounter.     Candis Dandy, PA-C 06/11/2024

## 2024-07-04 ENCOUNTER — Ambulatory Visit (HOSPITAL_COMMUNITY): Admitting: Registered Nurse

## 2024-07-04 ENCOUNTER — Encounter (HOSPITAL_COMMUNITY): Payer: Self-pay | Admitting: Registered Nurse

## 2024-07-04 DIAGNOSIS — F4322 Adjustment disorder with anxiety: Secondary | ICD-10-CM

## 2024-07-04 DIAGNOSIS — Z79899 Other long term (current) drug therapy: Secondary | ICD-10-CM

## 2024-07-04 DIAGNOSIS — G4700 Insomnia, unspecified: Secondary | ICD-10-CM | POA: Diagnosis not present

## 2024-07-04 DIAGNOSIS — F331 Major depressive disorder, recurrent, moderate: Secondary | ICD-10-CM | POA: Diagnosis not present

## 2024-07-04 DIAGNOSIS — F4310 Post-traumatic stress disorder, unspecified: Secondary | ICD-10-CM | POA: Diagnosis not present

## 2024-07-04 MED ORDER — DULOXETINE HCL 60 MG PO CPEP: 60.0000 mg | ORAL_CAPSULE | Freq: Every day | ORAL | 0 refills | Status: DC

## 2024-07-04 MED ORDER — BREXPIPRAZOLE 0.5 MG PO TABS: 0.5000 mg | ORAL_TABLET | Freq: Every day | ORAL | 1 refills | Status: DC

## 2024-07-04 MED ORDER — MIRTAZAPINE 7.5 MG PO TABS: 7.5000 mg | ORAL_TABLET | Freq: Every day | ORAL | 1 refills | Status: DC

## 2024-07-04 NOTE — Patient Instructions (Addendum)
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
## 2024-07-19 ENCOUNTER — Telehealth: Payer: Self-pay

## 2024-07-19 NOTE — Telephone Encounter (Signed)
 received fax requesting a refill on the rexulti . pt was last seen on 4-9 pt does not have a follow up appt so i sent message to front desk staff to contact pt to set up a follow up appt for medication refills.

## 2024-07-19 NOTE — Telephone Encounter (Signed)
 Pharmacy notified.

## 2024-07-19 NOTE — Telephone Encounter (Addendum)
 Patient no-showed for the scheduled appointment. She had previously indicated plans to establish care with a provider in [place]. No follow-up will be scheduled, and no refills will be prescribed from our office.

## 2024-08-05 ENCOUNTER — Other Ambulatory Visit: Payer: Self-pay

## 2024-08-05 ENCOUNTER — Emergency Department (HOSPITAL_COMMUNITY)

## 2024-08-05 ENCOUNTER — Emergency Department (HOSPITAL_COMMUNITY)
Admission: EM | Admit: 2024-08-05 | Discharge: 2024-08-05 | Disposition: A | Discharge status: Home / Self Care | Source: Ambulatory Visit | Attending: Emergency Medicine | Admitting: Emergency Medicine

## 2024-08-05 ENCOUNTER — Encounter (HOSPITAL_COMMUNITY): Payer: Self-pay

## 2024-08-05 DIAGNOSIS — R1010 Upper abdominal pain, unspecified: Secondary | ICD-10-CM

## 2024-08-05 DIAGNOSIS — D72829 Elevated white blood cell count, unspecified: Secondary | ICD-10-CM | POA: Diagnosis not present

## 2024-08-05 DIAGNOSIS — R1011 Right upper quadrant pain: Secondary | ICD-10-CM | POA: Insufficient documentation

## 2024-08-05 DIAGNOSIS — R1013 Epigastric pain: Secondary | ICD-10-CM | POA: Diagnosis not present

## 2024-08-05 LAB — URINALYSIS, ROUTINE W REFLEX MICROSCOPIC
Bacteria, UA: NONE SEEN
Bilirubin Urine: NEGATIVE
Glucose, UA: NEGATIVE mg/dL
Ketones, ur: NEGATIVE mg/dL
Leukocytes,Ua: NEGATIVE
Nitrite: NEGATIVE
Protein, ur: NEGATIVE mg/dL
Specific Gravity, Urine: 1.004 — ABNORMAL LOW (ref 1.005–1.030)
pH: 6 (ref 5.0–8.0)

## 2024-08-05 LAB — CBC
HCT: 32.6 % — ABNORMAL LOW (ref 36.0–46.0)
Hemoglobin: 10.4 g/dL — ABNORMAL LOW (ref 12.0–15.0)
MCH: 28 pg (ref 26.0–34.0)
MCHC: 31.9 g/dL (ref 30.0–36.0)
MCV: 87.6 fL (ref 80.0–100.0)
Platelets: 338 K/uL (ref 150–400)
RBC: 3.72 MIL/uL — ABNORMAL LOW (ref 3.87–5.11)
RDW: 16.4 % — ABNORMAL HIGH (ref 11.5–15.5)
WBC: 11 K/uL — ABNORMAL HIGH (ref 4.0–10.5)
nRBC: 0 % (ref 0.0–0.2)

## 2024-08-05 LAB — COMPREHENSIVE METABOLIC PANEL WITH GFR
ALT: 14 U/L (ref 0–44)
AST: 20 U/L (ref 15–41)
Albumin: 3.9 g/dL (ref 3.5–5.0)
Alkaline Phosphatase: 122 U/L (ref 38–126)
Anion gap: 9 (ref 5–15)
BUN: 7 mg/dL (ref 6–20)
CO2: 24 mmol/L (ref 22–32)
Calcium: 8.8 mg/dL — ABNORMAL LOW (ref 8.9–10.3)
Chloride: 102 mmol/L (ref 98–111)
Creatinine, Ser: 0.73 mg/dL (ref 0.44–1.00)
GFR, Estimated: >60 mL/min (ref >60)
Glucose, Bld: 107 mg/dL — ABNORMAL HIGH (ref 70–99)
Potassium: 3.6 mmol/L (ref 3.5–5.1)
Sodium: 135 mmol/L (ref 135–145)
Total Bilirubin: 0.6 mg/dL (ref 0.0–1.2)
Total Protein: 7.7 g/dL (ref 6.5–8.1)

## 2024-08-05 LAB — LIPASE, BLOOD: Lipase: 24 U/L (ref 11–51)

## 2024-08-05 MED ORDER — HYDROMORPHONE HCL 1 MG/ML IJ SOLN
1.0000 mg | Freq: Once | INTRAMUSCULAR | Status: AC
  Administered 2024-08-05: 1 mg via INTRAVENOUS
  Filled 2024-08-05: qty 1

## 2024-08-05 MED ORDER — PANTOPRAZOLE SODIUM 40 MG IV SOLR
40.0000 mg | Freq: Once | INTRAVENOUS | Status: AC
  Administered 2024-08-05: 40 mg via INTRAVENOUS
  Filled 2024-08-05: qty 10

## 2024-08-05 MED ORDER — IOHEXOL 300 MG/ML  SOLN
100.0000 mL | Freq: Once | INTRAMUSCULAR | Status: AC | PRN
  Administered 2024-08-05: 100 mL via INTRAVENOUS

## 2024-08-05 NOTE — ED Notes (Signed)
 Patient stated that her boyfriend is driving her home.

## 2024-08-05 NOTE — ED Triage Notes (Signed)
 Pt sent by [facility]. Pt experience RUQ pain since last night. Pt denies n/v/d. Pt states having this episode a week ago but went away. Pt states intermittently intense pain like someone stabbing in her side. Pt states cold chills last night unknown if she had a fever.

## 2024-08-05 NOTE — ED Provider Notes (Signed)
 [place] [facility] AT [facility] Provider Note   CSN: [id number] Arrival date & time: 08/05/24  1247     Patient presents with: Abdominal Pain   Julie [name] is a 53 y.o. female.  She is here with a complaint of severe right upper quadrant pain radiating through to the back that started yesterday.  She rates it as 10 out of 10.  No nausea vomiting chills fever or urinary symptoms diarrhea constipation.  She had a similar episode about a week ago went to urgent care and saw her PCP.  Had some labs and was was to get an ultrasound but has not had it yet.  {Add pertinent medical, surgical, social history, OB history to [id number]} The history is provided by the patient.  Abdominal Pain Pain location:  RUQ Pain quality: cramping and stabbing   Pain radiates to:  Back Pain severity:  Severe Onset quality:  Sudden Duration:  2 days Timing:  Intermittent Progression:  Unchanged Chronicity:  New Context: not trauma   Relieved by:  None tried Worsened by:  Nothing Ineffective treatments:  None tried Associated symptoms: no chest pain, no dysuria, no fever, no nausea, no shortness of breath, no sore throat and no vomiting   Risk factors: not pregnant and no recent hospitalization        Prior to Admission medications   Medication Sig Start Date End Date Taking? Authorizing Provider  metroNIDAZOLE  (METROGEL ) 0.75 % vaginal gel Place 1 Applicatorful vaginally at bedtime. 01/23/24   Signa Delon LABOR, NP  Brexpiprazole  (REXULTI ) 0.5 MG TABS Take 1 tablet (0.5 mg total) by mouth daily. 07/04/24 10/02/24  Rankin, Shuvon B, NP  cetirizine  (ZYRTEC ) 10 MG tablet cetirizine  10 mg tablet   10 mg by oral route.    [provider]  DULoxetine  (CYMBALTA ) 60 MG capsule Take 1 capsule (60 mg total) by mouth daily. 07/04/24 10/02/24  Rankin, Shuvon B, NP  fluconazole  (DIFLUCAN ) 150 MG tablet Take 1 now and 1 in 3 days 01/19/24   Signa Delon LABOR, NP  fluticasone   (FLONASE ) 50 MCG/ACT nasal spray Place 2 sprays into both nostrils daily as needed for allergies.     [provider]  loratadine  (CLARITIN ) 10 MG tablet Take 10 mg by mouth daily.     [provider]  mirabegron  ER (MYRBETRIQ ) 25 MG TB24 tablet Take 1 tablet (25 mg total) by mouth daily. 07/26/23   Gerldine Lauraine BROCKS, FNP  mirtazapine  (REMERON ) 7.5 MG tablet Take 1 tablet (7.5 mg total) by mouth at bedtime. 07/04/24   Rankin, Shuvon B, NP  naloxone (NARCAN) 4 MG/0.1ML LIQD nasal spray kit     [provider]  ondansetron  (ZOFRAN ) 8 MG tablet TAKE 1 TABLET BY MOUTH EVERY 8 HOURS AS NEEDED FOR NAUSEA AND VOMITING. 01/16/19   Griffin, Jennifer A, NP  oxyCODONE -acetaminophen  (PERCOCET) 10-325 MG tablet Take 1 tablet by mouth every 6 (six) hours as needed for pain.     [provider]  pantoprazole  (PROTONIX ) 40 MG tablet Take 1 tablet (40 mg total) by mouth 2 (two) times daily before a meal. 04/06/21   Rehman, Claudis PENNER, MD  pregabalin (LYRICA) 150 MG capsule Take 150 mg by mouth 2 (two) times daily. 04/11/22   [provider]  PROAIR  HFA 108 (90 Base) MCG/ACT inhaler USE 2 PUFFS EVERY 6 HOURS AS NEEDED FOR WHEEZING OR SHORTNESS OF BREATH. Patient taking differently: Inhale 2 puffs into the lungs every 6 (six) hours as  needed for wheezing or shortness of breath. 10/10/18   Signa Delon LABOR, NP  promethazine  (PHENERGAN ) 25 MG tablet Take 25 mg by mouth every 6 (six) hours as needed for nausea or vomiting.     [provider]  tiZANidine (ZANAFLEX) 4 MG tablet Take 4 mg by mouth 3 (three) times daily as needed. 12/14/21   [provider]    Allergies: Dicyclomine , Other, Flagyl  [metronidazole  hcl], Morphine , Morphine  and codeine, and Penicillins    Review of Systems  Constitutional:  Negative for fever.  HENT:  Negative for sore throat.   Respiratory:  Negative for shortness of breath.   Cardiovascular:  Negative for chest pain.   Gastrointestinal:  Positive for abdominal pain. Negative for nausea and vomiting.  Genitourinary:  Negative for dysuria.  Musculoskeletal:  Positive for back pain.  Skin:  Negative for rash.    Updated Vital Signs BP (!) 118/92   Pulse 95   Temp 98.9 F (37.2 C) (Oral)   Resp 20   Ht 5' (1.524 m)   Wt 62.7 kg   LMP  (LMP Unknown)   SpO2 97%   BMI 27.00 kg/m   Physical Exam Vitals and nursing note reviewed.  Constitutional:      General: She is not in acute distress.    Appearance: Normal appearance. She is well-developed.  HENT:     Head: Normocephalic and atraumatic.  Eyes:     Conjunctiva/sclera: Conjunctivae normal.  Cardiovascular:     Rate and Rhythm: Normal rate and regular rhythm.     Heart sounds: No murmur heard. Pulmonary:     Effort: Pulmonary effort is normal. No respiratory distress.     Breath sounds: Normal breath sounds. No stridor. No wheezing.  Abdominal:     Palpations: Abdomen is soft.     Tenderness: There is abdominal tenderness in the right upper quadrant and epigastric area. There is no guarding or rebound.  Musculoskeletal:        General: No deformity.     Cervical back: Neck supple.  Skin:    General: Skin is warm and dry.  Neurological:     General: No focal deficit present.     Mental Status: She is alert.     GCS: GCS eye subscore is 4. GCS verbal subscore is 5. GCS motor subscore is 6.     (all labs ordered are listed, but only abnormal results are displayed) Labs Reviewed  LIPASE, BLOOD  COMPREHENSIVE METABOLIC PANEL WITH GFR  CBC  URINALYSIS, ROUTINE W REFLEX MICROSCOPIC    EKG: EKG Interpretation Date/Time:  Monday August 05 2024 13:02:11 EDT Ventricular Rate:  82 PR Interval:  143 QRS Duration:  76 QT Interval:  381 QTC Calculation: 445 R Axis:   81  Text Interpretation: Sinus rhythm rate slower than prior 2/19 Confirmed by Towana Sharper [phone]) on 08/05/2024 1:10:09 PM  Radiology: No results  found.  {Document cardiac monitor, telemetry assessment procedure when appropriate:32947} Procedures   Medications Ordered in the ED  HYDROmorphone  (DILAUDID ) injection 1 mg (has no administration in time range)      {Click here for ABCD2, HEART and other calculators REFRESH Note before signing:1}                              Medical Decision Making Amount and/or Complexity of Data Reviewed Labs: ordered. Radiology: ordered.  Risk Prescription drug management.   This patient complains of ***;  this involves an extensive number of treatment Options and is a complaint that carries with it a high risk of complications and morbidity. The differential includes ***  I ordered, reviewed and interpreted labs, which included *** I ordered medication *** and reviewed PMP when indicated. I ordered imaging studies which included *** and I independently    visualized and interpreted imaging which showed *** Additional history obtained from *** Previous records obtained and reviewed *** I consulted *** and discussed lab and imaging findings and discussed disposition.  Cardiac monitoring reviewed, *** Social determinants considered, *** Critical Interventions: ***  After the interventions stated above, I reevaluated the patient and found *** Admission and further testing considered, ***   {Document critical care time when appropriate  Document review of labs and clinical decision tools ie CHADS2VASC2, etc  Document your independent review of radiology images and any outside records  Document your discussion with family members, caretakers and with consultants  Document social determinants of health affecting pt's care  Document your decision making why or why not admission, treatments were needed:32947:::1}   Final diagnoses:  None    ED Discharge Orders     None

## 2024-08-05 NOTE — Discharge Instructions (Signed)
 You were seen in the emergency department for upper abdominal pain.  You had lab work urinalysis, right upper quadrant ultrasound, and a CAT scan of your abdomen that did not show a definite answer for your symptoms.  They did see moderate constipation of your upper abdomen and this may be contributing to your pain.  Please drink plenty of fluids, increase your fiber intake.  Consider Metamucil and MiraLAX .  Follow-up with your primary care doctor.  Return to the emergency department if any worsening or concerning symptoms

## 2024-08-13 ENCOUNTER — Ambulatory Visit: Admitting: Adult Health

## 2024-08-26 ENCOUNTER — Other Ambulatory Visit (HOSPITAL_COMMUNITY): Payer: Self-pay | Admitting: Internal Medicine

## 2024-08-26 DIAGNOSIS — R1011 Right upper quadrant pain: Secondary | ICD-10-CM

## 2024-09-03 ENCOUNTER — Encounter (HOSPITAL_COMMUNITY): Admission: RE | Admit: 2024-09-03 | Source: Ambulatory Visit

## 2024-09-10 ENCOUNTER — Encounter (HOSPITAL_COMMUNITY): Payer: Self-pay

## 2024-09-10 ENCOUNTER — Other Ambulatory Visit (HOSPITAL_COMMUNITY)

## 2024-09-18 ENCOUNTER — Telehealth (HOSPITAL_COMMUNITY): Payer: Self-pay

## 2024-09-18 ENCOUNTER — Other Ambulatory Visit (HOSPITAL_COMMUNITY): Payer: Self-pay | Admitting: Registered Nurse

## 2024-09-18 ENCOUNTER — Encounter (INDEPENDENT_AMBULATORY_CARE_PROVIDER_SITE_OTHER): Payer: Self-pay | Admitting: Gastroenterology

## 2024-09-18 DIAGNOSIS — F331 Major depressive disorder, recurrent, moderate: Secondary | ICD-10-CM

## 2024-09-18 DIAGNOSIS — F4310 Post-traumatic stress disorder, unspecified: Secondary | ICD-10-CM

## 2024-09-18 DIAGNOSIS — F4322 Adjustment disorder with anxiety: Secondary | ICD-10-CM

## 2024-09-18 MED ORDER — DULOXETINE HCL 60 MG PO CPEP: 60.0000 mg | ORAL_CAPSULE | Freq: Every day | ORAL | 1 refills | Status: DC

## 2024-09-18 MED ORDER — REXULTI 0.5 MG PO TABS: 0.5000 mg | ORAL_TABLET | Freq: Every day | ORAL | 1 refills | Status: DC

## 2024-09-18 NOTE — Telephone Encounter (Signed)
 [facility] faxed over a refill request for pt's Mirtazapine  7.5 MG Tablets and Rexulti  0.5 MG tablets. Called pt to schedule f/u no answer left vm. Please advise

## 2024-10-17 ENCOUNTER — Telehealth (INDEPENDENT_AMBULATORY_CARE_PROVIDER_SITE_OTHER): Admitting: Registered Nurse

## 2024-10-17 ENCOUNTER — Encounter (HOSPITAL_COMMUNITY): Payer: Self-pay | Admitting: Registered Nurse

## 2024-10-17 DIAGNOSIS — G4700 Insomnia, unspecified: Secondary | ICD-10-CM

## 2024-10-17 DIAGNOSIS — F4310 Post-traumatic stress disorder, unspecified: Secondary | ICD-10-CM | POA: Diagnosis not present

## 2024-10-17 DIAGNOSIS — F4322 Adjustment disorder with anxiety: Secondary | ICD-10-CM

## 2024-10-17 DIAGNOSIS — F331 Major depressive disorder, recurrent, moderate: Secondary | ICD-10-CM

## 2024-10-17 MED ORDER — PROPRANOLOL HCL 10 MG PO TABS: 10.0000 mg | ORAL_TABLET | Freq: Three times a day (TID) | ORAL | 1 refills | Status: DC | PRN

## 2024-10-17 MED ORDER — MIRTAZAPINE 15 MG PO TABS: 15.0000 mg | ORAL_TABLET | Freq: Every day | ORAL | 1 refills | Status: DC

## 2024-10-17 MED ORDER — DULOXETINE HCL 60 MG PO CPEP: 60.0000 mg | ORAL_CAPSULE | Freq: Every day | ORAL | 1 refills | Status: DC

## 2024-10-17 MED ORDER — BREXPIPRAZOLE 1 MG PO TABS: 1.0000 mg | ORAL_TABLET | Freq: Every day | ORAL | 1 refills | Status: DC
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
## 2024-10-17 NOTE — Patient Instructions (Signed)

## 2024-11-19 ENCOUNTER — Telehealth (HOSPITAL_COMMUNITY): Payer: Self-pay | Admitting: *Deleted

## 2024-11-19 NOTE — Telephone Encounter (Signed)
 Per pt she was on Ativan  and it was helping her with her Anxiety. Per pt provider put her on Diclofenac and it is not working. Per pt she have not took the Ativan  in a while which is about 6months. Per pt current list of medications, it looks like this medication is not on the list. Patient states that her anxiety is worse and she can not deal with it. Per pt she would like to go back on her Ativan . Staff then informed patient that she don't think provider prescribes this Ativan  but message will be sent to covering provider. Patient then stated that provider informed her that they were going to go back on Ativan  if this medication do not work.   Staff called pharmacy [facility]) to get more clarification on this medication and see who wrote this medication for patient. Spoke with pharmacy and was informed that the provider that wrote Diclofenac was Dr. Fitzko at [facility] internal and he only sent 2weeks worth of script. Per pharmacy, patient filled this medication 10/30/2024 and that was the only time this medication was filled there.   When staff addressed patient about what pharmacy stated and she gived staff the wrong name of the medication. Per pt she don't even know if provider added her to any new medication for her anxiety. Staff read note about medications (under recommendations section) and she said she have a lot going on right now and it's a lot to deal with right now. Per patient all she knows is that she is still having anxiety.

## 2024-12-12 NOTE — Telephone Encounter (Signed)
 Tried getting a hold of patient to schedule appt and was not able to reach her.

## 2024-12-16 ENCOUNTER — Other Ambulatory Visit (HOSPITAL_COMMUNITY): Payer: Self-pay | Admitting: Registered Nurse

## 2024-12-16 ENCOUNTER — Telehealth (HOSPITAL_COMMUNITY): Payer: Self-pay | Admitting: *Deleted

## 2024-12-16 DIAGNOSIS — F331 Major depressive disorder, recurrent, moderate: Secondary | ICD-10-CM

## 2024-12-16 DIAGNOSIS — F4322 Adjustment disorder with anxiety: Secondary | ICD-10-CM

## 2024-12-16 DIAGNOSIS — G4700 Insomnia, unspecified: Secondary | ICD-10-CM

## 2024-12-16 MED ORDER — PROPRANOLOL HCL 20 MG PO TABS: 10.0000 mg | ORAL_TABLET | Freq: Three times a day (TID) | ORAL | 1 refills | Status: DC | PRN

## 2024-12-16 MED ORDER — MIRTAZAPINE 30 MG PO TABS: 15.0000 mg | ORAL_TABLET | Freq: Every day | ORAL | 1 refills | Status: DC

## 2024-12-16 NOTE — Telephone Encounter (Signed)
 Telephone call informing that current medications are not helpful and anxiety and sleep and requesting to restart Ativan .  PDMP reviewed and no Ativan  noted up to 12/2023.  She is also aware that this provider doesn't prescribe benzodiazepines for long term use.  Remeron  increased to 30 mg daily at bedtime and Propranolol  increased to 20 mg three times daily as needed.     Meds ordered this encounter  Medications   DISCONTD: propranolol  (INDERAL ) 20 MG tablet    Sig: Take 0.5 tablets (10 mg total) by mouth 3 (three) times daily as needed (anxiety).    Dispense:  60 tablet    Refill:  1    Supervising Provider:   ARFEEN, SYED T [[id number]]   mirtazapine  (REMERON ) 30 MG tablet    Sig: Take 0.5 tablets (15 mg total) by mouth at bedtime.    Dispense:  30 tablet    Refill:  1    Supervising Provider:   ARFEEN, SYED T [[id number]]   propranolol  (INDERAL ) 20 MG tablet    Sig: Take 0.5 tablets (10 mg total) by mouth 3 (three) times daily as needed (anxiety).    Dispense:  90 tablet    Refill:  1    Supervising Provider:   CURRY PATERSON T [[id number]]

## 2024-12-16 NOTE — Telephone Encounter (Signed)
 Patient called and lmom stating she is calling about the medication provider have on to calm her down is not working and she needs her Ativan  back. Per pt the current medication provider have her on for her to calm her down does not do anything for her. Per pt she needs her Ativan  back to help her sleep at night and needs the 2 a day. Per pt she is going through something right now and she's very ill and she's very irritated and nerves are on edge. Per pt If someone to call her back she will really fucking appreciate it and then at the and stated excuse her language.

## 2024-12-19 NOTE — Telephone Encounter (Signed)
 lmom

## 2024-12-26 ENCOUNTER — Telehealth (HOSPITAL_COMMUNITY): Admitting: Registered Nurse

## 2025-01-02 ENCOUNTER — Telehealth (HOSPITAL_COMMUNITY): Admitting: Registered Nurse

## 2025-01-06 ENCOUNTER — Telehealth (HOSPITAL_COMMUNITY): Admitting: Registered Nurse

## 2025-01-06 ENCOUNTER — Encounter (HOSPITAL_COMMUNITY): Payer: Self-pay | Admitting: Registered Nurse

## 2025-01-06 DIAGNOSIS — F331 Major depressive disorder, recurrent, moderate: Secondary | ICD-10-CM

## 2025-01-06 DIAGNOSIS — F4310 Post-traumatic stress disorder, unspecified: Secondary | ICD-10-CM

## 2025-01-06 DIAGNOSIS — Z79899 Other long term (current) drug therapy: Secondary | ICD-10-CM

## 2025-01-06 DIAGNOSIS — G4700 Insomnia, unspecified: Secondary | ICD-10-CM

## 2025-01-06 DIAGNOSIS — F410 Panic disorder [episodic paroxysmal anxiety] without agoraphobia: Secondary | ICD-10-CM

## 2025-01-06 MED ORDER — QUETIAPINE FUMARATE 25 MG PO TABS: 25.0000 mg | ORAL_TABLET | Freq: Three times a day (TID) | ORAL | 1 refills | Status: AC | PRN

## 2025-01-06 MED ORDER — BREXPIPRAZOLE 1 MG PO TABS: 1.0000 mg | ORAL_TABLET | Freq: Every day | ORAL | 1 refills | Status: AC

## 2025-01-06 MED ORDER — MIRTAZAPINE 30 MG PO TABS: 15.0000 mg | ORAL_TABLET | Freq: Every day | ORAL | 1 refills | Status: AC

## 2025-01-06 MED ORDER — PROPRANOLOL HCL 10 MG PO TABS: 10.0000 mg | ORAL_TABLET | Freq: Three times a day (TID) | ORAL | 1 refills | Status: AC | PRN

## 2025-01-06 MED ORDER — DULOXETINE HCL 60 MG PO CPEP: 60.0000 mg | ORAL_CAPSULE | Freq: Every day | ORAL | 1 refills | Status: AC

## 2025-01-06 MED ORDER — QUETIAPINE FUMARATE ER 50 MG PO TB24: 50.0000 mg | ORAL_TABLET | Freq: Every day | ORAL | 1 refills | Status: AC

## 2025-01-15 ENCOUNTER — Institutional Professional Consult (permissible substitution): Admitting: Plastic Surgery

## 2025-01-16 ENCOUNTER — Institutional Professional Consult (permissible substitution): Admitting: Plastic Surgery

## 2025-02-06 ENCOUNTER — Telehealth (HOSPITAL_COMMUNITY): Admitting: Registered Nurse
